# Patient Record
Sex: Male | Born: 1975 | Race: Black or African American | Hispanic: No | Marital: Single | Smoking: Current some day smoker
Health system: Southern US, Community
[De-identification: ages and names within clinical notes are randomized; demographics above are authoritative.]

## PROBLEM LIST (undated history)

## (undated) DIAGNOSIS — I10 Essential (primary) hypertension: Secondary | ICD-10-CM

## (undated) DIAGNOSIS — I499 Cardiac arrhythmia, unspecified: Secondary | ICD-10-CM

## (undated) DIAGNOSIS — N183 Chronic kidney disease, stage 3 unspecified: Secondary | ICD-10-CM

## (undated) DIAGNOSIS — F17200 Nicotine dependence, unspecified, uncomplicated: Secondary | ICD-10-CM

## (undated) DIAGNOSIS — F1410 Cocaine abuse, uncomplicated: Secondary | ICD-10-CM

## (undated) DIAGNOSIS — J45909 Unspecified asthma, uncomplicated: Secondary | ICD-10-CM

## (undated) DIAGNOSIS — I509 Heart failure, unspecified: Secondary | ICD-10-CM

## (undated) DIAGNOSIS — K5792 Diverticulitis of intestine, part unspecified, without perforation or abscess without bleeding: Secondary | ICD-10-CM

## (undated) HISTORY — DX: Cardiac arrhythmia, unspecified: I49.9

## (undated) HISTORY — DX: Heart failure, unspecified: I50.9

## (undated) HISTORY — PX: ANKLE SURGERY: SHX546

---

## 2003-10-22 ENCOUNTER — Emergency Department (HOSPITAL_COMMUNITY): Admission: EM | Admit: 2003-10-22 | Discharge: 2003-10-22 | Payer: Self-pay | Admitting: Emergency Medicine

## 2004-04-21 ENCOUNTER — Emergency Department (HOSPITAL_COMMUNITY): Admission: EM | Admit: 2004-04-21 | Discharge: 2004-04-21 | Payer: Self-pay | Admitting: Emergency Medicine

## 2004-05-25 ENCOUNTER — Other Ambulatory Visit: Payer: Self-pay

## 2004-05-25 ENCOUNTER — Emergency Department: Payer: Self-pay | Admitting: Emergency Medicine

## 2004-06-18 ENCOUNTER — Emergency Department (HOSPITAL_COMMUNITY): Admission: EM | Admit: 2004-06-18 | Discharge: 2004-06-18 | Payer: Self-pay | Admitting: *Deleted

## 2004-06-20 ENCOUNTER — Emergency Department (HOSPITAL_COMMUNITY): Admission: EM | Admit: 2004-06-20 | Discharge: 2004-06-20 | Payer: Self-pay | Admitting: Family Medicine

## 2004-08-22 ENCOUNTER — Emergency Department: Payer: Self-pay | Admitting: Emergency Medicine

## 2005-07-07 ENCOUNTER — Emergency Department: Payer: Self-pay | Admitting: Emergency Medicine

## 2006-01-06 ENCOUNTER — Inpatient Hospital Stay: Payer: Self-pay | Admitting: Internal Medicine

## 2007-12-09 ENCOUNTER — Emergency Department: Payer: Self-pay | Admitting: Emergency Medicine

## 2007-12-15 ENCOUNTER — Emergency Department: Payer: Self-pay | Admitting: Emergency Medicine

## 2007-12-24 ENCOUNTER — Ambulatory Visit: Payer: Self-pay | Admitting: Unknown Physician Specialty

## 2008-02-27 ENCOUNTER — Other Ambulatory Visit: Payer: Self-pay

## 2008-02-27 ENCOUNTER — Emergency Department: Payer: Self-pay | Admitting: Emergency Medicine

## 2008-09-26 ENCOUNTER — Emergency Department: Payer: Self-pay | Admitting: Internal Medicine

## 2009-01-12 ENCOUNTER — Emergency Department: Payer: Self-pay | Admitting: Unknown Physician Specialty

## 2009-06-04 ENCOUNTER — Emergency Department: Payer: Self-pay | Admitting: Emergency Medicine

## 2010-06-06 ENCOUNTER — Emergency Department: Payer: Self-pay | Admitting: Emergency Medicine

## 2010-06-08 ENCOUNTER — Emergency Department: Payer: Self-pay | Admitting: Emergency Medicine

## 2012-03-23 ENCOUNTER — Emergency Department: Payer: Self-pay | Admitting: Emergency Medicine

## 2012-03-23 LAB — URINALYSIS, COMPLETE
Bacteria: NONE SEEN
Bilirubin,UR: NEGATIVE
Blood: NEGATIVE
Glucose,UR: NEGATIVE mg/dL (ref 0–75)
Leukocyte Esterase: NEGATIVE
RBC,UR: 1 /HPF (ref 0–5)
Squamous Epithelial: <1

## 2014-09-02 ENCOUNTER — Emergency Department: Payer: Self-pay | Admitting: Emergency Medicine

## 2015-05-23 ENCOUNTER — Emergency Department
Admission: EM | Admit: 2015-05-23 | Discharge: 2015-05-23 | Disposition: A | Discharge status: Home / Self Care | Payer: Self-pay | Attending: Emergency Medicine | Admitting: Emergency Medicine

## 2015-05-23 ENCOUNTER — Encounter: Payer: Self-pay | Admitting: Emergency Medicine

## 2015-05-23 DIAGNOSIS — K5732 Diverticulitis of large intestine without perforation or abscess without bleeding: Secondary | ICD-10-CM | POA: Insufficient documentation

## 2015-05-23 DIAGNOSIS — Z720 Tobacco use: Secondary | ICD-10-CM | POA: Insufficient documentation

## 2015-05-23 HISTORY — DX: Diverticulitis of intestine, part unspecified, without perforation or abscess without bleeding: K57.92

## 2015-05-23 LAB — COMPREHENSIVE METABOLIC PANEL
ALBUMIN: 4.1 g/dL (ref 3.5–5.0)
ALK PHOS: 67 U/L (ref 38–126)
ALT: 15 U/L — AB (ref 17–63)
AST: 18 U/L (ref 15–41)
Anion gap: 9 (ref 5–15)
BUN: 15 mg/dL (ref 6–20)
CALCIUM: 9.2 mg/dL (ref 8.9–10.3)
CHLORIDE: 106 mmol/L (ref 101–111)
CO2: 24 mmol/L (ref 22–32)
CREATININE: 1.44 mg/dL — AB (ref 0.61–1.24)
GFR calc Af Amer: >60 mL/min (ref >60)
GFR calc non Af Amer: 60 mL/min — ABNORMAL LOW (ref >60)
GLUCOSE: 134 mg/dL — AB (ref 65–99)
Potassium: 4.3 mmol/L (ref 3.5–5.1)
SODIUM: 139 mmol/L (ref 135–145)
Total Bilirubin: 0.7 mg/dL (ref 0.3–1.2)
Total Protein: 7.4 g/dL (ref 6.5–8.1)

## 2015-05-23 LAB — LIPASE, BLOOD: LIPASE: 27 U/L (ref 22–51)

## 2015-05-23 LAB — CBC
HCT: 44.8 % (ref 40.0–52.0)
Hemoglobin: 14.7 g/dL (ref 13.0–18.0)
MCH: 26.9 pg (ref 26.0–34.0)
MCHC: 32.8 g/dL (ref 32.0–36.0)
MCV: 82 fL (ref 80.0–100.0)
PLATELETS: 232 10*3/uL (ref 150–440)
RBC: 5.47 MIL/uL (ref 4.40–5.90)
RDW: 14.7 % — ABNORMAL HIGH (ref 11.5–14.5)
WBC: 10.8 10*3/uL — ABNORMAL HIGH (ref 3.8–10.6)

## 2015-05-23 MED ORDER — CIPROFLOXACIN HCL 500 MG PO TABS
500.0000 mg | ORAL_TABLET | Freq: Two times a day (BID) | ORAL | Status: AC
Start: 2015-05-23 — End: 2015-06-02

## 2015-05-23 MED ORDER — OXYCODONE-ACETAMINOPHEN 5-325 MG PO TABS: 2.0000 | ORAL_TABLET | Freq: Four times a day (QID) | ORAL | Status: DC | PRN

## 2015-05-23 MED ORDER — METRONIDAZOLE 500 MG PO TABS
ORAL_TABLET | ORAL | Status: AC
  Administered 2015-05-23: 500 mg via ORAL
  Filled 2015-05-23: qty 1

## 2015-05-23 MED ORDER — CIPROFLOXACIN HCL 500 MG PO TABS
500.0000 mg | ORAL_TABLET | Freq: Once | ORAL | Status: AC
  Administered 2015-05-23: 500 mg via ORAL
  Filled 2015-05-23: qty 1

## 2015-05-23 MED ORDER — METRONIDAZOLE 500 MG PO TABS
500.0000 mg | ORAL_TABLET | Freq: Once | ORAL | Status: AC
  Administered 2015-05-23: 500 mg via ORAL

## 2015-05-23 MED ORDER — KETOROLAC TROMETHAMINE 60 MG/2ML IM SOLN
INTRAMUSCULAR | Status: AC
  Administered 2015-05-23: 60 mg via INTRAMUSCULAR
  Filled 2015-05-23: qty 2

## 2015-05-23 MED ORDER — KETOROLAC TROMETHAMINE 60 MG/2ML IM SOLN
60.0000 mg | Freq: Once | INTRAMUSCULAR | Status: AC
  Administered 2015-05-23: 60 mg via INTRAMUSCULAR

## 2015-05-23 MED ORDER — METRONIDAZOLE 500 MG PO TABS: 500.0000 mg | ORAL_TABLET | Freq: Two times a day (BID) | ORAL | Status: DC

## 2015-05-23 NOTE — ED Notes (Signed)
Pt c/o LLQ pain for two days now, states it is consistent with his diverticulitis pain. Denies N, V, D.

## 2015-05-23 NOTE — Discharge Instructions (Signed)

## 2015-05-23 NOTE — ED Provider Notes (Signed)
[facility] Emergency Department Provider Note     Time seen: ----------------------------------------- 3:58 PM on 05/23/2015 -----------------------------------------    I have reviewed the triage vital signs and the nursing notes.   HISTORY  Chief Complaint Abdominal Pain    HPI Brad [name] is a 39 y.o. male who presents ER with dull left lower quadrant pain for last 2 days. Nothing makes his symptoms worse. Patient states this is consistent with his previous diverticulitis, denies any fevers, chills, nausea vomiting or diarrhea. Patient is not had blood in his stool.   Past Medical History  Diagnosis Date  . Diverticulitis     There are no active problems to display for this patient.   Past Surgical History  Procedure Laterality Date  . Ankle surgery      Allergies Review of patient's allergies indicates no known allergies.  Social History Social History  Substance Use Topics  . Smoking status: Current Every Day Smoker -- 0.50 packs/day    Types: Cigarettes  . Smokeless tobacco: None  . Alcohol Use: Yes     Comment: occas    Review of Systems Constitutional: Negative for fever. Eyes: Negative for visual changes. ENT: Negative for sore throat. Cardiovascular: Negative for chest pain. Respiratory: Negative for shortness of breath. Gastrointestinal: Positive for abdominal pain Genitourinary: Negative for dysuria. Musculoskeletal: Negative for back pain. Skin: Negative for rash. Neurological: Negative for headaches, focal weakness or numbness.  10-point ROS otherwise negative.  ____________________________________________   PHYSICAL EXAM:  VITAL SIGNS: ED Triage Vitals  Enc Vitals Group     BP 05/23/15 1256 161/91 mmHg     Pulse Rate 05/23/15 1256 89     Resp 05/23/15 1256 17     Temp 05/23/15 1256 98.3 F (36.8 C)     Temp Source 05/23/15 1256 Oral     SpO2 05/23/15 1256 96 %     Weight 05/23/15 1256 260 lb  (117.935 kg)     Height 05/23/15 1256  (1.651 m)     Head Cir --      Peak Flow --      Pain Score 05/23/15 1301 6     Pain Loc --      Pain Edu? --      Excl. in GC? --     Constitutional: Alert and oriented. Well appearing and in no distress. Eyes: Conjunctivae are normal. PERRL. Normal extraocular movements. ENT   Head: Normocephalic and atraumatic.   Nose: No congestion/rhinnorhea.   Mouth/Throat: Mucous membranes are moist.   Neck: No stridor. Cardiovascular: Normal rate, regular rhythm. Normal and symmetric distal pulses are present in all extremities. No murmurs, rubs, or gallops. Respiratory: Normal respiratory effort without tachypnea nor retractions. Breath sounds are clear and equal bilaterally. No wheezes/rales/rhonchi. Gastrointestinal: The moderate left flank and left lower quadrant tenderness, no rebound or guarding. Normal bowel sounds. Musculoskeletal: Nontender with normal range of motion in all extremities. No joint effusions.  No lower extremity tenderness nor edema. Neurologic:  Normal speech and language. No gross focal neurologic deficits are appreciated. Speech is normal. No gait instability. Skin:  Skin is warm, dry and intact. No rash noted. Psychiatric: Mood and affect are normal. Speech and behavior are normal. Patient exhibits appropriate insight and judgment.  ____________________________________________  ED COURSE:  Pertinent labs & imaging results that were available during my care of the patient were reviewed by me and considered in my medical decision making (see chart for details). Patient is in  no acute distress, likely mild diverticulitis. He'll receive Cipro Flagyl and pain medication. Is encouraged to have close follow-up with his doctor for reevaluation ____________________________________________    LABS (pertinent positives/negatives)  Labs Reviewed  COMPREHENSIVE METABOLIC PANEL - Abnormal; Notable for the following:     Glucose, Bld 134 (*)    Creatinine, Ser 1.44 (*)    ALT 15 (*)    GFR calc non Af Amer 60 (*)    All other components within normal limits  CBC - Abnormal; Notable for the following:    WBC 10.8 (*)    RDW 14.7 (*)    All other components within normal limits  LIPASE, BLOOD  URINALYSIS COMPLETEWITH MICROSCOPIC ([facility] ONLY)    ____________________________________________  FINAL ASSESSMENT AND PLAN  Diverticulitis  Plan: Patient with labs and imaging as dictated above. Patient with very mild leukocytosis, has been started on Cipro and Flagyl here. He was given Toradol IM for pain. He is discharged with Cipro Flagyl and Percocet.   Emily Filbert, MD   Emily Filbert, MD 05/23/15 1600

## 2015-05-29 ENCOUNTER — Emergency Department
Admission: EM | Admit: 2015-05-29 | Discharge: 2015-05-29 | Disposition: A | Discharge status: Home / Self Care | Payer: Self-pay | Attending: Emergency Medicine | Admitting: Emergency Medicine

## 2015-05-29 ENCOUNTER — Emergency Department: Payer: Self-pay

## 2015-05-29 DIAGNOSIS — Z720 Tobacco use: Secondary | ICD-10-CM | POA: Insufficient documentation

## 2015-05-29 DIAGNOSIS — K5732 Diverticulitis of large intestine without perforation or abscess without bleeding: Secondary | ICD-10-CM | POA: Insufficient documentation

## 2015-05-29 LAB — URINALYSIS COMPLETE WITH MICROSCOPIC (ARMC ONLY)
BILIRUBIN URINE: NEGATIVE
Bacteria, UA: NONE SEEN
GLUCOSE, UA: NEGATIVE mg/dL
HGB URINE DIPSTICK: NEGATIVE
Leukocytes, UA: NEGATIVE
NITRITE: NEGATIVE
Protein, ur: 100 mg/dL — AB
RBC / HPF: NONE SEEN RBC/hpf (ref 0–5)
Specific Gravity, Urine: 1.021 (ref 1.005–1.030)
Squamous Epithelial / LPF: NONE SEEN
pH: 8 (ref 5.0–8.0)

## 2015-05-29 LAB — COMPREHENSIVE METABOLIC PANEL
ALK PHOS: 61 U/L (ref 38–126)
ALT: 13 U/L — ABNORMAL LOW (ref 17–63)
ANION GAP: 10 (ref 5–15)
AST: 19 U/L (ref 15–41)
Albumin: 4.4 g/dL (ref 3.5–5.0)
BILIRUBIN TOTAL: 0.7 mg/dL (ref 0.3–1.2)
BUN: 16 mg/dL (ref 6–20)
CALCIUM: 9.5 mg/dL (ref 8.9–10.3)
CO2: 25 mmol/L (ref 22–32)
Chloride: 105 mmol/L (ref 101–111)
Creatinine, Ser: 1.51 mg/dL — ABNORMAL HIGH (ref 0.61–1.24)
GFR calc non Af Amer: 57 mL/min — ABNORMAL LOW (ref >60)
Glucose, Bld: 134 mg/dL — ABNORMAL HIGH (ref 65–99)
Potassium: 3.7 mmol/L (ref 3.5–5.1)
Sodium: 140 mmol/L (ref 135–145)
TOTAL PROTEIN: 8.2 g/dL — AB (ref 6.5–8.1)

## 2015-05-29 LAB — CBC
HCT: 47.3 % (ref 40.0–52.0)
HEMOGLOBIN: 15.3 g/dL (ref 13.0–18.0)
MCH: 26.7 pg (ref 26.0–34.0)
MCHC: 32.5 g/dL (ref 32.0–36.0)
MCV: 82.1 fL (ref 80.0–100.0)
Platelets: 288 10*3/uL (ref 150–440)
RBC: 5.76 MIL/uL (ref 4.40–5.90)
RDW: 14.8 % — ABNORMAL HIGH (ref 11.5–14.5)
WBC: 8.2 10*3/uL (ref 3.8–10.6)

## 2015-05-29 LAB — LIPASE, BLOOD: Lipase: 19 U/L — ABNORMAL LOW (ref 22–51)

## 2015-05-29 MED ORDER — SODIUM CHLORIDE 0.9 % IV SOLN
Freq: Once | INTRAVENOUS | Status: AC
  Administered 2015-05-29: 18:00:00 via INTRAVENOUS

## 2015-05-29 MED ORDER — CIPROFLOXACIN HCL 500 MG PO TABS: 500.0000 mg | ORAL_TABLET | Freq: Two times a day (BID) | ORAL | Status: AC

## 2015-05-29 MED ORDER — ONDANSETRON HCL 4 MG/2ML IJ SOLN
4.0000 mg | Freq: Once | INTRAMUSCULAR | Status: AC
  Administered 2015-05-29: 4 mg via INTRAVENOUS

## 2015-05-29 MED ORDER — HYDROMORPHONE HCL 1 MG/ML IJ SOLN: 1.0000 mg | Freq: Once | INTRAMUSCULAR | Status: DC

## 2015-05-29 MED ORDER — HYDROMORPHONE HCL 1 MG/ML IJ SOLN
1.0000 mg | Freq: Once | INTRAMUSCULAR | Status: AC
  Administered 2015-05-29: 1 mg via INTRAVENOUS

## 2015-05-29 MED ORDER — CIPROFLOXACIN HCL 500 MG PO TABS
500.0000 mg | ORAL_TABLET | Freq: Once | ORAL | Status: AC
  Administered 2015-05-29: 500 mg via ORAL
  Filled 2015-05-29: qty 1

## 2015-05-29 MED ORDER — HYDROMORPHONE HCL 1 MG/ML IJ SOLN
INTRAMUSCULAR | Status: AC
  Administered 2015-05-29: 1 mg via INTRAVENOUS
  Filled 2015-05-29: qty 1

## 2015-05-29 MED ORDER — METRONIDAZOLE 500 MG PO TABS: 500.0000 mg | ORAL_TABLET | Freq: Two times a day (BID) | ORAL | Status: DC

## 2015-05-29 MED ORDER — METRONIDAZOLE 500 MG PO TABS
500.0000 mg | ORAL_TABLET | Freq: Once | ORAL | Status: AC
  Administered 2015-05-29: 500 mg via ORAL
  Filled 2015-05-29: qty 1

## 2015-05-29 MED ORDER — OXYCODONE-ACETAMINOPHEN 5-325 MG PO TABS: 2.0000 | ORAL_TABLET | Freq: Four times a day (QID) | ORAL | Status: DC | PRN

## 2015-05-29 MED ORDER — ONDANSETRON HCL 4 MG/2ML IJ SOLN
INTRAMUSCULAR | Status: AC
  Filled 2015-05-29: qty 2

## 2015-05-29 NOTE — Discharge Instructions (Signed)
Diverticulitis °Diverticulitis is inflammation or infection of small pouches in your colon that form when you have a condition called diverticulosis. The pouches in your colon are called diverticula. Your colon, or large intestine, is where water is absorbed and stool is formed. °Complications of diverticulitis can include: °· Bleeding. °· Severe infection. °· Severe pain. °· Perforation of your colon. °· Obstruction of your colon. °CAUSES  °Diverticulitis is caused by bacteria. °Diverticulitis happens when stool becomes trapped in diverticula. This allows bacteria to grow in the diverticula, which can lead to inflammation and infection. °RISK FACTORS °People with diverticulosis are at risk for diverticulitis. Eating a diet that does not include enough fiber from fruits and vegetables may make diverticulitis more likely to develop. °SYMPTOMS  °Symptoms of diverticulitis may include: °· Abdominal pain and tenderness. The pain is normally located on the left side of the abdomen, but may occur in other areas. °· Fever and chills. °· Bloating. °· Cramping. °· Nausea. °· Vomiting. °· Constipation. °· Diarrhea. °· Blood in your stool. °DIAGNOSIS  °Your health care provider will ask you about your medical history and do a physical exam. You may need to have tests done because many medical conditions can cause the same symptoms as diverticulitis. Tests may include: °· Blood tests. °· Urine tests. °· Imaging tests of the abdomen, including X-rays and CT scans. °When your condition is under control, your health care provider may recommend that you have a colonoscopy. A colonoscopy can show how severe your diverticula are and whether something else is causing your symptoms. °TREATMENT  °Most cases of diverticulitis are mild and can be treated at home. Treatment may include: °· Taking over-the-counter pain medicines. °· Following a clear liquid diet. °· Taking antibiotic medicines by mouth for 7-10 days. °More severe cases may  be treated at a hospital. Treatment may include: °· Not eating or drinking. °· Taking prescription pain medicine. °· Receiving antibiotic medicines through an IV tube. °· Receiving fluids and nutrition through an IV tube. °· Surgery. °HOME CARE INSTRUCTIONS  °· Follow your health care provider's instructions carefully. °· Follow a full liquid diet or other diet as directed by your health care provider. After your symptoms improve, your health care provider may tell you to change your diet. He or she may recommend you eat a high-fiber diet. Fruits and vegetables are good sources of fiber. Fiber makes it easier to pass stool. °· Take fiber supplements or probiotics as directed by your health care provider. °· Only take medicines as directed by your health care provider. °· Keep all your follow-up appointments. °SEEK MEDICAL CARE IF:  °· Your pain does not improve. °· You have a hard time eating food. °· Your bowel movements do not return to normal. °SEEK IMMEDIATE MEDICAL CARE IF:  °· Your pain becomes worse. °· Your symptoms do not get better. °· Your symptoms suddenly get worse. °· You have a fever. °· You have repeated vomiting. °· You have bloody or black, tarry stools. °MAKE SURE YOU:  °· Understand these instructions. °· Will watch your condition. °· Will get help right away if you are not doing well or get worse. °  °This information is not intended to replace advice given to you by your health care provider. Make sure you discuss any questions you have with your health care provider. °  °Document Released: 05/21/2005 Document Revised: 08/16/2013 Document Reviewed: 07/06/2013 °Elsevier Interactive Patient Education ©2016 [employer] ° °

## 2015-05-29 NOTE — ED Notes (Signed)
Pt c/o LLQ pain since last week and was dx with diverticulitis and rx abx and pain meds, states he was only able to afford the pain meds and will pick up the abx tomorrow..states the pain has worsened with N/V since last night.

## 2015-05-29 NOTE — ED Provider Notes (Signed)
[facility] Emergency Department Provider Note     Time seen: ----------------------------------------- 5:42 PM on 05/29/2015 -----------------------------------------    I have reviewed the triage vital signs and the nursing notes.   HISTORY  Chief Complaint Abdominal Pain    HPI Brad [name] is a 39 y.o. male who presents to ER with left flank pain. Patient was diagnosed by me about a week ago with diverticulitis. He was prescribed antibiotics and pain medication. Patient states he couldn't afford the antibiotics because there were $35, so he fill the pain medication. Patient states he was given a pickup antibiotics tomorrow, states pain worsened last night with nausea and vomiting.   Past Medical History  Diagnosis Date  . Diverticulitis     There are no active problems to display for this patient.   Past Surgical History  Procedure Laterality Date  . Ankle surgery      Allergies Review of patient's allergies indicates no known allergies.  Social History Social History  Substance Use Topics  . Smoking status: Current Every Day Smoker -- 0.50 packs/day    Types: Cigarettes  . Smokeless tobacco: None  . Alcohol Use: Yes     Comment: occas    Review of Systems Constitutional: Negative for fever. Eyes: Negative for visual changes. ENT: Negative for sore throat. Cardiovascular: Negative for chest pain. Respiratory: Negative for shortness of breath. Gastrointestinal: Positive for abdominal pain and vomiting Genitourinary: Negative for dysuria. Musculoskeletal: Negative for back pain. Skin: Negative for rash. Neurological: Negative for headaches, focal weakness or numbness.  10-point ROS otherwise negative.  ____________________________________________   PHYSICAL EXAM:  VITAL SIGNS: ED Triage Vitals  Enc Vitals Group     BP 05/29/15 1640 204/104 mmHg     Pulse Rate 05/29/15 1640 82     Resp 05/29/15 1640 18     Temp  05/29/15 1640 98.1 F (36.7 C)     Temp Source 05/29/15 1640 Oral     SpO2 05/29/15 1640 97 %     Weight 05/29/15 1640 260 lb (117.935 kg)     Height 05/29/15 1640  (1.753 m)     Head Cir --      Peak Flow --      Pain Score 05/29/15 1641 10     Pain Loc --      Pain Edu? --      Excl. in GC? --     Constitutional: Alert and oriented. Well appearing and in no distress. Eyes: Conjunctivae are normal. PERRL. Normal extraocular movements. ENT   Head: Normocephalic and atraumatic.   Nose: No congestion/rhinnorhea.   Mouth/Throat: Mucous membranes are moist.   Neck: No stridor. Cardiovascular: Normal rate, regular rhythm. Normal and symmetric distal pulses are present in all extremities. No murmurs, rubs, or gallops. Respiratory: Normal respiratory effort without tachypnea nor retractions. Breath sounds are clear and equal bilaterally. No wheezes/rales/rhonchi. Gastrointestinal: Soft and nontender. No distention. No abdominal bruits.  Musculoskeletal: Nontender with normal range of motion in all extremities. No joint effusions.  No lower extremity tenderness nor edema. Neurologic:  Normal speech and language. No gross focal neurologic deficits are appreciated. Speech is normal. No gait instability. Skin:  Skin is warm, dry and intact. No rash noted. Psychiatric: Mood and affect are normal. Speech and behavior are normal. Patient exhibits appropriate insight and judgment.  ____________________________________________  ED COURSE:  Pertinent labs & imaging results that were available during my care of the patient were reviewed by me and  considered in my medical decision making (see chart for details). We'll check basic abdominal labs, consider imaging at this point. He'll receive pain medication as well. ____________________________________________    LABS (pertinent positives/negatives)  Labs Reviewed  LIPASE, BLOOD - Abnormal; Notable for the following:    Lipase  19 (*)    All other components within normal limits  COMPREHENSIVE METABOLIC PANEL - Abnormal; Notable for the following:    Glucose, Bld 134 (*)    Creatinine, Ser 1.51 (*)    Total Protein 8.2 (*)    ALT 13 (*)    GFR calc non Af Amer 57 (*)    All other components within normal limits  CBC - Abnormal; Notable for the following:    RDW 14.8 (*)    All other components within normal limits  URINALYSIS COMPLETEWITH MICROSCOPIC ([facility] ONLY) - Abnormal; Notable for the following:    Color, Urine YELLOW (*)    APPearance CLOUDY (*)    Ketones, ur TRACE (*)    Protein, ur 100 (*)    All other components within normal limits    RADIOLOGY Images were viewed by me  CT renal protocol IMPRESSION: Descending colon diverticulitis. ____________________________________________  FINAL ASSESSMENT AND PLAN  Flank pain, diverticulitis  Plan: Patient with labs and imaging as dictated above. Patient will be represcribed Cipro and Flagyl. He was given his first dose tonight. Be discharged with Cipro Flagyl and Percocet and advised to follow-up with not improving.   Emily Filbert, MD   Emily Filbert, MD 05/29/15 [phone]

## 2015-05-30 ENCOUNTER — Inpatient Hospital Stay
Admission: EM | Admit: 2015-05-30 | Discharge: 2015-05-31 | DRG: 894 | Discharge status: Against Medical Advice | Payer: Self-pay | Attending: Internal Medicine | Admitting: Internal Medicine

## 2015-05-30 ENCOUNTER — Encounter: Payer: Self-pay | Admitting: *Deleted

## 2015-05-30 DIAGNOSIS — Y929 Unspecified place or not applicable: Secondary | ICD-10-CM

## 2015-05-30 DIAGNOSIS — I129 Hypertensive chronic kidney disease with stage 1 through stage 4 chronic kidney disease, or unspecified chronic kidney disease: Secondary | ICD-10-CM | POA: Diagnosis present

## 2015-05-30 DIAGNOSIS — D72829 Elevated white blood cell count, unspecified: Secondary | ICD-10-CM | POA: Diagnosis present

## 2015-05-30 DIAGNOSIS — T40605A Adverse effect of unspecified narcotics, initial encounter: Secondary | ICD-10-CM | POA: Diagnosis present

## 2015-05-30 DIAGNOSIS — F1590 Other stimulant use, unspecified, uncomplicated: Secondary | ICD-10-CM | POA: Diagnosis present

## 2015-05-30 DIAGNOSIS — N19 Unspecified kidney failure: Secondary | ICD-10-CM

## 2015-05-30 DIAGNOSIS — F1410 Cocaine abuse, uncomplicated: Secondary | ICD-10-CM | POA: Diagnosis present

## 2015-05-30 DIAGNOSIS — K5732 Diverticulitis of large intestine without perforation or abscess without bleeding: Secondary | ICD-10-CM | POA: Diagnosis present

## 2015-05-30 DIAGNOSIS — N179 Acute kidney failure, unspecified: Secondary | ICD-10-CM | POA: Diagnosis present

## 2015-05-30 DIAGNOSIS — F1123 Opioid dependence with withdrawal: Principal | ICD-10-CM | POA: Diagnosis present

## 2015-05-30 DIAGNOSIS — I159 Secondary hypertension, unspecified: Secondary | ICD-10-CM

## 2015-05-30 DIAGNOSIS — R112 Nausea with vomiting, unspecified: Secondary | ICD-10-CM | POA: Diagnosis present

## 2015-05-30 DIAGNOSIS — K5903 Drug induced constipation: Secondary | ICD-10-CM | POA: Diagnosis present

## 2015-05-30 DIAGNOSIS — R109 Unspecified abdominal pain: Secondary | ICD-10-CM

## 2015-05-30 DIAGNOSIS — J45909 Unspecified asthma, uncomplicated: Secondary | ICD-10-CM | POA: Diagnosis present

## 2015-05-30 DIAGNOSIS — F17210 Nicotine dependence, cigarettes, uncomplicated: Secondary | ICD-10-CM | POA: Diagnosis present

## 2015-05-30 DIAGNOSIS — G253 Myoclonus: Secondary | ICD-10-CM | POA: Diagnosis present

## 2015-05-30 LAB — CBC WITH DIFFERENTIAL/PLATELET
BASOS ABS: 0.1 10*3/uL (ref 0–0.1)
BASOS PCT: 1 %
Eosinophils Absolute: 0 10*3/uL (ref 0–0.7)
Eosinophils Relative: 0 %
HEMATOCRIT: 48.9 % (ref 40.0–52.0)
HEMOGLOBIN: 15.6 g/dL (ref 13.0–18.0)
Lymphocytes Relative: 13 %
Lymphs Abs: 1.7 10*3/uL (ref 1.0–3.6)
MCH: 26.3 pg (ref 26.0–34.0)
MCHC: 31.9 g/dL — ABNORMAL LOW (ref 32.0–36.0)
MCV: 82.5 fL (ref 80.0–100.0)
Monocytes Absolute: 0.7 10*3/uL (ref 0.2–1.0)
Monocytes Relative: 6 %
NEUTROS ABS: 10 10*3/uL — AB (ref 1.4–6.5)
NEUTROS PCT: 80 %
Platelets: 285 10*3/uL (ref 150–440)
RBC: 5.94 MIL/uL — ABNORMAL HIGH (ref 4.40–5.90)
RDW: 14.7 % — ABNORMAL HIGH (ref 11.5–14.5)
WBC: 12.5 10*3/uL — ABNORMAL HIGH (ref 3.8–10.6)

## 2015-05-30 LAB — COMPREHENSIVE METABOLIC PANEL
ALK PHOS: 66 U/L (ref 38–126)
ALT: 12 U/L — ABNORMAL LOW (ref 17–63)
ANION GAP: 8 (ref 5–15)
AST: 16 U/L (ref 15–41)
Albumin: 4.1 g/dL (ref 3.5–5.0)
BILIRUBIN TOTAL: 1 mg/dL (ref 0.3–1.2)
BUN: 12 mg/dL (ref 6–20)
CALCIUM: 8.8 mg/dL — AB (ref 8.9–10.3)
CO2: 25 mmol/L (ref 22–32)
Chloride: 103 mmol/L (ref 101–111)
Creatinine, Ser: 1.42 mg/dL — ABNORMAL HIGH (ref 0.61–1.24)
GFR calc non Af Amer: >60 mL/min (ref >60)
Glucose, Bld: 117 mg/dL — ABNORMAL HIGH (ref 65–99)
Potassium: 3.9 mmol/L (ref 3.5–5.1)
SODIUM: 136 mmol/L (ref 135–145)
TOTAL PROTEIN: 8 g/dL (ref 6.5–8.1)

## 2015-05-30 LAB — TROPONIN I: Troponin I: <0.03 ng/mL (ref <0.031)

## 2015-05-30 MED ORDER — PROMETHAZINE HCL 25 MG PO TABS: 25.0000 mg | ORAL_TABLET | Freq: Four times a day (QID) | ORAL | Status: DC | PRN

## 2015-05-30 MED ORDER — CLONIDINE HCL 0.1 MG PO TABS
ORAL_TABLET | ORAL | Status: AC
  Administered 2015-05-30: 0.1 mg via ORAL
  Filled 2015-05-30: qty 1

## 2015-05-30 MED ORDER — CLONIDINE HCL 0.1 MG PO TABS
0.1000 mg | ORAL_TABLET | Freq: Once | ORAL | Status: AC
  Administered 2015-05-30: 0.1 mg via ORAL

## 2015-05-30 MED ORDER — ONDANSETRON HCL 4 MG/2ML IJ SOLN
4.0000 mg | Freq: Once | INTRAMUSCULAR | Status: AC
  Administered 2015-05-30: 4 mg via INTRAVENOUS
  Filled 2015-05-30: qty 2

## 2015-05-30 MED ORDER — ONDANSETRON 8 MG PO TBDP
8.0000 mg | ORAL_TABLET | Freq: Once | ORAL | Status: AC
  Administered 2015-05-30: 8 mg via ORAL

## 2015-05-30 MED ORDER — PROMETHAZINE HCL 25 MG/ML IJ SOLN
INTRAMUSCULAR | Status: AC
  Administered 2015-05-30: 12.5 mg via INTRAVENOUS
  Filled 2015-05-30: qty 1

## 2015-05-30 MED ORDER — CLONIDINE HCL 0.1 MG PO TABS
ORAL_TABLET | ORAL | Status: AC
Start: 2015-05-30 — End: 2015-05-30
  Filled 2015-05-30: qty 1

## 2015-05-30 MED ORDER — ACETAMINOPHEN 500 MG PO TABS
ORAL_TABLET | ORAL | Status: AC
  Filled 2015-05-30: qty 2

## 2015-05-30 MED ORDER — ONDANSETRON HCL 4 MG PO TABS: 4.0000 mg | ORAL_TABLET | Freq: Every day | ORAL | Status: DC | PRN

## 2015-05-30 MED ORDER — PROMETHAZINE HCL 25 MG/ML IJ SOLN
12.5000 mg | Freq: Once | INTRAMUSCULAR | Status: AC
  Administered 2015-05-30: 12.5 mg via INTRAVENOUS

## 2015-05-30 MED ORDER — ONDANSETRON 8 MG PO TBDP
ORAL_TABLET | ORAL | Status: AC
  Administered 2015-05-30: 8 mg via ORAL
  Filled 2015-05-30: qty 1

## 2015-05-30 MED ORDER — SODIUM CHLORIDE 0.9 % IV BOLUS (SEPSIS)
1000.0000 mL | Freq: Once | INTRAVENOUS | Status: AC
Start: 2015-05-30 — End: 2015-05-30
  Administered 2015-05-30: 1000 mL via INTRAVENOUS

## 2015-05-30 MED ORDER — ACETAMINOPHEN 500 MG PO TABS
1000.0000 mg | ORAL_TABLET | Freq: Once | ORAL | Status: AC
  Administered 2015-05-30: 1000 mg via ORAL
  Filled 2015-05-30: qty 2

## 2015-05-30 NOTE — ED Notes (Signed)
Pt was seen here yesterday and dx with diverticulitis.  Pt worse today.  Pt has vomiting and increased abd pain.  Pt diaphoretic.

## 2015-05-30 NOTE — ED Notes (Signed)
Pt states he still feels nauseated. Pt took a few sips of water. Pt states he doesn't feel like he is going to vomit at this time.

## 2015-05-30 NOTE — ED Provider Notes (Signed)
[facility] Emergency Department Provider Note  ____________________________________________  Time seen: 1915  I have reviewed the triage vital signs and the nursing notes.   HISTORY  Chief Complaint Abdominal Pain and Emesis     HPI Brad [name] is a 39 y.o. male who is been seen in the emergency department twice over the past week. He was initially diagnosedclinically with diverticulitis. He was discharged with prescription for antibiotics but he did not fill it. He returned yesterday with ongoing symptoms and he underwent a CT scan which confirmed the diagnosis of diverticulitis in the descending colon. He was again written prescriptions for antibiotics and he reports he fill them and is taking those medications - Cipro and Flagyl. He took some this morning. He reports that since taking the medications he has felt nauseous. He has had a few episodes of emesis. He denies pain in his abdomen. He does report that he feels hot at times. He has no noted fever on arrival.   Past Medical History  Diagnosis Date  . Diverticulitis     There are no active problems to display for this patient.   Past Surgical History  Procedure Laterality Date  . Ankle surgery      Current Outpatient Rx  Name  Route  Sig  Dispense  Refill  . ciprofloxacin (CIPRO) 500 MG tablet   Oral   Take 1 tablet (500 mg total) by mouth 2 (two) times daily.   20 tablet   0   . ciprofloxacin (CIPRO) 500 MG tablet   Oral   Take 1 tablet (500 mg total) by mouth 2 (two) times daily.   20 tablet   0   . metroNIDAZOLE (FLAGYL) 500 MG tablet   Oral   Take 1 tablet (500 mg total) by mouth 2 (two) times daily.   20 tablet   0   . metroNIDAZOLE (FLAGYL) 500 MG tablet   Oral   Take 1 tablet (500 mg total) by mouth 2 (two) times daily.   20 tablet   0   . ondansetron (ZOFRAN) 4 MG tablet   Oral   Take 1 tablet (4 mg total) by mouth daily as needed for nausea or vomiting.   10  tablet   0   . oxyCODONE-acetaminophen (PERCOCET) 5-325 MG tablet   Oral   Take 2 tablets by mouth every 6 (six) hours as needed for moderate pain or severe pain.   20 tablet   0   . oxyCODONE-acetaminophen (PERCOCET) 5-325 MG tablet   Oral   Take 2 tablets by mouth every 6 (six) hours as needed for moderate pain or severe pain.   20 tablet   0   . promethazine (PHENERGAN) 25 MG tablet   Oral   Take 1 tablet (25 mg total) by mouth every 6 (six) hours as needed for nausea or vomiting.   8 tablet   0     Allergies Review of patient's allergies indicates no known allergies.  No family history on file.  Social History Social History  Substance Use Topics  . Smoking status: Current Every Day Smoker -- 0.50 packs/day    Types: Cigarettes  . Smokeless tobacco: None  . Alcohol Use: Yes     Comment: occas    Review of Systems  Constitutional: Negative for fever. ENT: Negative for sore throat. Cardiovascular: Negative for chest pain. Respiratory: Negative for cough. Gastrointestinal: Noted current diverticulitis. Current nausea with emesis today. Genitourinary: Negative for dysuria.  Musculoskeletal: No myalgias or injuries. Skin: Negative for rash. Neurological: Negative for paresthesia or weakness   10-point ROS otherwise negative.  ____________________________________________   PHYSICAL EXAM:  VITAL SIGNS: ED Triage Vitals  Enc Vitals Group     BP 05/30/15 1904 190/121 mmHg     Pulse Rate 05/30/15 1904 81     Resp 05/30/15 1904 30     Temp 05/30/15 1904 98.3 F (36.8 C)     Temp Source 05/30/15 1904 Oral     SpO2 05/30/15 1904 99 %     Weight 05/30/15 1904 260 lb (117.935 kg)     Height 05/30/15 1904 5\' 5"  (1.651 m)     Head Cir --      Peak Flow --      Pain Score 05/30/15 1905 10     Pain Loc --      Pain Edu? --      Excl. in GC? --     Constitutional:  Alert and oriented. Appears fatigued, low energy, but is communicative and in no acute  distress. ENT   Head: Normocephalic and atraumatic.   Nose: No congestion/rhinnorhea.    Cardiovascular: Normal rate, regular rhythm, no murmur noted Respiratory:  Normal respiratory effort, no tachypnea.    Breath sounds are clear and equal bilaterally.  Gastrointestinal: Soft and nontender on exam. With further discussion and further exam, we find T is tender in the most lateral portion of his left abdomen. No distention.  Musculoskeletal: No deformity noted. Nontender with normal range of motion in all extremities.  No noted edema. Neurologic:  Normal speech and language. No gross focal neurologic deficits are appreciated.  Skin:  Skin is warm, dry. No rash noted. Psychiatric: Mood and affect are normal. Speech and behavior are normal.  ____________________________________________    LABS (pertinent positives/negatives)   recent lab work reviewed  ____________________________________________    RADIOLOGY  CT of abdomen and pelvis from yesterday reviewed  ____________________________________________   INITIAL IMPRESSION / ASSESSMENT AND PLAN / ED COURSE  Pertinent labs & imaging results that were available during my care of the patient were reviewed by me and considered in my medical decision making (see chart for details).  The patient is not complaining of abdominal pain, but rather nausea with emesis. We will treat him with 8 mg of Zofran ODT and 1000 g of Tylenol due to the feverish symptoms he has. If he rapidly improves with this treatment in the emergency department we will discharge him home with a prescription for additional Zofran tablets.   ----------------------------------------- 8:12 PM on 05/30/2015 -----------------------------------------  Patient's blood pressure is noted to be elevated. We will treat him with clonidine 0.1 mg. The patient declined the Tylenol that I was prescribing for his fever symptoms. On reexamination, the patient reports his  nausea has improved some. He still looks fatigued and he is now diaphoretic. I have encouraged him to take the Tylenol. At this time he agrees.  Discomfort is understandably concerned about him. She is eager for him to be admitted to the hospital. At this particular time, I do not see an indication for admission. His CT scan yesterday did not show perforation, his white blood cell count was not significantly elevated, and he just began his treatment for his diverticulitis. We will continue to attempt to control his symptoms, and if we are able to, we will discharge him home.  ----------------------------------------- 9:47 PM on 05/30/2015 -----------------------------------------  The patient did have an additional episode of  emesis in the emergency department. He has since been able to drink water. The diaphoresis mentioned earlier has resolved. His blood pressure has eased down significantly to 140/75. This change in blood pressure may have induced the nausea and vomiting. We will give him IV fluids to help him feel better.  I've had a pleasant and lengthy conversation with his girlfriend regarding his medications. His mother is concerned that the bite her milligrams of Cipro and 500 mg of metronidazole, adding up to 1000 mg of medication, is too much for the patient at one time. I tried to explain that these are the appropriate doses for these medications, but I also suggested that if they would like to stagger the dose of Cipro and metronidazole they may do so.  ----------------------------------------- 10:49 PM on 05/30/2015 -----------------------------------------  On reexamination at this time, the patient reports he continues to be nauseous. He is received 1 L of normal saline.  I will treat him with Phenergan, 12.5 mg IV.  If we are not able to reverse his refractory nausea, we will consider admitting him to the hospital.   I had asked for an EKG approximate hour ago, but it has not been  done yet. It is being expedited now. Because of his refractory nausea, we will add blood tests on at this time. Earlier, given his recent lab work, I did not feel any was indicated.  ----------------------------------------- 11:04 PM on 05/30/2015 -----------------------------------------  EKG shows flipped T waves in the lateral leads. I have ordered a troponin along with basic blood work. This is pending at this time. I will last my colleague Dr. Dolores Frame to follow-up the results and further evaluate the patient for his refractory nausea.  ____________________________________________   FINAL CLINICAL IMPRESSION(S) / ED DIAGNOSES  Final diagnoses:  Non-intractable vomiting with nausea, vomiting of unspecified type  Diverticulitis of large intestine without perforation or abscess without bleeding      Darien Ramus, MD 05/30/15 2305

## 2015-05-30 NOTE — Discharge Instructions (Signed)
Take Zofran tablets as needed for nausea or vomiting.  Continue your current antibiotics for your diverticulitis. Return to the emergency department if you are unable to tolerate liquids or light foods by mouth or if you have other urgent concerns.   Diverticulitis Diverticulitis is when small pockets that have formed in your colon (large intestine) become infected or swollen. HOME CARE  Follow your doctor's instructions.  Follow a special diet if told by your doctor.  When you feel better, your doctor may tell you to change your diet. You may be told to eat a lot of fiber. Fruits and vegetables are good sources of fiber. Fiber makes it easier to poop (have bowel movements).  Take supplements or probiotics as told by your doctor.  Only take medicines as told by your doctor.  Keep all follow-up visits with your doctor. GET HELP IF:  Your pain does not get better.  You have a hard time eating food.  You are not pooping like normal. GET HELP RIGHT AWAY IF:  Your pain gets worse.  Your problems do not get better.  Your problems suddenly get worse.  You have a fever.  You keep throwing up (vomiting).  You have bloody or black, tarry poop (stool). MAKE SURE YOU:   Understand these instructions.  Will watch your condition.  Will get help right away if you are not doing well or get worse.   This information is not intended to replace advice given to you by your health care provider. Make sure you discuss any questions you have with your health care provider.   Document Released: 01/28/2008 Document Revised: 08/16/2013 Document Reviewed: 07/06/2013 Elsevier Interactive Patient Education [employer]

## 2015-05-31 ENCOUNTER — Inpatient Hospital Stay: Payer: Self-pay

## 2015-05-31 DIAGNOSIS — R112 Nausea with vomiting, unspecified: Secondary | ICD-10-CM | POA: Diagnosis present

## 2015-05-31 LAB — URINE DRUG SCREEN, QUALITATIVE (ARMC ONLY)
Amphetamines, Ur Screen: NOT DETECTED
Barbiturates, Ur Screen: NOT DETECTED
Benzodiazepine, Ur Scrn: NOT DETECTED
Cannabinoid 50 Ng, Ur ~~LOC~~: POSITIVE — AB
Cocaine Metabolite,Ur ~~LOC~~: POSITIVE — AB
MDMA (Ecstasy)Ur Screen: NOT DETECTED
Methadone Scn, Ur: NOT DETECTED
Opiate, Ur Screen: NOT DETECTED
Phencyclidine (PCP) Ur S: NOT DETECTED
Tricyclic, Ur Screen: NOT DETECTED

## 2015-05-31 LAB — HEMOGLOBIN A1C: Hgb A1c MFr Bld: 6.2 % — ABNORMAL HIGH (ref 4.0–6.0)

## 2015-05-31 MED ORDER — SENNA 8.6 MG PO TABS
1.0000 | ORAL_TABLET | Freq: Every day | ORAL | Status: DC
  Administered 2015-05-31: 8.6 mg via ORAL
  Filled 2015-05-31: qty 1

## 2015-05-31 MED ORDER — CLONIDINE HCL 0.3 MG/24HR TD PTWK
0.3000 mg | MEDICATED_PATCH | TRANSDERMAL | Status: DC
Start: 2015-05-31 — End: 2015-06-01
  Administered 2015-05-31: 0.3 mg via TRANSDERMAL
  Filled 2015-05-31: qty 1

## 2015-05-31 MED ORDER — AMLODIPINE BESYLATE 5 MG PO TABS
5.0000 mg | ORAL_TABLET | Freq: Every day | ORAL | Status: DC
  Administered 2015-05-31: 5 mg via ORAL
  Filled 2015-05-31: qty 1

## 2015-05-31 MED ORDER — LORAZEPAM 2 MG/ML IJ SOLN
1.0000 mg | Freq: Once | INTRAMUSCULAR | Status: AC
  Administered 2015-05-31: 1 mg via INTRAVENOUS

## 2015-05-31 MED ORDER — HYDRALAZINE HCL 20 MG/ML IJ SOLN
INTRAMUSCULAR | Status: AC
  Filled 2015-05-31: qty 1

## 2015-05-31 MED ORDER — DIPHENHYDRAMINE HCL 50 MG/ML IJ SOLN
12.5000 mg | Freq: Once | INTRAMUSCULAR | Status: AC
  Administered 2015-05-31: 12.5 mg via INTRAVENOUS

## 2015-05-31 MED ORDER — HEPARIN SODIUM (PORCINE) 5000 UNIT/ML IJ SOLN
5000.0000 [IU] | Freq: Three times a day (TID) | INTRAMUSCULAR | Status: DC
  Administered 2015-05-31 (×2): 5000 [IU] via SUBCUTANEOUS
  Filled 2015-05-31 (×2): qty 1

## 2015-05-31 MED ORDER — LABETALOL HCL 5 MG/ML IV SOLN: 20.0000 mg | INTRAVENOUS | Status: DC | PRN

## 2015-05-31 MED ORDER — METRONIDAZOLE 500 MG PO TABS
500.0000 mg | ORAL_TABLET | Freq: Two times a day (BID) | ORAL | Status: DC
  Administered 2015-05-31: 500 mg via ORAL
  Filled 2015-05-31: qty 1

## 2015-05-31 MED ORDER — HYDRALAZINE HCL 20 MG/ML IJ SOLN
10.0000 mg | Freq: Once | INTRAMUSCULAR | Status: AC
  Administered 2015-05-31: 10 mg via INTRAVENOUS

## 2015-05-31 MED ORDER — CIPROFLOXACIN HCL 500 MG PO TABS
500.0000 mg | ORAL_TABLET | Freq: Two times a day (BID) | ORAL | Status: DC
  Administered 2015-05-31: 500 mg via ORAL
  Filled 2015-05-31: qty 1

## 2015-05-31 MED ORDER — DOCUSATE SODIUM 100 MG PO CAPS
100.0000 mg | ORAL_CAPSULE | Freq: Two times a day (BID) | ORAL | Status: DC
  Administered 2015-05-31: 100 mg via ORAL
  Filled 2015-05-31: qty 1

## 2015-05-31 MED ORDER — SODIUM CHLORIDE 0.9 % IJ SOLN: 3.0000 mL | Freq: Two times a day (BID) | INTRAMUSCULAR | Status: DC

## 2015-05-31 MED ORDER — HYDRALAZINE HCL 20 MG/ML IJ SOLN: 10.0000 mg | Freq: Four times a day (QID) | INTRAMUSCULAR | Status: DC | PRN

## 2015-05-31 MED ORDER — LORAZEPAM 2 MG/ML IJ SOLN
INTRAMUSCULAR | Status: AC
  Filled 2015-05-31: qty 1

## 2015-05-31 MED ORDER — DIPHENHYDRAMINE HCL 50 MG/ML IJ SOLN
INTRAMUSCULAR | Status: AC
  Filled 2015-05-31: qty 1

## 2015-05-31 MED ORDER — SODIUM CHLORIDE 0.9 % IV SOLN
INTRAVENOUS | Status: DC
  Administered 2015-05-31: 05:00:00 via INTRAVENOUS

## 2015-05-31 MED ORDER — MORPHINE SULFATE (PF) 2 MG/ML IV SOLN: 2.0000 mg | INTRAVENOUS | Status: DC | PRN

## 2015-05-31 MED ORDER — PROMETHAZINE HCL 25 MG/ML IJ SOLN
INTRAMUSCULAR | Status: AC
  Filled 2015-05-31: qty 1

## 2015-05-31 MED ORDER — DOCUSATE SODIUM 100 MG PO CAPS
200.0000 mg | ORAL_CAPSULE | Freq: Two times a day (BID) | ORAL | Status: DC
  Filled 2015-05-31: qty 2

## 2015-05-31 MED ORDER — LORAZEPAM 2 MG/ML IJ SOLN: 2.0000 mg | INTRAMUSCULAR | Status: DC | PRN

## 2015-05-31 MED ORDER — PROMETHAZINE HCL 25 MG/ML IJ SOLN
25.0000 mg | Freq: Once | INTRAMUSCULAR | Status: AC
  Administered 2015-05-31: 25 mg via INTRAVENOUS

## 2015-05-31 MED ORDER — PROMETHAZINE HCL 25 MG PO TABS: 12.5000 mg | ORAL_TABLET | Freq: Four times a day (QID) | ORAL | Status: DC | PRN

## 2015-05-31 NOTE — Care Management Note (Signed)
Case Management Note  Patient Details  Name: Brad [name] MRN: [id number] Date of Birth: 17-Jul-1976  Subjective/Objective:                  Patient is uninsured. I met with his girlfriend Tammy while she had patient's mother on the phone listening and helping answer CM assessment questions. Patient slept through entire conversation and did not contribute to assessment. Tammy said that patient lives with his mom- mom states household income is less than $1300/month. He is independent with mobility/drives. He has been trying to get a job per [employer]. He uses [facility] on [address] for Rx. He has no PCP.  Action/Plan: [facility]. [facility] shared with Tammy. Rx needed today or early tomorrow because [facility] closes at noon on Fridays in order to assist with Rx.RNCM will continue to follow.   Expected Discharge Date:  06/02/15               Expected Discharge Plan:     In-House Referral:     Discharge planning Services  CM Consult, [facility], Medication Assistance  Post Acute Care Choice:    Choice offered to:  Parent (Patient sleeping; Girlfriend at bedside.)  DME Arranged:    DME Agency:     HH Arranged:    [place] Agency:     Status of Service:  In process, will continue to follow  Medicare Important Message Given:    Date Medicare IM Given:    Medicare IM give by:    Date Additional Medicare IM Given:    Additional Medicare Important Message give by:     If discussed at [place] of Stay Meetings, dates discussed:    Additional Comments:  Marshell Garfinkel, RN 05/31/2015, 10:32 AM

## 2015-05-31 NOTE — ED Notes (Signed)
Pt pulled off monitoring equipments, refused monitoring.

## 2015-05-31 NOTE — ED Provider Notes (Signed)
-----------------------------------------   12:05 AM on 05/31/2015 -----------------------------------------  Blood pressure improved. Patient vomited on the floor. Stated patient of laboratory results. Will administer additional dose of IV antiemetic. Discussed with hospitalist for evaluation in the ED for admission for intractable nausea/vomiting.  Irean Hong, MD 05/31/15 0005

## 2015-05-31 NOTE — H&P (Signed)
Brad [name] is an 39 y.o. male.     Chief Complaint: Nausea  HPI: The patient presents the emergency department for the second day in a row complaining of abdominal pain nausea and vomiting. The patient denies seeing any blood or bile in his emesis. He cannot recall how many times he has vomited today. He was diagnosed in the emergency department yesterday with diverticulitis. The patient picked up his pain medication but had not started his antibiotics. Laboratory evaluation in the emergency department today reveals some acute kidney injury likely secondary to dehydration. However, more concerning is the patient's blood pressure which is greater than 200 and his EKG which shows some septal strain. The patient denies chest pain. The aforementioned findings prompted the emergency department staff to call for admission.  Past Medical History  Diagnosis Date  . Diverticulitis     Past Surgical History  Procedure Laterality Date  . Ankle surgery      No family history on file. None Social History:  reports that he has been smoking Cigarettes.  He has been smoking about 0.50 packs per day. He does not have any smokeless tobacco history on file. He reports that he drinks alcohol. His drug history is not on file.  Allergies: No Known Allergies  Prior to Admission medications   Medication Sig Start Date End Date Taking? Authorizing Provider  ciprofloxacin (CIPRO) 500 MG tablet Take 1 tablet (500 mg total) by mouth 2 (two) times daily. 05/29/15 06/08/15 Yes Earleen Newport, MD  metroNIDAZOLE (FLAGYL) 500 MG tablet Take 1 tablet (500 mg total) by mouth 2 (two) times daily. 05/29/15  Yes Earleen Newport, MD  oxyCODONE-acetaminophen (PERCOCET) 5-325 MG tablet Take 2 tablets by mouth every 6 (six) hours as needed for moderate pain or severe pain. 05/29/15  Yes Earleen Newport, MD  ciprofloxacin (CIPRO) 500 MG tablet Take 1 tablet (500 mg total) by mouth 2 (two) times daily. Patient not  taking: Reported on 05/30/2015 05/23/15 06/02/15  Earleen Newport, MD  metroNIDAZOLE (FLAGYL) 500 MG tablet Take 1 tablet (500 mg total) by mouth 2 (two) times daily. Patient not taking: Reported on 05/30/2015 05/23/15   Earleen Newport, MD  ondansetron (ZOFRAN) 4 MG tablet Take 1 tablet (4 mg total) by mouth daily as needed for nausea or vomiting. 05/30/15   Ahmed Prima, MD  oxyCODONE-acetaminophen (PERCOCET) 5-325 MG tablet Take 2 tablets by mouth every 6 (six) hours as needed for moderate pain or severe pain. Patient not taking: Reported on 05/30/2015 05/23/15   Earleen Newport, MD  promethazine (PHENERGAN) 25 MG tablet Take 1 tablet (25 mg total) by mouth every 6 (six) hours as needed for nausea or vomiting. 05/30/15   Ahmed Prima, MD     Results for orders placed or performed during the hospital encounter of 05/30/15 (from the past 48 hour(s))  Comprehensive metabolic panel     Status: Abnormal   Collection Time: 05/30/15 11:09 PM  Result Value Ref Range   Sodium 136 135 - 145 mmol/L   Potassium 3.9 3.5 - 5.1 mmol/L   Chloride 103 101 - 111 mmol/L   CO2 25 22 - 32 mmol/L   Glucose, Bld 117 (H) 65 - 99 mg/dL   BUN 12 6 - 20 mg/dL   Creatinine, Ser 1.42 (H) 0.61 - 1.24 mg/dL   Calcium 8.8 (L) 8.9 - 10.3 mg/dL   Total Protein 8.0 6.5 - 8.1 g/dL   Albumin 4.1 3.5 -  5.0 g/dL   AST 16 15 - 41 U/L   ALT 12 (L) 17 - 63 U/L   Alkaline Phosphatase 66 38 - 126 U/L   Total Bilirubin 1.0 0.3 - 1.2 mg/dL   GFR calc non Af Amer >60 >60 mL/min   GFR calc Af Amer >60 >60 mL/min    Comment: (NOTE) The eGFR has been calculated using the CKD EPI equation. This calculation has not been validated in all clinical situations. eGFR's persistently <60 mL/min signify possible Chronic Kidney Disease.    Anion gap 8 5 - 15  CBC with Differential     Status: Abnormal   Collection Time: 05/30/15 11:09 PM  Result Value Ref Range   WBC 12.5 (H) 3.8 - 10.6 K/uL   RBC 5.94 (H) 4.40 - 5.90  MIL/uL   Hemoglobin 15.6 13.0 - 18.0 g/dL   HCT 48.9 40.0 - 52.0 %   MCV 82.5 80.0 - 100.0 fL   MCH 26.3 26.0 - 34.0 pg   MCHC 31.9 (L) 32.0 - 36.0 g/dL   RDW 14.7 (H) 11.5 - 14.5 %   Platelets 285 150 - 440 K/uL   Neutrophils Relative % 80 %   Neutro Abs 10.0 (H) 1.4 - 6.5 K/uL   Lymphocytes Relative 13 %   Lymphs Abs 1.7 1.0 - 3.6 K/uL   Monocytes Relative 6 %   Monocytes Absolute 0.7 0.2 - 1.0 K/uL   Eosinophils Relative 0 %   Eosinophils Absolute 0.0 0 - 0.7 K/uL   Basophils Relative 1 %   Basophils Absolute 0.1 0 - 0.1 K/uL  Troponin I     Status: None   Collection Time: 05/30/15 11:09 PM  Result Value Ref Range   Troponin I <0.03 <0.031 ng/mL    Comment:        NO INDICATION OF MYOCARDIAL INJURY.    Ct Renal Stone Study  05/29/2015   CLINICAL DATA:  Left lower quadrant abdomen pain for 2 days  EXAM: CT ABDOMEN AND PELVIS WITHOUT CONTRAST  TECHNIQUE: Multidetector CT imaging of the abdomen and pelvis was performed following the standard protocol without IV contrast.  COMPARISON:  Patient's prior CTs are not available for comparison.  FINDINGS: The liver, spleen, pancreas, gallbladder, adrenal glands and kidneys are normal. Minimal atherosclerosis of the aorta is identified without aneurysmal dilatation. There is no abdominal lymphadenopathy.  There is no small bowel obstruction. There is stranding inflammation surrounding the distal descending colon. The appendix is normal.  Fluid-filled bladder is normal. There is no pelvic lymphadenopathy. The lung bases demonstrate atelectasis of right lung base. No acute abnormalities identified in the visualized bones.  IMPRESSION: Descending colon diverticulitis.   Electronically Signed   By: Abelardo Diesel M.D.   On: 05/29/2015 18:31    Review of Systems  Constitutional: Negative for fever and chills.  HENT: Negative for sore throat and tinnitus.   Eyes: Negative for blurred vision and redness.  Respiratory: Negative for cough and  shortness of breath.   Cardiovascular: Negative for chest pain, palpitations, orthopnea and PND.  Gastrointestinal: Positive for nausea and abdominal pain. Negative for vomiting and diarrhea.  Genitourinary: Negative for dysuria, urgency and frequency.  Musculoskeletal: Negative for myalgias and joint pain.  Skin: Negative for rash.       No lesions  Neurological: Negative for speech change, focal weakness and weakness.  Endo/Heme/Allergies: Does not bruise/bleed easily.       No temperature intolerance  Psychiatric/Behavioral: Negative for depression and suicidal  ideas.    Blood pressure 175/92, pulse 99, temperature 98.3 F (36.8 C), temperature source Oral, resp. rate 18, height 5' 5"  (1.651 m), weight 117.935 kg (260 lb), SpO2 97 %. Physical Exam  Constitutional: He appears well-developed and well-nourished. He appears lethargic. No distress.  HENT:  Head: Normocephalic and atraumatic.  Mouth/Throat: Oropharynx is clear and moist.  Eyes: Conjunctivae and EOM are normal. Pupils are equal, round, and reactive to light. No scleral icterus.  Neck: Normal range of motion. Neck supple. No JVD present. No tracheal deviation present. No thyromegaly present.  Cardiovascular: Regular rhythm and normal heart sounds.  Tachycardia present.  Exam reveals no gallop and no friction rub.   No murmur heard. Respiratory: Effort normal and breath sounds normal. No respiratory distress.  GI: Soft. Bowel sounds are normal. He exhibits no distension and no mass. There is tenderness. There is no rebound and no guarding.  Genitourinary:  Deferred  Musculoskeletal: Normal range of motion. He exhibits no edema.  Lymphadenopathy:    He has no cervical adenopathy.  Neurological: He appears lethargic. No cranial nerve deficit.  Patient was oriented to self and place; does not appear to be oriented to time  Skin: Skin is warm and dry. No rash noted. No erythema.  Psychiatric:  Difficult to assess  psychological state as the patient is rather somnolent from anti-emetic medication     Assessment/Plan This is a 39 year old African American male admitted for intractable nausea and vomiting, acute on chronic kidney disease and hypertensive urgency. 1. Intractable nausea and vomiting: Likely secondary to diverticulitis and/or an empty stomach when attempting to take biotics. We will do our best seems symptomatically manage the patient's nausea. I will limit his diet to liquids at this time and advance as tolerated. 2. Acute on chronic kidney disease: Likely secondary to dehydration from the above. Manage blood pressure, avoid nephrotoxic drugs and hydrate with intravenous fluid.  3. Hypertensive urgency: Review the patient's records shows urine drug screen positive for cocaine in the recent past. Upon suggesting this at bedside patient's fianc she admitted that he did use cocaine yesterday following discharge from the emergency department. Reported drug use no doubt contributes to his abdominal pain as well. By the time of our conversation, the patient appeared to be agitated at times with tremors as well as some delirium. I have ordered hydralazine to manage blood pressure is well as Ativan. The patient will remain on telemetry. 4. Sepsis: The patient criteria be a heart rate as well as leukocytosis. He is afebrile and hemodynamically stable albeit with uncontrolled hypertension. These findings are likely secondary to amphetamine use. 5. Diverticulitis: No signs of perforation. Manage pain 6. DVT prophylaxis: Heparin 7. GI prophylaxis: None The patient is full code. Time spent on admission was inpatient care approximately 35 minutes  Harrie Foreman 05/31/2015, 2:45 AM

## 2015-05-31 NOTE — Outcomes Assessment (Signed)
Patient left AMA, MD aware. IV d/c'd

## 2015-05-31 NOTE — Progress Notes (Addendum)
[facility] [place] [facility]   PATIENT NAME: Brad [name]    MR#:  [id number]  DATE OF BIRTH:  Mar 07, 1976  SUBJECTIVE:  CHIEF COMPLAINT:   Chief Complaint  Patient presents with  . Abdominal Pain  . Emesis   the patient is a 39 year old African-American male with asthma, history significant for history of cocaine abuse who presents to the hospital with nausea and vomiting. On arrival to emergency room, he was noted to have markedly elevated blood pressure as high as 212 systolic, he was treated with IV medications and admitted for the for observation and therapy. Since he was having myoclonic jerks, he was initiated on benzodiazepines and now is asleep, not able to review systems. Patient's wife is present during my interview and reports patient having nausea and vomiting as well as abdominal pains over the past few days, also using cocaine. Apparently was recently diagnosed with colitis and was given antibiotic therapy but not able to tolerate because of nausea and vomiting.   Review of Systems  Unable to perform ROS: mental acuity   patient is asleep  VITAL SIGNS: Blood pressure 160/94, pulse 106, temperature 98.4 F (36.9 C), temperature source Oral, resp. rate 19, height  (1.651 m), weight 117.572 kg (259 lb 3.2 oz), SpO2 96 %.  PHYSICAL EXAMINATION:   GENERAL:  39 y.o.-year-old patient lying in the bed with no acute distress. Sleeping and snoring. Obese . Briefly opens his eyes, mumbles by himself and then drifts down to sleep EYES: Pupils equal, round, reactive to light and accommodation. No scleral icterus. Extraocular muscles intact.  HEENT: Head atraumatic, normocephalic. Oropharynx and nasopharynx clear.  NECK:  Supple, no jugular venous distention. No thyroid enlargement, no tenderness.  LUNGS: Normal breath sounds bilaterally, no wheezing, rales,rhonchi or crepitation. No use of accessory muscles of respiration.  CARDIOVASCULAR: S1, S2  normal. No murmurs, rubs, or gallops.  ABDOMEN: Soft, nontender, nondistended. Bowel sounds present. No organomegaly or mass.  EXTREMITIES: No pedal edema, cyanosis, or clubbing.  NEUROLOGIC: Cranial nerves II through XII are intact. Muscle strength 5/5 in all extremities. Sensation intact. Gait not checked.  PSYCHIATRIC: The patient is alert and oriented x 3.  SKIN: No obvious rash, lesion, or ulcer.   ORDERS/RESULTS REVIEWED:   CBC  Recent Labs Lab 05/29/15 1646 05/30/15 2309  WBC 8.2 12.5*  HGB 15.3 15.6  HCT 47.3 48.9  PLT 288 285  MCV 82.1 82.5  MCH 26.7 26.3  MCHC 32.5 31.9*  RDW 14.8* 14.7*  LYMPHSABS  --  1.7  MONOABS  --  0.7  EOSABS  --  0.0  BASOSABS  --  0.1   ------------------------------------------------------------------------------------------------------------------  Chemistries   Recent Labs Lab 05/29/15 1646 05/30/15 2309  NA 140 136  K 3.7 3.9  CL 105 103  CO2 25 25  GLUCOSE 134* 117*  BUN 16 12  CREATININE 1.51* 1.42*  CALCIUM 9.5 8.8*  AST 19 16  ALT 13* 12*  ALKPHOS 61 66  BILITOT 0.7 1.0   ------------------------------------------------------------------------------------------------------------------ estimated creatinine clearance is 82.9 mL/min (by C-G formula based on Cr of 1.42). ------------------------------------------------------------------------------------------------------------------ No results for input(s): TSH, T4TOTAL, T3FREE, THYROIDAB in the last 72 hours.  Invalid input(s): FREET3  Cardiac Enzymes  Recent Labs Lab 05/30/15 2309  TROPONINI <0.03   ------------------------------------------------------------------------------------------------------------------ Invalid input(s): POCBNP ---------------------------------------------------------------------------------------------------------------  RADIOLOGY: Ct Renal Stone Study  05/29/2015   CLINICAL DATA:  Left lower quadrant abdomen pain for 2 days   EXAM: CT ABDOMEN AND  PELVIS WITHOUT CONTRAST  TECHNIQUE: Multidetector CT imaging of the abdomen and pelvis was performed following the standard protocol without IV contrast.  COMPARISON:  Patient's prior CTs are not available for comparison.  FINDINGS: The liver, spleen, pancreas, gallbladder, adrenal glands and kidneys are normal. Minimal atherosclerosis of the aorta is identified without aneurysmal dilatation. There is no abdominal lymphadenopathy.  There is no small bowel obstruction. There is stranding inflammation surrounding the distal descending colon. The appendix is normal.  Fluid-filled bladder is normal. There is no pelvic lymphadenopathy. The lung bases demonstrate atelectasis of right lung base. No acute abnormalities identified in the visualized bones.  IMPRESSION: Descending colon diverticulitis.   Electronically Signed   By: Sherian Rein M.D.   On: 05/29/2015 18:31    EKG:  Orders placed or performed during the hospital encounter of 05/30/15  . EKG 12-Lead  . EKG 12-Lead  . ED EKG  . ED EKG    ASSESSMENT AND PLAN:  Active Problems:   Intractable nausea and vomiting 1. Intractable nausea and vomiting as well as abdominal pain, questionable opiate withdrawal, no CT scan of abdomen and pelvis was done recently, physical exam. The patient is asleep, does not elicit any pain on palpation, discontinue antibiotics, continue supportive therapy only, discontinue morphine IV. Get CT scan of abdomen and pelvis without contrast or three-way abdominal x-ray if abdominal pain recurs 2. Diffuse abdominal pain. Per report, none during my palpation, get CT scan of abdomen and pelvis without contrast. If pain recurs, discontinue pain medications 3. Malignant essential hypertension. Continue current medications. Add Norvasc, advance Norvasc as needed 4. Renal insufficiency, likely due to long-standing hypertension, follow with therapy, get renal ultrasound 5. Leukocytosis of unclear etiology,  follow tomorrow morning. Discontinue antibiotic therapy for now 6. Constipation. Per report, initiate patient on Colace and senna. Could be the real reason of abdominal pain 7. Suspected polysubstance abuse, cocaine as well as questionable opiates causing constipation, patient may benefit from substance abuse program, if interested   Management plans discussed with the patient, family and they are in agreement.   DRUG ALLERGIES: No Known Allergies  CODE STATUS:     Code Status Orders        Start     Ordered   05/31/15 0401  Full code   Continuous     05/31/15 0400      TOTAL TIME TAKING CARE OF THIS PATIENT: 45 minutes.  Prolonged discussion this patient's family about his condition evaluation and treatment plan, discharge planning was also discussed and spent approximately 10-15 minutes on coordination of care  Aliyanah Rozas M.D on 05/31/2015 at 3:36 PM  Between 7am to 6pm - Pager - [phone]  After 6pm go to [email or web address] - password [email or web address]  [facility]  Office  [phone]  CC: Primary care physician; No primary care provider on file.

## 2015-05-31 NOTE — Progress Notes (Signed)
Pt. Admitted to unit with dx of vomiting. VSS. No c/o pain. IVF infusing in #20 in L AC at 150cc/hr. Running SR per telemetry monitor. Admission completed and pt. Resting quietly at this time. Will continue to monitor.

## 2015-06-01 NOTE — Discharge Summary (Signed)
[facility] - [place] at [facility]   PATIENT NAME: Brad [name]    MR#:  [id number]  DATE OF BIRTH:  08/09/1976  DATE OF ADMISSION:  05/30/2015 ADMITTING PHYSICIAN: Arnaldo Natal, MD  DATE OF DISCHARGE: 05/31/2015  9:33 PM  PRIMARY CARE PHYSICIAN: No primary care provider on file.     ADMISSION DIAGNOSIS:  Diverticulitis of large intestine without perforation or abscess without bleeding [K57.32] Secondary hypertension, unspecified [I15.9] Non-intractable vomiting with nausea, vomiting of unspecified type [R11.2]  DISCHARGE DIAGNOSIS:  Active Problems:   Intractable nausea and vomiting   SECONDARY DIAGNOSIS:   Past Medical History  Diagnosis Date  . Diverticulitis     .pro HOSPITAL COURSE:   The patient is a 39 year old African-American male with asthma, history significant for history of cocaine abuse who presented to the hospital with nausea and vomiting. On arrival to emergency room, he was noted to have markedly elevated blood pressure as high as 212 systolic, he was treated with IV medications and admitted for the for observation and therapy. Since he was having myoclonic jerks, he was initiated on benzodiazepines as needed. Patient's wife was present during my interview and reported patient having nausea and vomiting as well as abdominal pains , diffusely for a few days, also using cocaine. Apparently was recently diagnosed with colitis and was given antibiotic therapy but was not able to tolerate because of nausea and vomiting.The patient is not found to have significant abnormalities on abdominal exam, so his pain medications were stopped. Upon learning that he decided to leave AGAINST MEDICAL ADVICE Discussion by problem 1. Intractable nausea and vomiting as well as abdominal pain, per history, questionable opiate withdrawal, no CT scan of abdomen and pelvis was done recently, however, on physical exam there was no pain on palpation, patient  decided to leave against medical advise, he should resume therapy as previously ordered. No changes were otherwise made 2. Diffuse abdominal pain. Per report, none during my evaluation, abnormal x-ray did not show constipation but some swelling in the descending colon , possible  Diverticulitis, no changes in medical therapy 3. Malignant essential hypertension. Patient had Norvasc added to however did not wait for prescription to be given to him and left against medical advise. It is recommended to follow patient's blood pressure readings closely and initiate additional medications if needed 4. Renal insufficiency, likely due to long-standing hypertension, follow with therapy, renal ultrasound showed no hydronephrosis 5. Leukocytosis of unclear etiology, recommend to follow as outpatient 6. Constipation. Per report, patient may benefit from medications to help with constipation , although abdominal x-rays did not show significant fecal burden  7. Suspected polysubstance abuse, cocaine as well as questionable opiates causing constipation, patient may benefit from substance abuse program, if interested as outpatient DISCHARGE CONDITIONS:   Stable  CONSULTS OBTAINED:     DRUG ALLERGIES:  No Known Allergies  DISCHARGE MEDICATIONS:   Discharge Medication List as of 05/30/2015  7:29 PM    CONTINUE these medications which have NOT CHANGED   Details  !! ciprofloxacin (CIPRO) 500 MG tablet Take 1 tablet (500 mg total) by mouth 2 (two) times daily., Starting 05/29/2015, Until Fri 06/08/15, Print    !! metroNIDAZOLE (FLAGYL) 500 MG tablet Take 1 tablet (500 mg total) by mouth 2 (two) times daily., Starting 05/29/2015, Until Discontinued, Print    !! oxyCODONE-acetaminophen (PERCOCET) 5-325 MG tablet Take 2 tablets by mouth every 6 (six) hours as needed for moderate pain or severe pain., Starting 05/29/2015,  Until Discontinued, Print    !! ciprofloxacin (CIPRO) 500 MG tablet Take 1 tablet (500 mg total)  by mouth 2 (two) times daily., Starting 05/23/2015, Until Sat 06/02/15, Print    !! metroNIDAZOLE (FLAGYL) 500 MG tablet Take 1 tablet (500 mg total) by mouth 2 (two) times daily., Starting 05/23/2015, Until Discontinued, Print    !! oxyCODONE-acetaminophen (PERCOCET) 5-325 MG tablet Take 2 tablets by mouth every 6 (six) hours as needed for moderate pain or severe pain., Starting 05/23/2015, Until Discontinued, Print     !! - Potential duplicate medications found. Please discuss with provider.       DISCHARGE INSTRUCTIONS:    Patient is to follow-up with primary care physician  If you experience worsening of your admission symptoms, develop shortness of breath, life threatening emergency, suicidal or homicidal thoughts you must seek medical attention immediately by calling 911 or calling your MD immediately  if symptoms less severe.  You Must read complete instructions/literature along with all the possible adverse reactions/side effects for all the Medicines you take and that have been prescribed to you. Take any new Medicines after you have completely understood and accept all the possible adverse reactions/side effects.   Please note  You were cared for by a hospitalist during your hospital stay. If you have any questions about your discharge medications or the care you received while you were in the hospital after you are discharged, you can call the unit and asked to speak with the hospitalist on call if the hospitalist that took care of you is not available. Once you are discharged, your primary care physician will handle any further medical issues. Please note that NO REFILLS for any discharge medications will be authorized once you are discharged, as it is imperative that you return to your primary care physician (or establish a relationship with a primary care physician if you do not have one) for your aftercare needs so that they can reassess your need for medications and monitor your lab  values.    Today   CHIEF COMPLAINT:   Chief Complaint  Patient presents with  . Abdominal Pain  . Emesis    HISTORY OF PRESENT ILLNESS:  Brad [name]  is a 39 y.o. male with a known history of asthma, history significant for history of cocaine abuse who presented to the hospital with nausea and vomiting. On arrival to emergency room, he was noted to have markedly elevated blood pressure as high as 212 systolic, he was treated with IV medications and admitted for the for observation and therapy. Since he was having myoclonic jerks, he was initiated on benzodiazepines as needed. Patient's wife was present during my interview and reported patient having nausea and vomiting as well as abdominal pains , diffusely for a few days, also using cocaine. Apparently was recently diagnosed with colitis and was given antibiotic therapy but was not able to tolerate because of nausea and vomiting.The patient is not found to have significant abnormalities on abdominal exam, so his pain medications were stopped. Upon learning that he decided to leave AGAINST MEDICAL ADVICE Discussion by problem 1. Intractable nausea and vomiting as well as abdominal pain, per history, questionable opiate withdrawal, no CT scan of abdomen and pelvis was done recently, however, on physical exam there was no pain on palpation, patient decided to leave against medical advise, he should resume therapy as previously ordered. No changes were otherwise made 2. Diffuse abdominal pain. Per report, none during my evaluation, abnormal x-ray did not  show constipation but some swelling in the descending colon , possible  Diverticulitis, no changes in medical therapy 3. Malignant essential hypertension. Patient had Norvasc added to however did not wait for prescription to be given to him and left against medical advise. It is recommended to follow patient's blood pressure readings closely and initiate additional medications if needed 4. Renal  insufficiency, likely due to long-standing hypertension, follow with therapy, renal ultrasound showed no hydronephrosis 5. Leukocytosis of unclear etiology, recommend to follow as outpatient 6. Constipation. Per report, patient may benefit from medications to help with constipation , although abdominal x-rays did not show significant fecal burden  7. Suspected polysubstance abuse, cocaine as well as questionable opiates causing constipation, patient may benefit from substance abuse program, if interested as outpatient    VITAL SIGNS:  Blood pressure 149/75, pulse 90, temperature 98.5 F (36.9 C), temperature source Oral, resp. rate 18, height 5\' 5"  (1.651 m), weight 117.572 kg (259 lb 3.2 oz), SpO2 98 %.  I/O:  No intake or output data in the 24 hours ending 06/01/15 1741  PHYSICAL EXAMINATION:  GENERAL:  39 y.o.-year-old patient lying in the bed with no acute distress.  EYES: Pupils equal, round, reactive to light and accommodation. No scleral icterus. Extraocular muscles intact.  HEENT: Head atraumatic, normocephalic. Oropharynx and nasopharynx clear.  NECK:  Supple, no jugular venous distention. No thyroid enlargement, no tenderness.  LUNGS: Normal breath sounds bilaterally, no wheezing, rales,rhonchi or crepitation. No use of accessory muscles of respiration.  CARDIOVASCULAR: S1, S2 normal. No murmurs, rubs, or gallops.  ABDOMEN: Soft, non-tender, non-distended. Bowel sounds present. No organomegaly or mass.  EXTREMITIES: No pedal edema, cyanosis, or clubbing.  NEUROLOGIC: Cranial nerves II through XII are intact. Muscle strength 5/5 in all extremities. Sensation intact. Gait not checked.  PSYCHIATRIC: The patient is alert and oriented x 3.  SKIN: No obvious rash, lesion, or ulcer.   DATA REVIEW:   CBC  Recent Labs Lab 05/30/15 2309  WBC 12.5*  HGB 15.6  HCT 48.9  PLT 285    Chemistries   Recent Labs Lab 05/30/15 2309  NA 136  K 3.9  CL 103  CO2 25  GLUCOSE 117*   BUN 12  CREATININE 1.42*  CALCIUM 8.8*  AST 16  ALT 12*  ALKPHOS 66  BILITOT 1.0    Cardiac Enzymes  Recent Labs Lab 05/30/15 2309  TROPONINI <0.03    Microbiology Results  No results found for this or any previous visit.  RADIOLOGY:  US Renal  05/31/2015   CLINICAL DATA:  Patient with acute renal failure.  EXAM: RENAL / URINARY TRACT ULTRASOUND COMPLETE  COMPARISON:  CT abdomen pelvis 05/29/2015  FINDINGS: Right Kidney:  Length: 10.2 cm. Echogenicity within normal limits. No mass or hydronephrosis visualized.  Left Kidney:  Length: 12.0 cm. Echogenicity within normal limits. No mass or hydronephrosis visualized.  Bladder:  Appears normal for degree of bladder distention.  IMPRESSION: No hydronephrosis.   Electronically Signed   By: Annia Belt M.D.   On: 05/31/2015 17:31   Dg Abd 2 Views  05/31/2015   CLINICAL DATA:  Abdominal pain with nausea and vomiting  EXAM: ABDOMEN - 2 VIEW  COMPARISON:  05/29/2015  FINDINGS: Scattered large and small bowel gas is noted. Fecal material is noted within the colon. No free air is seen. No obstructive changes are noted. Some edema is noted within the descending colon consistent with the patient's given clinical history of diverticulitis. No bony abnormality is  noted.  IMPRESSION: Changes consistent with the known history of diverticulitis. No other focal abnormality is seen.   Electronically Signed   By: Alcide Clever M.D.   On: 05/31/2015 16:51    EKG:   Orders placed or performed during the hospital encounter of 05/30/15  . EKG 12-Lead  . EKG 12-Lead  . ED EKG  . ED EKG  . EKG      Management plans discussed with the patient, family and they are in agreement.  CODE STATUS:   TOTAL TIME TAKING CARE OF THIS PATIENT: 40 minutes.    Katharina Caper M.D on 06/01/2015 at 5:41 PM  Between 7am to 6pm - Pager - [phone]  After 6pm go to [email or web address] - password [email or web address] [facility]  [place] [facility]  Office   [phone]  CC: Primary care physician; No primary care provider on file.

## 2015-08-11 ENCOUNTER — Emergency Department: Payer: Self-pay

## 2015-08-11 ENCOUNTER — Encounter: Payer: Self-pay | Admitting: Emergency Medicine

## 2015-08-11 ENCOUNTER — Emergency Department
Admission: EM | Admit: 2015-08-11 | Discharge: 2015-08-12 | Disposition: A | Discharge status: Home / Self Care | Payer: Self-pay | Attending: Emergency Medicine | Admitting: Emergency Medicine

## 2015-08-11 DIAGNOSIS — F17210 Nicotine dependence, cigarettes, uncomplicated: Secondary | ICD-10-CM | POA: Insufficient documentation

## 2015-08-11 DIAGNOSIS — R109 Unspecified abdominal pain: Secondary | ICD-10-CM | POA: Insufficient documentation

## 2015-08-11 DIAGNOSIS — I10 Essential (primary) hypertension: Secondary | ICD-10-CM | POA: Insufficient documentation

## 2015-08-11 DIAGNOSIS — Z792 Long term (current) use of antibiotics: Secondary | ICD-10-CM | POA: Insufficient documentation

## 2015-08-11 DIAGNOSIS — R0682 Tachypnea, not elsewhere classified: Secondary | ICD-10-CM | POA: Insufficient documentation

## 2015-08-11 HISTORY — DX: Unspecified asthma, uncomplicated: J45.909

## 2015-08-11 HISTORY — DX: Essential (primary) hypertension: I10

## 2015-08-11 LAB — CBC
HEMATOCRIT: 47.1 % (ref 40.0–52.0)
HEMOGLOBIN: 15.3 g/dL (ref 13.0–18.0)
MCH: 26.4 pg (ref 26.0–34.0)
MCHC: 32.5 g/dL (ref 32.0–36.0)
MCV: 81.2 fL (ref 80.0–100.0)
Platelets: 288 10*3/uL (ref 150–440)
RBC: 5.8 MIL/uL (ref 4.40–5.90)
RDW: 15.3 % — AB (ref 11.5–14.5)
WBC: 10.5 10*3/uL (ref 3.8–10.6)

## 2015-08-11 LAB — COMPREHENSIVE METABOLIC PANEL
ALK PHOS: 62 U/L (ref 38–126)
ALT: 17 U/L (ref 17–63)
AST: 16 U/L (ref 15–41)
Albumin: 4.5 g/dL (ref 3.5–5.0)
Anion gap: 11 (ref 5–15)
BILIRUBIN TOTAL: 0.8 mg/dL (ref 0.3–1.2)
BUN: 11 mg/dL (ref 6–20)
CHLORIDE: 102 mmol/L (ref 101–111)
CO2: 27 mmol/L (ref 22–32)
Calcium: 9.7 mg/dL (ref 8.9–10.3)
Creatinine, Ser: 1.46 mg/dL — ABNORMAL HIGH (ref 0.61–1.24)
GFR calc Af Amer: >60 mL/min (ref >60)
GFR, EST NON AFRICAN AMERICAN: 59 mL/min — AB (ref >60)
GLUCOSE: 124 mg/dL — AB (ref 65–99)
POTASSIUM: 3.9 mmol/L (ref 3.5–5.1)
Sodium: 140 mmol/L (ref 135–145)
TOTAL PROTEIN: 8.2 g/dL — AB (ref 6.5–8.1)

## 2015-08-11 LAB — TROPONIN I: Troponin I: <0.03 ng/mL (ref <0.031)

## 2015-08-11 LAB — URINALYSIS COMPLETE WITH MICROSCOPIC (ARMC ONLY)
BACTERIA UA: NONE SEEN
Bilirubin Urine: NEGATIVE
Glucose, UA: NEGATIVE mg/dL
Hgb urine dipstick: NEGATIVE
KETONES UR: NEGATIVE mg/dL
LEUKOCYTES UA: NEGATIVE
NITRITE: NEGATIVE
PH: 6 (ref 5.0–8.0)
PROTEIN: 100 mg/dL — AB
RBC / HPF: NONE SEEN RBC/hpf (ref 0–5)
SPECIFIC GRAVITY, URINE: 1.025 (ref 1.005–1.030)

## 2015-08-11 LAB — LIPASE, BLOOD: Lipase: 20 U/L (ref 11–51)

## 2015-08-11 MED ORDER — IOHEXOL 300 MG/ML  SOLN
125.0000 mL | Freq: Once | INTRAMUSCULAR | Status: AC | PRN
Start: 2015-08-11 — End: 2015-08-11
  Administered 2015-08-11: 125 mL via INTRAVENOUS

## 2015-08-11 MED ORDER — ONDANSETRON HCL 4 MG/2ML IJ SOLN
4.0000 mg | Freq: Once | INTRAMUSCULAR | Status: AC
  Administered 2015-08-11: 4 mg via INTRAVENOUS

## 2015-08-11 MED ORDER — ONDANSETRON HCL 4 MG/2ML IJ SOLN
INTRAMUSCULAR | Status: AC
  Administered 2015-08-11: 4 mg via INTRAVENOUS
  Filled 2015-08-11: qty 2

## 2015-08-11 MED ORDER — IOHEXOL 240 MG/ML SOLN
25.0000 mL | Freq: Once | INTRAMUSCULAR | Status: AC | PRN
  Administered 2015-08-11: 25 mL via ORAL

## 2015-08-11 MED ORDER — ENALAPRILAT 1.25 MG/ML IV SOLN
1.2500 mg | Freq: Once | INTRAVENOUS | Status: AC
  Administered 2015-08-11: 1.25 mg via INTRAVENOUS
  Filled 2015-08-11: qty 2

## 2015-08-11 MED ORDER — MORPHINE SULFATE (PF) 4 MG/ML IV SOLN
4.0000 mg | Freq: Once | INTRAVENOUS | Status: AC
  Administered 2015-08-11: 4 mg via INTRAVENOUS

## 2015-08-11 MED ORDER — MORPHINE SULFATE (PF) 4 MG/ML IV SOLN
4.0000 mg | Freq: Once | INTRAVENOUS | Status: AC
  Administered 2015-08-11: 4 mg via INTRAVENOUS
  Filled 2015-08-11: qty 1

## 2015-08-11 MED ORDER — ONDANSETRON HCL 4 MG/2ML IJ SOLN
4.0000 mg | Freq: Once | INTRAMUSCULAR | Status: AC
  Administered 2015-08-11: 4 mg via INTRAVENOUS
  Filled 2015-08-11: qty 2

## 2015-08-11 MED ORDER — SODIUM CHLORIDE 0.9 % IV BOLUS (SEPSIS)
1000.0000 mL | Freq: Once | INTRAVENOUS | Status: AC
  Administered 2015-08-11: 1000 mL via INTRAVENOUS

## 2015-08-11 MED ORDER — MORPHINE SULFATE (PF) 4 MG/ML IV SOLN
INTRAVENOUS | Status: AC
  Administered 2015-08-11: 4 mg via INTRAVENOUS
  Filled 2015-08-11: qty 1

## 2015-08-11 NOTE — Discharge Instructions (Signed)
Abdominal Pain, Adult Many things can cause abdominal pain. Usually, abdominal pain is not caused by a disease and will improve without treatment. It can often be observed and treated at home. Your health care provider will do a physical exam and possibly order blood tests and X-rays to help determine the seriousness of your pain. However, in many cases, more time must pass before a clear cause of the pain can be found. Before that point, your health care provider may not know if you need more testing or further treatment. HOME CARE INSTRUCTIONS Monitor your abdominal pain for any changes. The following actions may help to alleviate any discomfort you are experiencing:  Only take over-the-counter or prescription medicines as directed by your health care provider.  Do not take laxatives unless directed to do so by your health care provider.  Try a clear liquid diet (broth, tea, or water) as directed by your health care provider. Slowly move to a bland diet as tolerated. SEEK MEDICAL CARE IF:  You have unexplained abdominal pain.  You have abdominal pain associated with nausea or diarrhea.  You have pain when you urinate or have a bowel movement.  You experience abdominal pain that wakes you in the night.  You have abdominal pain that is worsened or improved by eating food.  You have abdominal pain that is worsened with eating fatty foods.  You have a fever. SEEK IMMEDIATE MEDICAL CARE IF:  Your pain does not go away within 2 hours.  You keep throwing up (vomiting).  Your pain is felt only in portions of the abdomen, such as the right side or the left lower portion of the abdomen.  You pass bloody or black tarry stools. MAKE SURE YOU:  Understand these instructions.  Will watch your condition.  Will get help right away if you are not doing well or get worse.   This information is not intended to replace advice given to you by your health care provider. Make sure you discuss  any questions you have with your health care provider.   Document Released: 05/21/2005 Document Revised: 05/02/2015 Document Reviewed: 04/20/2013 Elsevier Interactive Patient Education 2016 [employer]  Please use the Percocet as needed for the pain. Please take the Cipro twice a day and Flagyl 3 times a day to treat the diverticulitis. Please follow-up with Dr. Mechele CollinElliott gastroenterology and your doctor. Call Dr. Mechele CollinElliott on Monday morning. Tone urine emergency room with diverticulitis in the same place again and that endoscopy was recommended.. Please return for any worsening of the pain for fever or if you begin to vomit and can't keep anything down.

## 2015-08-11 NOTE — ED Provider Notes (Addendum)
[facility] Emergency Department Provider Note  ____________________________________________  Time seen: Approximately 5:23 PM  I have reviewed the triage vital signs and the nursing notes.   HISTORY  Chief Complaint Abdominal Pain   HPI Brad [name] is a 39 y.o. male patient reports left lower quadrant pain just like his prior episodes of diverticulitis coming and going for the last couple days. He's also had 2 days of nausea and vomiting. He is not running a fever is not having any shortness of breath or chest discomfort. He is however breathing fast when he is laying there in the bed. His blood pressure is extremely high to 19 systolic patient reports she was on a pressure medicines years ago but stopped taking them when he ran out he has never gotten them refilled his blood pressures been this high in the past.  Past Medical History  Diagnosis Date  . Diverticulitis   . Hypertension   . Asthma     Patient Active Problem List   Diagnosis Date Noted  . Intractable nausea and vomiting 05/31/2015    Past Surgical History  Procedure Laterality Date  . Ankle surgery      Current Outpatient Rx  Name  Route  Sig  Dispense  Refill  . metroNIDAZOLE (FLAGYL) 500 MG tablet   Oral   Take 1 tablet (500 mg total) by mouth 2 (two) times daily. Patient not taking: Reported on 05/30/2015   20 tablet   0   . metroNIDAZOLE (FLAGYL) 500 MG tablet   Oral   Take 1 tablet (500 mg total) by mouth 2 (two) times daily.   20 tablet   0   . ondansetron (ZOFRAN) 4 MG tablet   Oral   Take 1 tablet (4 mg total) by mouth daily as needed for nausea or vomiting.   10 tablet   0   . oxyCODONE-acetaminophen (PERCOCET) 5-325 MG tablet   Oral   Take 2 tablets by mouth every 6 (six) hours as needed for moderate pain or severe pain. Patient not taking: Reported on 05/30/2015   20 tablet   0   . oxyCODONE-acetaminophen (PERCOCET) 5-325 MG tablet   Oral   Take 2  tablets by mouth every 6 (six) hours as needed for moderate pain or severe pain.   20 tablet   0   . promethazine (PHENERGAN) 25 MG tablet   Oral   Take 1 tablet (25 mg total) by mouth every 6 (six) hours as needed for nausea or vomiting.   8 tablet   0     Allergies Review of patient's allergies indicates no known allergies.  History reviewed. No pertinent family history.  Social History Social History  Substance Use Topics  . Smoking status: Current Every Day Smoker -- 0.50 packs/day    Types: Cigarettes  . Smokeless tobacco: None  . Alcohol Use: Yes     Comment: occas    Review of Systems Constitutional: No fever/chills Eyes: No visual changes. ENT: No sore throat. Cardiovascular: Denies chest pain. Respiratory: Denies shortness of breath. Gastrointestinal see history of present illness Genitourinary: Negative for dysuria. Musculoskeletal: Negative for back pain. Skin: Negative for rash. Neurological: Negative for headaches, focal weakness or numbness.  10-point ROS otherwise negative.  ____________________________________________   PHYSICAL EXAM:  VITAL SIGNS: ED Triage Vitals  Enc Vitals Group     BP 08/11/15 1415 225/113 mmHg     Pulse Rate 08/11/15 1415 79     Resp 08/11/15  1415 20     Temp 08/11/15 1415 98.2 F (36.8 C)     Temp Source 08/11/15 1415 Oral     SpO2 08/11/15 1415 100 %     Weight 08/11/15 1415 260 lb (117.935 kg)     Height 08/11/15 1415  (1.676 m)     Head Cir --      Peak Flow --      Pain Score 08/11/15 1416 10     Pain Loc --      Pain Edu? --      Excl. in GC? --     Constitutional: Alert and oriented. Well appearing and in no acute distress. But breathing fast Eyes: Conjunctivae are normal. PERRL. EOMI. Head: Atraumatic. Nose: No congestion/rhinnorhea. Mouth/Throat: Mucous membranes are moist.  Oropharynx non-erythematous. Neck: No stridor.  {* Cardiovascular: Normal rate, regular rhythm. Grossly normal heart  sounds.  Good peripheral circulation. Respiratory: Normal respiratory effort.  No retractions. Lungs CTAB. Gastrointestinal: Soft and nontender at the present time. Patient reports he had left lower quadrant pain intermittently for the last few days.. No distention. No abdominal bruits. No CVA tenderness. Musculoskeletal: No lower extremity tenderness nor edema.  No joint effusions. Neurologic:  Normal speech and language. No gross focal neurologic deficits are appreciated. No gait instability. Skin:  Skin is warm, dry and intact. No rash noted. Psychiatric: Mood and affect are normal. Speech and behavior are normal.  ____________________________________________   LABS (all labs ordered are listed, but only abnormal results are displayed)  Labs Reviewed  COMPREHENSIVE METABOLIC PANEL - Abnormal; Notable for the following:    Glucose, Bld 124 (*)    Creatinine, Ser 1.46 (*)    Total Protein 8.2 (*)    GFR calc non Af Amer 59 (*)    All other components within normal limits  CBC - Abnormal; Notable for the following:    RDW 15.3 (*)    All other components within normal limits  URINALYSIS COMPLETEWITH MICROSCOPIC ([facility] ONLY) - Abnormal; Notable for the following:    Color, Urine YELLOW (*)    APPearance HAZY (*)    Protein, ur 100 (*)    Squamous Epithelial / LPF 0-5 (*)    All other components within normal limits  LIPASE, BLOOD  TROPONIN I   ____________________________________________  EKG  EKG normal sinus rhythm at 63 right axis he has flipped T's with some ST segment depression inferiorly and in the lateral chest leads. Old EKG in October of this year showed flipped T's in the lateral limb leads and the lateral chest leads. Both EKGs looks similar. ____________________________________  RADIOLOGY  Chest x-ray is no acute disease CT of the abdomen shows diverticulitis at the site of previous diverticulitis left  colon. ____________________________________________   PROCEDURES  Patient's blood pressure goes down when his pain is under control. I will have him follow-up with his regular doctor which I told him twice to check on the blood pressure.  ____________________________________________   INITIAL IMPRESSION / ASSESSMENT AND PLAN / ED COURSE  Pertinent labs & imaging results that were available during my care of the patient were reviewed by me and considered in my medical decision making (see chart for details).   ____________________________________________   FINAL CLINICAL IMPRESSION(S) / ED DIAGNOSES  Final diagnoses:  Abdominal pain, unspecified abdominal location      Arnaldo Natal, MD 08/11/15 235  This chart is complete  Arnaldo Natal, MD 08/28/15 1518  The EKG and the  left that was read and interpreted by me  Arnaldo Natal, MD 08/28/15 1520

## 2015-08-11 NOTE — ED Notes (Signed)
Patient transported to CT 

## 2015-08-11 NOTE — ED Notes (Signed)
Pt to ed with c/o abd pain on left side.  Pt states hx of diverticulitis and this feels the same.  Pt reports normal bm this am.  Pt also reports nausea and vomiting x 2 days.

## 2015-08-12 NOTE — ED Notes (Signed)
Pt discharged home with family driving.  Discharge instructions given to pt.  Voiced understanding.  Teach back verified.  No questions or concerns at this time.  Pt in NAD.  No items left in ED.

## 2016-01-29 ENCOUNTER — Emergency Department: Payer: Self-pay

## 2016-01-29 ENCOUNTER — Encounter: Payer: Self-pay | Admitting: Emergency Medicine

## 2016-01-29 ENCOUNTER — Emergency Department
Admission: EM | Admit: 2016-01-29 | Discharge: 2016-01-29 | Disposition: A | Discharge status: Home / Self Care | Payer: Self-pay | Attending: Emergency Medicine | Admitting: Emergency Medicine

## 2016-01-29 DIAGNOSIS — J45909 Unspecified asthma, uncomplicated: Secondary | ICD-10-CM | POA: Insufficient documentation

## 2016-01-29 DIAGNOSIS — F17210 Nicotine dependence, cigarettes, uncomplicated: Secondary | ICD-10-CM | POA: Insufficient documentation

## 2016-01-29 DIAGNOSIS — I10 Essential (primary) hypertension: Secondary | ICD-10-CM | POA: Insufficient documentation

## 2016-01-29 DIAGNOSIS — R1011 Right upper quadrant pain: Secondary | ICD-10-CM | POA: Insufficient documentation

## 2016-01-29 DIAGNOSIS — R809 Proteinuria, unspecified: Secondary | ICD-10-CM | POA: Insufficient documentation

## 2016-01-29 LAB — COMPREHENSIVE METABOLIC PANEL
ALK PHOS: 65 U/L (ref 38–126)
ALT: 18 U/L (ref 17–63)
ANION GAP: 10 (ref 5–15)
AST: 24 U/L (ref 15–41)
Albumin: 4.1 g/dL (ref 3.5–5.0)
BILIRUBIN TOTAL: 0.5 mg/dL (ref 0.3–1.2)
BUN: 17 mg/dL (ref 6–20)
CALCIUM: 9.2 mg/dL (ref 8.9–10.3)
CO2: 23 mmol/L (ref 22–32)
Chloride: 107 mmol/L (ref 101–111)
Creatinine, Ser: 1.47 mg/dL — ABNORMAL HIGH (ref 0.61–1.24)
GFR calc Af Amer: >60 mL/min (ref >60)
GFR, EST NON AFRICAN AMERICAN: 58 mL/min — AB (ref >60)
GLUCOSE: 165 mg/dL — AB (ref 65–99)
POTASSIUM: 3.5 mmol/L (ref 3.5–5.1)
Sodium: 140 mmol/L (ref 135–145)
TOTAL PROTEIN: 7.5 g/dL (ref 6.5–8.1)

## 2016-01-29 LAB — CBC
HEMATOCRIT: 44.4 % (ref 40.0–52.0)
HEMOGLOBIN: 14.6 g/dL (ref 13.0–18.0)
MCH: 26.7 pg (ref 26.0–34.0)
MCHC: 32.9 g/dL (ref 32.0–36.0)
MCV: 81 fL (ref 80.0–100.0)
Platelets: 216 10*3/uL (ref 150–440)
RBC: 5.48 MIL/uL (ref 4.40–5.90)
RDW: 15.4 % — AB (ref 11.5–14.5)
WBC: 9.9 10*3/uL (ref 3.8–10.6)

## 2016-01-29 LAB — URINALYSIS COMPLETE WITH MICROSCOPIC (ARMC ONLY)
BACTERIA UA: NONE SEEN
Bilirubin Urine: NEGATIVE
GLUCOSE, UA: 50 mg/dL — AB
HGB URINE DIPSTICK: NEGATIVE
LEUKOCYTES UA: NEGATIVE
NITRITE: NEGATIVE
Protein, ur: 100 mg/dL — AB
RBC / HPF: NONE SEEN RBC/hpf (ref 0–5)
SPECIFIC GRAVITY, URINE: 1.03 (ref 1.005–1.030)
pH: 5 (ref 5.0–8.0)

## 2016-01-29 LAB — LIPASE, BLOOD: Lipase: 25 U/L (ref 11–51)

## 2016-01-29 LAB — TROPONIN I: Troponin I: <0.03 ng/mL (ref <0.031)

## 2016-01-29 MED ORDER — METOPROLOL TARTRATE 25 MG PO TABS
ORAL_TABLET | ORAL | Status: AC
  Administered 2016-01-29: 25 mg via ORAL
  Filled 2016-01-29: qty 1

## 2016-01-29 MED ORDER — METOPROLOL TARTRATE 25 MG PO TABS: 25.0000 mg | ORAL_TABLET | Freq: Two times a day (BID) | ORAL | Status: DC

## 2016-01-29 MED ORDER — ACETAMINOPHEN 325 MG PO TABS
ORAL_TABLET | ORAL | Status: AC
  Administered 2016-01-29: 650 mg via ORAL
  Filled 2016-01-29: qty 2

## 2016-01-29 MED ORDER — METOPROLOL TARTRATE 25 MG PO TABS
25.0000 mg | ORAL_TABLET | Freq: Once | ORAL | Status: AC
  Administered 2016-01-29: 25 mg via ORAL
  Filled 2016-01-29: qty 1

## 2016-01-29 MED ORDER — ACETAMINOPHEN 325 MG PO TABS
650.0000 mg | ORAL_TABLET | Freq: Once | ORAL | Status: AC
  Administered 2016-01-29: 650 mg via ORAL

## 2016-01-29 NOTE — ED Notes (Signed)
Pt denies having fevers or changes in bowel movements or urination. Pt denies trauma to right side. Pt able to ambulate independently but reports pain with movement.

## 2016-01-29 NOTE — Discharge Instructions (Signed)
Today have an elevated blood pressure and protein in your urine and an abnormal kidney function test, which shows an early sign of kidney dysfunction due to your blood pressure being elevated. It is VERY important for you to make an appointment with a primary care physician to follow-up your high blood pressure.  For your abdominal pain, you may take Tylenol. Do not take NSAID such as Aleve, ibuprofen, Motrin or Advil, as these can worsen your kidney dysfunction.  Return to the emergency department if you develop severe pain, inability to keep down fluids, fever, or any other symptoms concerning to you.  Abdominal Pain, Adult Many things can cause abdominal pain. Usually, abdominal pain is not caused by a disease and will improve without treatment. It can often be observed and treated at home. Your health care provider will do a physical exam and possibly order blood tests and X-rays to help determine the seriousness of your pain. However, in many cases, more time must pass before a clear cause of the pain can be found. Before that point, your health care provider may not know if you need more testing or further treatment. HOME CARE INSTRUCTIONS Monitor your abdominal pain for any changes. The following actions may help to alleviate any discomfort you are experiencing:  Only take over-the-counter or prescription medicines as directed by your health care provider.  Do not take laxatives unless directed to do so by your health care provider.  Try a clear liquid diet (broth, tea, or water) as directed by your health care provider. Slowly move to a bland diet as tolerated. SEEK MEDICAL CARE IF:  You have unexplained abdominal pain.  You have abdominal pain associated with nausea or diarrhea.  You have pain when you urinate or have a bowel movement.  You experience abdominal pain that wakes you in the night.  You have abdominal pain that is worsened or improved by eating food.  You have  abdominal pain that is worsened with eating fatty foods.  You have a fever. SEEK IMMEDIATE MEDICAL CARE IF:  Your pain does not go away within 2 hours.  You keep throwing up (vomiting).  Your pain is felt only in portions of the abdomen, such as the right side or the left lower portion of the abdomen.  You pass bloody or black tarry stools. MAKE SURE YOU:  Understand these instructions.  Will watch your condition.  Will get help right away if you are not doing well or get worse.   This information is not intended to replace advice given to you by your health care provider. Make sure you discuss any questions you have with your health care provider.   Document Released: 05/21/2005 Document Revised: 05/02/2015 Document Reviewed: 04/20/2013 Elsevier Interactive Patient Education [employer]2016 Elsevier Inc.

## 2016-01-29 NOTE — ED Notes (Addendum)
Patient presents to the ED with right upper quadrant pain for 2-3 days.  Patient reports a history of blood pressure and states, "I'm supposed to take medicine for it but I don't."  Patient reports increased pain with coughing, laughing, and moving.  Patient denies increased pain after eating.  Patient reports history of diverticulitis.  Patient denies nausea, vomiting, and diarrhea.  Patient states he moved furniture on Sunday and pain began after that occurred.

## 2016-01-29 NOTE — ED Notes (Signed)
MD at bedside. 

## 2016-01-29 NOTE — ED Provider Notes (Signed)
[facility] Emergency Department Provider Note  ____________________________________________  Time seen: Approximately 6:05 PM  I have reviewed the triage vital signs and the nursing notes.   HISTORY  Chief Complaint Abdominal Pain    HPI JAMARIOUS FEBO is a 40 y.o. male with no history of surgery, hypertension with medication noncompliance, presenting with right upper quadrant pain. The patient reports that for the past 2-3 days, he hasn't had an intermittent "dull" right upper quadrant pain without any associated nausea or vomiting, diarrhea, fever or chills. He has not had any chest pain, shortness of breath or palpitations. The pain is not worse with food, but he does notice that is worse if he lays down, if he walks around, and when he coughs. He has not tried any medication for his pain. Last bowel movement was today and it was normal.   Past Medical History  Diagnosis Date  . Diverticulitis   . Hypertension   . Asthma     Patient Active Problem List   Diagnosis Date Noted  . Intractable nausea and vomiting 05/31/2015    Past Surgical History  Procedure Laterality Date  . Ankle surgery      Current Outpatient Rx  Name  Route  Sig  Dispense  Refill  . metoprolol tartrate (LOPRESSOR) 25 MG tablet   Oral   Take 1 tablet (25 mg total) by mouth 2 (two) times daily.   20 tablet   0   . metroNIDAZOLE (FLAGYL) 500 MG tablet   Oral   Take 1 tablet (500 mg total) by mouth 2 (two) times daily. Patient not taking: Reported on 05/30/2015   20 tablet   0   . metroNIDAZOLE (FLAGYL) 500 MG tablet   Oral   Take 1 tablet (500 mg total) by mouth 2 (two) times daily.   20 tablet   0   . ondansetron (ZOFRAN) 4 MG tablet   Oral   Take 1 tablet (4 mg total) by mouth daily as needed for nausea or vomiting.   10 tablet   0   . oxyCODONE-acetaminophen (PERCOCET) 5-325 MG tablet   Oral   Take 2 tablets by mouth every 6 (six) hours as needed for  moderate pain or severe pain. Patient not taking: Reported on 05/30/2015   20 tablet   0   . oxyCODONE-acetaminophen (PERCOCET) 5-325 MG tablet   Oral   Take 2 tablets by mouth every 6 (six) hours as needed for moderate pain or severe pain.   20 tablet   0   . promethazine (PHENERGAN) 25 MG tablet   Oral   Take 1 tablet (25 mg total) by mouth every 6 (six) hours as needed for nausea or vomiting.   8 tablet   0     Allergies Review of patient's allergies indicates no known allergies.  No family history on file.  Social History Social History  Substance Use Topics  . Smoking status: Current Every Day Smoker -- 0.50 packs/day    Types: Cigarettes  . Smokeless tobacco: None  . Alcohol Use: Yes     Comment: occas    Review of Systems Constitutional: No fever/chills. Eyes: No visual changes. ENT: No sore throat. No congestion or rhinorrhea. Cardiovascular: Denies chest pain. Denies palpitations. Respiratory: Denies shortness of breath.  No cough. Gastrointestinal: Positive abdominal pain.  No nausea, no vomiting.  No diarrhea.  No constipation. Genitourinary: Negative for dysuria. Musculoskeletal: Negative for back pain. Skin: Negative for rash. Neurological: Negative  for headaches. No focal numbness, tingling or weakness.   10-point ROS otherwise negative.  ____________________________________________   PHYSICAL EXAM:  VITAL SIGNS: ED Triage Vitals  Enc Vitals Group     BP 01/29/16 1602 187/98 mmHg     Pulse Rate 01/29/16 1602 87     Resp 01/29/16 1602 18     Temp 01/29/16 1602 98.2 F (36.8 C)     Temp Source 01/29/16 1602 Oral     SpO2 01/29/16 1602 97 %     Weight 01/29/16 1602 250 lb (113.399 kg)     Height 01/29/16 1602 5\' 6"  (1.676 m)     Head Cir --      Peak Flow --      Pain Score 01/29/16 1602 10     Pain Loc --      Pain Edu? --      Excl. in GC? --     Constitutional: Alert and oriented. Well appearing and in no acute distress. Answers  questions appropriately. Eyes: Conjunctivae are normal.  EOMI. No scleral icterus. Head: Atraumatic. Nose: No congestion/rhinnorhea. Mouth/Throat: Mucous membranes are moist.  Neck: No stridor.  Supple.  No meningismus. Cardiovascular: Normal rate, regular rhythm. No murmurs, rubs or gallops.  Respiratory: Normal respiratory effort.  No accessory muscle use or retractions. Lungs CTAB.  No wheezes, rales or ronchi. Gastrointestinal: Soft and nondistended.  Tenderness to palpation in the right upper quadrant with a positive Murphy sign. No guarding or rebound.  No peritoneal signs. Musculoskeletal: No LE edema. No ttp in the calves or palpable cords.  Negative Homan's sign. Neurologic:  A&Ox3.  Speech is clear.  Face and smile are symmetric.  EOMI.  Moves all extremities well. Skin:  Skin is warm, dry and intact. No rash noted. Psychiatric: Mood and affect are normal. Speech and behavior are normal.  Normal judgement.  ____________________________________________   LABS (all labs ordered are listed, but only abnormal results are displayed)  Labs Reviewed  COMPREHENSIVE METABOLIC PANEL - Abnormal; Notable for the following:    Glucose, Bld 165 (*)    Creatinine, Ser 1.47 (*)    GFR calc non Af Amer 58 (*)    All other components within normal limits  CBC - Abnormal; Notable for the following:    RDW 15.4 (*)    All other components within normal limits  URINALYSIS COMPLETEWITH MICROSCOPIC ([facility] ONLY) - Abnormal; Notable for the following:    Color, Urine YELLOW (*)    APPearance CLEAR (*)    Glucose, UA 50 (*)    Ketones, ur TRACE (*)    Protein, ur 100 (*)    Squamous Epithelial / LPF 0-5 (*)    All other components within normal limits  LIPASE, BLOOD  TROPONIN I   ____________________________________________  EKG  ED ECG REPORT I, Rockne MenghiniNorman, Anne-Caroline, the attending physician, personally viewed and interpreted this ECG.   Date: 01/29/2016  EKG Time: 1608  Rate: 89   Rhythm: normal sinus rhythm  Axis: normal  Intervals:none  ST&T Change: 1mm ST elevation V1, V2, V3 with twave inversions V5 and V6.  No STEMI.  This EKG is compared to 12/16 and is grossly unchanged in morphology.  ____________________________________________  RADIOLOGY  Dg Chest 2 View  01/29/2016  CLINICAL DATA:  Shortness of breath and right-sided chest and abdominal pain for 2 days. Asthma. Hypertension. EXAM: CHEST  2 VIEW COMPARISON:  08/11/2015 FINDINGS: The heart size and mediastinal contours are within normal limits. Both lungs are  clear. The visualized skeletal structures are unremarkable. IMPRESSION: No active cardiopulmonary disease. Electronically Signed   By: Myles Rosenthal M.D.   On: 01/29/2016 16:30   US Abdomen Limited Ruq  01/29/2016  CLINICAL DATA:  Right upper quadrant pain for 2 days, initial encounter EXAM: US ABDOMEN LIMITED - RIGHT UPPER QUADRANT COMPARISON:  08/11/2015 CT of the abdomen and pelvis FINDINGS: Gallbladder: The gallbladder is contracted. The wall is thickened although related to the contracted state. No cholelithiasis is seen. Common bile duct: Diameter: 3.3 mm. Liver: Tiny hyperechoic lesion is noted within the right lobe measuring 1.1 cm. This likely represents a hemangioma. No other focal lesion is noted. IMPRESSION: Tiny hepatic hemangioma. Contracted gallbladder without evidence of gallstones. Electronically Signed   By: Alcide Clever M.D.   On: 01/29/2016 18:58    ____________________________________________   PROCEDURES  Procedure(s) performed: None  Critical Care performed: No ____________________________________________   INITIAL IMPRESSION / ASSESSMENT AND PLAN / ED COURSE  Pertinent labs & imaging results that were available during my care of the patient were reviewed by me and considered in my medical decision making (see chart for details).  40 y.o. male with obesity and untreated hypertension presenting with right upper quadrant pain  with no other associated symptoms. Overall, the patient is well-appearing but he does have significant tenderness in the right upper quadrant. He does not have a fever or white blood cell count that is elevated, his LFTs are normal, we'll get an ultrasound to evaluate for gallbladder disease. The patient does have an abnormal EKG today, but is not having any chest pain, and his EKG is similar to previous. I do not think the etiology of his pain is ACS or MI.  ----------------------------------------- 7:08 PM on 01/29/2016 -----------------------------------------  The patient's ultrasound does not show any gallbladder disease. He has no elevation in his white blood cell count, nor any abnormal LFTs. His chest x-ray is normal, his EKG is stable from previous, and his troponin is negative today. I will plan to discharge the patient home. He understands that he has chronic renal insufficiency as well as proteinuria, and is imperative to start treating his high blood pressure and to follow-up with her primary care physician for further evaluation. He understands return precautions as well as follow-up instructions. ____________________________________________  FINAL CLINICAL IMPRESSION(S) / ED DIAGNOSES  Final diagnoses:  Right upper quadrant pain  Essential hypertension  Proteinuria      NEW MEDICATIONS STARTED DURING THIS VISIT:  New Prescriptions   METOPROLOL TARTRATE (LOPRESSOR) 25 MG TABLET    Take 1 tablet (25 mg total) by mouth 2 (two) times daily.     Rockne Menghini, MD 01/29/16 1909

## 2016-04-18 ENCOUNTER — Emergency Department: Payer: Self-pay

## 2016-04-18 ENCOUNTER — Encounter: Payer: Self-pay | Admitting: Emergency Medicine

## 2016-04-18 ENCOUNTER — Inpatient Hospital Stay
Admission: EM | Admit: 2016-04-18 | Discharge: 2016-04-19 | DRG: 305 | Disposition: A | Discharge status: Home / Self Care | Payer: Self-pay | Attending: Internal Medicine | Admitting: Internal Medicine

## 2016-04-18 DIAGNOSIS — F17200 Nicotine dependence, unspecified, uncomplicated: Secondary | ICD-10-CM | POA: Diagnosis present

## 2016-04-18 DIAGNOSIS — Z79899 Other long term (current) drug therapy: Secondary | ICD-10-CM

## 2016-04-18 DIAGNOSIS — J45909 Unspecified asthma, uncomplicated: Secondary | ICD-10-CM | POA: Diagnosis present

## 2016-04-18 DIAGNOSIS — Z8249 Family history of ischemic heart disease and other diseases of the circulatory system: Secondary | ICD-10-CM

## 2016-04-18 DIAGNOSIS — K5792 Diverticulitis of intestine, part unspecified, without perforation or abscess without bleeding: Secondary | ICD-10-CM

## 2016-04-18 DIAGNOSIS — F17210 Nicotine dependence, cigarettes, uncomplicated: Secondary | ICD-10-CM | POA: Diagnosis present

## 2016-04-18 DIAGNOSIS — I160 Hypertensive urgency: Principal | ICD-10-CM | POA: Diagnosis present

## 2016-04-18 DIAGNOSIS — I161 Hypertensive emergency: Secondary | ICD-10-CM

## 2016-04-18 DIAGNOSIS — R1032 Left lower quadrant pain: Secondary | ICD-10-CM

## 2016-04-18 DIAGNOSIS — R112 Nausea with vomiting, unspecified: Secondary | ICD-10-CM

## 2016-04-18 DIAGNOSIS — F1410 Cocaine abuse, uncomplicated: Secondary | ICD-10-CM | POA: Diagnosis present

## 2016-04-18 DIAGNOSIS — Z9114 Patient's other noncompliance with medication regimen: Secondary | ICD-10-CM

## 2016-04-18 DIAGNOSIS — I10 Essential (primary) hypertension: Secondary | ICD-10-CM | POA: Diagnosis present

## 2016-04-18 DIAGNOSIS — F1210 Cannabis abuse, uncomplicated: Secondary | ICD-10-CM | POA: Diagnosis present

## 2016-04-18 DIAGNOSIS — Z9889 Other specified postprocedural states: Secondary | ICD-10-CM

## 2016-04-18 LAB — COMPREHENSIVE METABOLIC PANEL
ALBUMIN: 4.3 g/dL (ref 3.5–5.0)
ALK PHOS: 69 U/L (ref 38–126)
ALT: 17 U/L (ref 17–63)
ANION GAP: 8 (ref 5–15)
AST: 21 U/L (ref 15–41)
BUN: 14 mg/dL (ref 6–20)
CALCIUM: 9.2 mg/dL (ref 8.9–10.3)
CHLORIDE: 105 mmol/L (ref 101–111)
CO2: 23 mmol/L (ref 22–32)
Creatinine, Ser: 1.02 mg/dL (ref 0.61–1.24)
GFR calc Af Amer: >60 mL/min (ref >60)
GFR calc non Af Amer: >60 mL/min (ref >60)
GLUCOSE: 132 mg/dL — AB (ref 65–99)
POTASSIUM: 3.7 mmol/L (ref 3.5–5.1)
SODIUM: 136 mmol/L (ref 135–145)
Total Bilirubin: 0.4 mg/dL (ref 0.3–1.2)
Total Protein: 7.9 g/dL (ref 6.5–8.1)

## 2016-04-18 LAB — TROPONIN I: Troponin I: <0.03 ng/mL (ref <0.03)

## 2016-04-18 LAB — URINE DRUG SCREEN, QUALITATIVE (ARMC ONLY)
AMPHETAMINES, UR SCREEN: NOT DETECTED
BENZODIAZEPINE, UR SCRN: NOT DETECTED
Barbiturates, Ur Screen: NOT DETECTED
CANNABINOID 50 NG, UR ~~LOC~~: POSITIVE — AB
Cocaine Metabolite,Ur ~~LOC~~: POSITIVE — AB
MDMA (ECSTASY) UR SCREEN: NOT DETECTED
Methadone Scn, Ur: NOT DETECTED
Opiate, Ur Screen: NOT DETECTED
PHENCYCLIDINE (PCP) UR S: NOT DETECTED
TRICYCLIC, UR SCREEN: NOT DETECTED

## 2016-04-18 LAB — URINALYSIS COMPLETE WITH MICROSCOPIC (ARMC ONLY)
BACTERIA UA: NONE SEEN
Bilirubin Urine: NEGATIVE
GLUCOSE, UA: 50 mg/dL — AB
Hgb urine dipstick: NEGATIVE
Ketones, ur: NEGATIVE mg/dL
Leukocytes, UA: NEGATIVE
Nitrite: NEGATIVE
PROTEIN: 30 mg/dL — AB
SQUAMOUS EPITHELIAL / LPF: NONE SEEN
Specific Gravity, Urine: 1.013 (ref 1.005–1.030)
pH: 7 (ref 5.0–8.0)

## 2016-04-18 LAB — CBC
HEMATOCRIT: 47.8 % (ref 40.0–52.0)
HEMOGLOBIN: 16.2 g/dL (ref 13.0–18.0)
MCH: 27.3 pg (ref 26.0–34.0)
MCHC: 33.9 g/dL (ref 32.0–36.0)
MCV: 80.7 fL (ref 80.0–100.0)
Platelets: 239 10*3/uL (ref 150–440)
RBC: 5.92 MIL/uL — AB (ref 4.40–5.90)
RDW: 15.3 % — ABNORMAL HIGH (ref 11.5–14.5)
WBC: 8.9 10*3/uL (ref 3.8–10.6)

## 2016-04-18 LAB — LIPASE, BLOOD: LIPASE: 21 U/L (ref 11–51)

## 2016-04-18 MED ORDER — LISINOPRIL 10 MG PO TABS
10.0000 mg | ORAL_TABLET | Freq: Two times a day (BID) | ORAL | Status: DC
  Administered 2016-04-18 – 2016-04-19 (×2): 10 mg via ORAL
  Filled 2016-04-18 (×2): qty 1

## 2016-04-18 MED ORDER — HYDRALAZINE HCL 20 MG/ML IJ SOLN: 10.0000 mg | Freq: Four times a day (QID) | INTRAMUSCULAR | Status: DC | PRN

## 2016-04-18 MED ORDER — ONDANSETRON HCL 4 MG/2ML IJ SOLN
4.0000 mg | Freq: Once | INTRAMUSCULAR | Status: AC | PRN
  Administered 2016-04-18: 4 mg via INTRAVENOUS
  Filled 2016-04-18: qty 2

## 2016-04-18 MED ORDER — ACETAMINOPHEN 650 MG RE SUPP: 650.0000 mg | Freq: Four times a day (QID) | RECTAL | Status: DC | PRN

## 2016-04-18 MED ORDER — SODIUM CHLORIDE 0.9 % IV SOLN
Freq: Once | INTRAVENOUS | Status: AC
  Administered 2016-04-18: 21:00:00 via INTRAVENOUS

## 2016-04-18 MED ORDER — AMOXICILLIN-POT CLAVULANATE 875-125 MG PO TABS
1.0000 | ORAL_TABLET | Freq: Once | ORAL | Status: AC
  Administered 2016-04-18: 1 via ORAL
  Filled 2016-04-18: qty 1

## 2016-04-18 MED ORDER — HYDRALAZINE HCL 20 MG/ML IJ SOLN
10.0000 mg | Freq: Once | INTRAMUSCULAR | Status: AC
  Administered 2016-04-18: 10 mg via INTRAVENOUS
  Filled 2016-04-18: qty 1

## 2016-04-18 MED ORDER — MORPHINE SULFATE (PF) 4 MG/ML IV SOLN
4.0000 mg | Freq: Once | INTRAVENOUS | Status: AC
  Administered 2016-04-18: 4 mg via INTRAVENOUS

## 2016-04-18 MED ORDER — MORPHINE SULFATE (PF) 4 MG/ML IV SOLN
INTRAVENOUS | Status: AC
  Filled 2016-04-18: qty 1

## 2016-04-18 MED ORDER — ONDANSETRON HCL 4 MG PO TABS: 4.0000 mg | ORAL_TABLET | Freq: Four times a day (QID) | ORAL | Status: DC | PRN

## 2016-04-18 MED ORDER — FENTANYL CITRATE (PF) 100 MCG/2ML IJ SOLN
50.0000 ug | INTRAMUSCULAR | Status: DC | PRN
  Administered 2016-04-18: 50 ug via INTRAVENOUS

## 2016-04-18 MED ORDER — ENOXAPARIN SODIUM 40 MG/0.4ML ~~LOC~~ SOLN
40.0000 mg | Freq: Two times a day (BID) | SUBCUTANEOUS | Status: DC
  Administered 2016-04-18: 40 mg via SUBCUTANEOUS
  Filled 2016-04-18: qty 0.4

## 2016-04-18 MED ORDER — NITROGLYCERIN 2 % TD OINT
0.5000 [in_us] | TOPICAL_OINTMENT | Freq: Three times a day (TID) | TRANSDERMAL | Status: DC
  Administered 2016-04-18 – 2016-04-19 (×2): 0.5 [in_us] via TOPICAL
  Filled 2016-04-18 (×2): qty 1

## 2016-04-18 MED ORDER — OXYCODONE HCL 5 MG PO TABS
10.0000 mg | ORAL_TABLET | Freq: Once | ORAL | Status: DC
  Filled 2016-04-18: qty 2

## 2016-04-18 MED ORDER — ACETAMINOPHEN 325 MG PO TABS: 650.0000 mg | ORAL_TABLET | Freq: Four times a day (QID) | ORAL | Status: DC | PRN

## 2016-04-18 MED ORDER — METRONIDAZOLE 500 MG PO TABS
500.0000 mg | ORAL_TABLET | Freq: Once | ORAL | Status: AC
  Administered 2016-04-18: 500 mg via ORAL
  Filled 2016-04-18: qty 1

## 2016-04-18 MED ORDER — ONDANSETRON HCL 4 MG/2ML IJ SOLN
4.0000 mg | Freq: Four times a day (QID) | INTRAMUSCULAR | Status: DC | PRN
  Administered 2016-04-18 – 2016-04-19 (×3): 4 mg via INTRAVENOUS
  Filled 2016-04-18 (×3): qty 2

## 2016-04-18 MED ORDER — FENTANYL CITRATE (PF) 100 MCG/2ML IJ SOLN
INTRAMUSCULAR | Status: AC
  Filled 2016-04-18: qty 2

## 2016-04-18 MED ORDER — ONDANSETRON HCL 4 MG/2ML IJ SOLN
4.0000 mg | Freq: Once | INTRAMUSCULAR | Status: AC
  Administered 2016-04-18: 4 mg via INTRAVENOUS
  Filled 2016-04-18: qty 2

## 2016-04-18 NOTE — Progress Notes (Signed)
PHARMACIST - PHYSICIAN COMMUNICATION  CONCERNING:  Enoxaparin (Lovenox) for DVT Prophylaxis    RECOMMENDATION: Patient was prescribed enoxaprin 40mg  q24 hours for VTE prophylaxis.   Filed Weights   04/18/16 1416 04/18/16 1422  Weight: 250 lb (113.4 kg) 250 lb (113.4 kg)    Body mass index is 40.35 kg/m.  Estimated Creatinine Clearance: 113.8 mL/min (by C-G formula based on SCr of 1.02 mg/dL).   [facility] patient is candidate for enoxaparin 40mg  every 12 hour dosing due to BMI being >40.   DESCRIPTION: Pharmacy has adjusted enoxaparin dose per [facility] policy.  Patient is now receiving enoxaparin 40mg  every 12 hours.    Cher NakaiSheema Chasmine Lender, PharmD Clinical Pharmacist  04/18/2016 7:47 PM

## 2016-04-18 NOTE — H&P (Signed)
[facility] [place] [facility]   PATIENT NAME: Brad PolkaFrank Mezquita    MR#:  [id number]  DATE OF BIRTH:  1976/07/23  DATE OF ADMISSION:  04/18/2016  PRIMARY CARE PHYSICIAN: No primary care provider on file.   REQUESTING/REFERRING PHYSICIAN: Dr Inocencio Homesgayle  CHIEF COMPLAINT:  Intractable vomiting  HISTORY OF PRESENT ILLNESS:  Brad [name]  is a 40 y.o. male with a known history of Malignant hypertension, polysubstance abuse with cocaine and marijuana and tobacco abuse comes to the emergency room after intractable episodes of vomiting throughout today. His overall not feeling well. He has not been taking his blood pressure medicine for last 3-4 days. Patient's blood pressure and her arrival was 195 to 1:30 when opted to the 200 systolic and diastolic remained in the 130s. He received 2 doses of IV hydralazine and blood pressure during my evaluation was 167/106 EKG showing some ST depression in lead 123 aVL and aVF V4 V5 V6. Patient is denying any chest pain. He is being admitted for hypertensive urgency. Patient's urine drug screen is positive for cocaine and marijuana  PAST MEDICAL HISTORY:   Past Medical History:  Diagnosis Date  . Asthma   . Diverticulitis   . Hypertension     PAST SURGICAL HISTOIRY:   Past Surgical History:  Procedure Laterality Date  . ANKLE SURGERY      SOCIAL HISTORY:   Social History  Substance Use Topics  . Smoking status: Current Every Day Smoker    Packs/day: 0.50    Types: Cigarettes  . Smokeless tobacco: Never Used  . Alcohol use Yes     Comment: occas    FAMILY HISTORY:  Hypertension in mother  DRUG ALLERGIES:  No Known Allergies  REVIEW OF SYSTEMS:  Review of Systems  Constitutional: Negative for chills, fever and weight loss.  HENT: Negative for ear discharge, ear pain and nosebleeds.   Eyes: Negative for blurred vision, pain and discharge.  Respiratory: Negative for sputum production, shortness of breath,  wheezing and stridor.   Cardiovascular: Negative for chest pain, palpitations, orthopnea and PND.  Gastrointestinal: Positive for nausea and vomiting. Negative for abdominal pain and diarrhea.  Genitourinary: Negative for frequency and urgency.  Musculoskeletal: Negative for back pain and joint pain.  Neurological: Positive for weakness. Negative for sensory change, speech change and focal weakness.  Psychiatric/Behavioral: Negative for depression and hallucinations. The patient is not nervous/anxious.      MEDICATIONS AT HOME:   Prior to Admission medications   Medication Sig Start Date End Date Taking? Authorizing Provider  metoprolol tartrate (LOPRESSOR) 25 MG tablet Take 1 tablet (25 mg total) by mouth 2 (two) times daily. 01/29/16   Anne-Caroline Sharma CovertNorman, MD  metroNIDAZOLE (FLAGYL) 500 MG tablet Take 1 tablet (500 mg total) by mouth 2 (two) times daily. Patient not taking: Reported on 05/30/2015 05/23/15   Emily FilbertJonathan E Williams, MD  metroNIDAZOLE (FLAGYL) 500 MG tablet Take 1 tablet (500 mg total) by mouth 2 (two) times daily. 05/29/15   Emily FilbertJonathan E Williams, MD  ondansetron (ZOFRAN) 4 MG tablet Take 1 tablet (4 mg total) by mouth daily as needed for nausea or vomiting. 05/30/15   Darien Ramusavid W Kaminski, MD  oxyCODONE-acetaminophen (PERCOCET) 5-325 MG tablet Take 2 tablets by mouth every 6 (six) hours as needed for moderate pain or severe pain. Patient not taking: Reported on 05/30/2015 05/23/15   Emily FilbertJonathan E Williams, MD  oxyCODONE-acetaminophen (PERCOCET) 5-325 MG tablet Take 2 tablets by mouth every 6 (six) hours as  needed for moderate pain or severe pain. 05/29/15   Emily Filbert, MD  promethazine (PHENERGAN) 25 MG tablet Take 1 tablet (25 mg total) by mouth every 6 (six) hours as needed for nausea or vomiting. 05/30/15   Darien Ramus, MD      VITAL SIGNS:  Blood pressure (!) 186/106, pulse 63, temperature 98.1 F (36.7 C), temperature source Oral, resp. rate (!) 29, height 5\' 6"  (1.676 m),  weight 250 lb (113.4 kg), SpO2 93 %.  PHYSICAL EXAMINATION:  GENERAL:  40 y.o.-year-old patient lying in the bed withMild   acute distress.  EYES: Pupils equal, round, reactive to light and accommodation. No scleral icterus. Extraocular muscles intact.  HEENT: Head atraumatic, normocephalic. Oropharynx and nasopharynx clear.  NECK:  Supple, no jugular venous distention. No thyroid enlargement, no tenderness.  LUNGS: Normal breath sounds bilaterally, no wheezing, rales,rhonchi or crepitation. No use of accessory muscles of respiration.  CARDIOVASCULAR: S1, S2 normal. No murmurs, rubs, or gallops.  ABDOMEN: Soft, nontender, nondistended. Bowel sounds present. No organomegaly or mass.  EXTREMITIES: No pedal edema, cyanosis, or clubbing.  NEUROLOGIC: Cranial nerves II through XII are intact. Muscle strength 5/5 in all extremities. Sensation intact. Gait not checked.  PSYCHIATRIC: The patient is alert and oriented x 3.  SKIN: No obvious rash, lesion, or ulcer.   LABORATORY PANEL:   CBC  Recent Labs Lab 04/18/16 1423  WBC 8.9  HGB 16.2  HCT 47.8  PLT 239   ------------------------------------------------------------------------------------------------------------------  Chemistries   Recent Labs Lab 04/18/16 1423  NA 136  K 3.7  CL 105  CO2 23  GLUCOSE 132*  BUN 14  CREATININE 1.02  CALCIUM 9.2  AST 21  ALT 17  ALKPHOS 69  BILITOT 0.4   ------------------------------------------------------------------------------------------------------------------  Cardiac Enzymes  Recent Labs Lab 04/18/16 1423  TROPONINI <0.03   ------------------------------------------------------------------------------------------------------------------  RADIOLOGY:  Dg Abdomen Acute W/chest  Result Date: 04/18/2016 CLINICAL DATA:  Shortness of breath, lightheadedness, vomiting, abdominal pain EXAM: DG ABDOMEN ACUTE W/ 1V CHEST COMPARISON:  Chest radiograph 01/29/2016, CT abdomen/ pelvis  08/11/2015 FINDINGS: Cardiomediastinal silhouette is mildly enlarged. No focal airspace disease or pulmonary edema. No pneumothorax or sizable pleural effusion. There is no free intraperitoneal air. No dilated loops of small bowel are identified. The bowel gas pattern is normal. No abdominal mass or radiopaque calcification is identified. IMPRESSION: Negative abdominal radiographs.  No acute cardiopulmonary disease. Electronically Signed   By: Deatra Robinson M.D.   On: 04/18/2016 18:07    EKG:  Normal sinus rhythm, ST depression in 1-3 aVL aVF V4 V5 V6. LVH changes in lateral leads   IMPRESSION AND PLAN:   Iam Lipson  is a 40 y.o. male with a known history of Malignant hypertension, polysubstance abuse with cocaine and marijuana and tobacco abuse comes to the emergency room after intractable episodes of vomiting throughout today. His overall not feeling well. He has not been taking his blood pressure medicine for last 3-4 days.  1. Hypertensive urgency patient's comes in with systolic blood pressure anywhere to 200s to 180s diastolic 106 to 130s  -Admit to telemetry  -IV when necessary hydralazine  -Start on by mouth lisinopril and Nitropaste half an inch every 8 hourly  -Avoid beat blockers in the setting of using cocaine  -Echo of the heart 0 cycle cardiac enzymes 3  2. Polysubstance abuse with cocaine and marijuana -Patient advised abstinence  3. Tobacco abuse Smoking cessation advised 3 minutes spent   All the records are reviewed  and case discussed with ED provider. Management plans discussed with the patient, family and they are in agreement.  CODE STATUS: FULL  TOTAL CRITICALTIME TAKING CARE OF THIS PATIENT: 50 minutes.    Bairon Klemann M.D on 04/18/2016 at 6:35 PM  Between 7am to 6pm - Pager - [phone]  After 6pm go to [email or web address] - password [email or web address] [facility]  [facility]  Office  [phone]  CC: Primary care physician; No primary care provider on  file.

## 2016-04-18 NOTE — ED Provider Notes (Signed)
[facility] Provider Note   ____________________________________________   First MD Initiated Contact with Patient 04/18/16 1636     (approximate)  I have reviewed the triage vital signs and the nursing notes.   HISTORY  Chief Complaint Abdominal Pain and Emesis    HPI Brad [name] is a 40 y.o. male with history of asthma, hypertension, previous admissions for intractable nausea and vomiting, history of cocaine abuse, history of diverticulitis who presents for evaluation of multiple episodes of nonbloody nonbilious emesis today, gradual onset, constant, severe, no modifying factors. He is also had left lower quadrant pain which he reports is severe and similar to his prior episodes of diverticulitis. He denies any chest pain or difficulty breathing. No diarrhea, he had a normal bowel movement yesterday. No pain or burning with urination.   Past Medical History:  Diagnosis Date  . Asthma   . Diverticulitis   . Hypertension     Patient Active Problem List   Diagnosis Date Noted  . 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 Intractable nausea and vomiting 05/31/2015    Past Surgical History:  Procedure Laterality Date  . ANKLE SURGERY      Prior to Admission medications   Medication Sig Start Date End Date Taking? Authorizing Provider  metoprolol tartrate (LOPRESSOR) 25 MG tablet Take 1 tablet (25 mg total) by mouth 2 (two) times daily. 01/29/16   Anne-Caroline Sharma Covert, MD  metroNIDAZOLE (FLAGYL) 500 MG tablet Take 1 tablet (500 mg total) by mouth 2 (two) times daily. Patient not taking: Reported on 05/30/2015 05/23/15   Emily Filbert, MD  metroNIDAZOLE (FLAGYL) 500 MG tablet Take 1 tablet (500 mg total) by mouth 2 (two) times daily. 05/29/15   Emily Filbert, MD  ondansetron (ZOFRAN) 4 MG tablet Take 1 tablet (4 mg total) by mouth daily as needed for nausea or vomiting. 05/30/15   Darien Ramus, MD    oxyCODONE-acetaminophen (PERCOCET) 5-325 MG tablet Take 2 tablets by mouth every 6 (six) hours as needed for moderate pain or severe pain. Patient not taking: Reported on 05/30/2015 05/23/15   Emily Filbert, MD  oxyCODONE-acetaminophen (PERCOCET) 5-325 MG tablet Take 2 tablets by mouth every 6 (six) hours as needed for moderate pain or severe pain. 05/29/15   Emily Filbert, MD  promethazine (PHENERGAN) 25 MG tablet Take 1 tablet (25 mg total) by mouth every 6 (six) hours as needed for nausea or vomiting. 05/30/15   Darien Ramus, MD    Allergies Review of patient's allergies indicates no known allergies.  No family history on file.  Social History Social History  Substance Use Topics  . Smoking status: Current Every Day Smoker    Packs/day: 0.50    Types: Cigarettes  . Smokeless tobacco: Never Used  . Alcohol use Yes     Comment: occas    Review of Systems Constitutional: No fever/chills Eyes: No visual changes. ENT: No sore throat. Cardiovascular: Denies chest pain. Respiratory: Denies shortness of breath. Gastrointestinal: + abdominal pain.  + nausea, + vomiting.  No diarrhea.  No constipation. Genitourinary: Negative for dysuria. Musculoskeletal: Negative for back pain. Skin: Negative for rash. Neurological: Negative for headaches, focal weakness or numbness.  10-point ROS otherwise negative.  ____________________________________________   PHYSICAL EXAM:  Vitals:   04/18/16 1730 04/18/16 1800 04/18/16 1821 04/18/16 1830  BP: (!) 166/108 (!) 180/109 (!) 186/106 (!) 163/99  Pulse: 77 63  81  Resp:    17  Temp:      TempSrc:      SpO2: 97% 93%  95%  Weight:      Height:        VITAL SIGNS: ED Triage Vitals  Enc Vitals Group     BP 04/18/16 1417 (!) 195/130     Pulse Rate 04/18/16 1417 64     Resp 04/18/16 1417 (!) 22     Temp 04/18/16 1417 98.1 F (36.7 C)     Temp Source 04/18/16 1417 Oral     SpO2 04/18/16 1417 97 %     Weight 04/18/16  1416 250 lb (113.4 kg)     Height 04/18/16 1416 5\' 6"  (1.676 m)     Head Circumference --      Peak Flow --      Pain Score 04/18/16 1422 10     Pain Loc --      Pain Edu? --      Excl. in GC? --     Constitutional: Alert and oriented. Nontoxic- appearing and in no acute distress. Eyes: Conjunctivae are normal. PERRL. EOMI. Head: Atraumatic. Nose: No congestion/rhinnorhea. Mouth/Throat: Mucous membranes are moist.  Oropharynx non-erythematous. Neck: No stridor.  Supple without meningismus. Cardiovascular: Normal rate, regular rhythm. Grossly normal heart sounds.  Good peripheral circulation. Respiratory: Normal respiratory effort.  No retractions. Lungs CTAB. Gastrointestinal: Soft with tenderness to palpation in the left lower quadrant, no rebound or guarding. No CVA tenderness. Genitourinary: deferred Musculoskeletal: No lower extremity tenderness nor edema.  No joint effusions. Neurologic:  Normal speech and language. No gross focal neurologic deficits are appreciated. No gait instability. Skin:  Skin is warm, dry and intact. No rash noted. Psychiatric: Mood and affect are normal. Speech and behavior are normal.  ____________________________________________   LABS (all labs ordered are listed, but only abnormal results are displayed)  Labs Reviewed  COMPREHENSIVE METABOLIC PANEL - Abnormal; Notable for the following:       Result Value   Glucose, Bld 132 (*)    All other components within normal limits  CBC - Abnormal; Notable for the following:    RBC 5.92 (*)    RDW 15.3 (*)    All other components within normal limits  URINALYSIS COMPLETEWITH MICROSCOPIC ([facility] ONLY) - Abnormal; Notable for the following:    Color, Urine STRAW (*)    APPearance CLEAR (*)    Glucose, UA 50 (*)    Protein, ur 30 (*)    All other components within normal limits  URINE DRUG SCREEN, QUALITATIVE ([facility] ONLY) - Abnormal; Notable for the following:    Cocaine Metabolite,Ur [place] POSITIVE (*)     Cannabinoid 50 Ng, Ur [place] POSITIVE (*)    All other components within normal limits  LIPASE, BLOOD  TROPONIN I   ____________________________________________  EKG  ED ECG REPORT I, Gayla DossGayle, Nitesh Pitstick A, the attending physician, personally viewed and interpreted this ECG.   Date: 04/18/2016  EKG Time: 16:57  Rate: 63  Rhythm: normal sinus rhythm  Axis: normal  Intervals:none  ST&T Change: No acute ST elevation. ST depression in the inferior and lateral leads with deep T-wave inversions.  ____________________________________________  RADIOLOGY  3 view abdominal plain films IMPRESSION:  Negative abdominal radiographs. No acute cardiopulmonary disease.      ____________________________________________   PROCEDURES  Procedure(s) performed: None  Procedures  Critical Care performed: none  ____________________________________________   INITIAL IMPRESSION / ASSESSMENT AND PLAN / ED COURSE  Pertinent labs & imaging results that were available  during my care of the patient were reviewed by me and considered in my medical decision making (see chart for details).  Brad [name] is a 39 y.o. male with history of asthma, hypertension, previous admissions for intractable nausea and vomiting, history of cocaine use, history of diverticulitis who presents for evaluation of multiple episodes of nonbloody nonbilious emesis today. On exam, he is nontoxic appearing and in no acute distress. He is severely hypertensive with BP as high as 208/120 and EKG is concerning for diffuse ST depression with T-wave inversions concerning for ischemia, concern is for hypertensive emergency, we will give medicines for blood pressure control.  Discussed the case with Dr. Lady Gary of Cardiology who has reviewed the EKG at 5:20 PM and agrees no STEMI. His abdomen is soft but there is tenderness in the left lower quadrant. I suspect this could be recurrent unncomplicated diverticulitis given history of same,  will treat with antibiotics. CBC and CMP are generally unremarkable. Urinalysis is not consistent with infection. Troponin is pending as are 3 view abdominal plain films, anticipate admission given concern for hypertensive emergency.  ----------------------------------------- 6:36 PM on 04/18/2016 ----------------------------------------- Patient is resting but continues to feel poorly. Plain films negative. Blood pressure improving to 180/109, will given an additional dose of hydralazine. I reviewed his labs. CBC, CMP generally unremarkable, negative troponin. Urinalysis is positive for cannabis and cannabinoids, avoid beta blockers. Urinalysis with protein consistent with his kidney disease. Case discussed with the hospitalist for admission at this time.   Clinical Course     ____________________________________________   FINAL CLINICAL IMPRESSION(S) / ED DIAGNOSES  Final diagnoses:  Hypertensive emergency  Non-intractable vomiting with nausea, vomiting of unspecified type  LLQ pain  Diverticulitis of intestine without perforation or abscess without bleeding      NEW MEDICATIONS STARTED DURING THIS VISIT:  New Prescriptions   No medications on file     Note:  This document was prepared using Dragon voice recognition software and may include unintentional dictation errors.    Gayla Doss, MD 04/18/16 [phone]

## 2016-04-18 NOTE — ED Triage Notes (Signed)
C/O abdominal pain lower abdominal pain and vomiting.  Onset of symptoms this morning.  Patient has history of diverticulitis and states pain is similar.

## 2016-04-18 NOTE — ED Notes (Signed)
MD at bedside. 

## 2016-04-19 ENCOUNTER — Inpatient Hospital Stay
Admit: 2016-04-19 | Discharge: 2016-04-19 | Disposition: A | Discharge status: Home / Self Care | Payer: Self-pay | Attending: Internal Medicine | Admitting: Internal Medicine

## 2016-04-19 LAB — ECHOCARDIOGRAM COMPLETE
HEIGHTINCHES: 66 in
Weight: 3880 oz

## 2016-04-19 LAB — TROPONIN I

## 2016-04-19 MED ORDER — LISINOPRIL 10 MG PO TABS: 10.0000 mg | ORAL_TABLET | Freq: Every day | ORAL | 0 refills | Status: DC

## 2016-04-19 MED ORDER — ENOXAPARIN SODIUM 40 MG/0.4ML ~~LOC~~ SOLN: 40.0000 mg | SUBCUTANEOUS | Status: DC

## 2016-04-19 NOTE — Discharge Summary (Signed)
[employer] [facility] [place] at [facility]   PATIENT NAME: Brad [name]    MR#:  [id number]  DATE OF BIRTH:  Jan 10, 1976  DATE OF ADMISSION:  04/18/2016 ADMITTING PHYSICIAN: Enedina Finner, MD  DATE OF DISCHARGE: 04/19/16  PRIMARY CARE PHYSICIAN: No primary care provider on file.    ADMISSION DIAGNOSIS:   Hypertensive urgency Non-intractable vomiting with nausea, vomiting of unspecified type [R11.2]  DISCHARGE DIAGNOSIS:  Active Problems:   Malignant hypertensive urgency nausea/vomiting resolved  SECONDARY DIAGNOSIS:   Past Medical History:  Diagnosis Date  . Asthma   . Diverticulitis   . Hypertension     HOSPITAL COURSE:  Brad [name]  is a 40 y.o. male admitted 04/18/2016 with chief complaint Abdominal Pain and Emesis . Please see H&P performed by Enedina Finner, MD for further information. Patient presented with the above symptoms found to have elevated BP. SBP 200s Patient states non compliance with meds as well as cocaine usage  DISCHARGE CONDITIONS:   stable  CONSULTS OBTAINED:    DRUG ALLERGIES:  No Known Allergies  DISCHARGE MEDICATIONS:   Current Discharge Medication List    START taking these medications   Details  lisinopril (PRINIVIL,ZESTRIL) 10 MG tablet Take 1 tablet (10 mg total) by mouth daily. Qty: 30 tablet, Refills: 0      CONTINUE these medications which have NOT CHANGED   Details  metoprolol tartrate (LOPRESSOR) 25 MG tablet Take 1 tablet (25 mg total) by mouth 2 (two) times daily. Qty: 20 tablet, Refills: 0    ondansetron (ZOFRAN) 4 MG tablet Take 1 tablet (4 mg total) by mouth daily as needed for nausea or vomiting. Qty: 10 tablet, Refills: 0    oxyCODONE-acetaminophen (PERCOCET) 5-325 MG tablet Take 2 tablets by mouth every 6 (six) hours as needed for moderate pain or severe pain. Qty: 20 tablet, Refills: 0    promethazine (PHENERGAN) 25 MG tablet Take 1 tablet (25 mg total) by mouth every 6 (six) hours as needed for  nausea or vomiting. Qty: 8 tablet, Refills: 0      STOP taking these medications     metroNIDAZOLE (FLAGYL) 500 MG tablet      metroNIDAZOLE (FLAGYL) 500 MG tablet          DISCHARGE INSTRUCTIONS:    DIET:  Cardiac diet  DISCHARGE CONDITION:  Stable  ACTIVITY:  Activity as tolerated  OXYGEN:  Home Oxygen: No.   Oxygen Delivery: room air  DISCHARGE LOCATION:  home   If you experience worsening of your admission symptoms, develop shortness of breath, life threatening emergency, suicidal or homicidal thoughts you must seek medical attention immediately by calling 911 or calling your MD immediately  if symptoms less severe.  You Must read complete instructions/literature along with all the possible adverse reactions/side effects for all the Medicines you take and that have been prescribed to you. Take any new Medicines after you have completely understood and accpet all the possible adverse reactions/side effects.   Please note  You were cared for by a hospitalist during your hospital stay. If you have any questions about your discharge medications or the care you received while you were in the hospital after you are discharged, you can call the unit and asked to speak with the hospitalist on call if the hospitalist that took care of you is not available. Once you are discharged, your primary care physician will handle any further medical issues. Please note that NO REFILLS for any discharge medications will be  authorized once you are discharged, as it is imperative that you return to your primary care physician (or establish a relationship with a primary care physician if you do not have one) for your aftercare needs so that they can reassess your need for medications and monitor your lab values.    On the day of Discharge:   VITAL SIGNS:  Blood pressure (!) 153/71, pulse 68, temperature 98.2 F (36.8 C), temperature source Oral, resp. rate 18, height 5\' 6"  (1.676 m),  weight 110 kg (242 lb 8 oz), SpO2 95 %.  I/O:   Intake/Output Summary (Last 24 hours) at 04/19/16 1029 Last data filed at 04/19/16 0808  Gross per 24 hour  Intake                0 ml  Output              250 ml  Net             -250 ml    PHYSICAL EXAMINATION:  GENERAL:  40 y.o.-year-old patient lying in the bed with no acute distress.  EYES: Pupils equal, round, reactive to light and accommodation. No scleral icterus. Extraocular muscles intact.  HEENT: Head atraumatic, normocephalic. Oropharynx and nasopharynx clear.  NECK:  Supple, no jugular venous distention. No thyroid enlargement, no tenderness.  LUNGS: Normal breath sounds bilaterally, no wheezing, rales,rhonchi or crepitation. No use of accessory muscles of respiration.  CARDIOVASCULAR: S1, S2 normal. No murmurs, rubs, or gallops.  ABDOMEN: Soft, non-tender, non-distended. Bowel sounds present. No organomegaly or mass.  EXTREMITIES: No pedal edema, cyanosis, or clubbing.  NEUROLOGIC: Cranial nerves II through XII are intact. Muscle strength 5/5 in all extremities. Sensation intact. Gait not checked.  PSYCHIATRIC: The patient is alert and oriented x 3.  SKIN: No obvious rash, lesion, or ulcer.   DATA REVIEW:   CBC  Recent Labs Lab 04/18/16 1423  WBC 8.9  HGB 16.2  HCT 47.8  PLT 239    Chemistries   Recent Labs Lab 04/18/16 1423  NA 136  K 3.7  CL 105  CO2 23  GLUCOSE 132*  BUN 14  CREATININE 1.02  CALCIUM 9.2  AST 21  ALT 17  ALKPHOS 69  BILITOT 0.4    Cardiac Enzymes  Recent Labs Lab 04/19/16 0733  TROPONINI <0.03    Microbiology Results  No results found for this or any previous visit.  RADIOLOGY:  Dg Abdomen Acute W/chest  Result Date: 04/18/2016 CLINICAL DATA:  Shortness of breath, lightheadedness, vomiting, abdominal pain EXAM: DG ABDOMEN ACUTE W/ 1V CHEST COMPARISON:  Chest radiograph 01/29/2016, CT abdomen/ pelvis 08/11/2015 FINDINGS: Cardiomediastinal silhouette is mildly  enlarged. No focal airspace disease or pulmonary edema. No pneumothorax or sizable pleural effusion. There is no free intraperitoneal air. No dilated loops of small bowel are identified. The bowel gas pattern is normal. No abdominal mass or radiopaque calcification is identified. IMPRESSION: Negative abdominal radiographs.  No acute cardiopulmonary disease. Electronically Signed   By: Deatra RobinsonKevin  Herman M.D.   On: 04/18/2016 18:07     Management plans discussed with the patient, family and they are in agreement.  CODE STATUS:     Code Status Orders        Start     Ordered   04/18/16 1944  Full code  Continuous     04/18/16 1943    Code Status History    Date Active Date Inactive Code Status Order ID Comments User Context   04/18/2016  7:43 PM 04/19/2016  8:09 AM Full Code [id number]  Enedina Finner, MD Inpatient   05/31/2015  4:00 AM 06/01/2015 12:34 AM Full Code [id number]  Arnaldo Natal, MD Inpatient      TOTAL TIME TAKING CARE OF THIS PATIENT: 33 minutes.    Hower,  Mardi Mainland.D on 04/19/2016 at 10:29 AM  Between 7am to 6pm - Pager - [phone]  After 6pm go to [email or web address] - Scientist, research (life sciences) [facility]  Office  [phone]  CC: Primary care physician; No primary care provider on file.

## 2016-04-19 NOTE — Progress Notes (Addendum)
Pt. Discharged to home via wc. Discharge instructions and medication regimen reviewed at bedside with patient and sister. Both verbalize understanding of instructions and medication regimen. Prescriptions included with d/c papers. Patient assessment unchanged from this morning. TELE and IV discontinued per policy.

## 2016-04-19 NOTE — Progress Notes (Signed)
*  PRELIMINARY RESULTS* Echocardiogram 2D Echocardiogram has been performed.  Brad [name] 04/19/2016, 8:45 AM

## 2016-10-17 ENCOUNTER — Emergency Department
Admission: EM | Admit: 2016-10-17 | Discharge: 2016-10-17 | Disposition: A | Discharge status: Home / Self Care | Payer: Self-pay | Attending: Emergency Medicine | Admitting: Emergency Medicine

## 2016-10-17 ENCOUNTER — Encounter: Payer: Self-pay | Admitting: Emergency Medicine

## 2016-10-17 ENCOUNTER — Emergency Department: Payer: Self-pay

## 2016-10-17 DIAGNOSIS — K5732 Diverticulitis of large intestine without perforation or abscess without bleeding: Secondary | ICD-10-CM | POA: Insufficient documentation

## 2016-10-17 DIAGNOSIS — R1032 Left lower quadrant pain: Secondary | ICD-10-CM

## 2016-10-17 DIAGNOSIS — Z79899 Other long term (current) drug therapy: Secondary | ICD-10-CM | POA: Insufficient documentation

## 2016-10-17 DIAGNOSIS — I10 Essential (primary) hypertension: Secondary | ICD-10-CM | POA: Insufficient documentation

## 2016-10-17 DIAGNOSIS — J45909 Unspecified asthma, uncomplicated: Secondary | ICD-10-CM | POA: Insufficient documentation

## 2016-10-17 DIAGNOSIS — F17210 Nicotine dependence, cigarettes, uncomplicated: Secondary | ICD-10-CM | POA: Insufficient documentation

## 2016-10-17 LAB — COMPREHENSIVE METABOLIC PANEL
ALK PHOS: 63 U/L (ref 38–126)
ALT: 14 U/L — AB (ref 17–63)
AST: 23 U/L (ref 15–41)
Albumin: 4.7 g/dL (ref 3.5–5.0)
Anion gap: 10 (ref 5–15)
BUN: 21 mg/dL — ABNORMAL HIGH (ref 6–20)
CALCIUM: 9.7 mg/dL (ref 8.9–10.3)
CO2: 28 mmol/L (ref 22–32)
CREATININE: 1.6 mg/dL — AB (ref 0.61–1.24)
Chloride: 100 mmol/L — ABNORMAL LOW (ref 101–111)
GFR calc non Af Amer: 52 mL/min — ABNORMAL LOW (ref >60)
Glucose, Bld: 138 mg/dL — ABNORMAL HIGH (ref 65–99)
Potassium: 3.9 mmol/L (ref 3.5–5.1)
SODIUM: 138 mmol/L (ref 135–145)
Total Bilirubin: 0.6 mg/dL (ref 0.3–1.2)
Total Protein: 8.7 g/dL — ABNORMAL HIGH (ref 6.5–8.1)

## 2016-10-17 LAB — CBC
HCT: 46.5 % (ref 40.0–52.0)
Hemoglobin: 16.1 g/dL (ref 13.0–18.0)
MCH: 27.6 pg (ref 26.0–34.0)
MCHC: 34.7 g/dL (ref 32.0–36.0)
MCV: 79.4 fL — ABNORMAL LOW (ref 80.0–100.0)
PLATELETS: 263 10*3/uL (ref 150–440)
RBC: 5.85 MIL/uL (ref 4.40–5.90)
RDW: 14.7 % — AB (ref 11.5–14.5)
WBC: 12.9 10*3/uL — ABNORMAL HIGH (ref 3.8–10.6)

## 2016-10-17 LAB — LIPASE, BLOOD: Lipase: 20 U/L (ref 11–51)

## 2016-10-17 MED ORDER — CIPROFLOXACIN HCL 500 MG PO TABS
500.0000 mg | ORAL_TABLET | Freq: Two times a day (BID) | ORAL | 0 refills | Status: AC
Start: 2016-10-17 — End: 2016-10-24

## 2016-10-17 MED ORDER — POLYETHYLENE GLYCOL 3350 17 G PO PACK: 17.0000 g | PACK | Freq: Every day | ORAL | 0 refills | Status: DC

## 2016-10-17 MED ORDER — ONDANSETRON HCL 4 MG/2ML IJ SOLN
4.0000 mg | Freq: Once | INTRAMUSCULAR | Status: AC | PRN
  Administered 2016-10-17: 4 mg via INTRAVENOUS

## 2016-10-17 MED ORDER — IOPAMIDOL (ISOVUE-300) INJECTION 61%
30.0000 mL | Freq: Once | INTRAVENOUS | Status: AC | PRN
  Administered 2016-10-17: 30 mL via ORAL

## 2016-10-17 MED ORDER — CIPROFLOXACIN HCL 500 MG PO TABS
250.0000 mg | ORAL_TABLET | Freq: Once | ORAL | Status: AC
  Administered 2016-10-17: 250 mg via ORAL
  Filled 2016-10-17: qty 2

## 2016-10-17 MED ORDER — HYDROCODONE-ACETAMINOPHEN 5-325 MG PO TABS: 1.0000 | ORAL_TABLET | ORAL | 0 refills | Status: DC | PRN

## 2016-10-17 MED ORDER — IOPAMIDOL (ISOVUE-300) INJECTION 61%
100.0000 mL | Freq: Once | INTRAVENOUS | Status: AC | PRN
  Administered 2016-10-17: 100 mL via INTRAVENOUS

## 2016-10-17 MED ORDER — PROMETHAZINE HCL 12.5 MG PO TABS: 12.5000 mg | ORAL_TABLET | Freq: Four times a day (QID) | ORAL | 0 refills | Status: DC | PRN

## 2016-10-17 MED ORDER — ONDANSETRON HCL 4 MG/2ML IJ SOLN
INTRAMUSCULAR | Status: AC
  Administered 2016-10-17: 4 mg via INTRAVENOUS
  Filled 2016-10-17: qty 2

## 2016-10-17 MED ORDER — FENTANYL CITRATE (PF) 100 MCG/2ML IJ SOLN
50.0000 ug | INTRAMUSCULAR | Status: DC | PRN
  Administered 2016-10-17: 50 ug via INTRAVENOUS
  Filled 2016-10-17: qty 2

## 2016-10-17 MED ORDER — METRONIDAZOLE 500 MG PO TABS
500.0000 mg | ORAL_TABLET | Freq: Three times a day (TID) | ORAL | Status: DC
  Administered 2016-10-17: 500 mg via ORAL
  Filled 2016-10-17: qty 1

## 2016-10-17 MED ORDER — METRONIDAZOLE 500 MG PO TABS: 500.0000 mg | ORAL_TABLET | Freq: Three times a day (TID) | ORAL | 0 refills | Status: AC

## 2016-10-17 MED ORDER — SODIUM CHLORIDE 0.9 % IV BOLUS (SEPSIS)
1000.0000 mL | Freq: Once | INTRAVENOUS | Status: AC
  Administered 2016-10-17: 1000 mL via INTRAVENOUS

## 2016-10-17 NOTE — ED Notes (Signed)
Patient transported to CT 

## 2016-10-17 NOTE — ED Triage Notes (Addendum)
Pt presents to ED with nephew with reports of abdominal pain that patient relates to diverticulitis. Pt unable to sit still in triage, is tachypneic as well. Pt states he has a hx of diverticulitis and pain is the same. Pt writhing around in pain in triage  Grunting and moaning in pain. States he has been vomiting since 2am and is not having any diarrhea. Abdominal pain radiate to lower back.

## 2016-10-17 NOTE — ED Provider Notes (Signed)
Patient received in sign-out from Dr. Scotty CourtStafford.  Workup and evaluation pending CT imaging for acute abdominal pain.  CT imaging with evidence of colonic wall thickening concerning for diverticulitis. Discussed concern for possible malignancy and need for colonoscopy with patient. Patient tolerating oral hydration and was able to tolerate his oral antibiotics. States his pain is resolved. Otherwise hemodynamically stable and in no acute distress. Do feel patient is appropriate for further workup as an outpatient. Patient was able to tolerate PO and was able to ambulate with a steady gait.  Have discussed with the patient and available family all diagnostics and treatments performed thus far and all questions were answered to the best of my ability. The patient demonstrates understanding and agreement with plan.       Willy EddyPatrick Guinn Delarosa, MD 10/17/16 [phone]

## 2016-10-17 NOTE — ED Provider Notes (Signed)
[facility] Emergency Department Provider Note  ____________________________________________  Time seen: Approximately 2:17 PM  I have reviewed the triage vital signs and the nursing notes.   HISTORY  Chief Complaint Abdominal Pain and Diverticulitis    HPI Brad [name] is a 41 y.o. male who complains of left lower quadrant abdominal pain for the past 2 days. No diarrhea or bloody stool. No fever but he does have chills. The pain radiates to the back. He has nausea but no vomiting. He is eating chips in the treatment room right now. Pain is constant waxing and waning moderate intensity. Feels like prior episodes of diverticulitis.     Past Medical History:  Diagnosis Date  . Asthma   . Diverticulitis   . Hypertension      Patient Active Problem List   Diagnosis Date Noted  . 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 Intractable nausea and vomiting 05/31/2015     Past Surgical History:  Procedure Laterality Date  . ANKLE SURGERY       Prior to Admission medications   Medication Sig Start Date End Date Taking? Authorizing Provider  lisinopril (PRINIVIL,ZESTRIL) 10 MG tablet Take 1 tablet (10 mg total) by mouth daily. Patient not taking: Reported on 10/17/2016 04/19/16   Wyatt Haste, MD  metoprolol tartrate (LOPRESSOR) 25 MG tablet Take 1 tablet (25 mg total) by mouth 2 (two) times daily. Patient not taking: Reported on 10/17/2016 01/29/16   Rockne Menghini, MD  ondansetron (ZOFRAN) 4 MG tablet Take 1 tablet (4 mg total) by mouth daily as needed for nausea or vomiting. 05/30/15   Darien Ramus, MD  oxyCODONE-acetaminophen (PERCOCET) 5-325 MG tablet Take 2 tablets by mouth every 6 (six) hours as needed for moderate pain or severe pain. Patient not taking: Reported on 05/30/2015 05/23/15   Emily Filbert, MD  promethazine (PHENERGAN) 25 MG tablet Take 1 tablet (25 mg total) by mouth every 6 (six) hours as needed for nausea or  vomiting. Patient not taking: Reported on 10/17/2016 05/30/15   Darien Ramus, MD     Allergies Patient has no known allergies.   History reviewed. No pertinent family history.  Social History Social History  Substance Use Topics  . Smoking status: Current Every Day Smoker    Packs/day: 0.50    Types: Cigarettes  . Smokeless tobacco: Never Used  . Alcohol use Yes     Comment: occas    Review of Systems  Constitutional:   No fever positive chills.  ENT:   No sore throat. No rhinorrhea. Cardiovascular:   No chest pain. Respiratory:   No dyspnea or cough. Gastrointestinal:  Positive left lower quadrant abdominal pain without, vomiting and diarrhea.  Genitourinary:   Negative for dysuria or difficulty urinating. Musculoskeletal:   Negative for focal pain or swelling Neurological:   Negative for headaches 10-point ROS otherwise negative.  ____________________________________________   PHYSICAL EXAM:  VITAL SIGNS: ED Triage Vitals  Enc Vitals Group     BP 10/17/16 1151 (!) 119/118     Pulse Rate 10/17/16 1151 72     Resp 10/17/16 1151 (!) 32     Temp 10/17/16 1151 98.3 F (36.8 C)     Temp Source 10/17/16 1151 Oral     SpO2 10/17/16 1151 100 %     Weight 10/17/16 1152 245 lb (111.1 kg)     Height 10/17/16 1152 5\' 6"  (1.676 m)     Head Circumference --  Peak Flow --      Pain Score 10/17/16 1152 10     Pain Loc --      Pain Edu? --      Excl. in GC? --     Vital signs reviewed, nursing assessments reviewed.   Constitutional:   Alert and oriented. Well appearing and in no distress. Eyes:   No scleral icterus. No conjunctival pallor. PERRL. EOMI.  No nystagmus. ENT   Head:   Normocephalic and atraumatic.   Nose:   No congestion/rhinnorhea. No septal hematoma   Mouth/Throat:   MMM, no pharyngeal erythema. No peritonsillar mass.    Neck:   No stridor. No SubQ emphysema. No meningismus. Hematological/Lymphatic/Immunilogical:   No cervical  lymphadenopathy. Cardiovascular:   RRR. Symmetric bilateral radial and DP pulses.  No murmurs.  Respiratory:   Normal respiratory effort without tachypnea nor retractions. Breath sounds are clear and equal bilaterally. No wheezes/rales/rhonchi. Gastrointestinal:   Soft With left lower quadrant abdominal tenderness. Non distended. There is no CVA tenderness.  No rebound, rigidity, or guarding. Genitourinary:   deferred Musculoskeletal:   Normal range of motion in all extremities. No joint effusions.  No lower extremity tenderness.  No edema. Neurologic:   Normal speech and language.  CN 2-10 normal. Motor grossly intact. No gross focal neurologic deficits are appreciated.  Skin:    Skin is warm, dry and intact. No rash noted.  No petechiae, purpura, or bullae.  ____________________________________________    LABS (pertinent positives/negatives) (all labs ordered are listed, but only abnormal results are displayed) Labs Reviewed  COMPREHENSIVE METABOLIC PANEL - Abnormal; Notable for the following:       Result Value   Chloride 100 (*)    Glucose, Bld 138 (*)    BUN 21 (*)    Creatinine, Ser 1.60 (*)    Total Protein 8.7 (*)    ALT 14 (*)    GFR calc non Af Amer 52 (*)    All other components within normal limits  CBC - Abnormal; Notable for the following:    WBC 12.9 (*)    MCV 79.4 (*)    RDW 14.7 (*)    All other components within normal limits  LIPASE, BLOOD  URINALYSIS, COMPLETE (UACMP) WITH MICROSCOPIC   ____________________________________________   EKG    ____________________________________________    RADIOLOGY CT abdomen and pelvis pending No results found.  ____________________________________________   PROCEDURES Procedures  ____________________________________________   INITIAL IMPRESSION / ASSESSMENT AND PLAN / ED COURSE  Pertinent labs & imaging results that were available during my care of the patient were reviewed by me and considered in my  medical decision making (see chart for details).  Patient well appearing no acute distress. Apparently tolerating oral intake. Due to symptoms and exam, we'll get a CT scan to evaluate for complication of diverticulitis such as perforation or abscess formation. If negative patient should be suitable for outpatient follow-up with symptomatic control, plus or minus antibiotics depending on appearance on CT. Case will be signed out to oncoming physician at 3 PM to follow up on CT for disposition. Low suspicion of AAA dissection perforation obstruction, no evidence of sepsis.         ____________________________________________   FINAL CLINICAL IMPRESSION(S) / ED DIAGNOSES  Final diagnoses:  Left lower quadrant pain  Diverticulitis of large intestine without bleeding, unspecified complication status      New Prescriptions   No medications on file     Portions of this note were  generated with Scientist, clinical (histocompatibility and immunogenetics). Dictation errors may occur despite best attempts at proofreading.    Sharman Cheek, MD 10/17/16 [id number]

## 2016-12-05 ENCOUNTER — Encounter: Payer: Self-pay | Admitting: Emergency Medicine

## 2016-12-05 ENCOUNTER — Emergency Department
Admission: EM | Admit: 2016-12-05 | Discharge: 2016-12-05 | Disposition: A | Discharge status: Home / Self Care | Payer: Self-pay | Attending: Emergency Medicine | Admitting: Emergency Medicine

## 2016-12-05 ENCOUNTER — Emergency Department: Payer: Self-pay

## 2016-12-05 DIAGNOSIS — K5732 Diverticulitis of large intestine without perforation or abscess without bleeding: Secondary | ICD-10-CM | POA: Insufficient documentation

## 2016-12-05 DIAGNOSIS — J45909 Unspecified asthma, uncomplicated: Secondary | ICD-10-CM | POA: Insufficient documentation

## 2016-12-05 DIAGNOSIS — Z79899 Other long term (current) drug therapy: Secondary | ICD-10-CM | POA: Insufficient documentation

## 2016-12-05 DIAGNOSIS — F17210 Nicotine dependence, cigarettes, uncomplicated: Secondary | ICD-10-CM | POA: Insufficient documentation

## 2016-12-05 DIAGNOSIS — I10 Essential (primary) hypertension: Secondary | ICD-10-CM | POA: Insufficient documentation

## 2016-12-05 LAB — CBC
HEMATOCRIT: 47.3 % (ref 40.0–52.0)
HEMOGLOBIN: 15.5 g/dL (ref 13.0–18.0)
MCH: 26.5 pg (ref 26.0–34.0)
MCHC: 32.8 g/dL (ref 32.0–36.0)
MCV: 80.7 fL (ref 80.0–100.0)
Platelets: 247 10*3/uL (ref 150–440)
RBC: 5.86 MIL/uL (ref 4.40–5.90)
RDW: 15.7 % — ABNORMAL HIGH (ref 11.5–14.5)
WBC: 14.2 10*3/uL — AB (ref 3.8–10.6)

## 2016-12-05 LAB — COMPREHENSIVE METABOLIC PANEL
ALBUMIN: 4.4 g/dL (ref 3.5–5.0)
ALT: 18 U/L (ref 17–63)
ANION GAP: 8 (ref 5–15)
AST: 23 U/L (ref 15–41)
Alkaline Phosphatase: 54 U/L (ref 38–126)
BUN: 18 mg/dL (ref 6–20)
CO2: 27 mmol/L (ref 22–32)
Calcium: 9.5 mg/dL (ref 8.9–10.3)
Chloride: 104 mmol/L (ref 101–111)
Creatinine, Ser: 1.47 mg/dL — ABNORMAL HIGH (ref 0.61–1.24)
GFR calc non Af Amer: 58 mL/min — ABNORMAL LOW (ref >60)
GLUCOSE: 117 mg/dL — AB (ref 65–99)
POTASSIUM: 4.1 mmol/L (ref 3.5–5.1)
Sodium: 139 mmol/L (ref 135–145)
TOTAL PROTEIN: 8 g/dL (ref 6.5–8.1)
Total Bilirubin: 1.1 mg/dL (ref 0.3–1.2)

## 2016-12-05 LAB — URINALYSIS, COMPLETE (UACMP) WITH MICROSCOPIC
BACTERIA UA: NONE SEEN
BILIRUBIN URINE: NEGATIVE
GLUCOSE, UA: 50 mg/dL — AB
HGB URINE DIPSTICK: NEGATIVE
Ketones, ur: NEGATIVE mg/dL
NITRITE: NEGATIVE
PROTEIN: 100 mg/dL — AB
Specific Gravity, Urine: 1.025 (ref 1.005–1.030)
Squamous Epithelial / LPF: NONE SEEN
pH: 6 (ref 5.0–8.0)

## 2016-12-05 LAB — LIPASE, BLOOD: Lipase: 16 U/L (ref 11–51)

## 2016-12-05 MED ORDER — OXYCODONE-ACETAMINOPHEN 5-325 MG PO TABS
1.0000 | ORAL_TABLET | ORAL | Status: DC | PRN
Start: 2016-12-05 — End: 2016-12-06
  Administered 2016-12-05: 1 via ORAL

## 2016-12-05 MED ORDER — CIPROFLOXACIN HCL 500 MG PO TABS
500.0000 mg | ORAL_TABLET | Freq: Once | ORAL | Status: AC
  Administered 2016-12-05: 500 mg via ORAL
  Filled 2016-12-05: qty 1

## 2016-12-05 MED ORDER — CIPROFLOXACIN HCL 500 MG PO TABS: 500.0000 mg | ORAL_TABLET | Freq: Two times a day (BID) | ORAL | 0 refills | Status: AC

## 2016-12-05 MED ORDER — OXYCODONE-ACETAMINOPHEN 5-325 MG PO TABS
ORAL_TABLET | ORAL | Status: AC
  Filled 2016-12-05: qty 1

## 2016-12-05 MED ORDER — SODIUM CHLORIDE 0.9 % IV BOLUS (SEPSIS)
1000.0000 mL | Freq: Once | INTRAVENOUS | Status: AC
  Administered 2016-12-05: 1000 mL via INTRAVENOUS

## 2016-12-05 MED ORDER — ONDANSETRON HCL 4 MG/2ML IJ SOLN
4.0000 mg | Freq: Once | INTRAMUSCULAR | Status: AC
  Administered 2016-12-05: 4 mg via INTRAVENOUS
  Filled 2016-12-05: qty 2

## 2016-12-05 MED ORDER — IOPAMIDOL (ISOVUE-300) INJECTION 61%
100.0000 mL | Freq: Once | INTRAVENOUS | Status: AC | PRN
Start: 2016-12-05 — End: 2016-12-05
  Administered 2016-12-05: 100 mL via INTRAVENOUS
  Filled 2016-12-05: qty 100

## 2016-12-05 MED ORDER — ONDANSETRON 4 MG PO TBDP: 4.0000 mg | ORAL_TABLET | Freq: Three times a day (TID) | ORAL | 0 refills | Status: DC | PRN

## 2016-12-05 MED ORDER — METRONIDAZOLE 500 MG PO TABS: 500.0000 mg | ORAL_TABLET | Freq: Two times a day (BID) | ORAL | 0 refills | Status: DC

## 2016-12-05 MED ORDER — IOPAMIDOL (ISOVUE-300) INJECTION 61%
30.0000 mL | Freq: Once | INTRAVENOUS | Status: AC | PRN
  Administered 2016-12-05: 30 mL via ORAL
  Filled 2016-12-05: qty 30

## 2016-12-05 MED ORDER — OXYCODONE-ACETAMINOPHEN 5-325 MG PO TABS: 1.0000 | ORAL_TABLET | ORAL | 0 refills | Status: AC | PRN

## 2016-12-05 MED ORDER — METRONIDAZOLE 500 MG PO TABS
500.0000 mg | ORAL_TABLET | Freq: Once | ORAL | Status: AC
  Administered 2016-12-05: 500 mg via ORAL
  Filled 2016-12-05: qty 1

## 2016-12-05 MED ORDER — HYDROMORPHONE HCL 1 MG/ML IJ SOLN
1.0000 mg | Freq: Once | INTRAMUSCULAR | Status: AC
  Administered 2016-12-05: 1 mg via INTRAVENOUS
  Filled 2016-12-05: qty 1

## 2016-12-05 NOTE — ED Provider Notes (Signed)
[facility] Emergency Department Provider Note  ____________________________________________  Time seen: Approximately 7:53 PM  I have reviewed the triage vital signs and the nursing notes.   HISTORY  Chief Complaint Abdominal Pain    HPI JARRY MANON is a 41 y.o. male with a history of diverticulitis, HTN, presenting with left lower quadrant abdominal pain and nausea and vomiting. The patient reports that over the past day, he has had a progressively worsening left lower quadrant pain and then today began to have multiple episodes of nausea and vomiting. His last bowel movement was this morning and it was normal, no constipation or diarrhea. The patient has not had any fever, chills, or urinary symptoms.   Past Medical History:  Diagnosis Date  . Asthma   . Diverticulitis   . Hypertension     Patient Active Problem List   Diagnosis Date Noted  . 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 Intractable nausea and vomiting 05/31/2015    Past Surgical History:  Procedure Laterality Date  . ANKLE SURGERY      Current Outpatient Rx  . Order #: [id number] Class: Print  . Order #: [id number] Class: Print  . Order #: [id number] Class: Print  . Order #: [id number] Class: Print  . Order #: [id number] Class: Print  . Order #: [id number] Class: Print  . Order #: [id number] Class: Print  . Order #: [id number] Class: Print  . Order #: [id number] Class: Print  . Order #: [id number] Class: Print  . Order #: [id number] Class: Print    Allergies Patient has no known allergies.  No family history on file.  Social History Social History  Substance Use Topics  . Smoking status: Current Every Day Smoker    Packs/day: 0.50    Types: Cigarettes  . Smokeless tobacco: Never Used  . Alcohol use Yes     Comment: occas    Review of Systems Constitutional: No fever/chills. Eyes: No visual changes. ENT: No sore throat. No congestion or rhinorrhea. Cardiovascular: Denies  chest pain. Denies palpitations. Respiratory: Denies shortness of breath.  No cough. Gastrointestinal: Positive left lower quadrant abdominal pain.  Positive nausea, positive vomiting.  No diarrhea.  No constipation. Genitourinary: Negative for dysuria. Musculoskeletal: Negative for back pain. Denies flank pain. Skin: Negative for rash. Neurological: Negative for headaches. No focal numbness, tingling or weakness.   10-point ROS otherwise negative.  ____________________________________________   PHYSICAL EXAM:  VITAL SIGNS: ED Triage Vitals  Enc Vitals Group     BP 12/05/16 1649 (!) 185/105     Pulse Rate 12/05/16 1649 82     Resp 12/05/16 1649 16     Temp 12/05/16 1649 98.7 F (37.1 C)     Temp Source 12/05/16 1649 Oral     SpO2 12/05/16 1649 96 %     Weight 12/05/16 1650 256 lb (116.1 kg)     Height 12/05/16 1650  (1.676 m)     Head Circumference --      Peak Flow --      Pain Score 12/05/16 1651 10     Pain Loc --      Pain Edu? --      Excl. in GC? --     Constitutional: Alert and oriented. Well appearing and in no acute distress. Answers questions appropriately. Eyes: Conjunctivae are normal.  EOMI. No scleral icterus. Head: Atraumatic. Nose: No congestion/rhinnorhea. Mouth/Throat: Mucous membranes are moist.  Neck: No stridor.  Supple.   Cardiovascular: Normal rate, regular rhythm. No murmurs, rubs or gallops.  Respiratory: Normal respiratory  effort.  No accessory muscle use or retractions. Lungs CTAB.  No wheezes, rales or ronchi. Gastrointestinal: Soft, Obese, and nondistended.  Positive tenderness to palpation in the left lower quadrant. No CVA tenderness on the left. No guarding or rebound.  No peritoneal signs. Musculoskeletal: No LE edema. No ttp in the calves or palpable cords.  Negative Homan's sign. Neurologic:  A&Ox3.  Speech is clear.  Face and smile are symmetric.  EOMI.  Moves all extremities well. Skin:  Skin is warm, dry and intact. No rash  noted. Psychiatric: Mood and affect are normal. Speech and behavior are normal.  Normal judgement  ____________________________________________   LABS (all labs ordered are listed, but only abnormal results are displayed)  Labs Reviewed  COMPREHENSIVE METABOLIC PANEL - Abnormal; Notable for the following:       Result Value   Glucose, Bld 117 (*)    Creatinine, Ser 1.47 (*)    GFR calc non Af Amer 58 (*)    All other components within normal limits  CBC - Abnormal; Notable for the following:    WBC 14.2 (*)    RDW 15.7 (*)    All other components within normal limits  URINALYSIS, COMPLETE (UACMP) WITH MICROSCOPIC - Abnormal; Notable for the following:    Color, Urine YELLOW (*)    APPearance HAZY (*)    Glucose, UA 50 (*)    Protein, ur 100 (*)    Leukocytes, UA TRACE (*)    All other components within normal limits  LIPASE, BLOOD   ____________________________________________  EKG  Not indicated ____________________________________________  RADIOLOGY  Ct Abdomen Pelvis W Contrast  Result Date: 12/05/2016 CLINICAL DATA:  LEFT flank and abdominal pain beginning tonight with multiple episodes of vomiting. History of diverticulitis and hypertension. EXAM: CT ABDOMEN AND PELVIS WITH CONTRAST TECHNIQUE: Multidetector CT imaging of the abdomen and pelvis was performed using the standard protocol following bolus administration of intravenous contrast. CONTRAST:  100 cc Isovue-300 COMPARISON:  CT abdomen and pelvis October 17, 2016 FINDINGS: LOWER CHEST: Minimal tree-in-bud infiltrates LEFT lower lobe. No pleural effusions. The heart is mildly enlarged. No pericardial effusions. Tiny fat containing RIGHT diaphragmatic hernia. HEPATOBILIARY: Liver is normal. Layering density in the gallbladder most compatible with gallbladder sludge. PANCREAS: Normal. SPLEEN: Normal. ADRENALS/URINARY TRACT: Kidneys are orthotopic, demonstrating symmetric enhancement. No nephrolithiasis, hydronephrosis  or solid renal masses. Multiple cysts in the LEFT kidney measure 212 mm. The unopacified ureters are normal in course and caliber. Delayed imaging through the kidneys demonstrates symmetric prompt contrast excretion within the proximal urinary collecting system. Urinary bladder is partially distended and unremarkable. Normal adrenal glands. STOMACH/BOWEL: Mild colonic diverticulosis with short segment of descending colonic wall thickening and pericolonic inflammation, improved decreased from prior examination. The stomach, small bowel are normal in course and caliber without inflammatory changes. Normal appendix. VASCULAR/LYMPHATIC: Aortoiliac vessels are normal in course and caliber, mild calcific atherosclerosis. No lymphadenopathy by CT size criteria. REPRODUCTIVE: Prostate mildly invades the base the urinary bladder. OTHER: No intraperitoneal free fluid or free air. MUSCULOSKELETAL: Nonacute. Mild lower lumbar facet arthropathy. Small fat containing umbilical hernia. IMPRESSION: Acute recurrent descending colonic diverticulitis without complication. Reiterated recommendation for colonoscopy after resolution of acute symptoms. LEFT lower lobe tree-in-bud infiltrates may be infectious or inflammatory. Electronically Signed   By: Awilda Metro M.D.   On: 12/05/2016 21:16    ____________________________________________   PROCEDURES  Procedure(s) performed: None  Procedures  Critical Care performed: No ____________________________________________   INITIAL IMPRESSION / ASSESSMENT AND PLAN /  ED COURSE  Pertinent labs & imaging results that were available during my care of the patient were reviewed by me and considered in my medical decision making (see chart for details).  41 y.o. male with a history of diverticulitis presenting with left lower quadrant pain, nausea and vomiting. This is possibly repeat diverticulitis, but also consider renal stone, or a viral or foodborne GI illness. We'll  get a CT scan for further evaluation. Plan reevaluation for final disposition.  ----------------------------------------- 10:53 PM on 12/05/2016 -----------------------------------------  The patient is doing much better at this time. His pain is resolved and his nausea is gone. He does have diverticulitis and I will give him his first dose of antibiotics here and discharge him home with antibiotics and pain control. He will follow-up with his primary care physician can also schedule an appointment with the gastroenterologist.  ____________________________________________  FINAL CLINICAL IMPRESSION(S) / ED DIAGNOSES  Final diagnoses:  Diverticulitis of large intestine without perforation or abscess without bleeding         NEW MEDICATIONS STARTED DURING THIS VISIT:  New Prescriptions   CIPROFLOXACIN (CIPRO) 500 MG TABLET    Take 1 tablet (500 mg total) by mouth 2 (two) times daily.   METRONIDAZOLE (FLAGYL) 500 MG TABLET    Take 1 tablet (500 mg total) by mouth 2 (two) times daily.   ONDANSETRON (ZOFRAN ODT) 4 MG DISINTEGRATING TABLET    Take 1 tablet (4 mg total) by mouth every 8 (eight) hours as needed for nausea or vomiting.   OXYCODONE-ACETAMINOPHEN (ROXICET) 5-325 MG TABLET    Take 1-2 tablets by mouth every 4 (four) hours as needed.      Rockne Menghini, MD 12/05/16 2253

## 2016-12-05 NOTE — Discharge Instructions (Signed)
Please take the entire course of antibiotics, even if you're feeling better. You may take Tylenol for mild to moderate pain, and Percocet for severe pain. Do not drive within 8 hours of taking Percocet. Avoid Motrin as this can worsen your abnormal kidney function.  Please make an appointment with gastroenterology specialist to discuss whether you need to have a colonoscopy.  Drink plenty of fluid to stay well-hydrated.  Return to the emergency department if you develop severe pain, inability to keep down fluids, lightheadedness or fainting, fever, or any other symptoms concerning to you.

## 2016-12-05 NOTE — ED Triage Notes (Signed)
Pt reports left sided flank/abdominal pain that started last night, reports 10+ emesis episodes. Denies urinary frequency or pain.

## 2017-10-21 ENCOUNTER — Other Ambulatory Visit: Payer: Self-pay

## 2017-10-21 ENCOUNTER — Emergency Department
Admission: EM | Admit: 2017-10-21 | Discharge: 2017-10-21 | Disposition: A | Discharge status: Home / Self Care | Payer: No Typology Code available for payment source | Attending: Student in an Organized Health Care Education/Training Program | Admitting: Student in an Organized Health Care Education/Training Program

## 2017-10-21 ENCOUNTER — Encounter: Payer: Self-pay | Admitting: Physician Assistant

## 2017-10-21 ENCOUNTER — Emergency Department: Payer: No Typology Code available for payment source

## 2017-10-21 DIAGNOSIS — Y939 Activity, unspecified: Secondary | ICD-10-CM | POA: Insufficient documentation

## 2017-10-21 DIAGNOSIS — I10 Essential (primary) hypertension: Secondary | ICD-10-CM | POA: Insufficient documentation

## 2017-10-21 DIAGNOSIS — F17210 Nicotine dependence, cigarettes, uncomplicated: Secondary | ICD-10-CM | POA: Insufficient documentation

## 2017-10-21 DIAGNOSIS — S161XXA Strain of muscle, fascia and tendon at neck level, initial encounter: Secondary | ICD-10-CM | POA: Diagnosis not present

## 2017-10-21 DIAGNOSIS — J45909 Unspecified asthma, uncomplicated: Secondary | ICD-10-CM | POA: Diagnosis not present

## 2017-10-21 DIAGNOSIS — Z79899 Other long term (current) drug therapy: Secondary | ICD-10-CM | POA: Diagnosis not present

## 2017-10-21 DIAGNOSIS — Y929 Unspecified place or not applicable: Secondary | ICD-10-CM | POA: Insufficient documentation

## 2017-10-21 DIAGNOSIS — S39012A Strain of muscle, fascia and tendon of lower back, initial encounter: Secondary | ICD-10-CM

## 2017-10-21 DIAGNOSIS — Y998 Other external cause status: Secondary | ICD-10-CM | POA: Insufficient documentation

## 2017-10-21 DIAGNOSIS — S3992XA Unspecified injury of lower back, initial encounter: Secondary | ICD-10-CM | POA: Diagnosis present

## 2017-10-21 MED ORDER — MELOXICAM 15 MG PO TABS: 15.0000 mg | ORAL_TABLET | Freq: Every day | ORAL | 2 refills | Status: DC

## 2017-10-21 MED ORDER — BACLOFEN 10 MG PO TABS: 10.0000 mg | ORAL_TABLET | Freq: Every day | ORAL | 1 refills | Status: DC
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
## 2017-10-21 NOTE — Discharge Instructions (Signed)
Follow-up with your regular doctor if not better in 5-7 days.  Or see [facility].  He would need to call make an appointment.  Apply ice to any the areas that hurt.  Take medication as prescribed.  If you are worsening please return the emergency department

## 2018-10-06 ENCOUNTER — Encounter: Payer: Self-pay | Admitting: *Deleted

## 2018-10-06 ENCOUNTER — Inpatient Hospital Stay
Admission: EM | Admit: 2018-10-06 | Discharge: 2018-10-12 | DRG: 392 | Discharge status: Against Medical Advice | Payer: Self-pay | Attending: Internal Medicine | Admitting: Internal Medicine

## 2018-10-06 ENCOUNTER — Emergency Department: Payer: Self-pay

## 2018-10-06 DIAGNOSIS — K5732 Diverticulitis of large intestine without perforation or abscess without bleeding: Principal | ICD-10-CM | POA: Diagnosis present

## 2018-10-06 DIAGNOSIS — K5792 Diverticulitis of intestine, part unspecified, without perforation or abscess without bleeding: Secondary | ICD-10-CM

## 2018-10-06 DIAGNOSIS — Z79899 Other long term (current) drug therapy: Secondary | ICD-10-CM

## 2018-10-06 DIAGNOSIS — N182 Chronic kidney disease, stage 2 (mild): Secondary | ICD-10-CM | POA: Diagnosis present

## 2018-10-06 DIAGNOSIS — J45909 Unspecified asthma, uncomplicated: Secondary | ICD-10-CM | POA: Diagnosis present

## 2018-10-06 DIAGNOSIS — I129 Hypertensive chronic kidney disease with stage 1 through stage 4 chronic kidney disease, or unspecified chronic kidney disease: Secondary | ICD-10-CM | POA: Diagnosis present

## 2018-10-06 DIAGNOSIS — Z6841 Body Mass Index (BMI) 40.0 and over, adult: Secondary | ICD-10-CM

## 2018-10-06 DIAGNOSIS — Z8249 Family history of ischemic heart disease and other diseases of the circulatory system: Secondary | ICD-10-CM

## 2018-10-06 DIAGNOSIS — F17210 Nicotine dependence, cigarettes, uncomplicated: Secondary | ICD-10-CM | POA: Diagnosis present

## 2018-10-06 DIAGNOSIS — R109 Unspecified abdominal pain: Secondary | ICD-10-CM

## 2018-10-06 LAB — URINALYSIS, COMPLETE (UACMP) WITH MICROSCOPIC
BILIRUBIN URINE: NEGATIVE
Bacteria, UA: NONE SEEN
Glucose, UA: 150 mg/dL — AB
HGB URINE DIPSTICK: NEGATIVE
Ketones, ur: NEGATIVE mg/dL
LEUKOCYTE UA: NEGATIVE
NITRITE: NEGATIVE
PH: 8 (ref 5.0–8.0)
Protein, ur: 100 mg/dL — AB
Specific Gravity, Urine: 1.013 (ref 1.005–1.030)

## 2018-10-06 LAB — COMPREHENSIVE METABOLIC PANEL
ALBUMIN: 4.4 g/dL (ref 3.5–5.0)
ALK PHOS: 58 U/L (ref 38–126)
ALT: 14 U/L (ref 0–44)
ANION GAP: 8 (ref 5–15)
AST: 24 U/L (ref 15–41)
BUN: 17 mg/dL (ref 6–20)
CALCIUM: 9.6 mg/dL (ref 8.9–10.3)
CHLORIDE: 107 mmol/L (ref 98–111)
CO2: 25 mmol/L (ref 22–32)
CREATININE: 1.37 mg/dL — AB (ref 0.61–1.24)
GFR calc non Af Amer: >60 mL/min (ref >60)
GLUCOSE: 126 mg/dL — AB (ref 70–99)
Potassium: 4.4 mmol/L (ref 3.5–5.1)
SODIUM: 140 mmol/L (ref 135–145)
Total Bilirubin: 0.7 mg/dL (ref 0.3–1.2)
Total Protein: 7.9 g/dL (ref 6.5–8.1)

## 2018-10-06 LAB — CBC
HCT: 48.9 % (ref 39.0–52.0)
Hemoglobin: 15.9 g/dL (ref 13.0–17.0)
MCH: 26.4 pg (ref 26.0–34.0)
MCHC: 32.5 g/dL (ref 30.0–36.0)
MCV: 81.2 fL (ref 80.0–100.0)
PLATELETS: 274 10*3/uL (ref 150–400)
RBC: 6.02 MIL/uL — ABNORMAL HIGH (ref 4.22–5.81)
RDW: 15.5 % (ref 11.5–15.5)
WBC: 8 10*3/uL (ref 4.0–10.5)
nRBC: 0 % (ref 0.0–0.2)

## 2018-10-06 LAB — LIPASE, BLOOD: LIPASE: 43 U/L (ref 11–51)

## 2018-10-06 MED ORDER — ONDANSETRON HCL 4 MG/2ML IJ SOLN
4.0000 mg | Freq: Once | INTRAMUSCULAR | Status: AC
  Administered 2018-10-06: 4 mg via INTRAVENOUS
  Filled 2018-10-06: qty 2

## 2018-10-06 MED ORDER — HEPARIN SODIUM (PORCINE) 5000 UNIT/ML IJ SOLN
5000.0000 [IU] | Freq: Three times a day (TID) | INTRAMUSCULAR | Status: DC
  Administered 2018-10-07 – 2018-10-12 (×14): 5000 [IU] via SUBCUTANEOUS
  Filled 2018-10-06 (×15): qty 1

## 2018-10-06 MED ORDER — HYDROMORPHONE HCL 1 MG/ML IJ SOLN
1.0000 mg | Freq: Once | INTRAMUSCULAR | Status: AC
  Administered 2018-10-06: 1 mg via INTRAVENOUS
  Filled 2018-10-06: qty 1

## 2018-10-06 MED ORDER — SODIUM CHLORIDE 0.9 % IV SOLN
INTRAVENOUS | Status: DC
  Administered 2018-10-06 – 2018-10-08 (×3): via INTRAVENOUS

## 2018-10-06 MED ORDER — PROMETHAZINE HCL 25 MG/ML IJ SOLN
12.5000 mg | Freq: Once | INTRAMUSCULAR | Status: AC
  Administered 2018-10-06: 12.5 mg via INTRAVENOUS
  Filled 2018-10-06: qty 1

## 2018-10-06 MED ORDER — SODIUM CHLORIDE 0.9% FLUSH
3.0000 mL | Freq: Once | INTRAVENOUS | Status: AC
  Administered 2018-10-06: 3 mL via INTRAVENOUS

## 2018-10-06 MED ORDER — IOPAMIDOL (ISOVUE-300) INJECTION 61%
30.0000 mL | Freq: Once | INTRAVENOUS | Status: AC | PRN
  Administered 2018-10-06: 30 mL via ORAL
  Filled 2018-10-06: qty 30

## 2018-10-06 MED ORDER — FENTANYL CITRATE (PF) 100 MCG/2ML IJ SOLN
50.0000 ug | Freq: Once | INTRAMUSCULAR | Status: AC
  Administered 2018-10-06: 50 ug via INTRAVENOUS
  Filled 2018-10-06: qty 2

## 2018-10-06 MED ORDER — MORPHINE SULFATE (PF) 2 MG/ML IV SOLN
2.0000 mg | INTRAVENOUS | Status: DC | PRN
  Administered 2018-10-08: 2 mg via INTRAVENOUS
  Filled 2018-10-06: qty 1

## 2018-10-06 MED ORDER — CIPROFLOXACIN IN D5W 400 MG/200ML IV SOLN
400.0000 mg | Freq: Two times a day (BID) | INTRAVENOUS | Status: DC
  Administered 2018-10-06 – 2018-10-08 (×4): 400 mg via INTRAVENOUS
  Filled 2018-10-06 (×6): qty 200

## 2018-10-06 MED ORDER — SODIUM CHLORIDE 0.9 % IV SOLN
3.0000 g | Freq: Once | INTRAVENOUS | Status: AC
  Administered 2018-10-06: 3 g via INTRAVENOUS
  Filled 2018-10-06: qty 3

## 2018-10-06 MED ORDER — ONDANSETRON HCL 4 MG/2ML IJ SOLN
4.0000 mg | Freq: Four times a day (QID) | INTRAMUSCULAR | Status: DC | PRN
  Administered 2018-10-08: 4 mg via INTRAVENOUS
  Filled 2018-10-06: qty 2

## 2018-10-06 MED ORDER — DOCUSATE SODIUM 100 MG PO CAPS: 100.0000 mg | ORAL_CAPSULE | Freq: Two times a day (BID) | ORAL | Status: DC | PRN

## 2018-10-06 MED ORDER — IOHEXOL 300 MG/ML  SOLN
100.0000 mL | Freq: Once | INTRAMUSCULAR | Status: AC | PRN
  Administered 2018-10-06: 100 mL via INTRAVENOUS
  Filled 2018-10-06: qty 100

## 2018-10-06 MED ORDER — ONDANSETRON 4 MG PO TBDP
4.0000 mg | ORAL_TABLET | Freq: Once | ORAL | Status: AC | PRN
  Administered 2018-10-06: 4 mg via ORAL
  Filled 2018-10-06: qty 1

## 2018-10-06 MED ORDER — METRONIDAZOLE IN NACL 5-0.79 MG/ML-% IV SOLN
500.0000 mg | Freq: Three times a day (TID) | INTRAVENOUS | Status: DC
  Administered 2018-10-07 – 2018-10-08 (×6): 500 mg via INTRAVENOUS
  Filled 2018-10-06 (×10): qty 100

## 2018-10-06 MED ORDER — HYDROCODONE-ACETAMINOPHEN 5-325 MG PO TABS
1.0000 | ORAL_TABLET | ORAL | Status: DC | PRN
  Administered 2018-10-07 – 2018-10-12 (×4): 1 via ORAL
  Filled 2018-10-06 (×4): qty 1

## 2018-10-06 MED ORDER — SODIUM CHLORIDE 0.9 % IV BOLUS
1000.0000 mL | Freq: Once | INTRAVENOUS | Status: AC
  Administered 2018-10-06: 1000 mL via INTRAVENOUS

## 2018-10-06 NOTE — ED Notes (Signed)
Patient transported to CT 

## 2018-10-06 NOTE — ED Notes (Signed)
Pt had an episode of nausea with dry heaves and broke out in a sweat.  Cool washcloth given for comfort as well as zofran

## 2018-10-06 NOTE — ED Triage Notes (Signed)
Pt with hx of diverticulitis states that he thinks that he is having diverticulitis.  He has been having abdominal pain which has been getting worse since it began yesterday.  Pt states that he has been having n/v with this.  LBM was today.  No fever or chills

## 2018-10-06 NOTE — ED Notes (Signed)
Hospitalist to bedside at this time 

## 2018-10-06 NOTE — ED Notes (Signed)
ED TO INPATIENT HANDOFF REPORT  Name/Age/Gender Brad [name] 43 y.o. male  Code Status Code Status History    Date Active Date Inactive Code Status Order ID Comments User Context   04/18/2016 1943 04/19/2016 0809 Full Code [id number]  Enedina FinnerPatel, Sona, MD Inpatient   05/31/2015 0400 06/01/2015 0034 Full Code [id number]  Arnaldo Nataliamond, Michael S, MD Inpatient      Home/SNF/Other Home  Chief Complaint vomitting, stomach pain  Level of Care/Admitting Diagnosis ED Disposition    ED Disposition Condition Comment   Admit  [facility] [[id number]]  Level of Care: Med-Surg [16]  Diagnosis: Acute diverticulitis [[id number]][[id number]]  Admitting Physician: Altamese DillingVACHHANI, VAIBHAVKUMAR [[id number]]  Attending Physician: Altamese DillingVACHHANI, VAIBHAVKUMAR [phone][[id number]]  Estimated length of stay: past midnight tomorrow  Certification:: I certify this patient will need inpatient services for at least 2 midnights  PT Class (Do Not Modify): Inpatient [101]  PT Acc Code (Do Not Modify): Private [1]       Medical History Past Medical History:  Diagnosis Date  . Asthma   . Diverticulitis   . Hypertension     Allergies No Known Allergies  IV Location/Drains/Wounds Patient Lines/Drains/Airways Status   Active Line/Drains/Airways    Name:   Placement date:   Placement time:   Site:   Days:   Peripheral IV 10/06/18 Right Hand   10/06/18    1732    Hand   less than 1          Labs/Imaging Results for orders placed or performed during the hospital encounter of 10/06/18 (from the past 48 hour(s))  Lipase, blood     Status: None   Collection Time: 10/06/18  5:27 PM  Result Value Ref Range   Lipase 43 11 - 51 U/L    Comment: Performed at [facility], [address] [place], [place] [address]  Comprehensive metabolic panel     Status: Abnormal   Collection Time: 10/06/18  5:27 PM  Result Value Ref Range   Sodium 140 135 - 145 mmol/L   Potassium 4.4 3.5 - 5.1 mmol/L   Chloride 107 98  - 111 mmol/L   CO2 25 22 - 32 mmol/L   Glucose, Bld 126 (H) 70 - 99 mg/dL   BUN 17 6 - 20 mg/dL   Creatinine, Ser 7.561.37 (H) 0.61 - 1.24 mg/dL   Calcium 9.6 8.9 - 43.310.3 mg/dL   Total Protein 7.9 6.5 - 8.1 g/dL   Albumin 4.4 3.5 - 5.0 g/dL   AST 24 15 - 41 U/L   ALT 14 0 - 44 U/L   Alkaline Phosphatase 58 38 - 126 U/L   Total Bilirubin 0.7 0.3 - 1.2 mg/dL   GFR calc non Af Amer >60 >60 mL/min   GFR calc Af Amer >60 >60 mL/min   Anion gap 8 5 - 15    Comment: Performed at [facility], [address] [place], [place] [address]  CBC     Status: Abnormal   Collection Time: 10/06/18  5:27 PM  Result Value Ref Range   WBC 8.0 4.0 - 10.5 K/uL   RBC 6.02 (H) 4.22 - 5.81 MIL/uL   Hemoglobin 15.9 13.0 - 17.0 g/dL   HCT 84.148.9 66.039.0 - 63.052.0 %   MCV 81.2 80.0 - 100.0 fL   MCH 26.4 26.0 - 34.0 pg   MCHC 32.5 30.0 - 36.0 g/dL   RDW 16.015.5 10.911.5 - 32.315.5 %   Platelets 274 150 - 400 K/uL  nRBC 0.0 0.0 - 0.2 %    Comment: Performed at [facility], [address] [place], [place] [address]  Urinalysis, Complete w Microscopic     Status: Abnormal   Collection Time: 10/06/18  5:27 PM  Result Value Ref Range   Color, Urine YELLOW (A) YELLOW   APPearance CLEAR (A) CLEAR   Specific Gravity, Urine 1.013 1.005 - 1.030   pH 8.0 5.0 - 8.0   Glucose, UA 150 (A) NEGATIVE mg/dL   Hgb urine dipstick NEGATIVE NEGATIVE   Bilirubin Urine NEGATIVE NEGATIVE   Ketones, ur NEGATIVE NEGATIVE mg/dL   Protein, ur 469 (A) NEGATIVE mg/dL   Nitrite NEGATIVE NEGATIVE   Leukocytes,Ua NEGATIVE NEGATIVE   RBC / HPF 0-5 0 - 5 RBC/hpf   WBC, UA 0-5 0 - 5 WBC/hpf   Bacteria, UA NONE SEEN NONE SEEN   Squamous Epithelial / LPF 0-5 0 - 5   Mucus PRESENT     Comment: Performed at [facility], [address], [place], [place] [address]   Ct Abdomen Pelvis W Contrast  Result Date: 10/06/2018 CLINICAL DATA:  Progressing abdominal pain since yesterday. History of diverticulitis. EXAM: CT ABDOMEN AND  PELVIS WITH CONTRAST TECHNIQUE: Multidetector CT imaging of the abdomen and pelvis was performed using the standard protocol following bolus administration of intravenous contrast. CONTRAST:  OMNIPAQUE IOHEXOL 300 MG/ML  SOLN COMPARISON:  12/05/2016 FINDINGS: Lower chest: Mild cardiomegaly without pericardial effusion. Bibasilar dependent atelectasis. No effusion or pneumothorax. No pulmonary consolidation or dominant mass. Hepatobiliary: Faint vascular blush in the right hepatic lobe which becomes isodense to adjacent liver on repeat imaging likely representing transient contrast filling of this segment of liver versus small hemangioma. No biliary dilatation. Biliary sludge is identified within the gallbladder as before without secondary signs of acute cholecystitis. Pancreas: Normal Spleen: Normal Adrenals/Urinary Tract: Stable left renal cysts, measuring 11 mm in the upper pole and 13 mm in the interpolar aspect. No enhancing renal mass. No uropathy. The urinary bladder is unremarkable for the degree of distention. Normal bilateral adrenal glands. Stomach/Bowel: Acute uncomplicated diverticulitis of the distal descending colon with mild pericolonic inflammatory change but no abscess or bowel obstruction is seen. Stomach, small intestine and remainder of large bowel are unremarkable. Normal appearing appendix is visualized. Vascular/Lymphatic: No significant vascular findings are present. No enlarged abdominal or pelvic lymph nodes. Reproductive: Mild prostatomegaly impressing upon the base of the bladder. Mild fatty induration about prostate can not exclude the possibility of inflammatory change/prostatitis. Other: No intraperitoneal free air nor free fluid. Musculoskeletal: Mild lower lumbar facet arthropathy. Small fat containing umbilical hernia. IMPRESSION: 1. Acute uncomplicated diverticulitis of the distal descending colon. 2. Stable left renal cysts. 3. Faint vascular blush in the right hepatic lobe  which becomes isodense to adjacent liver on repeat imaging likely to represent transient contrast filling versus small hemangioma. 4. Mild prostatomegaly impressing upon the base of the bladder. Mild fatty induration about the prostate may represent stigmata of prostatitis. Electronically Signed   By: Tollie Eth M.D.   On: 10/06/2018 20:44   None  Pending Labs [employer] (From admission, onward)    Start     Ordered   Signed and Held  HIV antibody (Routine Testing)  Once,   R     Signed and Held   Signed and Armed forces training and education officer morning,   R     Signed and Held   Signed and Held  [employer]  morning,   R     Signed and Held   Signed and Held  CBC  (heparin)  Once,   R    Comments:  Baseline for heparin therapy IF NOT ALREADY DRAWN.  Notify MD if PLT < 100 K.    Signed and Held   Signed and Held  Creatinine, serum  (heparin)  Once,   R    Comments:  Baseline for heparin therapy IF NOT ALREADY DRAWN.    Signed and Held          Vitals/Pain Today's Vitals   10/06/18 1722 10/06/18 2205 10/06/18 2252 10/06/18 2254  BP: (!) 151/93 (!) 177/94  (!) 165/88  Pulse: 82 71  77  Resp: 16 17  20   Temp: 98.4 F (36.9 C)   97.9 F (36.6 C)  TempSrc: Oral   Oral  SpO2: 97% 96%  96%  Weight: 117.9 kg     PainSc: 10-Worst pain ever 10-Worst pain ever 0-No pain     Isolation Precautions No active isolations  Medications Medications  morphine 2 MG/ML injection 2 mg (has no administration in time range)  ondansetron (ZOFRAN) injection 4 mg (has no administration in time range)  ciprofloxacin (CIPRO) IVPB 400 mg (400 mg Intravenous New Bag/Given 10/06/18 2300)  metroNIDAZOLE (FLAGYL) IVPB 500 mg (has no administration in time range)  0.9 %  sodium chloride infusion ( Intravenous New Bag/Given 10/06/18 2300)  sodium chloride flush (NS) 0.9 % injection 3 mL (3 mLs Intravenous Given 10/06/18 2302)  ondansetron (ZOFRAN-ODT) disintegrating tablet 4 mg (4 mg Oral Given  10/06/18 1725)  promethazine (PHENERGAN) injection 12.5 mg (12.5 mg Intravenous Given 10/06/18 1843)  fentaNYL (SUBLIMAZE) injection 50 mcg (50 mcg Intravenous Given 10/06/18 1843)  sodium chloride 0.9 % bolus 1,000 mL (0 mLs Intravenous Stopped 10/06/18 1944)  iopamidol (ISOVUE-300) 61 % injection 30 mL (30 mLs Oral Contrast Given 10/06/18 1845)  iohexol (OMNIPAQUE) 300 MG/ML solution 100 mL (100 mLs Intravenous Contrast Given 10/06/18 2014)  ondansetron (ZOFRAN) injection 4 mg (4 mg Intravenous Given 10/06/18 2030)  Ampicillin-Sulbactam (UNASYN) 3 g in sodium chloride 0.9 % 100 mL IVPB (0 g Intravenous Stopped 10/06/18 2302)  HYDROmorphone (DILAUDID) injection 1 mg (1 mg Intravenous Given 10/06/18 2206)    Mobility walks

## 2018-10-06 NOTE — ED Provider Notes (Addendum)
[facility] Emergency Department Provider Note  ____________________________________________   I have reviewed the triage vital signs and the nursing notes. Where available I have reviewed prior notes and, if possible and indicated, outside hospital notes.    HISTORY  Chief Complaint Abdominal Pain    HPI Brad [name] is a 43 y.o. male who presents today complaining of "diverticulitis".  He states he had a several times.  Has never had surgery.  He states that it began yesterday.  He states if he eats the wrong thing he has it.  It is always associated with vomiting.  He has had no diarrhea.  Last bowel movement was normal.  He cannot member exactly when it was.  He denies any fever although he is felt warm.  He has been vomiting which is not unusual for him.  No melena no bright red blood per rectum no hematemesis.  He has left lower quadrant gradual onset discomfort.  No chest pain or shortness of breath.  This is exactly like multiple prior diverticular flares he states.  Other prior treatment no alleviating or aggravating symptoms no other complaints   Past Medical History:  Diagnosis Date  . Asthma   . Diverticulitis   . Hypertension     Patient Active Problem List   Diagnosis Date Noted  . 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 Intractable nausea and vomiting 05/31/2015    Past Surgical History:  Procedure Laterality Date  . ANKLE SURGERY      Prior to Admission medications   Medication Sig Start Date End Date Taking? Authorizing Provider  baclofen (LIORESAL) 10 MG tablet Take 1 tablet (10 mg total) by mouth daily. 10/21/17 10/21/18  Fisher, Roselyn BeringSusan W, PA-C  HYDROcodone-acetaminophen (NORCO) 5-325 MG tablet Take 1 tablet by mouth every 4 (four) hours as needed for moderate pain. DO NOT FILL WITHOUT ABX 10/17/16   Willy Eddyobinson, Patrick, MD  meloxicam (MOBIC) 15 MG tablet Take 1 tablet (15 mg total) by mouth daily. 10/21/17 10/21/18  Fisher, Roselyn BeringSusan  W, PA-C  metroNIDAZOLE (FLAGYL) 500 MG tablet Take 1 tablet (500 mg total) by mouth 2 (two) times daily. 12/05/16   Rockne MenghiniNorman, Anne-Caroline, MD  polyethylene glycol (MIRALAX / GLYCOLAX) packet Take 17 g by mouth daily. Mix one tablespoon with 8oz of your favorite juice or water every day until you are having soft formed stools. Then start taking once daily if you didn't have a stool the day before. 10/17/16   Willy Eddyobinson, Patrick, MD  promethazine (PHENERGAN) 12.5 MG tablet Take 1 tablet (12.5 mg total) by mouth every 6 (six) hours as needed for nausea or vomiting. 10/17/16   Willy Eddyobinson, Patrick, MD    Allergies Patient has no known allergies.  No family history on file.  Social History Social History   Tobacco Use  . Smoking status: Current Every Day Smoker    Packs/day: 0.50    Types: Cigarettes  . Smokeless tobacco: Never Used  Substance Use Topics  . Alcohol use: Yes    Comment: occas  . Drug use: No    Review of Systems Constitutional: No fever/chills Eyes: No visual changes. ENT: No sore throat. No stiff neck no neck pain Cardiovascular: Denies chest pain. Respiratory: Denies shortness of breath. Gastrointestinal: See HPI genitourinary: Negative for dysuria. Musculoskeletal: Negative lower extremity swelling Skin: Negative for rash. Neurological: Negative for severe headaches, focal weakness or numbness.   ____________________________________________   PHYSICAL EXAM:  VITAL SIGNS: ED Triage Vitals [10/06/18 1722]  Enc Vitals  Group     BP (!) 151/93     Pulse Rate 82     Resp 16     Temp 98.4 F (36.9 C)     Temp Source Oral     SpO2 97 %     Weight 260 lb (117.9 kg)     Height      Head Circumference      Peak Flow      Pain Score 10     Pain Loc      Pain Edu?      Excl. in GC?     Constitutional: Alert and oriented.  If he does not feel well but non-toxic Eyes: Conjunctivae are normal Head: Atraumatic HEENT: No congestion/rhinnorhea. Mucous membranes are  moist.  Oropharynx non-erythematous Neck:   Nontender with no meningismus, no masses, no stridor Cardiovascular: Normal rate, regular rhythm. Grossly normal heart sounds.  Good peripheral circulation. Respiratory: Normal respiratory effort.  No retractions. Lungs CTAB. Abdominal: Soft and positive left lower quadrant abdominal discomfort. No distention. No guarding no rebound Back:  There is no focal tenderness or step off.  there is no midline tenderness there are no lesions noted. there is no CVA tenderness  Musculoskeletal: No lower extremity tenderness, no upper extremity tenderness. No joint effusions, no DVT signs strong distal pulses no edema Neurologic:  Normal speech and language. No gross focal neurologic deficits are appreciated.  Skin:  Skin is warm, dry and intact. No rash noted. Psychiatric: Mood and affect are normal. Speech and behavior are normal.  ____________________________________________   LABS (all labs ordered are listed, but only abnormal results are displayed)  Labs Reviewed  COMPREHENSIVE METABOLIC PANEL - Abnormal; Notable for the following components:      Result Value   Glucose, Bld 126 (*)    Creatinine, Ser 1.37 (*)    All other components within normal limits  CBC - Abnormal; Notable for the following components:   RBC 6.02 (*)    All other components within normal limits  LIPASE, BLOOD  URINALYSIS, COMPLETE (UACMP) WITH MICROSCOPIC    Pertinent labs  results that were available during my care of the patient were reviewed by me and considered in my medical decision making (see chart for details). ____________________________________________  EKG  I personally interpreted any EKGs ordered by me or triage  ____________________________________________  RADIOLOGY  Pertinent labs & imaging results that were available during my care of the patient were reviewed by me and considered in my medical decision making (see chart for details). If possible,  patient and/or family made aware of any abnormal findings.  No results found. ____________________________________________    PROCEDURES  Procedure(s) performed: None  Procedures  Critical Care performed: None  ____________________________________________   INITIAL IMPRESSION / ASSESSMENT AND PLAN / ED COURSE  Pertinent labs & imaging results that were available during my care of the patient were reviewed by me and considered in my medical decision making (see chart for details).  Patient here with nausea vomiting and concerned about recurrent diverticulitis blood work is reassuring vital signs are reassuring we will give him IV fluids, pain medication, antiemetics, and we will see if we can obtain CT to verify the diagnosis and see if there is complication such as perforation  ----------------------------------------- 9:03 PM on 10/06/2018 -----------------------------------------  Patient CT scan shows uncomplicated diverticulitis we are giving him antibiotics however he continues to complain of significant pain and nausea and has not been able to hold things down despite  3 different doses of nausea medication.  We will therefore admit.  ----------------------------------------- 10:24 PM on 10/06/2018 -----------------------------------------  Better controlled after Dilaudid.  Hospitalist has admitted.   ____________________________________________   FINAL CLINICAL IMPRESSION(S) / ED DIAGNOSES  Final diagnoses:  None      This chart was dictated using voice recognition software.  Despite best efforts to proofread,  errors can occur which can change meaning.      Jeanmarie PlantMcShane,  A, MD 10/06/18 1839    Jeanmarie PlantMcShane,  A, MD 10/06/18 2104    Jeanmarie PlantMcShane,  A, MD 10/06/18 2224

## 2018-10-06 NOTE — ED Notes (Signed)
As pt is returning from CT, pt appears nauseous and dry heaving.  EDP, McShane notified; see new orders.

## 2018-10-07 ENCOUNTER — Other Ambulatory Visit: Payer: Self-pay

## 2018-10-07 LAB — BASIC METABOLIC PANEL
Anion gap: 8 (ref 5–15)
BUN: 13 mg/dL (ref 6–20)
CO2: 26 mmol/L (ref 22–32)
Calcium: 9.1 mg/dL (ref 8.9–10.3)
Chloride: 106 mmol/L (ref 98–111)
Creatinine, Ser: 1.21 mg/dL (ref 0.61–1.24)
GFR calc non Af Amer: >60 mL/min (ref >60)
Glucose, Bld: 117 mg/dL — ABNORMAL HIGH (ref 70–99)
Potassium: 3.8 mmol/L (ref 3.5–5.1)
Sodium: 140 mmol/L (ref 135–145)

## 2018-10-07 LAB — CBC
HEMATOCRIT: 48.9 % (ref 39.0–52.0)
Hemoglobin: 15.9 g/dL (ref 13.0–17.0)
MCH: 26.9 pg (ref 26.0–34.0)
MCHC: 32.5 g/dL (ref 30.0–36.0)
MCV: 82.7 fL (ref 80.0–100.0)
Platelets: 261 10*3/uL (ref 150–400)
RBC: 5.91 MIL/uL — ABNORMAL HIGH (ref 4.22–5.81)
RDW: 15.3 % (ref 11.5–15.5)
WBC: 9.3 10*3/uL (ref 4.0–10.5)
nRBC: 0 % (ref 0.0–0.2)

## 2018-10-07 MED ORDER — HYDRALAZINE HCL 20 MG/ML IJ SOLN
10.0000 mg | Freq: Four times a day (QID) | INTRAMUSCULAR | Status: DC | PRN
  Administered 2018-10-07 – 2018-10-11 (×7): 10 mg via INTRAVENOUS
  Filled 2018-10-07 (×7): qty 1

## 2018-10-07 MED ORDER — LABETALOL HCL 200 MG PO TABS
300.0000 mg | ORAL_TABLET | Freq: Two times a day (BID) | ORAL | Status: DC
  Administered 2018-10-07 – 2018-10-08 (×3): 300 mg via ORAL
  Filled 2018-10-07 (×6): qty 1.5

## 2018-10-07 NOTE — H&P (Signed)
[facility] - [place] at [facility]   PATIENT NAME: Brad [name]    MR#:  [id number]  DATE OF BIRTH:  1976/08/13  DATE OF ADMISSION:  10/06/2018  PRIMARY CARE PHYSICIAN: Patient, No Pcp Per   REQUESTING/REFERRING PHYSICIAN: McShane  CHIEF COMPLAINT:   Chief Complaint  Patient presents with  . Abdominal Pain    HISTORY OF PRESENT ILLNESS: Brad [name]  is a 43 y.o. male with a known history of asthma, diverticulitis, hypertension-presented with complaint of abdominal pain, nausea and vomiting.  He had no diarrhea.  He had several episodes of diverticulitis in the past and this was similar episode so came to emergency room. CT scan reported to have acute diverticulitis.  Patient denied any complaint of blood in the stool. He required multiple medications for nausea control and pain control so ER physician suggested to admit to hospitalist service.  PAST MEDICAL HISTORY:   Past Medical History:  Diagnosis Date  . Asthma   . Diverticulitis   . Hypertension     PAST SURGICAL HISTORY:  Past Surgical History:  Procedure Laterality Date  . ANKLE SURGERY      SOCIAL HISTORY:  Social History   Tobacco Use  . Smoking status: Current Every Day Smoker    Packs/day: 0.50    Types: Cigarettes  . Smokeless tobacco: Never Used  Substance Use Topics  . Alcohol use: Yes    Comment: occas    FAMILY HISTORY:  Family History  Problem Relation Age of Onset  . Hypertension Mother     DRUG ALLERGIES: No Known Allergies  REVIEW OF SYSTEMS:   CONSTITUTIONAL: No fever, fatigue or weakness.  EYES: No blurred or double vision.  EARS, NOSE, AND THROAT: No tinnitus or ear pain.  RESPIRATORY: No cough, shortness of breath, wheezing or hemoptysis.  CARDIOVASCULAR: No chest pain, orthopnea, edema.  GASTROINTESTINAL: He had nausea, vomiting, no diarrhea, he had abdominal pain.  GENITOURINARY: No dysuria, hematuria.  ENDOCRINE: No polyuria, nocturia,  HEMATOLOGY: No  anemia, easy bruising or bleeding SKIN: No rash or lesion. MUSCULOSKELETAL: No joint pain or arthritis.   NEUROLOGIC: No tingling, numbness, weakness.  PSYCHIATRY: No anxiety or depression.   MEDICATIONS AT HOME:  Prior to Admission medications   Medication Sig Start Date End Date Taking? Authorizing Provider  baclofen (LIORESAL) 10 MG tablet Take 1 tablet (10 mg total) by mouth daily. Patient not taking: Reported on 10/06/2018 10/21/17 10/21/18  Sherrie MustacheFisher, Roselyn BeringSusan W, PA-C  HYDROcodone-acetaminophen (NORCO) 5-325 MG tablet Take 1 tablet by mouth every 4 (four) hours as needed for moderate pain. DO NOT FILL WITHOUT ABX 10/17/16   Willy Eddyobinson, Patrick, MD  meloxicam (MOBIC) 15 MG tablet Take 1 tablet (15 mg total) by mouth daily. 10/21/17 10/21/18  Fisher, Roselyn BeringSusan W, PA-C  metroNIDAZOLE (FLAGYL) 500 MG tablet Take 1 tablet (500 mg total) by mouth 2 (two) times daily. 12/05/16   Rockne MenghiniNorman, Anne-Caroline, MD  polyethylene glycol (MIRALAX / GLYCOLAX) packet Take 17 g by mouth daily. Mix one tablespoon with 8oz of your favorite juice or water every day until you are having soft formed stools. Then start taking once daily if you didn't have a stool the day before. 10/17/16   Willy Eddyobinson, Patrick, MD  promethazine (PHENERGAN) 12.5 MG tablet Take 1 tablet (12.5 mg total) by mouth every 6 (six) hours as needed for nausea or vomiting. 10/17/16   Willy Eddyobinson, Patrick, MD      PHYSICAL EXAMINATION:   VITAL SIGNS: Blood pressure (!) 164/100, pulse 63,  temperature 98.6 F (37 C), temperature source Oral, resp. rate 20, height 5\' 5"  (1.651 m), weight 118.7 kg, SpO2 96 %.  GENERAL:  43 y.o.-year-old patient lying in the bed with no acute distress.  EYES: Pupils equal, round, reactive to light and accommodation. No scleral icterus. Extraocular muscles intact.  HEENT: Head atraumatic, normocephalic. Oropharynx and nasopharynx clear.  NECK:  Supple, no jugular venous distention. No thyroid enlargement, no tenderness.  LUNGS: Normal  breath sounds bilaterally, no wheezing, rales,rhonchi or crepitation. No use of accessory muscles of respiration.  CARDIOVASCULAR: S1, S2 normal. No murmurs, rubs, or gallops.  ABDOMEN: Soft, left lower quadrant tender, nondistended. Bowel sounds present. No organomegaly or mass.  EXTREMITIES: No pedal edema, cyanosis, or clubbing.  NEUROLOGIC: Cranial nerves II through XII are intact. Muscle strength 5/5 in all extremities. Sensation intact. Gait not checked.  PSYCHIATRIC: The patient is alert and oriented x 3.  SKIN: No obvious rash, lesion, or ulcer.   LABORATORY PANEL:   CBC Recent Labs  Lab 10/06/18 1727  WBC 8.0  HGB 15.9  HCT 48.9  PLT 274  MCV 81.2  MCH 26.4  MCHC 32.5  RDW 15.5   ------------------------------------------------------------------------------------------------------------------  Chemistries  Recent Labs  Lab 10/06/18 1727  NA 140  K 4.4  CL 107  CO2 25  GLUCOSE 126*  BUN 17  CREATININE 1.37*  CALCIUM 9.6  AST 24  ALT 14  ALKPHOS 58  BILITOT 0.7   ------------------------------------------------------------------------------------------------------------------ estimated creatinine clearance is 83.9 mL/min (A) (by C-G formula based on SCr of 1.37 mg/dL (H)). ------------------------------------------------------------------------------------------------------------------ No results for input(s): TSH, T4TOTAL, T3FREE, THYROIDAB in the last 72 hours.  Invalid input(s): FREET3   Coagulation profile No results for input(s): INR, PROTIME in the last 168 hours. ------------------------------------------------------------------------------------------------------------------- No results for input(s): DDIMER in the last 72 hours. -------------------------------------------------------------------------------------------------------------------  Cardiac Enzymes No results for input(s): CKMB, TROPONINI, MYOGLOBIN in the last 168 hours.  Invalid  input(s): CK ------------------------------------------------------------------------------------------------------------------ Invalid input(s): POCBNP  ---------------------------------------------------------------------------------------------------------------  Urinalysis    Component Value Date/Time   COLORURINE YELLOW (A) 10/06/2018 1727   APPEARANCEUR CLEAR (A) 10/06/2018 1727   APPEARANCEUR Clear 03/23/2012 1704   LABSPEC 1.013 10/06/2018 1727   LABSPEC 1.021 03/23/2012 1704   PHURINE 8.0 10/06/2018 1727   GLUCOSEU 150 (A) 10/06/2018 1727   GLUCOSEU Negative 03/23/2012 1704   HGBUR NEGATIVE 10/06/2018 1727   BILIRUBINUR NEGATIVE 10/06/2018 1727   BILIRUBINUR Negative 03/23/2012 1704   KETONESUR NEGATIVE 10/06/2018 1727   PROTEINUR 100 (A) 10/06/2018 1727   NITRITE NEGATIVE 10/06/2018 1727   LEUKOCYTESUR NEGATIVE 10/06/2018 1727   LEUKOCYTESUR Negative 03/23/2012 1704     RADIOLOGY: Ct Abdomen Pelvis W Contrast  Result Date: 10/06/2018 CLINICAL DATA:  Progressing abdominal pain since yesterday. History of diverticulitis. EXAM: CT ABDOMEN AND PELVIS WITH CONTRAST TECHNIQUE: Multidetector CT imaging of the abdomen and pelvis was performed using the standard protocol following bolus administration of intravenous contrast. CONTRAST:  100mL OMNIPAQUE IOHEXOL 300 MG/ML  SOLN COMPARISON:  12/05/2016 FINDINGS: Lower chest: Mild cardiomegaly without pericardial effusion. Bibasilar dependent atelectasis. No effusion or pneumothorax. No pulmonary consolidation or dominant mass. Hepatobiliary: Faint vascular blush in the right hepatic lobe which becomes isodense to adjacent liver on repeat imaging likely representing transient contrast filling of this segment of liver versus small hemangioma. No biliary dilatation. Biliary sludge is identified within the gallbladder as before without secondary signs of acute cholecystitis. Pancreas: Normal Spleen: Normal Adrenals/Urinary Tract: Stable left  renal cysts, measuring 11 mm in the upper  pole and 13 mm in the interpolar aspect. No enhancing renal mass. No uropathy. The urinary bladder is unremarkable for the degree of distention. Normal bilateral adrenal glands. Stomach/Bowel: Acute uncomplicated diverticulitis of the distal descending colon with mild pericolonic inflammatory change but no abscess or bowel obstruction is seen. Stomach, small intestine and remainder of large bowel are unremarkable. Normal appearing appendix is visualized. Vascular/Lymphatic: No significant vascular findings are present. No enlarged abdominal or pelvic lymph nodes. Reproductive: Mild prostatomegaly impressing upon the base of the bladder. Mild fatty induration about prostate can not exclude the possibility of inflammatory change/prostatitis. Other: No intraperitoneal free air nor free fluid. Musculoskeletal: Mild lower lumbar facet arthropathy. Small fat containing umbilical hernia. IMPRESSION: 1. Acute uncomplicated diverticulitis of the distal descending colon. 2. Stable left renal cysts. 3. Faint vascular blush in the right hepatic lobe which becomes isodense to adjacent liver on repeat imaging likely to represent transient contrast filling versus small hemangioma. 4. Mild prostatomegaly impressing upon the base of the bladder. Mild fatty induration about the prostate may represent stigmata of prostatitis. Electronically Signed   By: Tollie Eth M.D.   On: 10/06/2018 20:44    EKG: Orders placed or performed during the hospital encounter of 04/18/16  . ED EKG  . ED EKG  . EKG 12-Lead  . EKG 12-Lead  . EKG 12-Lead  . EKG 12-Lead    IMPRESSION AND PLAN:  *Acute diverticulitis Symptomatic treatment with IV pain and nausea medications. IV Cipro and Flagyl. IV fluids and keep n.p.o.  *Hypertension He is not on any medications at home we will keep on hydralazine as needed IV.  *Acute renal insufficiency Need to follow renal function after IV  fluids.  *Active smoking Counseled to quit smoking for 4 minutes and offered nicotine patch.  All the records are reviewed and case discussed with ED provider. Management plans discussed with the patient, family and they are in agreement.  CODE STATUS: Full.    Code Status Orders  (From admission, onward)         Start     Ordered   10/06/18 2346  Full code  Continuous     10/06/18 2345        Code Status History    Date Active Date Inactive Code Status Order ID Comments User Context   04/18/2016 1943 04/19/2016 0809 Full Code [id number]  Enedina Finner, MD Inpatient   05/31/2015 0400 06/01/2015 0034 Full Code [id number]  Arnaldo Natal, MD Inpatient       TOTAL TIME TAKING CARE OF THIS PATIENT: 45 minutes.    Altamese Dilling M.D on 10/07/2018   Between 7am to 6pm - Pager - [phone]  After 6pm go to [email or web address] - password [email or web address]  [facility]  Office  [phone]  CC: Primary care physician; Patient, No Pcp Per   Note: This dictation was prepared with Dragon dictation along with smaller phrase technology. Any transcriptional errors that result from this process are unintentional.

## 2018-10-07 NOTE — Progress Notes (Signed)
Pts BP 171/114, MD Allena KatzPatel notified. PRN Hydralazine given. MD adding Labetalol.

## 2018-10-07 NOTE — Progress Notes (Signed)
[facility] - [place] at [facility]                                                                                                                                                                                  Patient Demographics   Brad [name], is a 43 y.o. male, DOB - 1975-12-12, ZMO:[id number]  Admit date - 10/06/2018   Admitting Physician Altamese Dilling, MD  Outpatient Primary MD for the patient is Patient, No Pcp Per   LOS - 1  Subjective: Patient states that his abdominal pain is improved.  Blood pressures currently elevated.    Review of Systems:   CONSTITUTIONAL: No documented fever. No fatigue, weakness. No weight gain, no weight loss.  EYES: No blurry or double vision.  ENT: No tinnitus. No postnasal drip. No redness of the oropharynx.  RESPIRATORY: No cough, no wheeze, no hemoptysis. No dyspnea.  CARDIOVASCULAR: No chest pain. No orthopnea. No palpitations. No syncope.  GASTROINTESTINAL: No nausea, no vomiting or diarrhea.  Positive abdominal pain. No melena or hematochezia.  GENITOURINARY: No dysuria or hematuria.  ENDOCRINE: No polyuria or nocturia. No heat or cold intolerance.  HEMATOLOGY: No anemia. No bruising. No bleeding.  INTEGUMENTARY: No rashes. No lesions.  MUSCULOSKELETAL: No arthritis. No swelling. No gout.  NEUROLOGIC: No numbness, tingling, or ataxia. No seizure-type activity.  PSYCHIATRIC: No anxiety. No insomnia. No ADD.    Vitals:   Vitals:   10/06/18 2343 10/06/18 2344 10/07/18 0846 10/07/18 1032  BP: (!) 164/100  (!) 171/114 (!) 157/118  Pulse: 63  71   Resp: 20  20   Temp: 98.6 F (37 C)  98.5 F (36.9 C)   TempSrc: Oral  Oral   SpO2: 96%  94%   Weight:  118.7 kg    Height:  5\' 5"  (1.651 m)      Wt Readings from Last 3 Encounters:  10/06/18 118.7 kg  12/05/16 116.1 kg  10/17/16 111.1 kg     Intake/Output Summary (Last 24 hours) at 10/07/2018 1324 Last data filed at 10/07/2018 0400 Gross per 24 hour   Intake 1518.88 ml  Output -  Net 1518.88 ml    Physical Exam:   GENERAL: Pleasant-appearing in no apparent distress.  HEAD, EYES, EARS, NOSE AND THROAT: Atraumatic, normocephalic. Extraocular muscles are intact. Pupils equal and reactive to light. Sclerae anicteric. No conjunctival injection. No oro-pharyngeal erythema.  NECK: Supple. There is no jugular venous distention. No bruits, no lymphadenopathy, no thyromegaly.  HEART: Regular rate and rhythm,. No murmurs, no rubs, no clicks.  LUNGS: Clear to auscultation bilaterally. No rales or rhonchi. No wheezes.  ABDOMEN: Soft, flat, nontender, nondistended. Has good bowel sounds.  No hepatosplenomegaly appreciated.  EXTREMITIES: No evidence of any cyanosis, clubbing, or peripheral edema.  +2 pedal and radial pulses bilaterally.  NEUROLOGIC: The patient is alert, awake, and oriented x3 with no focal motor or sensory deficits appreciated bilaterally.  SKIN: Moist and warm with no rashes appreciated.  Psych: Not anxious, depressed LN: No inguinal LN enlargement    Antibiotics   Anti-infectives (From admission, onward)   Start     Dose/Rate Route Frequency Ordered Stop   10/06/18 2230  metroNIDAZOLE (FLAGYL) IVPB 500 mg     500 mg 100 mL/hr over 60 Minutes Intravenous Every 8 hours 10/06/18 2215     10/06/18 2215  ciprofloxacin (CIPRO) IVPB 400 mg     400 mg 200 mL/hr over 60 Minutes Intravenous Every 12 hours 10/06/18 2215     10/06/18 2100  Ampicillin-Sulbactam (UNASYN) 3 g in sodium chloride 0.9 % 100 mL IVPB     3 g 200 mL/hr over 30 Minutes Intravenous  Once 10/06/18 2054 10/06/18 2302      Medications   Scheduled Meds: . heparin  5,000 Units Subcutaneous Q8H  . labetalol  300 mg Oral BID   Continuous Infusions: . sodium chloride 75 mL/hr at 10/06/18 2300  . ciprofloxacin 400 mg (10/07/18 1048)  . metronidazole 500 mg (10/07/18 0538)   PRN Meds:.docusate sodium, hydrALAZINE, HYDROcodone-acetaminophen, morphine injection,  ondansetron (ZOFRAN) IV   Data Review:   Micro Results No results found for this or any previous visit (from the past 240 hour(s)).  Radiology Reports Ct Abdomen Pelvis W Contrast  Result Date: 10/06/2018 CLINICAL DATA:  Progressing abdominal pain since yesterday. History of diverticulitis. EXAM: CT ABDOMEN AND PELVIS WITH CONTRAST TECHNIQUE: Multidetector CT imaging of the abdomen and pelvis was performed using the standard protocol following bolus administration of intravenous contrast. CONTRAST:  OMNIPAQUE IOHEXOL 300 MG/ML  SOLN COMPARISON:  12/05/2016 FINDINGS: Lower chest: Mild cardiomegaly without pericardial effusion. Bibasilar dependent atelectasis. No effusion or pneumothorax. No pulmonary consolidation or dominant mass. Hepatobiliary: Faint vascular blush in the right hepatic lobe which becomes isodense to adjacent liver on repeat imaging likely representing transient contrast filling of this segment of liver versus small hemangioma. No biliary dilatation. Biliary sludge is identified within the gallbladder as before without secondary signs of acute cholecystitis. Pancreas: Normal Spleen: Normal Adrenals/Urinary Tract: Stable left renal cysts, measuring 11 mm in the upper pole and 13 mm in the interpolar aspect. No enhancing renal mass. No uropathy. The urinary bladder is unremarkable for the degree of distention. Normal bilateral adrenal glands. Stomach/Bowel: Acute uncomplicated diverticulitis of the distal descending colon with mild pericolonic inflammatory change but no abscess or bowel obstruction is seen. Stomach, small intestine and remainder of large bowel are unremarkable. Normal appearing appendix is visualized. Vascular/Lymphatic: No significant vascular findings are present. No enlarged abdominal or pelvic lymph nodes. Reproductive: Mild prostatomegaly impressing upon the base of the bladder. Mild fatty induration about prostate can not exclude the possibility of inflammatory  change/prostatitis. Other: No intraperitoneal free air nor free fluid. Musculoskeletal: Mild lower lumbar facet arthropathy. Small fat containing umbilical hernia. IMPRESSION: 1. Acute uncomplicated diverticulitis of the distal descending colon. 2. Stable left renal cysts. 3. Faint vascular blush in the right hepatic lobe which becomes isodense to adjacent liver on repeat imaging likely to represent transient contrast filling versus small hemangioma. 4. Mild prostatomegaly impressing upon the base of the bladder. Mild fatty induration about the prostate may represent stigmata of prostatitis. Electronically Signed   By: Onalee Hua  Sterling BigKwon M.D.   On: 10/06/2018 20:44     CBC Recent Labs  Lab 10/06/18 1727 10/07/18 0407  WBC 8.0 9.3  HGB 15.9 15.9  HCT 48.9 48.9  PLT 274 261  MCV 81.2 82.7  MCH 26.4 26.9  MCHC 32.5 32.5  RDW 15.5 15.3    Chemistries  Recent Labs  Lab 10/06/18 1727 10/07/18 0407  NA 140 140  K 4.4 3.8  CL 107 106  CO2 25 26  GLUCOSE 126* 117*  BUN 17 13  CREATININE 1.37* 1.21  CALCIUM 9.6 9.1  AST 24  --   ALT 14  --   ALKPHOS 58  --   BILITOT 0.7  --    ------------------------------------------------------------------------------------------------------------------ estimated creatinine clearance is 94.9 mL/min (by C-G formula based on SCr of 1.21 mg/dL). ------------------------------------------------------------------------------------------------------------------ No results for input(s): HGBA1C in the last 72 hours. ------------------------------------------------------------------------------------------------------------------ No results for input(s): CHOL, HDL, LDLCALC, TRIG, CHOLHDL, LDLDIRECT in the last 72 hours. ------------------------------------------------------------------------------------------------------------------ No results for input(s): TSH, T4TOTAL, T3FREE, THYROIDAB in the last 72 hours.  Invalid input(s):  FREET3 ------------------------------------------------------------------------------------------------------------------ No results for input(s): VITAMINB12, FOLATE, FERRITIN, TIBC, IRON, RETICCTPCT in the last 72 hours.  Coagulation profile No results for input(s): INR, PROTIME in the last 168 hours.  No results for input(s): DDIMER in the last 72 hours.  Cardiac Enzymes No results for input(s): CKMB, TROPONINI, MYOGLOBIN in the last 168 hours.  Invalid input(s): CK ------------------------------------------------------------------------------------------------------------------ Invalid input(s): POCBNP    Assessment & Plan   *Acute diverticulitis Continue IV Cipro and Flagyl. Continue IV fluids  Start full liquid diet  *Hypertension Pressure still elevated will start labetalol   *Acute renal insufficiency Resolved with IV fluid  *Active smoking Counseled to quit smoking by admitting physician  All the records are reviewed and case discussed with ED      Code Status Orders  (From admission, onward)         Start     Ordered   10/06/18 2346  Full code  Continuous     10/06/18 2345        Code Status History    Date Active Date Inactive Code Status Order ID Comments User Context   04/18/2016 1943 04/19/2016 0809 Full Code [id number]  Enedina FinnerPatel, Sona, MD Inpatient   05/31/2015 0400 06/01/2015 0034 Full Code [id number]  Arnaldo Nataliamond, Michael S, MD Inpatient           Consults none  DVT Prophylaxis  Lovenox   Lab Results  Component Value Date   PLT 261 10/07/2018     Time Spent in minutes  35min Greater than 50% of time spent in care coordination and counseling patient regarding the condition and plan of care.   Auburn BilberryShreyang Justinian Miano M.D on 10/07/2018 at 1:24 PM  Between 7am to 6pm - Pager - [phone]  After 6pm go to [email or web address] - Social research officer, governmentpassword [email or web address]  [facility]   Office  [phone]

## 2018-10-08 ENCOUNTER — Inpatient Hospital Stay: Payer: Self-pay

## 2018-10-08 LAB — CBC
HCT: 46.4 % (ref 39.0–52.0)
Hemoglobin: 15 g/dL (ref 13.0–17.0)
MCH: 26.8 pg (ref 26.0–34.0)
MCHC: 32.3 g/dL (ref 30.0–36.0)
MCV: 82.9 fL (ref 80.0–100.0)
NRBC: 0 % (ref 0.0–0.2)
Platelets: 225 10*3/uL (ref 150–400)
RBC: 5.6 MIL/uL (ref 4.22–5.81)
RDW: 15.5 % (ref 11.5–15.5)
WBC: 11.4 10*3/uL — ABNORMAL HIGH (ref 4.0–10.5)

## 2018-10-08 LAB — COMPREHENSIVE METABOLIC PANEL
ALT: 11 U/L (ref 0–44)
AST: 16 U/L (ref 15–41)
Albumin: 3.8 g/dL (ref 3.5–5.0)
Alkaline Phosphatase: 44 U/L (ref 38–126)
Anion gap: 10 (ref 5–15)
BUN: 15 mg/dL (ref 6–20)
CO2: 22 mmol/L (ref 22–32)
Calcium: 8.6 mg/dL — ABNORMAL LOW (ref 8.9–10.3)
Chloride: 106 mmol/L (ref 98–111)
Creatinine, Ser: 1.41 mg/dL — ABNORMAL HIGH (ref 0.61–1.24)
GFR calc Af Amer: >60 mL/min (ref >60)
GFR calc non Af Amer: >60 mL/min (ref >60)
Glucose, Bld: 159 mg/dL — ABNORMAL HIGH (ref 70–99)
POTASSIUM: 3.4 mmol/L — AB (ref 3.5–5.1)
Sodium: 138 mmol/L (ref 135–145)
Total Bilirubin: 0.9 mg/dL (ref 0.3–1.2)
Total Protein: 7.1 g/dL (ref 6.5–8.1)

## 2018-10-08 LAB — HIV ANTIBODY (ROUTINE TESTING W REFLEX): HIV SCREEN 4TH GENERATION: NONREACTIVE

## 2018-10-08 LAB — LIPASE, BLOOD: Lipase: 21 U/L (ref 11–51)

## 2018-10-08 LAB — LACTIC ACID, PLASMA: Lactic Acid, Venous: 1.5 mmol/L (ref 0.5–1.9)

## 2018-10-08 MED ORDER — SODIUM CHLORIDE 0.9 % IV SOLN
1.0000 g | Freq: Three times a day (TID) | INTRAVENOUS | Status: DC
  Administered 2018-10-08 – 2018-10-11 (×8): 1 g via INTRAVENOUS
  Filled 2018-10-08 (×10): qty 1

## 2018-10-08 MED ORDER — IOPAMIDOL (ISOVUE-300) INJECTION 61%
15.0000 mL | INTRAVENOUS | Status: AC
  Administered 2018-10-08 (×2): 15 mL via ORAL

## 2018-10-08 MED ORDER — IOHEXOL 300 MG/ML  SOLN
100.0000 mL | Freq: Once | INTRAMUSCULAR | Status: AC | PRN
  Administered 2018-10-08: 100 mL via INTRAVENOUS

## 2018-10-08 MED ORDER — HYDROMORPHONE HCL 1 MG/ML IJ SOLN
2.0000 mg | INTRAMUSCULAR | Status: DC | PRN
  Administered 2018-10-08 – 2018-10-11 (×20): 2 mg via INTRAVENOUS
  Filled 2018-10-08 (×22): qty 2

## 2018-10-08 NOTE — Progress Notes (Signed)
MD Patel at bedside.

## 2018-10-08 NOTE — Progress Notes (Signed)
Verbal order by MD Allena Katz for NPO. Order placed.

## 2018-10-08 NOTE — Consult Note (Signed)
Subjective:   CC: diverticulitis  HPI:  Brad [name] is a 43 y.o. male who is consulted by Allena Katz for evaluation of  above cc.  Symptoms were first noted a few days ago. Pain is sharp, localized to the LLQ.  Associated with nothing specific, exacerbated by nothing specific.   He was recovering well with report of improved pain the past couple days since admission, and IV abx.  Normal BM this am.  He states pain started shortly after, intense pain and diaphoretic per report.  He states IV pain meds has improved pain some but pain this am was worst than pain on admission.  Pt with hx of diverticulitis in the past.  This is fourth episode.  Only required abx tx in past, never had followup with providers outside hospital admissions.      Past Medical History:  has a past medical history of Asthma, Diverticulitis, and Hypertension.  Past Surgical History:  has a past surgical history that includes Ankle surgery.  Family History: family history includes Hypertension in his mother.  Social History:  reports that he has been smoking cigarettes. He has been smoking about 0.50 packs per day. He has never used smokeless tobacco. He reports current alcohol use. He reports that he does not use drugs.  Current Medications:  Medications Prior to Admission  Medication Sig Dispense Refill  . baclofen (LIORESAL) 10 MG tablet Take 1 tablet (10 mg total) by mouth daily. (Patient not taking: Reported on 10/06/2018) 30 tablet 1  . HYDROcodone-acetaminophen (NORCO) 5-325 MG tablet Take 1 tablet by mouth every 4 (four) hours as needed for moderate pain. DO NOT FILL WITHOUT ABX 6 tablet 0  . meloxicam (MOBIC) 15 MG tablet Take 1 tablet (15 mg total) by mouth daily. 30 tablet 2  . metroNIDAZOLE (FLAGYL) 500 MG tablet Take 1 tablet (500 mg total) by mouth 2 (two) times daily. 20 tablet 0  . polyethylene glycol (MIRALAX / GLYCOLAX) packet Take 17 g by mouth daily. Mix one tablespoon with 8oz of your favorite juice or  water every day until you are having soft formed stools. Then start taking once daily if you didn't have a stool the day before. 30 each 0  . promethazine (PHENERGAN) 12.5 MG tablet Take 1 tablet (12.5 mg total) by mouth every 6 (six) hours as needed for nausea or vomiting. 12 tablet 0    Allergies:  Allergies as of 10/06/2018  . (No Known Allergies)    ROS:  General: Denies weight loss, weight gain, fatigue, fevers, chills, and night sweats. Eyes: Denies blurry vision, double vision, eye pain, itchy eyes, and tearing. Ears: Denies hearing loss, earache, and ringing in ears. Nose: Denies sinus pain, congestion, infections, runny nose, and nosebleeds. Mouth/throat: Denies hoarseness, sore throat, bleeding gums, and difficulty swallowing. Heart: Denies chest pain, palpitations, racing heart, irregular heartbeat, leg pain or swelling, and decreased activity tolerance. Respiratory: Denies breathing difficulty, shortness of breath, wheezing, cough, and sputum. GI: Denies change in appetite, heartburn, nausea, vomiting, constipation, diarrhea, and blood in stool. GU: Denies difficulty urinating, pain with urinating, urgency, frequency, blood in urine. Musculoskeletal: Denies joint stiffness, pain, swelling, muscle weakness. Skin: Denies rash, itching, mass, tumors, sores, and boils Neurologic: Denies headache, fainting, dizziness, seizures, numbness, and tingling. Psychiatric: Denies depression, anxiety, difficulty sleeping, and memory loss. Endocrine: Denies heat or cold intolerance, and increased thirst or urination. Blood/lymph: Denies easy bruising, easy bruising, and swollen glands   Objective:     BP (!) 156/110 (  BP Location: Left Arm)   Pulse 81   Temp 98.4 F (36.9 C) (Oral)   Resp 16   Ht 5\' 5"  (1.651 m)   Wt 118.7 kg   SpO2 100%   BMI 43.55 kg/m    Constitutional :  alert, cooperative, appears stated age and no distress  Lymphatics/Throat:  no asymmetry, masses, or  scars  Respiratory:  clear to auscultation bilaterally  Cardiovascular:  regular rate and rhythm  Gastrointestinal: soft, focal guarding with palpation in suprapubic, LLQ.  minor rebound tenderness with RLQ palpation, but no generalized peritionitis.   Musculoskeletal: Steady gait and movement  Skin: Cool and moist  Psychiatric: Normal affect, non-agitated, not confused       LABS:  CMP Latest Ref Rng & Units 10/08/2018 10/07/2018 10/06/2018  Glucose 70 - 99 mg/dL 914(N) 829(F) 621(H)  BUN 6 - 20 mg/dL 15 13 17   Creatinine 0.61 - 1.24 mg/dL 0.86(V) 7.84 6.96(E)  Sodium 135 - 145 mmol/L 138 140 140  Potassium 3.5 - 5.1 mmol/L 3.4(L) 3.8 4.4  Chloride 98 - 111 mmol/L 106 106 107  CO2 22 - 32 mmol/L 22 26 25   Calcium 8.9 - 10.3 mg/dL 9.5(M) 9.1 9.6  Total Protein 6.5 - 8.1 g/dL 7.1 - 7.9  Total Bilirubin 0.3 - 1.2 mg/dL 0.9 - 0.7  Alkaline Phos 38 - 126 U/L 44 - 58  AST 15 - 41 U/L 16 - 24  ALT 0 - 44 U/L 11 - 14   CBC Latest Ref Rng & Units 10/07/2018 10/06/2018 12/05/2016  WBC 4.0 - 10.5 K/uL 9.3 8.0 14.2(H)  Hemoglobin 13.0 - 17.0 g/dL 84.1 32.4 40.1  Hematocrit 39.0 - 52.0 % 48.9 48.9 47.3  Platelets 150 - 400 K/uL 261 274 247     RADS: Pending CT a/p  Assessment:      Diverticulitis- mild on admission, treated with IV abx, but acute worsening on exam today.  Plan:    KUB with no free air, but films are supine films so not most reliable to detect small amount of free air.  Exam has focal peritonitis, but vitals stable, patient states pain improved with meds, so no need for immediate surgical intervention.  Will recommend NPO, continue abx for now, and will wait repeat CT results.  Plan discussed with patient, consulting provider, and RN and all in agreement.

## 2018-10-08 NOTE — Progress Notes (Signed)
Pt stating is he is excruciating pain, 10/10. Diaphoretic BP elevated. MD Allena Katz paged and notified. MD placing orders and coming to bedside.

## 2018-10-08 NOTE — Progress Notes (Signed)
Pharmacy Antibiotic Note  Brad [name] is a 43 y.o. male admitted on 10/06/2018 with intra abdominal.  Pharmacy has been consulted for Meropenem dosing.  Plan: Meropenem 1 gm IV Q8H ordered to start 2/14 @ 20:30.  Height: 5\' 5"  (165.1 cm) Weight: 261 lb 11 oz (118.7 kg) IBW/kg (Calculated) : 61.5  Temp (24hrs), Avg:98.6 F (37 C), Min:98.2 F (36.8 C), Max:99.3 F (37.4 C)  Recent Labs  Lab 10/06/18 1727 10/07/18 0407 10/08/18 1013 10/08/18 1149  WBC 8.0 9.3  --  11.4*  CREATININE 1.37* 1.21 1.41*  --   LATICACIDVEN  --   --  1.5  --     Estimated Creatinine Clearance: 81.5 mL/min (A) (by C-G formula based on SCr of 1.41 mg/dL (H)).    No Known Allergies  Antimicrobials this admission:   >>    >>   Dose adjustments this admission:   Microbiology results:  BCx:   UCx:    Sputum:    MRSA PCR:   Thank you for allowing pharmacy to be a part of this patient's care.  Osmani Kersten D 10/08/2018 8:18 PM

## 2018-10-08 NOTE — Plan of Care (Signed)

## 2018-10-08 NOTE — Progress Notes (Signed)
[facility] - [place] [facility]                                                                                                                                                                                  Patient Demographics   Earlean PolkaFrank Koy, is a 43 y.o. male, DOB - 1975-11-23, FAO:[id number]  Admit date - 10/06/2018   Admitting Physician Altamese DillingVaibhavkumar Vachhani, MD  Outpatient Primary MD for the patient is Patient, No Pcp Per   LOS - 2  Subjective: Called by nurse due to patient having severe abdominal pain he is diaphoretic states that his pain is severe in the left lower quadrant.    Review of Systems:   CONSTITUTIONAL: Patient is sweating and perfusing uncomfortable EYES: No blurry or double vision.  ENT: No tinnitus. No postnasal drip. No redness of the oropharynx.  RESPIRATORY: No cough, no wheeze, no hemoptysis. No dyspnea.  CARDIOVASCULAR: No chest pain. No orthopnea. No palpitations. No syncope.  GASTROINTESTINAL: No nausea, no vomiting or diarrhea.  Positive abdominal pain. No melena or hematochezia.  GENITOURINARY: No dysuria or hematuria.  ENDOCRINE: No polyuria or nocturia. No heat or cold intolerance.  HEMATOLOGY: No anemia. No bruising. No bleeding.  INTEGUMENTARY: No rashes. No lesions.  MUSCULOSKELETAL: No arthritis. No swelling. No gout.  NEUROLOGIC: No numbness, tingling, or ataxia. No seizure-type activity.  PSYCHIATRIC: No anxiety. No insomnia. No ADD.    Vitals:   Vitals:   10/07/18 1832 10/07/18 2331 10/08/18 0818 10/08/18 0941  BP: (!) 169/91 136/87 (!) 171/103 (!) 156/110  Pulse: 91 81 72 81  Resp: 20 16    Temp: 98.7 F (37.1 C) 98.2 F (36.8 C) 98.4 F (36.9 C)   TempSrc:  Oral Oral   SpO2: 95% 95% 98% 100%  Weight:      Height:        Wt Readings from Last 3 Encounters:  10/06/18 118.7 kg  12/05/16 116.1 kg  10/17/16 111.1 kg     Intake/Output Summary (Last 24 hours) at 10/08/2018 1031 Last data filed at  10/07/2018 1907 Gross per 24 hour  Intake 1578.09 ml  Output -  Net 1578.09 ml    Physical Exam:   GENERAL: Patient sweating appears in distress HEAD, EYES, EARS, NOSE AND THROAT: Atraumatic, normocephalic. Extraocular muscles are intact. Pupils equal and reactive to light. Sclerae anicteric. No conjunctival injection. No oro-pharyngeal erythema.  NECK: Supple. There is no jugular venous distention. No bruits, no lymphadenopathy, no thyromegaly.  HEART: Regular rate and rhythm,. No murmurs, no rubs, no clicks.  LUNGS: Clear to auscultation bilaterally. No rales or rhonchi. No wheezes.  ABDOMEN: Left quadrant tenderness mild guarding positive  bowel sounds x4   EXTREMITIES: No evidence of any cyanosis, clubbing, or peripheral edema.  +2 pedal and radial pulses bilaterally.  NEUROLOGIC: The patient is alert, awake, and oriented x3 with no focal motor or sensory deficits appreciated bilaterally.  SKIN: Moist and warm with no rashes appreciated.  Psych: Not anxious, depressed LN: No inguinal LN enlargement    Antibiotics   Anti-infectives (From admission, onward)   Start     Dose/Rate Route Frequency Ordered Stop   10/06/18 2230  metroNIDAZOLE (FLAGYL) IVPB 500 mg     500 mg 100 mL/hr over 60 Minutes Intravenous Every 8 hours 10/06/18 2215     10/06/18 2215  ciprofloxacin (CIPRO) IVPB 400 mg     400 mg 200 mL/hr over 60 Minutes Intravenous Every 12 hours 10/06/18 2215     10/06/18 2100  Ampicillin-Sulbactam (UNASYN) 3 g in sodium chloride 0.9 % 100 mL IVPB     3 g 200 mL/hr over 30 Minutes Intravenous  Once 10/06/18 2054 10/06/18 2302      Medications   Scheduled Meds: . heparin  5,000 Units Subcutaneous Q8H  . labetalol  300 mg Oral BID   Continuous Infusions: . sodium chloride 75 mL/hr at 10/07/18 1424  . ciprofloxacin 400 mg (10/07/18 2142)  . metronidazole 500 mg (10/08/18 0538)   PRN Meds:.docusate sodium, hydrALAZINE, HYDROcodone-acetaminophen, HYDROmorphone  (DILAUDID) injection, ondansetron (ZOFRAN) IV   Data Review:   Micro Results No results found for this or any previous visit (from the past 240 hour(s)).  Radiology Reports Ct Abdomen Pelvis W Contrast  Result Date: 10/06/2018 CLINICAL DATA:  Progressing abdominal pain since yesterday. History of diverticulitis. EXAM: CT ABDOMEN AND PELVIS WITH CONTRAST TECHNIQUE: Multidetector CT imaging of the abdomen and pelvis was performed using the standard protocol following bolus administration of intravenous contrast. CONTRAST:  OMNIPAQUE IOHEXOL 300 MG/ML  SOLN COMPARISON:  12/05/2016 FINDINGS: Lower chest: Mild cardiomegaly without pericardial effusion. Bibasilar dependent atelectasis. No effusion or pneumothorax. No pulmonary consolidation or dominant mass. Hepatobiliary: Faint vascular blush in the right hepatic lobe which becomes isodense to adjacent liver on repeat imaging likely representing transient contrast filling of this segment of liver versus small hemangioma. No biliary dilatation. Biliary sludge is identified within the gallbladder as before without secondary signs of acute cholecystitis. Pancreas: Normal Spleen: Normal Adrenals/Urinary Tract: Stable left renal cysts, measuring 11 mm in the upper pole and 13 mm in the interpolar aspect. No enhancing renal mass. No uropathy. The urinary bladder is unremarkable for the degree of distention. Normal bilateral adrenal glands. Stomach/Bowel: Acute uncomplicated diverticulitis of the distal descending colon with mild pericolonic inflammatory change but no abscess or bowel obstruction is seen. Stomach, small intestine and remainder of large bowel are unremarkable. Normal appearing appendix is visualized. Vascular/Lymphatic: No significant vascular findings are present. No enlarged abdominal or pelvic lymph nodes. Reproductive: Mild prostatomegaly impressing upon the base of the bladder. Mild fatty induration about prostate can not exclude the  possibility of inflammatory change/prostatitis. Other: No intraperitoneal free air nor free fluid. Musculoskeletal: Mild lower lumbar facet arthropathy. Small fat containing umbilical hernia. IMPRESSION: 1. Acute uncomplicated diverticulitis of the distal descending colon. 2. Stable left renal cysts. 3. Faint vascular blush in the right hepatic lobe which becomes isodense to adjacent liver on repeat imaging likely to represent transient contrast filling versus small hemangioma. 4. Mild prostatomegaly impressing upon the base of the bladder. Mild fatty induration about the prostate may represent stigmata of prostatitis. Electronically Signed  By: Tollie Eth M.D.   On: 10/06/2018 20:44     CBC Recent Labs  Lab 10/06/18 1727 10/07/18 0407  WBC 8.0 9.3  HGB 15.9 15.9  HCT 48.9 48.9  PLT 274 261  MCV 81.2 82.7  MCH 26.4 26.9  MCHC 32.5 32.5  RDW 15.5 15.3    Chemistries  Recent Labs  Lab 10/06/18 1727 10/07/18 0407  NA 140 140  K 4.4 3.8  CL 107 106  CO2 25 26  GLUCOSE 126* 117*  BUN 17 13  CREATININE 1.37* 1.21  CALCIUM 9.6 9.1  AST 24  --   ALT 14  --   ALKPHOS 58  --   BILITOT 0.7  --    ------------------------------------------------------------------------------------------------------------------ estimated creatinine clearance is 94.9 mL/min (by C-G formula based on SCr of 1.21 mg/dL). ------------------------------------------------------------------------------------------------------------------ No results for input(s): HGBA1C in the last 72 hours. ------------------------------------------------------------------------------------------------------------------ No results for input(s): CHOL, HDL, LDLCALC, TRIG, CHOLHDL, LDLDIRECT in the last 72 hours. ------------------------------------------------------------------------------------------------------------------ No results for input(s): TSH, T4TOTAL, T3FREE, THYROIDAB in the last 72 hours.  Invalid input(s):  FREET3 ------------------------------------------------------------------------------------------------------------------ No results for input(s): VITAMINB12, FOLATE, FERRITIN, TIBC, IRON, RETICCTPCT in the last 72 hours.  Coagulation profile No results for input(s): INR, PROTIME in the last 168 hours.  No results for input(s): DDIMER in the last 72 hours.  Cardiac Enzymes No results for input(s): CKMB, TROPONINI, MYOGLOBIN in the last 168 hours.  Invalid input(s): CK ------------------------------------------------------------------------------------------------------------------ Invalid input(s): POCBNP    Assessment & Plan   *Severe abdominal pain we will keep n.p.o. Obtain a stat abdominal x-ray rule out a perforation I have notified surgery of a consult Check a lactic acid level Obtain a CT scan of the abdomen if x-ray is negative  *Acute diverticulitis Continue IV Cipro and Flagyl. Continue IV fluids  Start full liquid diet  *Hypertension Pressure still elevated will start labetalol   *Acute renal insufficiency Resolved with IV fluid  *Active smoking Counseled to quit smoking by admitting physician      Code Status Orders  (From admission, onward)         Start     Ordered   10/06/18 2346  Full code  Continuous     10/06/18 2345        Code Status History    Date Active Date Inactive Code Status Order ID Comments User Context   04/18/2016 1943 04/19/2016 0809 Full Code [id number]  Enedina Finner, MD Inpatient   05/31/2015 0400 06/01/2015 0034 Full Code [id number]  Arnaldo Natal, MD Inpatient           Consults none  DVT Prophylaxis  Lovenox   Lab Results  Component Value Date   PLT 261 10/07/2018     Time Spent in minutes 45 minutes critical care time spent.   Auburn Bilberry M.D on 10/08/2018 at 10:31 AM  Between 7am to 6pm - Pager - [phone]  After 6pm go to [email or web address] - Social research officer, government  [employer]   Office   [phone]

## 2018-10-09 LAB — CBC
HCT: 43.4 % (ref 39.0–52.0)
Hemoglobin: 13.9 g/dL (ref 13.0–17.0)
MCH: 26.4 pg (ref 26.0–34.0)
MCHC: 32 g/dL (ref 30.0–36.0)
MCV: 82.4 fL (ref 80.0–100.0)
Platelets: 215 10*3/uL (ref 150–400)
RBC: 5.27 MIL/uL (ref 4.22–5.81)
RDW: 15.4 % (ref 11.5–15.5)
WBC: 14.9 10*3/uL — ABNORMAL HIGH (ref 4.0–10.5)
nRBC: 0 % (ref 0.0–0.2)

## 2018-10-09 LAB — BASIC METABOLIC PANEL
Anion gap: 8 (ref 5–15)
BUN: 16 mg/dL (ref 6–20)
CO2: 24 mmol/L (ref 22–32)
Calcium: 8.5 mg/dL — ABNORMAL LOW (ref 8.9–10.3)
Chloride: 107 mmol/L (ref 98–111)
Creatinine, Ser: 1.38 mg/dL — ABNORMAL HIGH (ref 0.61–1.24)
GFR calc Af Amer: >60 mL/min (ref >60)
GFR calc non Af Amer: >60 mL/min (ref >60)
Glucose, Bld: 109 mg/dL — ABNORMAL HIGH (ref 70–99)
POTASSIUM: 3.9 mmol/L (ref 3.5–5.1)
Sodium: 139 mmol/L (ref 135–145)

## 2018-10-09 MED ORDER — LABETALOL HCL 5 MG/ML IV SOLN
10.0000 mg | INTRAVENOUS | Status: DC | PRN
  Administered 2018-10-09 – 2018-10-12 (×5): 10 mg via INTRAVENOUS
  Filled 2018-10-09 (×5): qty 4

## 2018-10-09 MED ORDER — KCL IN DEXTROSE-NACL 40-5-0.45 MEQ/L-%-% IV SOLN
INTRAVENOUS | Status: DC
  Administered 2018-10-09 – 2018-10-10 (×3): via INTRAVENOUS
  Filled 2018-10-09 (×4): qty 1000

## 2018-10-09 NOTE — Progress Notes (Signed)
Subjective:  CC:  Brad [name] is a 43 y.o. male  Hospital stay day 3,   diverticulitis  HPI: Worsening diverticulitis on CT, with increased wbc today.  Pt states pain is better than yesterday.  ROS:  General: Denies weight loss, weight gain, fatigue, fevers, chills, and night sweats. Heart: Denies chest pain, palpitations, racing heart, irregular heartbeat, leg pain or swelling, and decreased activity tolerance. Respiratory: Denies breathing difficulty, shortness of breath, wheezing, cough, and sputum. GI: Denies change in appetite, heartburn, nausea, vomiting, constipation, diarrhea, and blood in stool. GU: Denies difficulty urinating, pain with urinating, urgency, frequency, blood in urine.   Objective:      Temp:  [98.2 F (36.8 C)-99.7 F (37.6 C)] 98.2 F (36.8 C) (02/15 0847) Pulse Rate:  [79-103] 79 (02/15 0847) Resp:  [18-22] 22 (02/15 0847) BP: (159-179)/(81-116) 169/116 (02/15 0847) SpO2:  [95 %-96 %] 96 % (02/15 0847)     Height: 5\' 5"  (165.1 cm) Weight: 118.7 kg BMI (Calculated): 43.55   Intake/Output this shift:   Intake/Output Summary (Last 24 hours) at 10/09/2018 1015 Last data filed at 10/09/2018 0700 Gross per 24 hour  Intake 3339.91 ml  Output -  Net 3339.91 ml        Constitutional :  alert, cooperative, appears stated age and no distress  Respiratory:  clear to auscultation bilaterally  Cardiovascular:  regular rate and rhythm  Gastrointestinal: soft, less generalized tenderness than yesterday, but still has significant focal tenderness in LLQ.   Skin: Cool and moist.   Psychiatric: Normal affect, non-agitated, not confused       LABS:  CMP Latest Ref Rng & Units 10/09/2018 10/08/2018 10/07/2018  Glucose 70 - 99 mg/dL 161(W) 960(A) 540(J)  BUN 6 - 20 mg/dL 16 15 13   Creatinine 0.61 - 1.24 mg/dL 8.11(B) 1.47(W) 2.95  Sodium 135 - 145 mmol/L 139 138 140  Potassium 3.5 - 5.1 mmol/L 3.9 3.4(L) 3.8  Chloride 98 - 111 mmol/L 107 106 106  CO2 22  - 32 mmol/L 24 22 26   Calcium 8.9 - 10.3 mg/dL 6.2(Z) 3.0(Q) 9.1  Total Protein 6.5 - 8.1 g/dL - 7.1 -  Total Bilirubin 0.3 - 1.2 mg/dL - 0.9 -  Alkaline Phos 38 - 126 U/L - 44 -  AST 15 - 41 U/L - 16 -  ALT 0 - 44 U/L - 11 -   CBC Latest Ref Rng & Units 10/09/2018 10/08/2018 10/07/2018  WBC 4.0 - 10.5 K/uL 14.9(H) 11.4(H) 9.3  Hemoglobin 13.0 - 17.0 g/dL 65.7 84.6 96.2  Hematocrit 39.0 - 52.0 % 43.4 46.4 48.9  Platelets 150 - 400 K/uL 215 225 261    RADS: CLINICAL DATA:  Persistent left lower quadrant pain and nausea. Diverticulitis.  EXAM: CT ABDOMEN AND PELVIS WITH CONTRAST  TECHNIQUE: Multidetector CT imaging of the abdomen and pelvis was performed using the standard protocol following bolus administration of intravenous contrast.  CONTRAST:  OMNIPAQUE IOHEXOL 300 MG/ML  SOLN  COMPARISON:  10/06/2018  FINDINGS: Lower Chest: No acute findings.  Hepatobiliary: No hepatic masses identified. Gallbladder is unremarkable.  Pancreas:  No mass or inflammatory changes.  Spleen: Within normal limits in size and appearance.  Adrenals/Urinary Tract: No masses identified. A few tiny sub-cm left renal cysts are stable. No evidence of hydronephrosis. Unremarkable unopacified urinary bladder.  Stomach/Bowel: Moderate diverticulitis is seen involving the distal descending and proximal sigmoid colon, with increased colonic wall thickening and pericolonic inflammatory changes since previous study. No evidence of extraluminal  air or abscess. No evidence of bowel obstruction.  Vascular/Lymphatic: No pathologically enlarged lymph nodes. No abdominal aortic aneurysm. Aortic atherosclerosis.  Reproductive:  No mass or other significant abnormality.  Other:  None.  Musculoskeletal:  No suspicious bone lesions identified.  IMPRESSION: Increased moderate diverticulitis involving the distal descending and proximal sigmoid colon compared to previous study. No  evidence of abscess or other complication.   Electronically Signed   By: Myles Rosenthal M.D.   On: 10/08/2018 14:13 Assessment:   Diverticulitis-worsening despite initial abx treatment.  Escalated to meropenem and will continue to monitor.  Exam still only has focal peritonitis and no evidence of obvious free air or abscess formation on CT, so no need for additional intervention.  Switched back to NPO with ice chips to minimize colon stimulation during this acute inflammatory phase.  This is patient's third episode and he already failed first line abx, so he is high risk of possible surgical intervention.  Briefly discussed possibility of hartman's procedure with patient.  Recommend not advancing diet until pain is almost completely resolved and wbc normalized.  Surgery will continue to monitor

## 2018-10-09 NOTE — Progress Notes (Signed)
[employer] - [place] [facility]                                                                                                                                                                                  Patient Demographics   Brad PolkaFrank [name], is a 43 y.o. male, DOB - 1975/10/01, ZOX:[id number]  Admit date - 10/06/2018   Admitting Physician Altamese DillingVaibhavkumar Vachhani, MD  Outpatient Primary MD for the patient is Patient, No Pcp Per   LOS - 3  Subjective: Patient states he is feeling much better this morning.  He is still having some abdominal pain in his left lower quadrant, but this is much improved from yesterday.  No more episodes of diaphoresis.  Review of Systems:   CONSTITUTIONAL: No fevers or diaphoresis EYES: No blurry or double vision.  ENT: No tinnitus. No postnasal drip. No redness of the oropharynx.  RESPIRATORY: No cough, no wheeze, no hemoptysis. No dyspnea.  CARDIOVASCULAR: No chest pain. No orthopnea. No palpitations. No syncope.  GASTROINTESTINAL: No nausea, no vomiting or diarrhea.  Positive abdominal pain. No melena or hematochezia.  GENITOURINARY: No dysuria or hematuria.  ENDOCRINE: No polyuria or nocturia. No heat or cold intolerance.  HEMATOLOGY: No anemia. No bruising. No bleeding.  INTEGUMENTARY: No rashes. No lesions.  MUSCULOSKELETAL: No arthritis. No swelling. No gout.  NEUROLOGIC: No numbness, tingling, or ataxia. No seizure-type activity.  PSYCHIATRIC: No anxiety. No insomnia. No ADD.    Vitals:   Vitals:   10/08/18 1722 10/08/18 2111 10/09/18 0232 10/09/18 0847  BP: (!) 179/114 (!) 166/106 (!) 159/81 (!) 169/116  Pulse: (!) 103 96 83 79  Resp: 20  18 (!) 22  Temp: 99.3 F (37.4 C)  99.7 F (37.6 C) 98.2 F (36.8 C)  TempSrc: Oral  Axillary Oral  SpO2: 95%  96% 96%  Weight:      Height:        Wt Readings from Last 3 Encounters:  10/06/18 118.7 kg  12/05/16 116.1 kg  10/17/16 111.1 kg     Intake/Output Summary  (Last 24 hours) at 10/09/2018 1222 Last data filed at 10/09/2018 0700 Gross per 24 hour  Intake 3339.91 ml  Output -  Net 3339.91 ml    Physical Exam:   GENERAL: Laying in bed in no acute distress HEENT: Atraumatic, normocephalic. Extraocular muscles are intact. Pupils equal and reactive to light. Sclerae anicteric. No conjunctival injection. No oro-pharyngeal erythema.  Moist mucous membranes. NECK: Supple. There is no jugular venous distention. No bruits, no lymphadenopathy, no thyromegaly.  HEART: Regular rate and rhythm,. No murmurs, no rubs, no clicks.  LUNGS: Clear to auscultation bilaterally. No rales or rhonchi.  No wheezes.  ABDOMEN: + LLQ abdominal tenderness to palpation.  No rebound or guarding. EXTREMITIES: No evidence of any cyanosis, clubbing, or peripheral edema.  +2 pedal and radial pulses bilaterally.  NEUROLOGIC: The patient is alert, awake, and oriented x3 with no focal motor or sensory deficits appreciated bilaterally.  SKIN: Moist and warm with no rashes appreciated.  Psych: Not anxious, depressed  Antibiotics   Anti-infectives (From admission, onward)   Start     Dose/Rate Route Frequency Ordered Stop   10/08/18 2030  meropenem (MERREM) 1 g in sodium chloride 0.9 % 100 mL IVPB     1 g 200 mL/hr over 30 Minutes Intravenous Every 8 hours 10/08/18 2018     10/06/18 2230  metroNIDAZOLE (FLAGYL) IVPB 500 mg  Status:  Discontinued     500 mg 100 mL/hr over 60 Minutes Intravenous Every 8 hours 10/06/18 2215 10/08/18 2011   10/06/18 2215  ciprofloxacin (CIPRO) IVPB 400 mg  Status:  Discontinued     400 mg 200 mL/hr over 60 Minutes Intravenous Every 12 hours 10/06/18 2215 10/08/18 2011   10/06/18 2100  Ampicillin-Sulbactam (UNASYN) 3 g in sodium chloride 0.9 % 100 mL IVPB     3 g 200 mL/hr over 30 Minutes Intravenous  Once 10/06/18 2054 10/06/18 2302      Medications   Scheduled Meds: . heparin  5,000 Units Subcutaneous Q8H  . labetalol  300 mg Oral BID    Continuous Infusions: . dextrose 5 % and 0.45 % NaCl with KCl 40 mEq/L 75 mL/hr at 10/09/18 1046  . meropenem (MERREM) IV Stopped (10/09/18 0534)   PRN Meds:.docusate sodium, hydrALAZINE, HYDROcodone-acetaminophen, HYDROmorphone (DILAUDID) injection, ondansetron (ZOFRAN) IV   Data Review:   Micro Results No results found for this or any previous visit (from the past 240 hour(s)).  Radiology Reports Dg Abd 1 View  Result Date: 10/08/2018 CLINICAL DATA:  43 year old male with history of abdominal pain. EXAM: ABDOMEN - 1 VIEW COMPARISON:  Acute abdominal series 04/18/2016. CT the abdomen and pelvis 10/06/2018. FINDINGS: Several prominent but nondilated loops of gas-filled small bowel are noted in the central abdomen and left upper quadrant measuring up to 2.8 cm in diameter. There is some gas and stool noted in the colon, including retained contrast material in the region of the hepatic flexure. There is paucity of distal colonic gas and stool. No pathologic dilatation of small bowel or colon. No pneumoperitoneum. IMPRESSION: 1. Nonspecific, nonobstructive bowel gas pattern, as above. Given the findings on the prior CT examination, this may reflect a mild ileus. 2. No pneumoperitoneum. Electronically Signed   By: Trudie Reed M.D.   On: 10/08/2018 10:50   Ct Abdomen Pelvis W Contrast  Result Date: 10/08/2018 CLINICAL DATA:  Persistent left lower quadrant pain and nausea. Diverticulitis. EXAM: CT ABDOMEN AND PELVIS WITH CONTRAST TECHNIQUE: Multidetector CT imaging of the abdomen and pelvis was performed using the standard protocol following bolus administration of intravenous contrast. CONTRAST:  OMNIPAQUE IOHEXOL 300 MG/ML  SOLN COMPARISON:  10/06/2018 FINDINGS: Lower Chest: No acute findings. Hepatobiliary: No hepatic masses identified. Gallbladder is unremarkable. Pancreas:  No mass or inflammatory changes. Spleen: Within normal limits in size and appearance. Adrenals/Urinary Tract:  No masses identified. A few tiny sub-cm left renal cysts are stable. No evidence of hydronephrosis. Unremarkable unopacified urinary bladder. Stomach/Bowel: Moderate diverticulitis is seen involving the distal descending and proximal sigmoid colon, with increased colonic wall thickening and pericolonic inflammatory changes since previous study. No evidence of extraluminal  air or abscess. No evidence of bowel obstruction. Vascular/Lymphatic: No pathologically enlarged lymph nodes. No abdominal aortic aneurysm. Aortic atherosclerosis. Reproductive:  No mass or other significant abnormality. Other:  None. Musculoskeletal:  No suspicious bone lesions identified. IMPRESSION: Increased moderate diverticulitis involving the distal descending and proximal sigmoid colon compared to previous study. No evidence of abscess or other complication. Electronically Signed   By: Myles Rosenthal M.D.   On: 10/08/2018 14:13   Ct Abdomen Pelvis W Contrast  Result Date: 10/06/2018 CLINICAL DATA:  Progressing abdominal pain since yesterday. History of diverticulitis. EXAM: CT ABDOMEN AND PELVIS WITH CONTRAST TECHNIQUE: Multidetector CT imaging of the abdomen and pelvis was performed using the standard protocol following bolus administration of intravenous contrast. CONTRAST:  OMNIPAQUE IOHEXOL 300 MG/ML  SOLN COMPARISON:  12/05/2016 FINDINGS: Lower chest: Mild cardiomegaly without pericardial effusion. Bibasilar dependent atelectasis. No effusion or pneumothorax. No pulmonary consolidation or dominant mass. Hepatobiliary: Faint vascular blush in the right hepatic lobe which becomes isodense to adjacent liver on repeat imaging likely representing transient contrast filling of this segment of liver versus small hemangioma. No biliary dilatation. Biliary sludge is identified within the gallbladder as before without secondary signs of acute cholecystitis. Pancreas: Normal Spleen: Normal Adrenals/Urinary Tract: Stable left renal cysts,  measuring 11 mm in the upper pole and 13 mm in the interpolar aspect. No enhancing renal mass. No uropathy. The urinary bladder is unremarkable for the degree of distention. Normal bilateral adrenal glands. Stomach/Bowel: Acute uncomplicated diverticulitis of the distal descending colon with mild pericolonic inflammatory change but no abscess or bowel obstruction is seen. Stomach, small intestine and remainder of large bowel are unremarkable. Normal appearing appendix is visualized. Vascular/Lymphatic: No significant vascular findings are present. No enlarged abdominal or pelvic lymph nodes. Reproductive: Mild prostatomegaly impressing upon the base of the bladder. Mild fatty induration about prostate can not exclude the possibility of inflammatory change/prostatitis. Other: No intraperitoneal free air nor free fluid. Musculoskeletal: Mild lower lumbar facet arthropathy. Small fat containing umbilical hernia. IMPRESSION: 1. Acute uncomplicated diverticulitis of the distal descending colon. 2. Stable left renal cysts. 3. Faint vascular blush in the right hepatic lobe which becomes isodense to adjacent liver on repeat imaging likely to represent transient contrast filling versus small hemangioma. 4. Mild prostatomegaly impressing upon the base of the bladder. Mild fatty induration about the prostate may represent stigmata of prostatitis. Electronically Signed   By: Tollie Eth M.D.   On: 10/06/2018 20:44     CBC Recent Labs  Lab 10/06/18 1727 10/07/18 0407 10/08/18 1149 10/09/18 0620  WBC 8.0 9.3 11.4* 14.9*  HGB 15.9 15.9 15.0 13.9  HCT 48.9 48.9 46.4 43.4  PLT 274 261 225 215  MCV 81.2 82.7 82.9 82.4  MCH 26.4 26.9 26.8 26.4  MCHC 32.5 32.5 32.3 32.0  RDW 15.5 15.3 15.5 15.4    Chemistries  Recent Labs  Lab 10/06/18 1727 10/07/18 0407 10/08/18 1013 10/09/18 0620  NA 140 140 138 139  K 4.4 3.8 3.4* 3.9  CL 107 106 106 107  CO2 25 26 22 24   GLUCOSE 126* 117* 159* 109*  BUN 17 13 15 16    CREATININE 1.37* 1.21 1.41* 1.38*  CALCIUM 9.6 9.1 8.6* 8.5*  AST 24  --  16  --   ALT 14  --  11  --   ALKPHOS 58  --  44  --   BILITOT 0.7  --  0.9  --    ------------------------------------------------------------------------------------------------------------------ estimated creatinine clearance is  83.2 mL/min (A) (by C-G formula based on SCr of 1.38 mg/dL (H)). ------------------------------------------------------------------------------------------------------------------ No results for input(s): HGBA1C in the last 72 hours. ------------------------------------------------------------------------------------------------------------------ No results for input(s): CHOL, HDL, LDLCALC, TRIG, CHOLHDL, LDLDIRECT in the last 72 hours. ------------------------------------------------------------------------------------------------------------------ No results for input(s): TSH, T4TOTAL, T3FREE, THYROIDAB in the last 72 hours.  Invalid input(s): FREET3 ------------------------------------------------------------------------------------------------------------------ No results for input(s): VITAMINB12, FOLATE, FERRITIN, TIBC, IRON, RETICCTPCT in the last 72 hours.  Coagulation profile No results for input(s): INR, PROTIME in the last 168 hours.  No results for input(s): DDIMER in the last 72 hours.  Cardiac Enzymes No results for input(s): CKMB, TROPONINI, MYOGLOBIN in the last 168 hours.  Invalid input(s): CK ------------------------------------------------------------------------------------------------------------------ Invalid input(s): POCBNP    Assessment & Plan   Acute diverticulitis- pain has improved from last night, but worsening leukocytosis today.  -Repeat CT abdomen/pelvis 2/14 with increased moderate diverticulitis in the distal descending and proximal sigmoid colon, no evidence of abscess or perforation. -Changed from IV cipro/flagyl to meropenem 2/14 -Surgery  following- recommend keeping patient n.p.o. until pain is almost completely resolved and leukocytosis improves -Continue IV fluids -Pain control  Hypertension- BPs remain high -IV hydralazine and labetalol as needed while patient is n.p.o.  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 -Avoid nephrotoxic agents -Continue maintenance IV fluids  Tobacco use -Tobacco cessation counseling performed this hospitalization      Code Status Orders  (From admission, onward)         Start     Ordered   10/06/18 2346  Full code  Continuous     10/06/18 2345        Code Status History    Date Active Date Inactive Code Status Order ID Comments User Context   04/18/2016 1943 04/19/2016 0809 Full Code [id number]  Enedina FinnerPatel, Sona, MD Inpatient   05/31/2015 0400 06/01/2015 0034 Full Code [id number]  Arnaldo Nataliamond, Michael S, MD Inpatient      Consults none  DVT Prophylaxis  Lovenox   Lab Results  Component Value Date   PLT 215 10/09/2018     Time Spent in minutes 40 minutes   Hilton SinclairKaty D Mayo M.D on 10/09/2018 at 12:22 PM  Between 7am to 6pm - Pager - [phone]  After 6pm go to [email or web address] - Social research officer, governmentpassword [email or web address]  [employer]   Office  [phone]

## 2018-10-09 NOTE — Plan of Care (Signed)

## 2018-10-10 LAB — CBC
HCT: 44.8 % (ref 39.0–52.0)
HEMOGLOBIN: 14.3 g/dL (ref 13.0–17.0)
MCH: 26.4 pg (ref 26.0–34.0)
MCHC: 31.9 g/dL (ref 30.0–36.0)
MCV: 82.8 fL (ref 80.0–100.0)
Platelets: 228 10*3/uL (ref 150–400)
RBC: 5.41 MIL/uL (ref 4.22–5.81)
RDW: 15.3 % (ref 11.5–15.5)
WBC: 12.8 10*3/uL — ABNORMAL HIGH (ref 4.0–10.5)
nRBC: 0 % (ref 0.0–0.2)

## 2018-10-10 MED ORDER — DOCUSATE SODIUM 100 MG PO CAPS
100.0000 mg | ORAL_CAPSULE | Freq: Two times a day (BID) | ORAL | Status: DC
  Administered 2018-10-10: 100 mg via ORAL
  Filled 2018-10-10: qty 1

## 2018-10-10 MED ORDER — HYDRALAZINE HCL 20 MG/ML IJ SOLN
10.0000 mg | Freq: Once | INTRAMUSCULAR | Status: AC
  Administered 2018-10-10: 10 mg via INTRAVENOUS
  Filled 2018-10-10: qty 1

## 2018-10-10 MED ORDER — METOPROLOL TARTRATE 5 MG/5ML IV SOLN
5.0000 mg | Freq: Once | INTRAVENOUS | Status: DC
  Filled 2018-10-10 (×2): qty 5

## 2018-10-10 MED ORDER — AMLODIPINE BESYLATE 10 MG PO TABS
10.0000 mg | ORAL_TABLET | Freq: Every day | ORAL | Status: DC
  Administered 2018-10-11 – 2018-10-12 (×2): 10 mg via ORAL
  Filled 2018-10-10 (×2): qty 1

## 2018-10-10 NOTE — Progress Notes (Signed)
[employer] - [place] at [facility]                                                                                                                                                                                  Patient Demographics   Brad [name], is a 43 y.o. male, DOB - [birth year], LKJ:[id number]  Admit date - 10/06/2018   Admitting Physician Altamese Dilling, MD  Outpatient Primary MD for the patient is Patient, No Pcp Per   LOS - 4  Subjective: Being better today.  States that his abdominal pain has improved.  White blood cell count coming down.  Denies any nausea or vomiting.  Review of Systems:   CONSTITUTIONAL: No fevers or diaphoresis EYES: No blurry or double vision.  ENT: No tinnitus. No postnasal drip. No redness of the oropharynx.  RESPIRATORY: No cough, no wheeze, no hemoptysis. No dyspnea.  CARDIOVASCULAR: No chest pain. No orthopnea. No palpitations. No syncope.  GASTROINTESTINAL: No nausea, no vomiting or diarrhea.  Positive LLQ abdominal pain. No melena or hematochezia.  GENITOURINARY: No dysuria or hematuria.  ENDOCRINE: No polyuria or nocturia. No heat or cold intolerance.  HEMATOLOGY: No anemia. No bruising. No bleeding.  INTEGUMENTARY: No rashes. No lesions.  MUSCULOSKELETAL: No arthritis. No swelling. No gout.  NEUROLOGIC: No numbness, tingling, or ataxia. No seizure-type activity.  PSYCHIATRIC: No anxiety. No insomnia. No ADD.    Vitals:   Vitals:   10/09/18 1829 10/09/18 1952 10/09/18 2301 10/10/18 0730  BP: (!) 170/123 (!) 178/98 (!) 164/92 (!) 193/106  Pulse: 76  91 85  Resp:   18 17  Temp:   98.7 F (37.1 C) 98 F (36.7 C)  TempSrc:   Oral Oral  SpO2:   93% 99%  Weight:      Height:        Wt Readings from Last 3 Encounters:  10/06/18 118.7 kg  12/05/16 116.1 kg  10/17/16 111.1 kg     Intake/Output Summary (Last 24 hours) at 10/10/2018 1319 Last data filed at 10/10/2018 1022 Gross per 24 hour  Intake 1891.78 ml   Output 950 ml  Net 941.78 ml    Physical Exam:   GENERAL: Laying in bed in no acute distress HEENT: Atraumatic, normocephalic. Extraocular muscles are intact. Pupils equal and reactive to light. Sclerae anicteric. No conjunctival injection. No oro-pharyngeal erythema.  Moist mucous membranes. NECK: Supple. There is no jugular venous distention. No bruits, no lymphadenopathy, no thyromegaly.  HEART: Regular rate and rhythm,. No murmurs, no rubs, no clicks.  LUNGS: Clear to auscultation bilaterally. No rales or rhonchi. No wheezes.  ABDOMEN: +LLQ abdominal tenderness to palpation.  No rebound or guarding.  EXTREMITIES: No evidence of any cyanosis, clubbing, or peripheral edema.  +2 pedal and radial pulses bilaterally.  NEUROLOGIC: The patient is alert, awake, and oriented x3 with no focal motor or sensory deficits appreciated bilaterally.  SKIN: Moist and warm with no rashes appreciated.  Psych: Not anxious, depressed  Antibiotics   Anti-infectives (From admission, onward)   Start     Dose/Rate Route Frequency Ordered Stop   10/08/18 2030  meropenem (MERREM) 1 g in sodium chloride 0.9 % 100 mL IVPB     1 g 200 mL/hr over 30 Minutes Intravenous Every 8 hours 10/08/18 2018     10/06/18 2230  metroNIDAZOLE (FLAGYL) IVPB 500 mg  Status:  Discontinued     500 mg 100 mL/hr over 60 Minutes Intravenous Every 8 hours 10/06/18 2215 10/08/18 2011   10/06/18 2215  ciprofloxacin (CIPRO) IVPB 400 mg  Status:  Discontinued     400 mg 200 mL/hr over 60 Minutes Intravenous Every 12 hours 10/06/18 2215 10/08/18 2011   10/06/18 2100  Ampicillin-Sulbactam (UNASYN) 3 g in sodium chloride 0.9 % 100 mL IVPB     3 g 200 mL/hr over 30 Minutes Intravenous  Once 10/06/18 2054 10/06/18 2302      Medications   Scheduled Meds: . heparin  5,000 Units Subcutaneous Q8H   Continuous Infusions: . dextrose 5 % and 0.45 % NaCl with KCl 40 mEq/L 75 mL/hr at 10/10/18 1220  . meropenem (MERREM) IV 1 g (10/10/18  1221)   PRN Meds:.docusate sodium, hydrALAZINE, HYDROcodone-acetaminophen, HYDROmorphone (DILAUDID) injection, labetalol, ondansetron (ZOFRAN) IV   Data Review:   Micro Results No results found for this or any previous visit (from the past 240 hour(s)).  Radiology Reports Dg Abd 1 View  Result Date: 10/08/2018 CLINICAL DATA:  43 year old male with history of abdominal pain. EXAM: ABDOMEN - 1 VIEW COMPARISON:  Acute abdominal series 04/18/2016. CT the abdomen and pelvis 10/06/2018. FINDINGS: Several prominent but nondilated loops of gas-filled small bowel are noted in the central abdomen and left upper quadrant measuring up to 2.8 cm in diameter. There is some gas and stool noted in the colon, including retained contrast material in the region of the hepatic flexure. There is paucity of distal colonic gas and stool. No pathologic dilatation of small bowel or colon. No pneumoperitoneum. IMPRESSION: 1. Nonspecific, nonobstructive bowel gas pattern, as above. Given the findings on the prior CT examination, this may reflect a mild ileus. 2. No pneumoperitoneum. Electronically Signed   By: Trudie Reed M.D.   On: 10/08/2018 10:50   Ct Abdomen Pelvis W Contrast  Result Date: 10/08/2018 CLINICAL DATA:  Persistent left lower quadrant pain and nausea. Diverticulitis. EXAM: CT ABDOMEN AND PELVIS WITH CONTRAST TECHNIQUE: Multidetector CT imaging of the abdomen and pelvis was performed using the standard protocol following bolus administration of intravenous contrast. CONTRAST:  OMNIPAQUE IOHEXOL 300 MG/ML  SOLN COMPARISON:  10/06/2018 FINDINGS: Lower Chest: No acute findings. Hepatobiliary: No hepatic masses identified. Gallbladder is unremarkable. Pancreas:  No mass or inflammatory changes. Spleen: Within normal limits in size and appearance. Adrenals/Urinary Tract: No masses identified. A few tiny sub-cm left renal cysts are stable. No evidence of hydronephrosis. Unremarkable unopacified urinary  bladder. Stomach/Bowel: Moderate diverticulitis is seen involving the distal descending and proximal sigmoid colon, with increased colonic wall thickening and pericolonic inflammatory changes since previous study. No evidence of extraluminal air or abscess. No evidence of bowel obstruction. Vascular/Lymphatic: No pathologically enlarged lymph nodes. No abdominal aortic aneurysm. Aortic atherosclerosis. Reproductive:  No mass or other significant abnormality. Other:  None. Musculoskeletal:  No suspicious bone lesions identified. IMPRESSION: Increased moderate diverticulitis involving the distal descending and proximal sigmoid colon compared to previous study. No evidence of abscess or other complication. Electronically Signed   By: Myles RosenthalJohn  Stahl M.D.   On: 10/08/2018 14:13   Ct Abdomen Pelvis W Contrast  Result Date: 10/06/2018 CLINICAL DATA:  Progressing abdominal pain since yesterday. History of diverticulitis. EXAM: CT ABDOMEN AND PELVIS WITH CONTRAST TECHNIQUE: Multidetector CT imaging of the abdomen and pelvis was performed using the standard protocol following bolus administration of intravenous contrast. CONTRAST:  100mL OMNIPAQUE IOHEXOL 300 MG/ML  SOLN COMPARISON:  12/05/2016 FINDINGS: Lower chest: Mild cardiomegaly without pericardial effusion. Bibasilar dependent atelectasis. No effusion or pneumothorax. No pulmonary consolidation or dominant mass. Hepatobiliary: Faint vascular blush in the right hepatic lobe which becomes isodense to adjacent liver on repeat imaging likely representing transient contrast filling of this segment of liver versus small hemangioma. No biliary dilatation. Biliary sludge is identified within the gallbladder as before without secondary signs of acute cholecystitis. Pancreas: Normal Spleen: Normal Adrenals/Urinary Tract: Stable left renal cysts, measuring 11 mm in the upper pole and 13 mm in the interpolar aspect. No enhancing renal mass. No uropathy. The urinary bladder is  unremarkable for the degree of distention. Normal bilateral adrenal glands. Stomach/Bowel: Acute uncomplicated diverticulitis of the distal descending colon with mild pericolonic inflammatory change but no abscess or bowel obstruction is seen. Stomach, small intestine and remainder of large bowel are unremarkable. Normal appearing appendix is visualized. Vascular/Lymphatic: No significant vascular findings are present. No enlarged abdominal or pelvic lymph nodes. Reproductive: Mild prostatomegaly impressing upon the base of the bladder. Mild fatty induration about prostate can not exclude the possibility of inflammatory change/prostatitis. Other: No intraperitoneal free air nor free fluid. Musculoskeletal: Mild lower lumbar facet arthropathy. Small fat containing umbilical hernia. IMPRESSION: 1. Acute uncomplicated diverticulitis of the distal descending colon. 2. Stable left renal cysts. 3. Faint vascular blush in the right hepatic lobe which becomes isodense to adjacent liver on repeat imaging likely to represent transient contrast filling versus small hemangioma. 4. Mild prostatomegaly impressing upon the base of the bladder. Mild fatty induration about the prostate may represent stigmata of prostatitis. Electronically Signed   By: Tollie Ethavid  Kwon M.D.   On: 10/06/2018 20:44     CBC Recent Labs  Lab 10/06/18 1727 10/07/18 0407 10/08/18 1149 10/09/18 0620 10/10/18 0538  WBC 8.0 9.3 11.4* 14.9* 12.8*  HGB 15.9 15.9 15.0 13.9 14.3  HCT 48.9 48.9 46.4 43.4 44.8  PLT 274 261 225 215 228  MCV 81.2 82.7 82.9 82.4 82.8  MCH 26.4 26.9 26.8 26.4 26.4  MCHC 32.5 32.5 32.3 32.0 31.9  RDW 15.5 15.3 15.5 15.4 15.3    Chemistries  Recent Labs  Lab 10/06/18 1727 10/07/18 0407 10/08/18 1013 10/09/18 0620  NA 140 140 138 139  K 4.4 3.8 3.4* 3.9  CL 107 106 106 107  CO2 25 26 22 24   GLUCOSE 126* 117* 159* 109*  BUN 17 13 15 16   CREATININE 1.37* 1.21 1.41* 1.38*  CALCIUM 9.6 9.1 8.6* 8.5*  AST 24  --   16  --   ALT 14  --  11  --   ALKPHOS 58  --  44  --   BILITOT 0.7  --  0.9  --    ------------------------------------------------------------------------------------------------------------------ estimated creatinine clearance is 83.2 mL/min (A) (by C-G formula based on SCr of 1.38 mg/dL (  H)). ------------------------------------------------------------------------------------------------------------------ No results for input(s): HGBA1C in the last 72 hours. ------------------------------------------------------------------------------------------------------------------ No results for input(s): CHOL, HDL, LDLCALC, TRIG, CHOLHDL, LDLDIRECT in the last 72 hours. ------------------------------------------------------------------------------------------------------------------ No results for input(s): TSH, T4TOTAL, T3FREE, THYROIDAB in the last 72 hours.  Invalid input(s): FREET3 ------------------------------------------------------------------------------------------------------------------ No results for input(s): VITAMINB12, FOLATE, FERRITIN, TIBC, IRON, RETICCTPCT in the last 72 hours.  Coagulation profile No results for input(s): INR, PROTIME in the last 168 hours.  No results for input(s): DDIMER in the last 72 hours.  Cardiac Enzymes No results for input(s): CKMB, TROPONINI, MYOGLOBIN in the last 168 hours.  Invalid input(s): CK ------------------------------------------------------------------------------------------------------------------ Invalid input(s): POCBNP    Assessment & Plan   Acute diverticulitis- pain and leukocytosis improving -Repeat CT abdomen/pelvis 2/14 with increased moderate diverticulitis in the distal descending and proximal sigmoid colon, no evidence of abscess or perforation. 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 -Avoid nephrotoxic agents -Continue maintenance IV fluids  Tobacco use -Tobacco cessation counseling performed this hospitalization      Code Status Orders  (From admission, onward)         Start     Ordered   10/06/18 2346  Full code  Continuous     10/06/18 2345        Code Status History    Date Active Date Inactive Code Status Order ID Comments User Context   04/18/2016 1943 04/19/2016 0809 Full Code [id number]  Enedina Finner, MD Inpatient   05/31/2015 0400 06/01/2015 0034 Full Code [id number]  Arnaldo Natal, MD Inpatient      Consults none  DVT Prophylaxis  Lovenox   Lab Results  Component Value Date   PLT 228 10/10/2018     Time Spent in minutes 35 minutes   Hilton Sinclair M.D on 10/10/2018 at 1:19 PM  Between 7am to 6pm - Pager - [phone]  After 6pm go to [email or web address] - Social research officer, government  [employer]   Office  [phone]

## 2018-10-10 NOTE — Progress Notes (Signed)
Subjective:  CC:  Brad [name] is a 43 y.o. male  Hospital stay day 4,   diverticulitis  HPI: Pain continues to improve per patient report.  Wbc decreased as well.  ROS:  General: Denies weight loss, weight gain, fatigue, fevers, chills, and night sweats. Heart: Denies chest pain, palpitations, racing heart, irregular heartbeat, leg pain or swelling, and decreased activity tolerance. Respiratory: Denies breathing difficulty, shortness of breath, wheezing, cough, and sputum. GI: Denies change in appetite, heartburn, nausea, vomiting, constipation, diarrhea, and blood in stool. GU: Denies difficulty urinating, pain with urinating, urgency, frequency, blood in urine.   Objective:      Temp:  [98 F (36.7 C)-98.7 F (37.1 C)] 98 F (36.7 C) (02/16 0730) Pulse Rate:  [76-93] 85 (02/16 0730) Resp:  [17-18] 17 (02/16 0730) BP: (164-193)/(92-134) 193/106 (02/16 0730) SpO2:  [93 %-99 %] 99 % (02/16 0730)     Height: 5\' 5"  (165.1 cm) Weight: 118.7 kg BMI (Calculated): 43.55   Intake/Output this shift:   Intake/Output Summary (Last 24 hours) at 10/10/2018 0948 Last data filed at 10/10/2018 9574 Gross per 24 hour  Intake 1658.21 ml  Output 950 ml  Net 708.21 ml        Constitutional :  alert, cooperative, appears stated age and no distress  Respiratory:  clear to auscultation bilaterally  Cardiovascular:  regular rate and rhythm  Gastrointestinal: soft, only has focal tenderness in LLQ, improved since yesterday.   Skin: Cool and moist.   Psychiatric: Normal affect, non-agitated, not confused       LABS:  CMP Latest Ref Rng & Units 10/09/2018 10/08/2018 10/07/2018  Glucose 70 - 99 mg/dL 734(Y) 370(D) 643(C)  BUN 6 - 20 mg/dL 16 15 13   Creatinine 0.61 - 1.24 mg/dL 3.81(M) 4.03(F) 5.43  Sodium 135 - 145 mmol/L 139 138 140  Potassium 3.5 - 5.1 mmol/L 3.9 3.4(L) 3.8  Chloride 98 - 111 mmol/L 107 106 106  CO2 22 - 32 mmol/L 24 22 26   Calcium 8.9 - 10.3 mg/dL 6.0(O) 7.7(C) 9.1   Total Protein 6.5 - 8.1 g/dL - 7.1 -  Total Bilirubin 0.3 - 1.2 mg/dL - 0.9 -  Alkaline Phos 38 - 126 U/L - 44 -  AST 15 - 41 U/L - 16 -  ALT 0 - 44 U/L - 11 -   CBC Latest Ref Rng & Units 10/10/2018 10/09/2018 10/08/2018  WBC 4.0 - 10.5 K/uL 12.8(H) 14.9(H) 11.4(H)  Hemoglobin 13.0 - 17.0 g/dL 34.0 35.2 48.1  Hematocrit 39.0 - 52.0 % 44.8 43.4 46.4  Platelets 150 - 400 K/uL 228 215 225    RADS: n/a Assessment:   Diverticulitis-improving both clinically and lab wise.  Ok now to try clears again, with strict instructions to stop if pain worsens again.  He verbalized understanding.  Continue to monitor

## 2018-10-10 NOTE — Progress Notes (Signed)
Spoke with Dr. Esaw Grandchild, pt wanting something to help move his bowels. MD ordered colace 100mg  BID.

## 2018-10-10 NOTE — Plan of Care (Signed)
°  Problem: Health Behavior/Discharge Planning: °Goal: Ability to manage health-related needs will improve °Outcome: Progressing °  °Problem: Clinical Measurements: °Goal: Ability to maintain clinical measurements within normal limits will improve °Outcome: Progressing °Goal: Will remain free from infection °Outcome: Progressing °Goal: Diagnostic test results will improve °Outcome: Progressing °Goal: Cardiovascular complication will be avoided °Outcome: Progressing °  °Problem: Activity: °Goal: Risk for activity intolerance will decrease °Outcome: Progressing °  °Problem: Nutrition: °Goal: Adequate nutrition will be maintained °Outcome: Progressing °  °Problem: Coping: °Goal: Level of anxiety will decrease °Outcome: Progressing °  °Problem: Elimination: °Goal: Will not experience complications related to bowel motility °Outcome: Progressing °  °Problem: Pain Managment: °Goal: General experience of comfort will improve °Outcome: Progressing °  °Problem: Safety: °Goal: Ability to remain free from injury will improve °Outcome: Progressing °  °

## 2018-10-10 NOTE — Plan of Care (Signed)
°  Problem: Education: °Goal: Knowledge of General Education information will improve °Description: Including pain rating scale, medication(s)/side effects and non-pharmacologic comfort measures °Outcome: Progressing °  °Problem: Health Behavior/Discharge Planning: °Goal: Ability to manage health-related needs will improve °Outcome: Progressing °  °Problem: Clinical Measurements: °Goal: Ability to maintain clinical measurements within normal limits will improve °Outcome: Progressing °Goal: Will remain free from infection °Outcome: Progressing °Goal: Diagnostic test results will improve °Outcome: Progressing °Goal: Cardiovascular complication will be avoided °Outcome: Progressing °  °Problem: Activity: °Goal: Risk for activity intolerance will decrease °Outcome: Progressing °  °Problem: Coping: °Goal: Level of anxiety will decrease °Outcome: Progressing °  °Problem: Elimination: °Goal: Will not experience complications related to urinary retention °Outcome: Progressing °  °Problem: Pain Managment: °Goal: General experience of comfort will improve °Outcome: Progressing °  °Problem: Safety: °Goal: Ability to remain free from injury will improve °Outcome: Progressing °  °Problem: Skin Integrity: °Goal: Risk for impaired skin integrity will decrease °Outcome: Progressing °  °

## 2018-10-10 NOTE — Progress Notes (Signed)
Pt wanting dilaudid for a pain score of 2. Educated the pt that dilaudid can only be given for severe pain. Pt was offered another choice for pain medication and declined. Stated that he would let nurse know when pain got worse.

## 2018-10-10 NOTE — Progress Notes (Signed)
Spoke with Dr. Billey Gosling pt still with an elevated blood pressure after receiving 10mg  of Lobatelol

## 2018-10-11 LAB — BASIC METABOLIC PANEL
Anion gap: 10 (ref 5–15)
BUN: 13 mg/dL (ref 6–20)
CO2: 23 mmol/L (ref 22–32)
Calcium: 8.9 mg/dL (ref 8.9–10.3)
Chloride: 105 mmol/L (ref 98–111)
Creatinine, Ser: 1.23 mg/dL (ref 0.61–1.24)
GFR calc Af Amer: >60 mL/min (ref >60)
GFR calc non Af Amer: >60 mL/min (ref >60)
GLUCOSE: 108 mg/dL — AB (ref 70–99)
Potassium: 4 mmol/L (ref 3.5–5.1)
Sodium: 138 mmol/L (ref 135–145)

## 2018-10-11 LAB — CBC WITH DIFFERENTIAL/PLATELET
Abs Immature Granulocytes: 0.04 10*3/uL (ref 0.00–0.07)
Basophils Absolute: 0.1 10*3/uL (ref 0.0–0.1)
Basophils Relative: 1 %
Eosinophils Absolute: 0.4 10*3/uL (ref 0.0–0.5)
Eosinophils Relative: 5 %
HCT: 45.6 % (ref 39.0–52.0)
Hemoglobin: 14.7 g/dL (ref 13.0–17.0)
IMMATURE GRANULOCYTES: 0 %
Lymphocytes Relative: 19 %
Lymphs Abs: 1.8 10*3/uL (ref 0.7–4.0)
MCH: 26.4 pg (ref 26.0–34.0)
MCHC: 32.2 g/dL (ref 30.0–36.0)
MCV: 81.9 fL (ref 80.0–100.0)
Monocytes Absolute: 1.1 10*3/uL — ABNORMAL HIGH (ref 0.1–1.0)
Monocytes Relative: 12 %
Neutro Abs: 6 10*3/uL (ref 1.7–7.7)
Neutrophils Relative %: 63 %
Platelets: 252 10*3/uL (ref 150–400)
RBC: 5.57 MIL/uL (ref 4.22–5.81)
RDW: 15 % (ref 11.5–15.5)
WBC: 9.4 10*3/uL (ref 4.0–10.5)
nRBC: 0 % (ref 0.0–0.2)

## 2018-10-11 MED ORDER — PIPERACILLIN-TAZOBACTAM 3.375 G IVPB
3.3750 g | Freq: Three times a day (TID) | INTRAVENOUS | Status: DC
  Administered 2018-10-11 – 2018-10-12 (×3): 3.375 g via INTRAVENOUS
  Filled 2018-10-11 (×3): qty 50

## 2018-10-11 MED ORDER — LISINOPRIL 10 MG PO TABS
10.0000 mg | ORAL_TABLET | Freq: Every day | ORAL | Status: DC
  Administered 2018-10-11 – 2018-10-12 (×2): 10 mg via ORAL
  Filled 2018-10-11 (×2): qty 1

## 2018-10-11 NOTE — Progress Notes (Signed)
Pharmacy Antibiotic Note  Brad [name] is a 43 y.o. male admitted on 10/06/2018 with intra abdominal.  Pharmacy has been consulted for Meropenem to piperacillin/tazobactam dosing.   Day 6 total antibiotics - patient without h/o ESBL or recent antibiotic use (prior to admission) - no cultures  Plan: Zosyn 3.375gm IV q8h over 4h infusion - pharmacy will follow at distance - consider amoxicillin/clavulonate at discharge  Height: 5\' 5"  (165.1 cm) Weight: 261 lb 11 oz (118.7 kg) IBW/kg (Calculated) : 61.5  Temp (24hrs), Avg:98.4 F (36.9 C), Min:98 F (36.7 C), Max:98.8 F (37.1 C)  Recent Labs  Lab 10/06/18 1727 10/07/18 0407 10/08/18 1013 10/08/18 1149 10/09/18 0620 10/10/18 0538 10/11/18 0320  WBC 8.0 9.3  --  11.4* 14.9* 12.8* 9.4  CREATININE 1.37* 1.21 1.41*  --  1.38*  --  1.23  LATICACIDVEN  --   --  1.5  --   --   --   --     Estimated Creatinine Clearance: 93.4 mL/min (by C-G formula based on SCr of 1.23 mg/dL).    No Known Allergies   Thank you for allowing pharmacy to be a part of this patient's care.  Juliette Alcide, PharmD, BCPS.   Work Cell: [phone] 10/11/2018 12:15 PM

## 2018-10-11 NOTE — Progress Notes (Addendum)
[employer] - [facility] at [facility]                                                                                                                                                                                  Patient Demographics   Brad [name], is a 43 y.o. male, DOB - [birth year], BJY:[id number]  Admit date - 10/06/2018   Admitting Physician Altamese DillingVaibhavkumar Vachhani, MD  Outpatient Primary MD for the patient is Patient, No Pcp Per   LOS - 5  Subjective: Feeling much better today.  Able to tolerate a clear diet yesterday without any issues.  States that his abdominal pain is almost completely resolved.  Has not had a bowel movement.  Review of Systems:   CONSTITUTIONAL: No fevers or diaphoresis EYES: No blurry or double vision.  ENT: No tinnitus. No postnasal drip. No redness of the oropharynx.  RESPIRATORY: No cough, no wheeze, no hemoptysis. No dyspnea.  CARDIOVASCULAR: No chest pain. No orthopnea. No palpitations. No syncope.  GASTROINTESTINAL: No nausea, no vomiting or diarrhea.  +mild  abdominal pain. No melena or hematochezia.  GENITOURINARY: No dysuria or hematuria.  ENDOCRINE: No polyuria or nocturia. No heat or cold intolerance.  HEMATOLOGY: No anemia. No bruising. No bleeding.  INTEGUMENTARY: No rashes. No lesions.  MUSCULOSKELETAL: No arthritis. No swelling. No gout.  NEUROLOGIC: No numbness, tingling, or ataxia. No seizure-type activity.  PSYCHIATRIC: No anxiety. No insomnia. No ADD.    Vitals:   Vitals:   10/10/18 2105 10/10/18 2221 10/11/18 0511 10/11/18 0727  BP: (!) 182/98 (!) 158/98 (!) 148/100 (!) 181/114  Pulse:  80  78  Resp:  18  17  Temp:  98.8 F (37.1 C)  98 F (36.7 C)  TempSrc:    Oral  SpO2:  94%  94%  Weight:      Height:        Wt Readings from Last 3 Encounters:  10/06/18 118.7 kg  12/05/16 116.1 kg  10/17/16 111.1 kg     Intake/Output Summary (Last 24 hours) at 10/11/2018 1019 Last data filed at 10/11/2018  0900 Gross per 24 hour  Intake 900.89 ml  Output -  Net 900.89 ml    Physical Exam:   GENERAL: Laying in bed in no acute distress HEENT: Atraumatic, normocephalic. Extraocular muscles are intact. Pupils equal and reactive to light. Sclerae anicteric. No conjunctival injection. No oro-pharyngeal erythema.  Moist mucous membranes. NECK: Supple. There is no jugular venous distention. No bruits, no lymphadenopathy, no thyromegaly.  HEART: Regular rate and rhythm,. No murmurs, no rubs, no clicks.  LUNGS: Clear to auscultation bilaterally. No rales or rhonchi. No wheezes.  ABDOMEN: No tenderness to  palpation of the abdomen. No rebound or guarding. EXTREMITIES: No evidence of any cyanosis, clubbing, or peripheral edema.  +2 pedal and radial pulses bilaterally.  NEUROLOGIC: The patient is alert, awake, and oriented x3 with no focal motor or sensory deficits appreciated bilaterally.  SKIN: Moist and warm with no rashes appreciated.  Psych: Not anxious, depressed  Antibiotics   Anti-infectives (From admission, onward)   Start     Dose/Rate Route Frequency Ordered Stop   10/08/18 2030  meropenem (MERREM) 1 g in sodium chloride 0.9 % 100 mL IVPB     1 g 200 mL/hr over 30 Minutes Intravenous Every 8 hours 10/08/18 2018     10/06/18 2230  metroNIDAZOLE (FLAGYL) IVPB 500 mg  Status:  Discontinued     500 mg 100 mL/hr over 60 Minutes Intravenous Every 8 hours 10/06/18 2215 10/08/18 2011   10/06/18 2215  ciprofloxacin (CIPRO) IVPB 400 mg  Status:  Discontinued     400 mg 200 mL/hr over 60 Minutes Intravenous Every 12 hours 10/06/18 2215 10/08/18 2011   10/06/18 2100  Ampicillin-Sulbactam (UNASYN) 3 g in sodium chloride 0.9 % 100 mL IVPB     3 g 200 mL/hr over 30 Minutes Intravenous  Once 10/06/18 2054 10/06/18 2302      Medications   Scheduled Meds: . amLODipine  10 mg Oral Daily  . heparin  5,000 Units Subcutaneous Q8H  . metoprolol tartrate  5 mg Intravenous Once   Continuous  Infusions: . meropenem (MERREM) IV 1 g (10/11/18 0503)   PRN Meds:.docusate sodium, hydrALAZINE, HYDROcodone-acetaminophen, HYDROmorphone (DILAUDID) injection, labetalol, ondansetron (ZOFRAN) IV   Data Review:   Micro Results No results found for this or any previous visit (from the past 240 hour(s)).  Radiology Reports Dg Abd 1 View  Result Date: 10/08/2018 CLINICAL DATA:  43 year old male with history of abdominal pain. EXAM: ABDOMEN - 1 VIEW COMPARISON:  Acute abdominal series 04/18/2016. CT the abdomen and pelvis 10/06/2018. FINDINGS: Several prominent but nondilated loops of gas-filled small bowel are noted in the central abdomen and left upper quadrant measuring up to 2.8 cm in diameter. There is some gas and stool noted in the colon, including retained contrast material in the region of the hepatic flexure. There is paucity of distal colonic gas and stool. No pathologic dilatation of small bowel or colon. No pneumoperitoneum. IMPRESSION: 1. Nonspecific, nonobstructive bowel gas pattern, as above. Given the findings on the prior CT examination, this may reflect a mild ileus. 2. No pneumoperitoneum. Electronically Signed   By: Trudie Reed M.D.   On: 10/08/2018 10:50   Ct Abdomen Pelvis W Contrast  Result Date: 10/08/2018 CLINICAL DATA:  Persistent left lower quadrant pain and nausea. Diverticulitis. EXAM: CT ABDOMEN AND PELVIS WITH CONTRAST TECHNIQUE: Multidetector CT imaging of the abdomen and pelvis was performed using the standard protocol following bolus administration of intravenous contrast. CONTRAST:  OMNIPAQUE IOHEXOL 300 MG/ML  SOLN COMPARISON:  10/06/2018 FINDINGS: Lower Chest: No acute findings. Hepatobiliary: No hepatic masses identified. Gallbladder is unremarkable. Pancreas:  No mass or inflammatory changes. Spleen: Within normal limits in size and appearance. Adrenals/Urinary Tract: No masses identified. A few tiny sub-cm left renal cysts are stable. No evidence of  hydronephrosis. Unremarkable unopacified urinary bladder. Stomach/Bowel: Moderate diverticulitis is seen involving the distal descending and proximal sigmoid colon, with increased colonic wall thickening and pericolonic inflammatory changes since previous study. No evidence of extraluminal air or abscess. No evidence of bowel obstruction. Vascular/Lymphatic: No pathologically enlarged lymph nodes.  No abdominal aortic aneurysm. Aortic atherosclerosis. Reproductive:  No mass or other significant abnormality. Other:  None. Musculoskeletal:  No suspicious bone lesions identified. IMPRESSION: Increased moderate diverticulitis involving the distal descending and proximal sigmoid colon compared to previous study. No evidence of abscess or other complication. Electronically Signed   By: Myles Rosenthal M.D.   On: 10/08/2018 14:13   Ct Abdomen Pelvis W Contrast  Result Date: 10/06/2018 CLINICAL DATA:  Progressing abdominal pain since yesterday. History of diverticulitis. EXAM: CT ABDOMEN AND PELVIS WITH CONTRAST TECHNIQUE: Multidetector CT imaging of the abdomen and pelvis was performed using the standard protocol following bolus administration of intravenous contrast. CONTRAST:  OMNIPAQUE IOHEXOL 300 MG/ML  SOLN COMPARISON:  12/05/2016 FINDINGS: Lower chest: Mild cardiomegaly without pericardial effusion. Bibasilar dependent atelectasis. No effusion or pneumothorax. No pulmonary consolidation or dominant mass. Hepatobiliary: Faint vascular blush in the right hepatic lobe which becomes isodense to adjacent liver on repeat imaging likely representing transient contrast filling of this segment of liver versus small hemangioma. No biliary dilatation. Biliary sludge is identified within the gallbladder as before without secondary signs of acute cholecystitis. Pancreas: Normal Spleen: Normal Adrenals/Urinary Tract: Stable left renal cysts, measuring 11 mm in the upper pole and 13 mm in the interpolar aspect. No enhancing  renal mass. No uropathy. The urinary bladder is unremarkable for the degree of distention. Normal bilateral adrenal glands. Stomach/Bowel: Acute uncomplicated diverticulitis of the distal descending colon with mild pericolonic inflammatory change but no abscess or bowel obstruction is seen. Stomach, small intestine and remainder of large bowel are unremarkable. Normal appearing appendix is visualized. Vascular/Lymphatic: No significant vascular findings are present. No enlarged abdominal or pelvic lymph nodes. Reproductive: Mild prostatomegaly impressing upon the base of the bladder. Mild fatty induration about prostate can not exclude the possibility of inflammatory change/prostatitis. Other: No intraperitoneal free air nor free fluid. Musculoskeletal: Mild lower lumbar facet arthropathy. Small fat containing umbilical hernia. IMPRESSION: 1. Acute uncomplicated diverticulitis of the distal descending colon. 2. Stable left renal cysts. 3. Faint vascular blush in the right hepatic lobe which becomes isodense to adjacent liver on repeat imaging likely to represent transient contrast filling versus small hemangioma. 4. Mild prostatomegaly impressing upon the base of the bladder. Mild fatty induration about the prostate may represent stigmata of prostatitis. Electronically Signed   By: Tollie Eth M.D.   On: 10/06/2018 20:44     CBC Recent Labs  Lab 10/07/18 0407 10/08/18 1149 10/09/18 0620 10/10/18 0538 10/11/18 0320  WBC 9.3 11.4* 14.9* 12.8* 9.4  HGB 15.9 15.0 13.9 14.3 14.7  HCT 48.9 46.4 43.4 44.8 45.6  PLT 261 225 215 228 252  MCV 82.7 82.9 82.4 82.8 81.9  MCH 26.9 26.8 26.4 26.4 26.4  MCHC 32.5 32.3 32.0 31.9 32.2  RDW 15.3 15.5 15.4 15.3 15.0  LYMPHSABS  --   --   --   --  1.8  MONOABS  --   --   --   --  1.1*  EOSABS  --   --   --   --  0.4  BASOSABS  --   --   --   --  0.1    Chemistries  Recent Labs  Lab 10/06/18 1727 10/07/18 0407 10/08/18 1013 10/09/18 0620 10/11/18 0320  NA  140 140 138 139 138  K 4.4 3.8 3.4* 3.9 4.0  CL 107 106 106 107 105  CO2 25 26 22 24 23   GLUCOSE 126* 117* 159* 109* 108*  BUN  17 13 15 16 13   CREATININE 1.37* 1.21 1.41* 1.38* 1.23  CALCIUM 9.6 9.1 8.6* 8.5* 8.9  AST 24  --  16  --   --   ALT 14  --  11  --   --   ALKPHOS 58  --  44  --   --   BILITOT 0.7  --  0.9  --   --    ------------------------------------------------------------------------------------------------------------------ estimated creatinine clearance is 93.4 mL/min (by C-G formula based on SCr of 1.23 mg/dL). ------------------------------------------------------------------------------------------------------------------ No results for input(s): HGBA1C in the last 72 hours. ------------------------------------------------------------------------------------------------------------------ No results for input(s): CHOL, HDL, LDLCALC, TRIG, CHOLHDL, LDLDIRECT in the last 72 hours. ------------------------------------------------------------------------------------------------------------------ No results for input(s): TSH, T4TOTAL, T3FREE, THYROIDAB in the last 72 hours.  Invalid input(s): FREET3 ------------------------------------------------------------------------------------------------------------------ No results for input(s): VITAMINB12, FOLATE, FERRITIN, TIBC, IRON, RETICCTPCT in the last 72 hours.  Coagulation profile No results for input(s): INR, PROTIME in the last 168 hours.  No results for input(s): DDIMER in the last 72 hours.  Cardiac Enzymes No results for input(s): CKMB, TROPONINI, MYOGLOBIN in the last 168 hours.  Invalid input(s): CK ------------------------------------------------------------------------------------------------------------------ Invalid input(s): POCBNP    Assessment & Plan   Acute diverticulitis- pain and leukocytosis have resolved.  Tolerating clear diet. 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 -Will need to monitor after addition of ACE -Avoid nephrotoxic agents -Continue maintenance IV fluids  Tobacco use -Tobacco cessation counseling performed this hospitalization      Code Status Orders  (From admission, onward)         Start     Ordered   10/06/18 2346  Full code  Continuous     10/06/18 2345        Code Status History    Date Active Date Inactive Code Status Order ID Comments User Context   04/18/2016 1943 04/19/2016 0809 Full Code [id number]  Enedina Finner, MD Inpatient   05/31/2015 0400 06/01/2015 0034 Full Code [id number]  Arnaldo Natal, MD Inpatient      Consults none  DVT Prophylaxis  Lovenox   Lab Results  Component Value Date   PLT 252 10/11/2018     Time Spent in minutes 35 minutes   Hilton Sinclair M.D on 10/11/2018 at 10:19 AM  Between 7am to 6pm - Pager - [phone]  After 6pm go to [email or web address] - Social research officer, government  [employer]   Office  [phone]

## 2018-10-11 NOTE — Progress Notes (Signed)
Subjective:  CC:  Brad [name] is a 43 y.o. male  Hospital stay day 5,   diverticulitis  HPI: Pain continuing to improve.  Wbc decreased as well.  Tolerating clears.  ROS:  General: Denies weight loss, weight gain, fatigue, fevers, chills, and night sweats. Heart: Denies chest pain, palpitations, racing heart, irregular heartbeat, leg pain or swelling, and decreased activity tolerance. Respiratory: Denies breathing difficulty, shortness of breath, wheezing, cough, and sputum. GI: Denies change in appetite, heartburn, nausea, vomiting, constipation, diarrhea, and blood in stool. GU: Denies difficulty urinating, pain with urinating, urgency, frequency, blood in urine.   Objective:      Temp:  [98 F (36.7 C)-98.8 F (37.1 C)] 98 F (36.7 C) (02/17 0727) Pulse Rate:  [78-90] 80 (02/17 1103) Resp:  [17-18] 17 (02/17 0727) BP: (148-208)/(98-114) 178/102 (02/17 1103) SpO2:  [88 %-98 %] 89 % (02/17 1103)     Height: 5\' 5"  (165.1 cm) Weight: 118.7 kg BMI (Calculated): 43.55   Intake/Output this shift:   Intake/Output Summary (Last 24 hours) at 10/11/2018 1154 Last data filed at 10/11/2018 0900 Gross per 24 hour  Intake 667.32 ml  Output -  Net 667.32 ml        Constitutional :  alert, cooperative, appears stated age and no distress  Respiratory:  clear to auscultation bilaterally  Cardiovascular:  regular rate and rhythm  Gastrointestinal: soft, minimal tenderness in LLQ now.   Skin: Cool and moist.   Psychiatric: Normal affect, non-agitated, not confused       LABS:  CMP Latest Ref Rng & Units 10/11/2018 10/09/2018 10/08/2018  Glucose 70 - 99 mg/dL 675(Q) 492(E) 100(F)  BUN 6 - 20 mg/dL 13 16 15   Creatinine 0.61 - 1.24 mg/dL 1.21 9.75(O) 8.32(P)  Sodium 135 - 145 mmol/L 138 139 138  Potassium 3.5 - 5.1 mmol/L 4.0 3.9 3.4(L)  Chloride 98 - 111 mmol/L 105 107 106  CO2 22 - 32 mmol/L 23 24 22   Calcium 8.9 - 10.3 mg/dL 8.9 4.9(I) 2.6(E)  Total Protein 6.5 - 8.1 g/dL -  - 7.1  Total Bilirubin 0.3 - 1.2 mg/dL - - 0.9  Alkaline Phos 38 - 126 U/L - - 44  AST 15 - 41 U/L - - 16  ALT 0 - 44 U/L - - 11   CBC Latest Ref Rng & Units 10/11/2018 10/10/2018 10/09/2018  WBC 4.0 - 10.5 K/uL 9.4 12.8(H) 14.9(H)  Hemoglobin 13.0 - 17.0 g/dL 15.8 30.9 40.7  Hematocrit 39.0 - 52.0 % 45.6 44.8 43.4  Platelets 150 - 400 K/uL 252 228 215    RADS: n/a Assessment:   Diverticulitis-continuing to improve both clinically and lab wise.  Advance to soft, continue to monitor.  If no issues, will advance to regular diet tomorrow and then d/c with f/u with me as outpt to schedule colonoscopy.

## 2018-10-12 ENCOUNTER — Telehealth: Payer: Self-pay | Admitting: Adult Health Nurse Practitioner

## 2018-10-12 LAB — CBC WITH DIFFERENTIAL/PLATELET
Abs Immature Granulocytes: 0.03 10*3/uL (ref 0.00–0.07)
BASOS PCT: 0 %
Basophils Absolute: 0 10*3/uL (ref 0.0–0.1)
Eosinophils Absolute: 0.3 10*3/uL (ref 0.0–0.5)
Eosinophils Relative: 4 %
HCT: 45.6 % (ref 39.0–52.0)
Hemoglobin: 14.9 g/dL (ref 13.0–17.0)
Immature Granulocytes: 0 %
Lymphocytes Relative: 24 %
Lymphs Abs: 1.8 10*3/uL (ref 0.7–4.0)
MCH: 26.7 pg (ref 26.0–34.0)
MCHC: 32.7 g/dL (ref 30.0–36.0)
MCV: 81.6 fL (ref 80.0–100.0)
MONOS PCT: 12 %
Monocytes Absolute: 1 10*3/uL (ref 0.1–1.0)
Neutro Abs: 4.7 10*3/uL (ref 1.7–7.7)
Neutrophils Relative %: 60 %
PLATELETS: 273 10*3/uL (ref 150–400)
RBC: 5.59 MIL/uL (ref 4.22–5.81)
RDW: 14.7 % (ref 11.5–15.5)
WBC: 7.8 10*3/uL (ref 4.0–10.5)
nRBC: 0 % (ref 0.0–0.2)

## 2018-10-12 LAB — BASIC METABOLIC PANEL
Anion gap: 7 (ref 5–15)
BUN: 17 mg/dL (ref 6–20)
CALCIUM: 8.8 mg/dL — AB (ref 8.9–10.3)
CO2: 23 mmol/L (ref 22–32)
Chloride: 108 mmol/L (ref 98–111)
Creatinine, Ser: 1.33 mg/dL — ABNORMAL HIGH (ref 0.61–1.24)
GFR calc Af Amer: >60 mL/min (ref >60)
Glucose, Bld: 124 mg/dL — ABNORMAL HIGH (ref 70–99)
Potassium: 3.5 mmol/L (ref 3.5–5.1)
Sodium: 138 mmol/L (ref 135–145)

## 2018-10-12 MED ORDER — HYDROCHLOROTHIAZIDE 50 MG PO TABS: 50.0000 mg | ORAL_TABLET | Freq: Every day | ORAL | 0 refills | Status: DC

## 2018-10-12 MED ORDER — DOCUSATE SODIUM 100 MG PO CAPS: 100.0000 mg | ORAL_CAPSULE | Freq: Two times a day (BID) | ORAL | 0 refills | Status: DC | PRN

## 2018-10-12 MED ORDER — LISINOPRIL 10 MG PO TABS: 10.0000 mg | ORAL_TABLET | Freq: Every day | ORAL | 0 refills | Status: DC

## 2018-10-12 MED ORDER — AMOXICILLIN-POT CLAVULANATE 875-125 MG PO TABS: 1.0000 | ORAL_TABLET | Freq: Two times a day (BID) | ORAL | 0 refills | Status: AC

## 2018-10-12 MED ORDER — AMLODIPINE BESYLATE 10 MG PO TABS: 10.0000 mg | ORAL_TABLET | Freq: Every day | ORAL | 0 refills | Status: DC

## 2018-10-12 MED ORDER — HYDRALAZINE HCL 50 MG PO TABS
50.0000 mg | ORAL_TABLET | Freq: Three times a day (TID) | ORAL | Status: DC
  Administered 2018-10-12: 50 mg via ORAL
  Filled 2018-10-12: qty 1

## 2018-10-12 MED ORDER — HYDRALAZINE HCL 50 MG PO TABS: 50.0000 mg | ORAL_TABLET | Freq: Three times a day (TID) | ORAL | 0 refills | Status: DC

## 2018-10-12 MED ORDER — HYDROCHLOROTHIAZIDE 50 MG PO TABS
50.0000 mg | ORAL_TABLET | Freq: Every day | ORAL | Status: DC
  Administered 2018-10-12: 50 mg via ORAL
  Filled 2018-10-12: qty 1

## 2018-10-12 NOTE — Care Management (Signed)
Met with patient to discuss DC plan and needs, He has no PCP and no insurance Made a [facility], provided the [facility] with [facility]

## 2018-10-12 NOTE — Telephone Encounter (Signed)
-----   Message from Lorrie D Carter sent at 10/12/2018 10:45 AM EST ----- °Regarding: [facility] Patient °Please call the patient verify the patient is eligible for [facility] and go over eligibility requirements with the patient.  Verify the patient was given a application in the hospital if not please mail to patient.  If you have any questions please see Holly, Jenn or Lorrie.  ° °

## 2018-10-12 NOTE — Telephone Encounter (Signed)
Called patient to go over eligibility for [facility]. Phone number is invalid, plan to try other number on file.

## 2018-10-12 NOTE — Discharge Instructions (Signed)
It was so nice to meet you during this hospitalization!  You came into the hospital with abdominal pain. This was caused by diverticulitis. I have prescribed an antibiotic called Augmentin for you to take at home. You should take this twice a day for 3 more days. I have also prescribed a stool softener called Colace that you can take twice a day as needed at home.  Your blood pressure was very high during this hospitalization. I have started three blood pressure medicines- Please take Norvasc 10mg  daily, Lisinopril 10mg  daily, and Hydrochlorothiazide 50mg  daily. You should follow-up with your primary care doctor to have your blood pressure rechecked.  Take care, Dr. Nancy Marus

## 2018-10-12 NOTE — Telephone Encounter (Signed)
Called pt to go over [place]. Requirements for [facility]. Someone else answered phone and said pt was gone to pick up medications and to call back tomorrow (10/13/2018)

## 2018-10-12 NOTE — Progress Notes (Addendum)
Patient's BP still elevated after medication given. attempted to check manual BP. Patient refuses anymore treatment and request to leave AMA. Dr. Nancy Marus notified. AMA paperwork signed. This nurse explained to patient that with elevated bp he is at a higher risk for stroke, and that this paperwork means that he is leaving against the medical advice of treating bp so that it is in normal range, reducing risk of stroke. Patient verbalized understanding. Trudee Kuster

## 2018-10-12 NOTE — Progress Notes (Addendum)
Discharge delayed d/t elevated bp, daily amlodipine and lisinopril given without result. Hydralazine PO added, patient c/o chills and nausea, BP also still elevated. HCTZ added and also ordered to give IV labetalol (per Dr. Nancy Marus), will recheck. While waiting discharge instructions completed. Patient instructed in discharge not to take hydralazine, only amlodipine, lisinopril, and hctz. Patient also educated on importance of taking bp meds and checking bp daily until it is more controlled. Patient verbalized understanding. All follow up appointments made included PCP.  Trudee Kuster

## 2018-10-12 NOTE — Telephone Encounter (Signed)
-----   Message from Ezekiel Ina sent at 10/12/2018 10:45 AM EST ----- Regarding: [facility] Patient Please call the patient verify the patient is eligible for [facility] and go over eligibility requirements with the patient.  Verify the patient was given a application in the hospital if not please mail to patient.  If you have any questions please see Cicero Duck or Lorrie.

## 2018-10-12 NOTE — Progress Notes (Signed)
Subjective:  CC:  Brad [name] is a 43 y.o. male  Hospital stay day 6,   diverticulitis  HPI: Pain resolved.  Tolerating soft  ROS:  General: Denies weight loss, weight gain, fatigue, fevers, chills, and night sweats. Heart: Denies chest pain, palpitations, racing heart, irregular heartbeat, leg pain or swelling, and decreased activity tolerance. Respiratory: Denies breathing difficulty, shortness of breath, wheezing, cough, and sputum. GI: Denies change in appetite, heartburn, nausea, vomiting, constipation, diarrhea, and blood in stool. GU: Denies difficulty urinating, pain with urinating, urgency, frequency, blood in urine.   Objective:      Temp:  [97.5 F (36.4 C)-98.7 F (37.1 C)] 98.2 F (36.8 C) (02/18 0722) Pulse Rate:  [78-105] 88 (02/18 0722) Resp:  [17-18] 18 (02/18 0722) BP: (162-192)/(95-102) 192/101 (02/18 0722) SpO2:  [89 %-95 %] 95 % (02/18 0722)     Height: 5\' 5"  (165.1 cm) Weight: 118.7 kg BMI (Calculated): 43.55   Intake/Output this shift:   Intake/Output Summary (Last 24 hours) at 10/12/2018 0741 Last data filed at 10/12/2018 0553 Gross per 24 hour  Intake 346.01 ml  Output 600 ml  Net -253.99 ml        Constitutional :  alert, cooperative, appears stated age and no distress  Respiratory:  clear to auscultation bilaterally  Cardiovascular:  regular rate and rhythm  Gastrointestinal: soft, non-tender; bowel sounds normal; no masses,  no organomegaly.   Skin: Cool and moist.   Psychiatric: Normal affect, non-agitated, not confused       LABS:  CMP Latest Ref Rng & Units 10/12/2018 10/11/2018 10/09/2018  Glucose 70 - 99 mg/dL 226(J) 335(K) 562(B)  BUN 6 - 20 mg/dL 17 13 16   Creatinine 0.61 - 1.24 mg/dL 6.38(L) 3.73 4.28(J)  Sodium 135 - 145 mmol/L 138 138 139  Potassium 3.5 - 5.1 mmol/L 3.5 4.0 3.9  Chloride 98 - 111 mmol/L 108 105 107  CO2 22 - 32 mmol/L 23 23 24   Calcium 8.9 - 10.3 mg/dL 6.8(T) 8.9 1.5(B)  Total Protein 6.5 - 8.1 g/dL - -  -  Total Bilirubin 0.3 - 1.2 mg/dL - - -  Alkaline Phos 38 - 126 U/L - - -  AST 15 - 41 U/L - - -  ALT 0 - 44 U/L - - -   CBC Latest Ref Rng & Units 10/12/2018 10/11/2018 10/10/2018  WBC 4.0 - 10.5 K/uL 7.8 9.4 12.8(H)  Hemoglobin 13.0 - 17.0 g/dL 26.2 03.5 59.7  Hematocrit 39.0 - 52.0 % 45.6 45.6 44.8  Platelets 150 - 400 K/uL 273 252 228    RADS: n/a Assessment:   Diverticulitis-pain resolved, tolerating soft diet.  Ok to d/c from surgery standpoint with oral abx.  F/u two weeks in office.  Pt will also need to establish primary care for further monitoring of other kidney and HTN issues.

## 2018-10-12 NOTE — Discharge Summary (Addendum)
[employer] [facility] [facility] [place] at [facility]   PATIENT NAME: Brad [name]    MR#:  [id number]  DATE OF BIRTH:  [birth year]  DATE OF ADMISSION:  10/06/2018   ADMITTING PHYSICIAN: Altamese Dilling, MD  DATE OF DISCHARGE: 10/12/18  PRIMARY CARE PHYSICIAN: Patient, No Pcp Per   ADMISSION DIAGNOSIS:  Diverticulitis [K57.92] DISCHARGE DIAGNOSIS:  Principal Problem:   Acute diverticulitis  SECONDARY DIAGNOSIS:   Past Medical History:  Diagnosis Date  . Asthma   . Diverticulitis   . Hypertension    HOSPITAL COURSE:   Brad [name] is a 43 year old male who presented to the ED with abdominal pain, nausea, vomiting.  CT scan showed acute diverticulitis.  He was admitted for further management.  Acute diverticulitis- initially with severe abdominal pain and elevated WBC count, both of these resolved. -Able to tolerate regular diet without any issues for 24 hours prior to discharge -Initially on ceftriaxone and Flagyl, then transitioned to meropenem, then discharged on Augmentin for a total 10-day course of antibiotics -Patient should follow-up with general surgery in 2 weeks for discussion of possible colonoscopy  Hypertension- BPs elevated throughout admission.  Patient does not take any blood pressure medicines at home. -Patient started on Norvasc 10 mg, lisinopril 10 mg, and HCTZ 50 mg daily this admission -Needs to follow-up with PCP for blood pressure management as an outpatient -Patient's BPs still elevated on the day of discharge, patient left AMA before we could achieve adequate blood pressure control.  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 -Needs kidney function rechecked as an outpatient in the next week  Tobacco use -Tobacco cessation counseling performed this hospitalization  DISCHARGE CONDITIONS:  Hypertension CKD 2-3 Tobacco use CONSULTS OBTAINED:  Treatment Team:  Sung Amabile, DO DRUG ALLERGIES:  No Known Allergies DISCHARGE MEDICATIONS:    Allergies as of 10/12/2018   No Known Allergies     Medication List    STOP taking these medications   baclofen 10 MG tablet Commonly known as:  LIORESAL   HYDROcodone-acetaminophen 5-325 MG tablet Commonly known as:  NORCO   meloxicam 15 MG tablet Commonly known as:  MOBIC   metroNIDAZOLE 500 MG tablet Commonly known as:  FLAGYL   polyethylene glycol packet Commonly known as:  MIRALAX / GLYCOLAX   promethazine 12.5 MG tablet Commonly known as:  PHENERGAN     TAKE these medications   amLODipine 10 MG tablet Commonly known as:  NORVASC Take 1 tablet (10 mg total) by mouth daily. Start taking on:  October 13, 2018   amoxicillin-clavulanate 875-125 MG tablet Commonly known as:  AUGMENTIN Take 1 tablet by mouth 2 (two) times daily for 3 days.   docusate sodium 100 MG capsule Commonly known as:  COLACE Take 1 capsule (100 mg total) by mouth 2 (two) times daily as needed for mild constipation.   hydrALAZINE 50 MG tablet Commonly known as:  APRESOLINE Take 1 tablet (50 mg total) by mouth 3 (three) times daily. Notes to patient:  DO NOT take this medication per Dr. Nancy Marus   hydrochlorothiazide 50 MG tablet Commonly known as:  HYDRODIURIL Take 1 tablet (50 mg total) by mouth daily.   lisinopril 10 MG tablet Commonly known as:  PRINIVIL,ZESTRIL Take 1 tablet (10 mg total) by mouth daily. Start taking on:  October 13, 2018        DISCHARGE INSTRUCTIONS:  1.  Follow-up with PCP in 5 days 2.  Follow-up with surgery in 2 weeks 3.  Started on Norvasc, lisinopril, HCTZ  this admission 4.  Needs to take Augmentin for 3 more days for a total 10-day course of antibiotics 5.  Needs close blood pressure monitoring as an outpatient DIET:  Cardiac diet DISCHARGE CONDITION:  Stable ACTIVITY:  Activity as tolerated OXYGEN:  Home Oxygen: No.  Oxygen Delivery: room air DISCHARGE LOCATION:  home   If you experience worsening of your admission symptoms, develop  shortness of breath, life threatening emergency, suicidal or homicidal thoughts you must seek medical attention immediately by calling 911 or calling your MD immediately  if symptoms less severe.  You Must read complete instructions/literature along with all the possible adverse reactions/side effects for all the Medicines you take and that have been prescribed to you. Take any new Medicines after you have completely understood and accpet all the possible adverse reactions/side effects.   Please note  You were cared for by a hospitalist during your hospital stay. If you have any questions about your discharge medications or the care you received while you were in the hospital after you are discharged, you can call the unit and asked to speak with the hospitalist on call if the hospitalist that took care of you is not available. Once you are discharged, your primary care physician will handle any further medical issues. Please note that NO REFILLS for any discharge medications will be authorized once you are discharged, as it is imperative that you return to your primary care physician (or establish a relationship with a primary care physician if you do not have one) for your aftercare needs so that they can reassess your need for medications and monitor your lab values.    On the day of Discharge:  VITAL SIGNS:  Blood pressure (!) 184/116, pulse 82, temperature 98.1 F (36.7 C), temperature source Oral, resp. rate (!) 24, height 5\' 5"  (1.651 m), weight 118.7 kg, SpO2 98 %. PHYSICAL EXAMINATION:  GENERAL:  43 y.o.-year-old patient lying in the bed with no acute distress.  EYES: Pupils equal, round, reactive to light and accommodation. No scleral icterus. Extraocular muscles intact.  HEENT: Head atraumatic, normocephalic. Oropharynx and nasopharynx clear.  NECK:  Supple, no jugular venous distention. No thyroid enlargement, no tenderness.  LUNGS: Normal breath sounds bilaterally, no wheezing, rales,  rhonchi or crepitation. No use of accessory muscles of respiration.  CARDIOVASCULAR: RRR, S1, S2 normal. No murmurs, rubs, or gallops.  ABDOMEN: Soft, non-distended. Bowel sounds present. No organomegaly or mass. + Mild tenderness to deep palpation of the left lower quadrant, no rebound or guarding. EXTREMITIES: No pedal edema, cyanosis, or clubbing.  NEUROLOGIC: Cranial nerves II through XII are intact. Muscle strength 5/5 in all extremities. Sensation intact. Gait not checked.  PSYCHIATRIC: The patient is alert and oriented x 3.  SKIN: No obvious rash, lesion, or ulcer.  DATA REVIEW:   CBC Recent Labs  Lab 10/12/18 0413  WBC 7.8  HGB 14.9  HCT 45.6  PLT 273    Chemistries  Recent Labs  Lab 10/08/18 1013  10/12/18 0413  NA 138   < > 138  K 3.4*   < > 3.5  CL 106   < > 108  CO2 22   < > 23  GLUCOSE 159*   < > 124*  BUN 15   < > 17  CREATININE 1.41*   < > 1.33*  CALCIUM 8.6*   < > 8.8*  AST 16  --   --   ALT 11  --   --   [facility]  44  --   --   BILITOT 0.9  --   --    < > = values in this interval not displayed.     Microbiology Results  No results found for this or any previous visit.  RADIOLOGY:  No results found.   Management plans discussed with the patient, family and they are in agreement.  CODE STATUS: Full Code   TOTAL TIME TAKING CARE OF THIS PATIENT: 40 minutes.    Jinny BlossomKaty D Seva Chancy M.D on 10/12/2018 at 1:08 PM  Between 7am to 6pm - Pager - [phone]  After 6pm go to [email or web address] - Social research officer, governmentpassword [email or web address]  [facility] [place] [facility]  Office  [phone]  CC: Primary care physician; Patient, No Pcp Per   Note: This dictation was prepared with Dragon dictation along with smaller phrase technology. Any transcriptional errors that result from this process are unintentional.

## 2018-11-22 ENCOUNTER — Ambulatory Visit: Payer: Self-pay | Admitting: Physician Assistant

## 2018-12-09 ENCOUNTER — Ambulatory Visit: Admit: 2018-12-09 | Payer: Self-pay | Admitting: Surgery

## 2018-12-09 SURGERY — COLONOSCOPY WITH PROPOFOL
Anesthesia: General

## 2019-12-08 ENCOUNTER — Emergency Department: Payer: Self-pay

## 2019-12-08 ENCOUNTER — Inpatient Hospital Stay
Admission: EM | Admit: 2019-12-08 | Discharge: 2019-12-10 | DRG: 392 | Disposition: A | Discharge status: Home / Self Care | Payer: Self-pay | Attending: Internal Medicine | Admitting: Internal Medicine

## 2019-12-08 ENCOUNTER — Other Ambulatory Visit: Payer: Self-pay

## 2019-12-08 DIAGNOSIS — J45909 Unspecified asthma, uncomplicated: Secondary | ICD-10-CM | POA: Diagnosis present

## 2019-12-08 DIAGNOSIS — I160 Hypertensive urgency: Secondary | ICD-10-CM | POA: Diagnosis present

## 2019-12-08 DIAGNOSIS — I10 Essential (primary) hypertension: Secondary | ICD-10-CM | POA: Diagnosis present

## 2019-12-08 DIAGNOSIS — Z8249 Family history of ischemic heart disease and other diseases of the circulatory system: Secondary | ICD-10-CM

## 2019-12-08 DIAGNOSIS — K5792 Diverticulitis of intestine, part unspecified, without perforation or abscess without bleeding: Secondary | ICD-10-CM

## 2019-12-08 DIAGNOSIS — F1210 Cannabis abuse, uncomplicated: Secondary | ICD-10-CM | POA: Diagnosis present

## 2019-12-08 DIAGNOSIS — F17210 Nicotine dependence, cigarettes, uncomplicated: Secondary | ICD-10-CM | POA: Diagnosis present

## 2019-12-08 DIAGNOSIS — Z20822 Contact with and (suspected) exposure to covid-19: Secondary | ICD-10-CM | POA: Diagnosis present

## 2019-12-08 DIAGNOSIS — K5732 Diverticulitis of large intestine without perforation or abscess without bleeding: Principal | ICD-10-CM | POA: Diagnosis present

## 2019-12-08 DIAGNOSIS — Z9114 Patient's other noncompliance with medication regimen: Secondary | ICD-10-CM

## 2019-12-08 DIAGNOSIS — F1410 Cocaine abuse, uncomplicated: Secondary | ICD-10-CM | POA: Diagnosis present

## 2019-12-08 DIAGNOSIS — Z79899 Other long term (current) drug therapy: Secondary | ICD-10-CM

## 2019-12-08 DIAGNOSIS — N179 Acute kidney failure, unspecified: Secondary | ICD-10-CM | POA: Diagnosis present

## 2019-12-08 LAB — CBC
HCT: 49.9 % (ref 39.0–52.0)
Hemoglobin: 16.4 g/dL (ref 13.0–17.0)
MCH: 26.3 pg (ref 26.0–34.0)
MCHC: 32.9 g/dL (ref 30.0–36.0)
MCV: 80.1 fL (ref 80.0–100.0)
Platelets: 290 10*3/uL (ref 150–400)
RBC: 6.23 MIL/uL — ABNORMAL HIGH (ref 4.22–5.81)
RDW: 16.7 % — ABNORMAL HIGH (ref 11.5–15.5)
WBC: 11.8 10*3/uL — ABNORMAL HIGH (ref 4.0–10.5)
nRBC: 0 % (ref 0.0–0.2)

## 2019-12-08 LAB — URINE DRUG SCREEN, QUALITATIVE (ARMC ONLY)
Amphetamines, Ur Screen: NOT DETECTED
Barbiturates, Ur Screen: NOT DETECTED
Benzodiazepine, Ur Scrn: NOT DETECTED
Cannabinoid 50 Ng, Ur ~~LOC~~: POSITIVE — AB
Cocaine Metabolite,Ur ~~LOC~~: POSITIVE — AB
MDMA (Ecstasy)Ur Screen: NOT DETECTED
Methadone Scn, Ur: NOT DETECTED
Opiate, Ur Screen: POSITIVE — AB
Phencyclidine (PCP) Ur S: NOT DETECTED
Tricyclic, Ur Screen: NOT DETECTED

## 2019-12-08 LAB — RESPIRATORY PANEL BY RT PCR (FLU A&B, COVID)
Influenza A by PCR: NEGATIVE
Influenza B by PCR: NEGATIVE
SARS Coronavirus 2 by RT PCR: NEGATIVE

## 2019-12-08 LAB — COMPREHENSIVE METABOLIC PANEL
ALT: 13 U/L (ref 0–44)
AST: 18 U/L (ref 15–41)
Albumin: 4.2 g/dL (ref 3.5–5.0)
Alkaline Phosphatase: 64 U/L (ref 38–126)
Anion gap: 14 (ref 5–15)
BUN: 33 mg/dL — ABNORMAL HIGH (ref 6–20)
CO2: 24 mmol/L (ref 22–32)
Calcium: 9.6 mg/dL (ref 8.9–10.3)
Chloride: 101 mmol/L (ref 98–111)
Creatinine, Ser: 2.22 mg/dL — ABNORMAL HIGH (ref 0.61–1.24)
GFR calc Af Amer: 41 mL/min — ABNORMAL LOW (ref >60)
GFR calc non Af Amer: 35 mL/min — ABNORMAL LOW (ref >60)
Glucose, Bld: 120 mg/dL — ABNORMAL HIGH (ref 70–99)
Potassium: 3.5 mmol/L (ref 3.5–5.1)
Sodium: 139 mmol/L (ref 135–145)
Total Bilirubin: 1.1 mg/dL (ref 0.3–1.2)
Total Protein: 8.6 g/dL — ABNORMAL HIGH (ref 6.5–8.1)

## 2019-12-08 LAB — URINALYSIS, COMPLETE (UACMP) WITH MICROSCOPIC
Bilirubin Urine: NEGATIVE
Glucose, UA: 50 mg/dL — AB
Hgb urine dipstick: NEGATIVE
Ketones, ur: 5 mg/dL — AB
Leukocytes,Ua: NEGATIVE
Nitrite: NEGATIVE
Protein, ur: >=300 mg/dL — AB
Specific Gravity, Urine: 1.018 (ref 1.005–1.030)
Squamous Epithelial / LPF: NONE SEEN (ref 0–5)
pH: 6 (ref 5.0–8.0)

## 2019-12-08 LAB — LIPASE, BLOOD: Lipase: 26 U/L (ref 11–51)

## 2019-12-08 MED ORDER — PIPERACILLIN-TAZOBACTAM 3.375 G IVPB 30 MIN
3.3750 g | Freq: Once | INTRAVENOUS | Status: AC
  Administered 2019-12-08: 3.375 g via INTRAVENOUS
  Filled 2019-12-08: qty 50

## 2019-12-08 MED ORDER — ONDANSETRON HCL 4 MG/2ML IJ SOLN
INTRAMUSCULAR | Status: AC
  Administered 2019-12-08: 4 mg via INTRAVENOUS
  Filled 2019-12-08: qty 2

## 2019-12-08 MED ORDER — HYDRALAZINE HCL 50 MG PO TABS
100.0000 mg | ORAL_TABLET | Freq: Four times a day (QID) | ORAL | Status: DC
  Administered 2019-12-08 – 2019-12-10 (×7): 100 mg via ORAL
  Filled 2019-12-08 (×7): qty 2

## 2019-12-08 MED ORDER — HYDROCODONE-ACETAMINOPHEN 5-325 MG PO TABS
1.0000 | ORAL_TABLET | ORAL | Status: DC | PRN
  Administered 2019-12-09 – 2019-12-10 (×2): 1 via ORAL
  Filled 2019-12-08 (×2): qty 1

## 2019-12-08 MED ORDER — POLYETHYLENE GLYCOL 3350 17 G PO PACK: 17.0000 g | PACK | Freq: Every day | ORAL | Status: DC | PRN

## 2019-12-08 MED ORDER — MORPHINE SULFATE (PF) 2 MG/ML IV SOLN
2.0000 mg | INTRAVENOUS | Status: DC | PRN
  Administered 2019-12-10: 2 mg via INTRAVENOUS
  Filled 2019-12-08: qty 1

## 2019-12-08 MED ORDER — HYDRALAZINE HCL 20 MG/ML IJ SOLN
20.0000 mg | Freq: Once | INTRAMUSCULAR | Status: AC
  Administered 2019-12-08: 17:00:00 20 mg via INTRAVENOUS
  Filled 2019-12-08: qty 1

## 2019-12-08 MED ORDER — SODIUM CHLORIDE 0.9 % IV SOLN
INTRAVENOUS | Status: DC
  Administered 2019-12-08: 1000 mL via INTRAVENOUS

## 2019-12-08 MED ORDER — SODIUM CHLORIDE 0.9 % IV SOLN: Freq: Once | INTRAVENOUS | Status: AC

## 2019-12-08 MED ORDER — ONDANSETRON 4 MG PO TBDP: 4.0000 mg | ORAL_TABLET | Freq: Once | ORAL | Status: DC | PRN

## 2019-12-08 MED ORDER — SODIUM CHLORIDE 0.9% FLUSH
3.0000 mL | Freq: Once | INTRAVENOUS | Status: AC
  Administered 2019-12-08: 17:00:00 3 mL via INTRAVENOUS

## 2019-12-08 MED ORDER — HYDRALAZINE HCL 20 MG/ML IJ SOLN
10.0000 mg | Freq: Four times a day (QID) | INTRAMUSCULAR | Status: DC | PRN
  Administered 2019-12-09: 10 mg via INTRAVENOUS
  Filled 2019-12-08: qty 1

## 2019-12-08 MED ORDER — ONDANSETRON HCL 4 MG/2ML IJ SOLN: 4.0000 mg | Freq: Once | INTRAMUSCULAR | Status: AC

## 2019-12-08 MED ORDER — AMLODIPINE BESYLATE 10 MG PO TABS
10.0000 mg | ORAL_TABLET | Freq: Every day | ORAL | Status: DC
  Administered 2019-12-08 – 2019-12-10 (×3): 10 mg via ORAL
  Filled 2019-12-08 (×2): qty 2
  Filled 2019-12-08: qty 1

## 2019-12-08 MED ORDER — ACETAMINOPHEN 650 MG RE SUPP: 650.0000 mg | Freq: Four times a day (QID) | RECTAL | Status: DC | PRN

## 2019-12-08 MED ORDER — DOCUSATE SODIUM 100 MG PO CAPS: 100.0000 mg | ORAL_CAPSULE | Freq: Two times a day (BID) | ORAL | Status: DC | PRN

## 2019-12-08 MED ORDER — BISACODYL 5 MG PO TBEC: 5.0000 mg | DELAYED_RELEASE_TABLET | Freq: Every day | ORAL | Status: DC | PRN

## 2019-12-08 MED ORDER — CIPROFLOXACIN IN D5W 200 MG/100ML IV SOLN
200.0000 mg | Freq: Two times a day (BID) | INTRAVENOUS | Status: DC
  Administered 2019-12-08 – 2019-12-10 (×4): 200 mg via INTRAVENOUS
  Filled 2019-12-08 (×5): qty 100

## 2019-12-08 MED ORDER — ONDANSETRON HCL 4 MG/2ML IJ SOLN
4.0000 mg | Freq: Four times a day (QID) | INTRAMUSCULAR | Status: DC | PRN
  Administered 2019-12-09: 4 mg via INTRAVENOUS
  Filled 2019-12-08: qty 2

## 2019-12-08 MED ORDER — FENTANYL CITRATE (PF) 100 MCG/2ML IJ SOLN
50.0000 ug | INTRAMUSCULAR | Status: DC | PRN
  Administered 2019-12-08: 14:00:00 50 ug via INTRAVENOUS
  Filled 2019-12-08: qty 2

## 2019-12-08 MED ORDER — DOCUSATE SODIUM 100 MG PO CAPS
100.0000 mg | ORAL_CAPSULE | Freq: Two times a day (BID) | ORAL | Status: DC
  Administered 2019-12-08 – 2019-12-10 (×3): 100 mg via ORAL
  Filled 2019-12-08 (×3): qty 1

## 2019-12-08 MED ORDER — ACETAMINOPHEN 325 MG PO TABS
650.0000 mg | ORAL_TABLET | Freq: Four times a day (QID) | ORAL | Status: DC | PRN
  Administered 2019-12-10: 650 mg via ORAL
  Filled 2019-12-08: qty 2

## 2019-12-08 MED ORDER — HEPARIN SODIUM (PORCINE) 5000 UNIT/ML IJ SOLN
5000.0000 [IU] | Freq: Three times a day (TID) | INTRAMUSCULAR | Status: DC
  Administered 2019-12-08 – 2019-12-10 (×3): 5000 [IU] via SUBCUTANEOUS
  Filled 2019-12-08 (×3): qty 1

## 2019-12-08 MED ORDER — ONDANSETRON HCL 4 MG PO TABS: 4.0000 mg | ORAL_TABLET | Freq: Four times a day (QID) | ORAL | Status: DC | PRN

## 2019-12-08 MED ORDER — METRONIDAZOLE IN NACL 5-0.79 MG/ML-% IV SOLN
500.0000 mg | Freq: Three times a day (TID) | INTRAVENOUS | Status: DC
  Administered 2019-12-08 – 2019-12-10 (×6): 500 mg via INTRAVENOUS
  Filled 2019-12-08 (×8): qty 100

## 2019-12-08 MED ORDER — HYDROMORPHONE HCL 1 MG/ML IJ SOLN: 1.0000 mg | Freq: Once | INTRAMUSCULAR | Status: DC

## 2019-12-08 MED ORDER — HYDROMORPHONE HCL 1 MG/ML IJ SOLN
2.0000 mg | Freq: Once | INTRAMUSCULAR | Status: AC
  Administered 2019-12-08: 2 mg via INTRAVENOUS
  Filled 2019-12-08: qty 2

## 2019-12-08 NOTE — ED Notes (Signed)
Hydralazine and cipro given. Will recheck BP in 1 hour before giving norvasc. Pt given phone to call family.

## 2019-12-08 NOTE — H&P (Signed)
[place] at [facility]   PATIENT NAME: Brad [name]    MR#:  [id number]  DATE OF BIRTH:  February 29, 1976  DATE OF ADMISSION:  12/08/2019  PRIMARY CARE PHYSICIAN: Patient, No Pcp Per   REQUESTING/REFERRING PHYSICIAN: Emily Filbert, MD  CHIEF COMPLAINT:   Chief Complaint  Patient presents with  . Abdominal Pain    HISTORY OF PRESENT ILLNESS:  Brad [name]  is a 44 y.o. male with a known history of asthma, diverticulitis, hypertension is being admitted for acute diverticulitis.  Patient  reports left lower quadrant pain with nausea vomiting which started yesterday when he was at his friend's house.  Patient vomited in ED with 10 out of 10 pain.  He was given Zofran and fentanyl in the ED. CT shows acute diverticulitis and is being admitted for further evaluation. His pain is controlled now. He request D/C before Sunday as it's birthday then. PAST MEDICAL HISTORY:   Past Medical History:  Diagnosis Date  . Asthma   . Diverticulitis   . Hypertension    PAST SURGICAL HISTORY:   Past Surgical History:  Procedure Laterality Date  . ANKLE SURGERY     SOCIAL HISTORY:   Social History   Tobacco Use  . Smoking status: Current Every Day Smoker    Packs/day: 0.50    Types: Cigarettes  . Smokeless tobacco: Never Used  Substance Use Topics  . Alcohol use: Yes    Comment: occas   FAMILY HISTORY:   Family History  Problem Relation Age of Onset  . Hypertension Mother    DRUG ALLERGIES:  No Known Allergies REVIEW OF SYSTEMS:  Review of Systems  Constitutional: Negative for diaphoresis, fever, malaise/fatigue and weight loss.  HENT: Negative for ear discharge, ear pain, hearing loss, nosebleeds, sore throat and tinnitus.   Eyes: Negative for blurred vision and pain.  Respiratory: Negative for cough, hemoptysis, shortness of breath and wheezing.   Cardiovascular: Negative for chest pain, palpitations, orthopnea and leg swelling.  Gastrointestinal: Positive  for abdominal pain, nausea and vomiting. Negative for blood in stool, constipation, diarrhea and heartburn.  Genitourinary: Negative for dysuria, frequency and urgency.  Musculoskeletal: Negative for back pain and myalgias.  Skin: Negative for itching and rash.  Neurological: Negative for dizziness, tingling, tremors, focal weakness, seizures, weakness and headaches.  Psychiatric/Behavioral: Negative for depression. The patient is not nervous/anxious.    MEDICATIONS AT HOME:   Prior to Admission medications   Medication Sig Start Date End Date Taking? Authorizing Provider  hydrochlorothiazide (HYDRODIURIL) 50 MG tablet Take 1 tablet (50 mg total) by mouth daily. 10/12/18  Yes Mayo, Allyn Kenner, MD  amLODipine (NORVASC) 10 MG tablet Take 1 tablet (10 mg total) by mouth daily. 10/13/18   Mayo, Allyn Kenner, MD  docusate sodium (COLACE) 100 MG capsule Take 1 capsule (100 mg total) by mouth 2 (two) times daily as needed for mild constipation. 10/12/18   Mayo, Allyn Kenner, MD  hydrALAZINE (APRESOLINE) 50 MG tablet Take 1 tablet (50 mg total) by mouth 3 (three) times daily. 10/12/18   Mayo, Allyn Kenner, MD  lisinopril (PRINIVIL,ZESTRIL) 10 MG tablet Take 1 tablet (10 mg total) by mouth daily. 10/13/18   Mayo, Allyn Kenner, MD    VITAL SIGNS:  Blood pressure (!) 201/119, pulse 70, resp. rate 16, height 5\' 6"  (1.676 m), weight 117.9 kg, SpO2 95 %. PHYSICAL EXAMINATION:  Physical Exam  GENERAL:  44 y.o.-year-old patient lying in the bed with no acute distress.  EYES: Pupils  equal, round, reactive to light and accommodation. No scleral icterus. Extraocular muscles intact.  HEENT: Head atraumatic, normocephalic. Oropharynx and nasopharynx clear.  NECK:  Supple, no jugular venous distention. No thyroid enlargement, no tenderness.  LUNGS: Normal breath sounds bilaterally, no wheezing, rales,rhonchi or crepitation. No use of accessory muscles of respiration.  CARDIOVASCULAR: S1, S2 normal. No murmurs, rubs, or gallops.   ABDOMEN: Soft, Left lower quadrant tenderness +, Bowel sounds present. No organomegaly or mass.  EXTREMITIES: No pedal edema, cyanosis, or clubbing.  NEUROLOGIC: Cranial nerves II through XII are intact. Muscle strength 5/5 in all extremities. Sensation intact. Gait not checked.  PSYCHIATRIC: The patient is alert and oriented x 3.  SKIN: No obvious rash, lesion, or ulcer.  LABORATORY PANEL:   CBC Recent Labs  Lab 12/08/19 1416  WBC 11.8*  HGB 16.4  HCT 49.9  PLT 290   ------------------------------------------------------------------------------------------------------------------  Chemistries  Recent Labs  Lab 12/08/19 1416  NA 139  K 3.5  CL 101  CO2 24  GLUCOSE 120*  BUN 33*  CREATININE 2.22*  CALCIUM 9.6  AST 18  ALT 13  ALKPHOS 64  BILITOT 1.1   ------------------------------------------------------------------------------------------------------------------  Cardiac Enzymes No results for input(s): TROPONINI in the last 168 hours. ------------------------------------------------------------------------------------------------------------------  RADIOLOGY:  CT ABDOMEN PELVIS WO CONTRAST  Result Date: 12/08/2019 CLINICAL DATA:  44 year old with acute onset of LEFT LOWER QUADRANT abdominal pain, nausea and vomiting that began yesterday. Intravenous contrast was not administered due to renal insufficiency. EXAM: CT ABDOMEN AND PELVIS WITHOUT CONTRAST TECHNIQUE: Multidetector CT imaging of the abdomen and pelvis was performed following the standard protocol without IV contrast. COMPARISON:  10/08/2018 and earlier. FINDINGS: Lower chest: Cardiac silhouette moderately enlarged, unchanged. Visualized lung bases clear. Hepatobiliary: Normal unenhanced appearance of the liver. Gallbladder normal in appearance without calcified gallstones. No biliary ductal dilation. Pancreas: Normal unenhanced appearance. Spleen: Normal unenhanced appearance. Adrenals/Urinary Tract: Normal  appearing adrenal glands. Stable benign approximate 1.5 cm cyst arising from the mid LEFT kidney. Within the limits of the unenhanced technique, no significant focal parenchymal abnormality involving either kidney. No hydronephrosis. No urinary tract calculi. Urinary bladder decompressed and unremarkable. Stomach/Bowel: Stomach normal in appearance for the degree of distention. Normal-appearing small bowel. Descending and sigmoid colon diverticulosis without evidence of acute diverticulitis involving the distal descending colon at its junction with the sigmoid colon. No evidence of extraluminal gas or abscess. Edema/inflammation in the adjacent fat, and minimal fluid in the LEFT paracolic gutter. Mobile cecum in the RIGHT mid abdomen, and normal appearing appendix in the RIGHT upper pelvis. Vascular/Lymphatic: Mild aorto-iliofemoral atherosclerosis, somewhat advanced for patient age, without evidence of aneurysm. No pathologic lymphadenopathy. Reproductive: Prostate gland enlargement is suspected, especially the median lobe, and the fat planes surrounding the prostate gland are attenuated. Normal seminal vesicles. Other: Very small umbilical hernia containing fat. Musculoskeletal: Facet degenerative changes involving the lower lumbar spine. No acute findings. IMPRESSION: 1. Descending and sigmoid colon diverticulosis with acute diverticulitis involving the distal descending colon at its junction with the sigmoid colon. No evidence of perforation or abscess. (The patient had acute diverticulitis in this same location in February, 2020 and in February, 2018). 2. Prostate gland enlargement, especially the median lobe, and the fat planes surrounding the prostate gland are attenuated. Please correlate with PSA. Aortic Atherosclerosis (ICD10-I70.0). Electronically Signed   By: Hulan Saas M.D.   On: 12/08/2019 17:03   IMPRESSION AND PLAN:  Daryus Sowash  is a 44 y.o. male with a known history of Hypertension,  polysubstance abuse with cocaine and marijuana and tobacco abuse is being admitted for acute diverticulitis with symptoms of nausea vomiting abdominal pain.  *Acute diverticulitis Symptomatic treatment with IV pain and nausea medications. IV Cipro and Flagyl. IV fluids and keep n.p.o. except meds  *Hypertensive urgency Related to medication noncompliance Increase the dose of hydralazine 100 mg 4 times a day for better blood pressure control Will hold HCTZ and lisinopril due to acute renal failure  *Acute renal failure Need to follow renal function after IV fluids. Hold HCTZ and lisinopril Monitor renal function     All the records are reviewed and case discussed with ED provider. Management plans discussed with the patient, family and they are in agreement.  CODE STATUS: Full code  TOTAL TIME TAKING CARE OF THIS PATIENT: 45 minutes.    Max Sane M.D on 12/08/2019 at 5:43 PM  [facility]   CC: Primary care physician; Patient, No Pcp Per   Note: This dictation was prepared with Dragon dictation along with smaller phrase technology. Any transcriptional errors that result from this process are unintentional.

## 2019-12-08 NOTE — ED Notes (Signed)
When pt asked about home meds, pt states he stopped taking them on his own and was not d/c by provider. Message sent to md about starting home meds.

## 2019-12-08 NOTE — ED Notes (Signed)
See triage note, c/o abdominal pain, N/V/D. Denies fevers

## 2019-12-08 NOTE — ED Provider Notes (Signed)
[facility] Emergency Department Provider Note       Time seen: ----------------------------------------- 4:19 PM on 12/08/2019 -----------------------------------------   I have reviewed the triage vital signs and the nursing notes.  HISTORY   Chief Complaint Abdominal Pain   HPI Brad [name] is a 44 y.o. male with a history of asthma, diverticulitis, hypertension who presents to the ED for left lower quadrant pain with nausea vomiting started yesterday.  Patient arrives actively vomiting with 10 out of 10 pain.  He was given Zofran and fentanyl prior to my evaluation.  Past Medical History:  Diagnosis Date  . Asthma   . Diverticulitis   . Hypertension     Patient Active Problem List   Diagnosis Date Noted  . Acute diverticulitis 10/06/2018  . 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 Intractable nausea and vomiting 05/31/2015    Past Surgical History:  Procedure Laterality Date  . ANKLE SURGERY      Allergies Patient has no known allergies.  Social History Social History   Tobacco Use  . Smoking status: Current Every Day Smoker    Packs/day: 0.50    Types: Cigarettes  . Smokeless tobacco: Never Used  Substance Use Topics  . Alcohol use: Yes    Comment: occas  . Drug use: No    Review of Systems Constitutional: Negative for fever. Cardiovascular: Negative for chest pain. Respiratory: Negative for shortness of breath. Gastrointestinal: Positive for abdominal pain, nausea vomiting Musculoskeletal: Negative for back pain. Skin: Negative for rash. Neurological: Negative for headaches, focal weakness or numbness.  All systems negative/normal/unremarkable except as stated in the HPI  ____________________________________________   PHYSICAL EXAM:  VITAL SIGNS: ED Triage Vitals  Enc Vitals Group     BP 12/08/19 1409 (!) 205/135     Pulse Rate 12/08/19 1409 87     Resp 12/08/19 1409 20     Temp --      Temp src --       SpO2 12/08/19 1409 98 %     Weight 12/08/19 1407 260 lb (117.9 kg)     Height 12/08/19 1407 5\' 6"  (1.676 m)     Head Circumference --      Peak Flow --      Pain Score 12/08/19 1407 10     Pain Loc --      Pain Edu? --      Excl. in [place]? --     Constitutional: Alert and oriented.  Mild distress from pain Eyes: Conjunctivae are normal. Normal extraocular movements. ENT      Head: Normocephalic and atraumatic.      Nose: No congestion/rhinnorhea.      Mouth/Throat: Mucous membranes are moist.      Neck: No stridor. Cardiovascular: Normal rate, regular rhythm. No murmurs, rubs, or gallops. Respiratory: Normal respiratory effort without tachypnea nor retractions. Breath sounds are clear and equal bilaterally. No wheezes/rales/rhonchi. Gastrointestinal: Left lower quadrant tenderness, normal bowel sounds. Musculoskeletal: Nontender with normal range of motion in extremities. No lower extremity tenderness nor edema. Neurologic:  Normal speech and language. No gross focal neurologic deficits are appreciated.  Skin:  Skin is warm, dry and intact. No rash noted. Psychiatric: Mood and affect are normal. Speech and behavior are normal.  ____________________________________________  ED COURSE:  As part of my medical decision making, I reviewed the following data within the [place] History obtained from family if available, nursing notes, old chart and ekg, as well as notes from  prior ED visits. Patient presented for left lower quadrant pain, we will assess with labs and imaging as indicated at this time.   Procedures  Brad [name] was evaluated in Emergency Department on 12/08/2019 for the symptoms described in the history of present illness. He was evaluated in the context of the global COVID-19 pandemic, which necessitated consideration that the patient might be at risk for infection with the SARS-CoV-2 virus that causes COVID-19. Institutional protocols and  algorithms that pertain to the evaluation of patients at risk for COVID-19 are in a state of rapid change based on information released by regulatory bodies including the CDC and federal and state organizations. These policies and algorithms were followed during the patient's care in the ED.  ____________________________________________   LABS (pertinent positives/negatives)  Labs Reviewed  COMPREHENSIVE METABOLIC PANEL - Abnormal; Notable for the following components:      Result Value   Glucose, Bld 120 (*)    BUN 33 (*)    Creatinine, Ser 2.22 (*)    Total Protein 8.6 (*)    GFR calc non Af Amer 35 (*)    GFR calc Af Amer 41 (*)    All other components within normal limits  CBC - Abnormal; Notable for the following components:   WBC 11.8 (*)    RBC 6.23 (*)    RDW 16.7 (*)    All other components within normal limits  LIPASE, BLOOD  URINALYSIS, COMPLETE (UACMP) WITH MICROSCOPIC    RADIOLOGY Images were viewed by me  CT the abdomen pelvis with contrast IMPRESSION:  1. Descending and sigmoid colon diverticulosis with acute  diverticulitis involving the distal descending colon at its junction  with the sigmoid colon. No evidence of perforation or abscess. (The  patient had acute diverticulitis in this same location in February,  2020 and in February, 2018).  2. Prostate gland enlargement, especially the median lobe, and the  fat planes surrounding the prostate gland are attenuated. Please  correlate with PSA.   Aortic Atherosclerosis (ICD10-I70.0).  ____________________________________________   DIFFERENTIAL DIAGNOSIS   Diverticulitis, perforation, abscess, renal colic, UTI, pyelonephritis  FINAL ASSESSMENT AND PLAN  Diverticulitis, AKI, hypertensive urgency   Plan: The patient had presented for left lower quadrant pain. Patient's labs did reveal some elevated BUN and creatinine indicating AKI as well as leukocytosis.  He was given IV fluids.  Patient's imaging did  reveal diverticulosis with acute diverticulitis of the distal descending colon without perforation or abscess in the same location as his previous diverticulitis.  I did order some IV Zosyn for him.  Patient was in severe pain on arrival and received Dilaudid for pain.  We also gave IV hydralazine for hypertension.  Given his acute kidney injury he would benefit from hospital observation.   Ulice Dash, MD    Note: This note was generated in part or whole with voice recognition software. Voice recognition is usually quite accurate but there are transcription errors that can and very often do occur. I apologize for any typographical errors that were not detected and corrected.     Emily Filbert, MD 12/08/19 [phone]

## 2019-12-08 NOTE — ED Triage Notes (Signed)
Pt c/o LLQ pain with N/V that started yesterday.. pt actively vomiting in triage.

## 2019-12-09 DIAGNOSIS — I161 Hypertensive emergency: Secondary | ICD-10-CM

## 2019-12-09 DIAGNOSIS — F1910 Other psychoactive substance abuse, uncomplicated: Secondary | ICD-10-CM

## 2019-12-09 LAB — CBC
HCT: 46.4 % (ref 39.0–52.0)
Hemoglobin: 15.9 g/dL (ref 13.0–17.0)
MCH: 27.1 pg (ref 26.0–34.0)
MCHC: 34.3 g/dL (ref 30.0–36.0)
MCV: 79.2 fL — ABNORMAL LOW (ref 80.0–100.0)
Platelets: 272 10*3/uL (ref 150–400)
RBC: 5.86 MIL/uL — ABNORMAL HIGH (ref 4.22–5.81)
RDW: 15.5 % (ref 11.5–15.5)
WBC: 10.2 10*3/uL (ref 4.0–10.5)
nRBC: 0 % (ref 0.0–0.2)

## 2019-12-09 LAB — BASIC METABOLIC PANEL
Anion gap: 11 (ref 5–15)
BUN: 27 mg/dL — ABNORMAL HIGH (ref 6–20)
CO2: 24 mmol/L (ref 22–32)
Calcium: 8.8 mg/dL — ABNORMAL LOW (ref 8.9–10.3)
Chloride: 104 mmol/L (ref 98–111)
Creatinine, Ser: 1.72 mg/dL — ABNORMAL HIGH (ref 0.61–1.24)
GFR calc Af Amer: 55 mL/min — ABNORMAL LOW (ref >60)
GFR calc non Af Amer: 48 mL/min — ABNORMAL LOW (ref >60)
Glucose, Bld: 120 mg/dL — ABNORMAL HIGH (ref 70–99)
Potassium: 3.2 mmol/L — ABNORMAL LOW (ref 3.5–5.1)
Sodium: 139 mmol/L (ref 135–145)

## 2019-12-09 LAB — HIV ANTIBODY (ROUTINE TESTING W REFLEX): HIV Screen 4th Generation wRfx: NONREACTIVE

## 2019-12-09 MED ORDER — POTASSIUM CHLORIDE CRYS ER 20 MEQ PO TBCR
40.0000 meq | EXTENDED_RELEASE_TABLET | Freq: Once | ORAL | Status: AC
  Administered 2019-12-09: 40 meq via ORAL
  Filled 2019-12-09: qty 2

## 2019-12-09 MED ORDER — METOPROLOL TARTRATE 25 MG PO TABS
25.0000 mg | ORAL_TABLET | Freq: Two times a day (BID) | ORAL | Status: DC
  Administered 2019-12-09 – 2019-12-10 (×2): 25 mg via ORAL
  Filled 2019-12-09 (×2): qty 1

## 2019-12-09 NOTE — ED Notes (Signed)
Pt sleeping quietly. Pt given meds as ordered. Lights out completely per pt request

## 2019-12-09 NOTE — ED Notes (Signed)
Attempted to call report, RN in another room, will call back.

## 2019-12-09 NOTE — ED Notes (Signed)
Pressure remains elevated, PRN medications administered, will reassess.

## 2019-12-09 NOTE — ED Notes (Signed)
Blood drawn and sent to lab.

## 2019-12-09 NOTE — ED Notes (Signed)
Pressure remains elevated despite treatment, Dr. Sherryll Burger aware and states ok to transport pt to floor with current pressure.

## 2019-12-09 NOTE — Progress Notes (Addendum)
[place] at [facility]   PATIENT NAME: Brad [name]    MR#:  [id number]  DATE OF BIRTH:  13-May-1976  SUBJECTIVE:  CHIEF COMPLAINT:   Chief Complaint  Patient presents with  . Abdominal Pain  pain much improved and so is BP, tachycardic REVIEW OF SYSTEMS:  Review of Systems  Constitutional: Negative for diaphoresis, fever, malaise/fatigue and weight loss.  HENT: Negative for ear discharge, ear pain, hearing loss, nosebleeds, sore throat and tinnitus.   Eyes: Negative for blurred vision and pain.  Respiratory: Negative for cough, hemoptysis, shortness of breath and wheezing.   Cardiovascular: Negative for chest pain, palpitations, orthopnea and leg swelling.  Gastrointestinal: Positive for abdominal pain. Negative for blood in stool, constipation, diarrhea, heartburn, nausea and vomiting.  Genitourinary: Negative for dysuria, frequency and urgency.  Musculoskeletal: Negative for back pain and myalgias.  Skin: Negative for itching and rash.  Neurological: Negative for dizziness, tingling, tremors, focal weakness, seizures, weakness and headaches.  Psychiatric/Behavioral: Negative for depression. The patient is not nervous/anxious.    DRUG ALLERGIES:  No Known Allergies VITALS:  Blood pressure (!) 159/94, pulse (!) 114, temperature 97.6 F (36.4 C), temperature source Oral, resp. rate 16, height 5\' 6"  (1.676 m), weight 117.9 kg, SpO2 97 %. PHYSICAL EXAMINATION:  Physical Exam HENT:     Head: Normocephalic and atraumatic.  Eyes:     Conjunctiva/sclera: Conjunctivae normal.     Pupils: Pupils are equal, round, and reactive to light.  Neck:     Thyroid: No thyromegaly.     Trachea: No tracheal deviation.  Cardiovascular:     Rate and Rhythm: Normal rate and regular rhythm.     Heart sounds: Normal heart sounds.  Pulmonary:     Effort: Pulmonary effort is normal. No respiratory distress.     Breath sounds: Normal breath sounds. No wheezing.  Chest:     Chest  wall: No tenderness.  Abdominal:     General: Bowel sounds are normal. There is no distension.     Palpations: Abdomen is soft.     Tenderness: There is generalized abdominal tenderness.  Musculoskeletal:        General: Normal range of motion.     Cervical back: Normal range of motion and neck supple.  Skin:    General: Skin is warm and dry.     Findings: No rash.  Neurological:     Mental Status: He is alert and oriented to person, place, and time.     Cranial Nerves: No cranial nerve deficit.    LABORATORY PANEL:  Male CBC Recent Labs  Lab 12/09/19 0508  WBC 10.2  HGB 15.9  HCT 46.4  PLT 272   ------------------------------------------------------------------------------------------------------------------ Chemistries  Recent Labs  Lab 12/08/19 1416 12/08/19 1416 12/09/19 0508  NA 139   < > 139  K 3.5   < > 3.2*  CL 101   < > 104  CO2 24   < > 24  GLUCOSE 120*   < > 120*  BUN 33*   < > 27*  CREATININE 2.22*   < > 1.72*  CALCIUM 9.6   < > 8.8*  AST 18  --   --   ALT 13  --   --   ALKPHOS 64  --   --   BILITOT 1.1  --   --    < > = values in this interval not displayed.   RADIOLOGY:  No results found. ASSESSMENT AND PLAN:  FrankRichmondis a43  y.o.malewith a known history of Hypertension, polysubstance abuse with cocaine and marijuana and tobacco abuse is being admitted for acute diverticulitis with symptoms of nausea vomiting abdominal pain.  *Acute diverticulitis Symptomatic treatment with IV pain and nausea medications. IV Cipro and Flagyl. - start diet and advance as tolerated  *Hypertensive urgency Related to medication noncompliance Continue hydralazine 100 mg 4 times a day & add metoprolol for better blood pressure/HR control Will hold HCTZ and lisinopril due to acute renal failure  *Acute renal failure Need to follow renal function after IV fluids. Hold HCTZ and lisinopril Monitor renal function. Creat 2.2->1.7  Polysubs abuse UDS  + for cocaine, marijuana and opiate  Status is: Inpatient  Remains inpatient appropriate because:Inpatient level of care appropriate due to severity of illness   Dispo: The patient is from: Home              Anticipated d/c is to: Home              Anticipated d/c date is: 1 day              Patient currently is not medically stable to d/c.    DVT prophylaxis: Heparin Family Communication: discussed with patient   All the records are reviewed and case discussed with Care Management/Social Worker. Management plans discussed with the patient, Nursing and they are in agreement.  CODE STATUS: Full Code  TOTAL TIME TAKING CARE OF THIS PATIENT: 35 minutes.   More than 50% of the time was spent in counseling/coordination of care: YES  POSSIBLE D/C IN 1 DAYS, DEPENDING ON CLINICAL CONDITION.   Max Sane M.D on 12/09/2019 at 7:10 PM  [facility]   CC: Primary care physician; Patient, No Pcp Per  Note: This dictation was prepared with Dragon dictation along with smaller phrase technology. Any transcriptional errors that result from this process are unintentional.

## 2019-12-09 NOTE — ED Notes (Signed)
Attempted to call report, concerns over pt's BP, noon medications administered, will reassess.

## 2019-12-09 NOTE — ED Notes (Signed)
Pt sleeping. Pharmacy messaged for missing meds.

## 2019-12-10 DIAGNOSIS — N179 Acute kidney failure, unspecified: Secondary | ICD-10-CM

## 2019-12-10 LAB — GLUCOSE, CAPILLARY: Glucose-Capillary: 129 mg/dL — ABNORMAL HIGH (ref 70–99)

## 2019-12-10 MED ORDER — METRONIDAZOLE 500 MG PO TABS: 500.0000 mg | ORAL_TABLET | Freq: Three times a day (TID) | ORAL | 0 refills | Status: AC

## 2019-12-10 MED ORDER — CIPROFLOXACIN HCL 500 MG PO TABS: 500.0000 mg | ORAL_TABLET | Freq: Two times a day (BID) | ORAL | 0 refills | Status: AC

## 2019-12-10 MED ORDER — METOPROLOL TARTRATE 50 MG PO TABS: 25.0000 mg | ORAL_TABLET | Freq: Two times a day (BID) | ORAL | 0 refills | Status: DC

## 2019-12-10 MED ORDER — LISINOPRIL 10 MG PO TABS: 10.0000 mg | ORAL_TABLET | Freq: Every day | ORAL | 0 refills | Status: DC

## 2019-12-10 NOTE — Discharge Summary (Signed)
5        [place] at [facility]   PATIENT NAME: Brad [name]    MR#:  [id number]  DATE OF BIRTH:  16-Jan-1976  DATE OF ADMISSION:  12/08/2019   ADMITTING PHYSICIAN: Delfino Lovett, MD  DATE OF DISCHARGE: 12/10/2019  1:27 PM  PRIMARY CARE PHYSICIAN: Patient, No Pcp Per   ADMISSION DIAGNOSIS:  Diverticulitis [K57.92] Hypertensive urgency [I16.0] AKI (acute kidney injury) (HCC) [N17.9] Acute diverticulitis [K57.92] DISCHARGE DIAGNOSIS:  Active Problems:   Hypertensive urgency   Diverticulitis   AKI (acute kidney injury) (HCC)  SECONDARY DIAGNOSIS:   Past Medical History:  Diagnosis Date  . Asthma   . Diverticulitis   . Hypertension    HOSPITAL COURSE:  FrankRichmondis a43y.o.malewith a known history ofHypertension, polysubstance abuse with cocaine and marijuana and tobacco abuseadmitted for acute diverticulitis with symptoms of nausea vomiting abdominal pain.  *Acute diverticulitis Treated with Cipro and Flagyl with good response.  Tolerating diet.  Pain much better controlled He is being discharged on oral Cipro and Flagyl.  *Hypertensive urgency Related to medication noncompliance Counseled for medication compliance  *Acute renalfailure Improving with hydration. creat 2.2->1.7 Patient does not want to stay as [birth year] his [birth year] tomorrow  Polysubs abuse UDS + for cocaine, marijuana and opiate  DISCHARGE CONDITIONS:  Stable CONSULTS OBTAINED:   DRUG ALLERGIES:  No Known Allergies DISCHARGE MEDICATIONS:   Allergies as of 12/10/2019   No Known Allergies     Medication List    STOP taking these medications   amLODipine 10 MG tablet Commonly known as: NORVASC   docusate sodium 100 MG capsule Commonly known as: COLACE   hydrALAZINE 50 MG tablet Commonly known as: APRESOLINE   hydrochlorothiazide 50 MG tablet Commonly known as: HYDRODIURIL     TAKE these medications   ciprofloxacin 500 MG tablet Commonly known as:  Cipro Take 1 tablet (500 mg total) by mouth 2 (two) times daily for 8 days.   lisinopril 10 MG tablet Commonly known as: ZESTRIL Take 1 tablet (10 mg total) by mouth daily.   metoprolol tartrate 50 MG tablet Commonly known as: LOPRESSOR Take 0.5 tablets (25 mg total) by mouth 2 (two) times daily.   metroNIDAZOLE 500 MG tablet Commonly known as: Flagyl Take 1 tablet (500 mg total) by mouth 3 (three) times daily for 8 days.      DISCHARGE INSTRUCTIONS:   DIET:  Regular diet DISCHARGE CONDITION:  Stable ACTIVITY:  Activity as tolerated OXYGEN:  Home Oxygen: No.  Oxygen Delivery: room air DISCHARGE LOCATION:  home   If you experience worsening of your admission symptoms, develop shortness of breath, life threatening emergency, suicidal or homicidal thoughts you must seek medical attention immediately by calling 911 or calling your MD immediately  if symptoms less severe.  You Must read complete instructions/literature along with all the possible adverse reactions/side effects for all the Medicines you take and that have been prescribed to you. Take any new Medicines after you have completely understood and accpet all the possible adverse reactions/side effects.   Please note  You were cared for by a hospitalist during your hospital stay. If you have any questions about your discharge medications or the care you received while you were in the hospital after you are discharged, you can call the unit and asked to speak with the hospitalist on call if the hospitalist that took care of you is not available. Once you are discharged, your primary care physician will handle any further medical  issues. Please note that NO REFILLS for any discharge medications will be authorized once you are discharged, as it is imperative that you return to your primary care physician (or establish a relationship with a primary care physician if you do not have one) for your aftercare needs so that they can  reassess your need for medications and monitor your lab values.    On the day of Discharge:  VITAL SIGNS:  Blood pressure (!) 171/79, pulse 94, temperature 98.3 F (36.8 C), temperature source Oral, resp. rate 16, height 5\' 6"  (1.676 m), weight 118.2 kg, SpO2 97 %. PHYSICAL EXAMINATION:  GENERAL:  44 y.o.-year-old patient lying in the bed with no acute distress.  EYES: Pupils equal, round, reactive to light and accommodation. No scleral icterus. Extraocular muscles intact.  HEENT: Head atraumatic, normocephalic. Oropharynx and nasopharynx clear.  NECK:  Supple, no jugular venous distention. No thyroid enlargement, no tenderness.  LUNGS: Normal breath sounds bilaterally, no wheezing, rales,rhonchi or crepitation. No use of accessory muscles of respiration.  CARDIOVASCULAR: S1, S2 normal. No murmurs, rubs, or gallops.  ABDOMEN: Soft, non-tender, non-distended. Bowel sounds present. No organomegaly or mass.  EXTREMITIES: No pedal edema, cyanosis, or clubbing.  NEUROLOGIC: Cranial nerves II through XII are intact. Muscle strength 5/5 in all extremities. Sensation intact. Gait not checked.  PSYCHIATRIC: The patient is alert and oriented x 3.  SKIN: No obvious rash, lesion, or ulcer.  DATA REVIEW:   CBC Recent Labs  Lab 12/09/19 0508  WBC 10.2  HGB 15.9  HCT 46.4  PLT 272    Chemistries  Recent Labs  Lab 12/08/19 1416 12/08/19 1416 12/09/19 0508  NA 139   < > 139  K 3.5   < > 3.2*  CL 101   < > 104  CO2 24   < > 24  GLUCOSE 120*   < > 120*  BUN 33*   < > 27*  CREATININE 2.22*   < > 1.72*  CALCIUM 9.6   < > 8.8*  AST 18  --   --   ALT 13  --   --   ALKPHOS 64  --   --   BILITOT 1.1  --   --    < > = values in this interval not displayed.     Outpatient follow-up Follow-up Information    Lin Landsman, MD. Schedule an appointment as soon as possible for a visit in 1 week(s).   Specialty: Gastroenterology Contact information: [place]  [id number] [phone]            Management plans discussed with the patient, family and they are in agreement.  CODE STATUS: Full Code   TOTAL TIME TAKING CARE OF THIS PATIENT: 45 minutes.    Max Sane M.D on 12/10/2019 at 4:11 PM  [facility]   CC: Primary care physician; Patient, No Pcp Per   Note: This dictation was prepared with Dragon dictation along with smaller phrase technology. Any transcriptional errors that result from this process are unintentional.

## 2019-12-10 NOTE — Discharge Instructions (Signed)

## 2019-12-10 NOTE — Progress Notes (Signed)
Brad [name] to be D/C'd Home per MD order.  Discussed prescriptions and follow up appointments with the patient. Prescriptions given to patient, medication list explained in detail. Pt verbalized understanding.  Allergies as of 12/10/2019   No Known Allergies     Medication List    STOP taking these medications   amLODipine 10 MG tablet Commonly known as: NORVASC   docusate sodium 100 MG capsule Commonly known as: COLACE   hydrALAZINE 50 MG tablet Commonly known as: APRESOLINE   hydrochlorothiazide 50 MG tablet Commonly known as: HYDRODIURIL     TAKE these medications   ciprofloxacin 500 MG tablet Commonly known as: Cipro Take 1 tablet (500 mg total) by mouth 2 (two) times daily for 8 days.   lisinopril 10 MG tablet Commonly known as: ZESTRIL Take 1 tablet (10 mg total) by mouth daily.   metoprolol tartrate 50 MG tablet Commonly known as: LOPRESSOR Take 0.5 tablets (25 mg total) by mouth 2 (two) times daily.   metroNIDAZOLE 500 MG tablet Commonly known as: Flagyl Take 1 tablet (500 mg total) by mouth 3 (three) times daily for 8 days.       Vitals:   12/10/19 0058 12/10/19 0419  BP: (!) 148/72 (!) 171/79  Pulse: (!) 110 94  Resp:  16  Temp:  98.3 F (36.8 C)  SpO2:  97%    Skin clean, dry and intact without evidence of skin break down, no evidence of skin tears noted. IV catheter discontinued intact. Site without signs and symptoms of complications. Dressing and pressure applied. Pt denies pain at this time. No complaints noted.  An After Visit Summary was printed and given to the patient. Patient escorted via WC, and D/C home via private auto.  Madie Reno, RN

## 2020-04-16 ENCOUNTER — Inpatient Hospital Stay
Admission: EM | Admit: 2020-04-16 | Discharge: 2020-04-19 | DRG: 177 | Disposition: A | Discharge status: Home / Self Care | Payer: HRSA Program | Attending: Internal Medicine | Admitting: Internal Medicine

## 2020-04-16 ENCOUNTER — Other Ambulatory Visit: Payer: Self-pay

## 2020-04-16 ENCOUNTER — Emergency Department: Payer: HRSA Program

## 2020-04-16 DIAGNOSIS — F1410 Cocaine abuse, uncomplicated: Secondary | ICD-10-CM | POA: Diagnosis present

## 2020-04-16 DIAGNOSIS — R7989 Other specified abnormal findings of blood chemistry: Secondary | ICD-10-CM

## 2020-04-16 DIAGNOSIS — J9601 Acute respiratory failure with hypoxia: Secondary | ICD-10-CM | POA: Diagnosis present

## 2020-04-16 DIAGNOSIS — R9431 Abnormal electrocardiogram [ECG] [EKG]: Secondary | ICD-10-CM

## 2020-04-16 DIAGNOSIS — I10 Essential (primary) hypertension: Secondary | ICD-10-CM

## 2020-04-16 DIAGNOSIS — Z79899 Other long term (current) drug therapy: Secondary | ICD-10-CM | POA: Diagnosis not present

## 2020-04-16 DIAGNOSIS — F17210 Nicotine dependence, cigarettes, uncomplicated: Secondary | ICD-10-CM | POA: Diagnosis present

## 2020-04-16 DIAGNOSIS — R778 Other specified abnormalities of plasma proteins: Secondary | ICD-10-CM | POA: Diagnosis present

## 2020-04-16 DIAGNOSIS — U071 COVID-19: Principal | ICD-10-CM | POA: Diagnosis present

## 2020-04-16 DIAGNOSIS — N1831 Chronic kidney disease, stage 3a: Secondary | ICD-10-CM | POA: Diagnosis present

## 2020-04-16 DIAGNOSIS — Z8249 Family history of ischemic heart disease and other diseases of the circulatory system: Secondary | ICD-10-CM

## 2020-04-16 DIAGNOSIS — I1310 Hypertensive heart and chronic kidney disease without heart failure, with stage 1 through stage 4 chronic kidney disease, or unspecified chronic kidney disease: Secondary | ICD-10-CM | POA: Diagnosis present

## 2020-04-16 DIAGNOSIS — F1290 Cannabis use, unspecified, uncomplicated: Secondary | ICD-10-CM | POA: Diagnosis present

## 2020-04-16 DIAGNOSIS — J1282 Pneumonia due to coronavirus disease 2019: Secondary | ICD-10-CM | POA: Diagnosis present

## 2020-04-16 DIAGNOSIS — J45909 Unspecified asthma, uncomplicated: Secondary | ICD-10-CM | POA: Diagnosis present

## 2020-04-16 DIAGNOSIS — R0902 Hypoxemia: Secondary | ICD-10-CM

## 2020-04-16 DIAGNOSIS — I160 Hypertensive urgency: Secondary | ICD-10-CM | POA: Diagnosis present

## 2020-04-16 DIAGNOSIS — N183 Chronic kidney disease, stage 3 unspecified: Secondary | ICD-10-CM

## 2020-04-16 DIAGNOSIS — I214 Non-ST elevation (NSTEMI) myocardial infarction: Secondary | ICD-10-CM

## 2020-04-16 LAB — COMPREHENSIVE METABOLIC PANEL
ALT: 12 U/L (ref 0–44)
AST: 25 U/L (ref 15–41)
Albumin: 4.1 g/dL (ref 3.5–5.0)
Alkaline Phosphatase: 58 U/L (ref 38–126)
Anion gap: 12 (ref 5–15)
BUN: 27 mg/dL — ABNORMAL HIGH (ref 6–20)
CO2: 26 mmol/L (ref 22–32)
Calcium: 9.2 mg/dL (ref 8.9–10.3)
Chloride: 98 mmol/L (ref 98–111)
Creatinine, Ser: 1.92 mg/dL — ABNORMAL HIGH (ref 0.61–1.24)
GFR calc Af Amer: 48 mL/min — ABNORMAL LOW (ref >60)
GFR calc non Af Amer: 41 mL/min — ABNORMAL LOW (ref >60)
Glucose, Bld: 109 mg/dL — ABNORMAL HIGH (ref 70–99)
Potassium: 3.8 mmol/L (ref 3.5–5.1)
Sodium: 136 mmol/L (ref 135–145)
Total Bilirubin: 1.1 mg/dL (ref 0.3–1.2)
Total Protein: 8.5 g/dL — ABNORMAL HIGH (ref 6.5–8.1)

## 2020-04-16 LAB — CBC WITH DIFFERENTIAL/PLATELET
Abs Immature Granulocytes: 0.02 10*3/uL (ref 0.00–0.07)
Basophils Absolute: 0 10*3/uL (ref 0.0–0.1)
Basophils Relative: 1 %
Eosinophils Absolute: 0 10*3/uL (ref 0.0–0.5)
Eosinophils Relative: 0 %
HCT: 46.2 % (ref 39.0–52.0)
Hemoglobin: 15.3 g/dL (ref 13.0–17.0)
Immature Granulocytes: 0 %
Lymphocytes Relative: 15 %
Lymphs Abs: 0.9 10*3/uL (ref 0.7–4.0)
MCH: 26.7 pg (ref 26.0–34.0)
MCHC: 33.1 g/dL (ref 30.0–36.0)
MCV: 80.6 fL (ref 80.0–100.0)
Monocytes Absolute: 0.9 10*3/uL (ref 0.1–1.0)
Monocytes Relative: 15 %
Neutro Abs: 4.1 10*3/uL (ref 1.7–7.7)
Neutrophils Relative %: 69 %
Platelets: 270 10*3/uL (ref 150–400)
RBC: 5.73 MIL/uL (ref 4.22–5.81)
RDW: 15.2 % (ref 11.5–15.5)
WBC: 5.9 10*3/uL (ref 4.0–10.5)
nRBC: 0 % (ref 0.0–0.2)

## 2020-04-16 LAB — TROPONIN I (HIGH SENSITIVITY)
Troponin I (High Sensitivity): 90 ng/L — ABNORMAL HIGH (ref <18)
Troponin I (High Sensitivity): 94 ng/L — ABNORMAL HIGH (ref <18)

## 2020-04-16 LAB — GLUCOSE, CAPILLARY: Glucose-Capillary: 237 mg/dL — ABNORMAL HIGH (ref 70–99)

## 2020-04-16 LAB — PROCALCITONIN: Procalcitonin: <0.1 ng/mL

## 2020-04-16 LAB — SARS CORONAVIRUS 2 BY RT PCR (HOSPITAL ORDER, PERFORMED IN ~~LOC~~ HOSPITAL LAB): SARS Coronavirus 2: POSITIVE — AB

## 2020-04-16 LAB — FIBRIN DERIVATIVES D-DIMER (ARMC ONLY): Fibrin derivatives D-dimer (ARMC): 793.12 ng/mL (FEU) — ABNORMAL HIGH (ref 0.00–499.00)

## 2020-04-16 LAB — FERRITIN: Ferritin: 125 ng/mL (ref 24–336)

## 2020-04-16 MED ORDER — SODIUM CHLORIDE 0.9% FLUSH
3.0000 mL | INTRAVENOUS | Status: DC | PRN
  Administered 2020-04-17 (×2): 3 mL via INTRAVENOUS

## 2020-04-16 MED ORDER — BARICITINIB 2 MG PO TABS
2.0000 mg | ORAL_TABLET | Freq: Every day | ORAL | Status: DC
  Filled 2020-04-16 (×2): qty 1

## 2020-04-16 MED ORDER — ENOXAPARIN SODIUM 60 MG/0.6ML ~~LOC~~ SOLN
0.5000 mg/kg | SUBCUTANEOUS | Status: DC
  Administered 2020-04-17 – 2020-04-18 (×2): 57.5 mg via SUBCUTANEOUS
  Filled 2020-04-16 (×5): qty 0.6

## 2020-04-16 MED ORDER — SODIUM CHLORIDE 0.9 % IV SOLN
500.0000 mg | INTRAVENOUS | Status: DC
  Administered 2020-04-17: 500 mg via INTRAVENOUS
  Filled 2020-04-16: qty 500

## 2020-04-16 MED ORDER — ACETAMINOPHEN 325 MG PO TABS: 650.0000 mg | ORAL_TABLET | Freq: Four times a day (QID) | ORAL | Status: DC | PRN

## 2020-04-16 MED ORDER — SODIUM CHLORIDE 0.9 % IV SOLN: 250.0000 mL | INTRAVENOUS | Status: DC | PRN

## 2020-04-16 MED ORDER — ASPIRIN EC 325 MG PO TBEC
325.0000 mg | DELAYED_RELEASE_TABLET | Freq: Every day | ORAL | Status: DC
  Administered 2020-04-16 – 2020-04-19 (×4): 325 mg via ORAL
  Filled 2020-04-16 (×4): qty 1

## 2020-04-16 MED ORDER — AMLODIPINE BESYLATE 5 MG PO TABS
5.0000 mg | ORAL_TABLET | Freq: Every day | ORAL | Status: DC
  Administered 2020-04-16 – 2020-04-17 (×2): 5 mg via ORAL
  Filled 2020-04-16 (×2): qty 1

## 2020-04-16 MED ORDER — SODIUM CHLORIDE 0.9 % IV SOLN
100.0000 mg | Freq: Every day | INTRAVENOUS | Status: DC
  Administered 2020-04-17 – 2020-04-19 (×3): 100 mg via INTRAVENOUS
  Filled 2020-04-16: qty 20
  Filled 2020-04-16 (×2): qty 100
  Filled 2020-04-16: qty 20

## 2020-04-16 MED ORDER — HYDRALAZINE HCL 20 MG/ML IJ SOLN
10.0000 mg | Freq: Four times a day (QID) | INTRAMUSCULAR | Status: DC | PRN
  Administered 2020-04-17 – 2020-04-19 (×5): 10 mg via INTRAVENOUS
  Filled 2020-04-16 (×6): qty 1

## 2020-04-16 MED ORDER — ACETAMINOPHEN 500 MG PO TABS
ORAL_TABLET | ORAL | Status: AC
  Administered 2020-04-16: 1000 mg via ORAL
  Filled 2020-04-16: qty 2

## 2020-04-16 MED ORDER — SODIUM CHLORIDE 0.9% FLUSH
3.0000 mL | Freq: Two times a day (BID) | INTRAVENOUS | Status: DC
  Administered 2020-04-16 – 2020-04-19 (×3): 3 mL via INTRAVENOUS

## 2020-04-16 MED ORDER — SODIUM CHLORIDE 0.9 % IV SOLN
2.0000 g | INTRAVENOUS | Status: DC
  Administered 2020-04-16: 2 g via INTRAVENOUS
  Filled 2020-04-16: qty 20

## 2020-04-16 MED ORDER — ZINC SULFATE 220 (50 ZN) MG PO CAPS
220.0000 mg | ORAL_CAPSULE | Freq: Every day | ORAL | Status: DC
  Administered 2020-04-16 – 2020-04-19 (×4): 220 mg via ORAL
  Filled 2020-04-16 (×4): qty 1

## 2020-04-16 MED ORDER — POLYETHYLENE GLYCOL 3350 17 G PO PACK: 17.0000 g | PACK | Freq: Every day | ORAL | Status: DC | PRN

## 2020-04-16 MED ORDER — ASCORBIC ACID 500 MG PO TABS
500.0000 mg | ORAL_TABLET | Freq: Every day | ORAL | Status: DC
  Administered 2020-04-16 – 2020-04-19 (×4): 500 mg via ORAL
  Filled 2020-04-16 (×4): qty 1

## 2020-04-16 MED ORDER — DEXAMETHASONE SODIUM PHOSPHATE 10 MG/ML IJ SOLN
10.0000 mg | Freq: Once | INTRAMUSCULAR | Status: AC
  Administered 2020-04-16: 10 mg via INTRAVENOUS
  Filled 2020-04-16: qty 1

## 2020-04-16 MED ORDER — INSULIN ASPART 100 UNIT/ML ~~LOC~~ SOLN
0.0000 [IU] | Freq: Three times a day (TID) | SUBCUTANEOUS | Status: DC
  Administered 2020-04-17 (×2): 2 [IU] via SUBCUTANEOUS
  Administered 2020-04-18 (×2): 1 [IU] via SUBCUTANEOUS
  Administered 2020-04-18 – 2020-04-19 (×2): 2 [IU] via SUBCUTANEOUS
  Filled 2020-04-16 (×5): qty 1

## 2020-04-16 MED ORDER — SODIUM CHLORIDE 0.9% FLUSH
3.0000 mL | Freq: Two times a day (BID) | INTRAVENOUS | Status: DC
  Administered 2020-04-16 – 2020-04-19 (×5): 3 mL via INTRAVENOUS

## 2020-04-16 MED ORDER — SODIUM CHLORIDE 0.9 % IV SOLN
200.0000 mg | Freq: Once | INTRAVENOUS | Status: AC
  Administered 2020-04-16: 200 mg via INTRAVENOUS
  Filled 2020-04-16: qty 200

## 2020-04-16 MED ORDER — METHYLPREDNISOLONE SODIUM SUCC 125 MG IJ SOLR
80.0000 mg | Freq: Two times a day (BID) | INTRAMUSCULAR | Status: DC
  Administered 2020-04-17 – 2020-04-18 (×4): 80 mg via INTRAVENOUS
  Filled 2020-04-16 (×4): qty 2

## 2020-04-16 MED ORDER — GUAIFENESIN-DM 100-10 MG/5ML PO SYRP
10.0000 mL | ORAL_SOLUTION | ORAL | Status: DC | PRN
  Filled 2020-04-16: qty 10

## 2020-04-16 MED ORDER — ACETAMINOPHEN 500 MG PO TABS: 1000.0000 mg | ORAL_TABLET | Freq: Once | ORAL | Status: DC

## 2020-04-16 MED ORDER — HYDROCOD POLST-CPM POLST ER 10-8 MG/5ML PO SUER: 5.0000 mL | Freq: Two times a day (BID) | ORAL | Status: DC | PRN

## 2020-04-16 NOTE — Assessment & Plan Note (Addendum)
--  Appears a little better today --8/23 CXR right base infiltrate --oxygen 4L --inflammatory markers . Ferritin 125 > 114 . CRP pending . Fibrin derivatives 793 > 2096  --Tx . Remdesiver 8/23 >  . Steroids 8/23 > . Baricitinim 8/23 > . Will stop ceftriaxone/azithromycin w/ negative procalcitonin

## 2020-04-16 NOTE — Assessment & Plan Note (Addendum)
--  asymptomatic, appears to be rather chronic untreated HTN. Renal function appears to be baseline

## 2020-04-16 NOTE — Assessment & Plan Note (Signed)
--  CKD stage IIIa --Probably at baseline.  Secondary to hypertensive heart disease.

## 2020-04-16 NOTE — Assessment & Plan Note (Addendum)
--  Started Norvasc on admit --Add hydralazine

## 2020-04-16 NOTE — ED Notes (Signed)
Pt called from main lobby without response, pt not found in flex waiting room

## 2020-04-16 NOTE — ED Triage Notes (Signed)
Pt c/o cough with congestion fever and bodyaches for the past 2 days

## 2020-04-16 NOTE — Assessment & Plan Note (Signed)
--  plan as above 

## 2020-04-16 NOTE — Assessment & Plan Note (Addendum)
--  No signs or symptoms to suggest ACS. ACS ruled out. Troponin mildly elevated, stable. Elevation secondary to hypertensive heart disease, acute illness.  EKG abnormal but appears to be chronic.

## 2020-04-16 NOTE — H&P (Addendum)
History and Physical  SWAN ZAYED OEU:[id number] DOB: 09-Jul-1976 DOA: 04/16/2020  PCP: Patient, No Pcp Per   Chief Complaint: short of breath  HPI:  44 year old man PMH hypertension not on medications presented with shortness of breath.  Admitted for acute hypoxic respiratory failure secondary to Covid pneumonia.  Patient became sick in the last 2 days with shortness of breath, fever, chills at home.  No specific aggravating or alleviating factors.  Associated symptoms some cough.  No previous infection with Covid.  No vaccination.  Came to the emergency department for increasing shortness of breath.  Chart review: . April 2021 treated for acute diverticulitis  ED Course: Treated with Decadron, placed on oxygen  Review of Systems:  Positive for fever, negative for visual changes, sore throat, rash, new muscle aches, chest pain, dysuria, bleeding, nausea, vomiting, abdominal pain  PMH . Essential hypertension . Diverticulitis . Asthma  PSH . Ankle surgery  Family history includes: . Mother with hypertension  Social History . Occasional alcohol. . Half pack per day smoker . Daily marijuana use  Allergies . None  Meds include: . Not taking any  Physicial Exam   Vitals:  . 100.1, 209/119, 93, 89% on RA  Constitutional:   . Appears calm and comfortable, tachypneic, ill, nontoxic Eyes:  . pupils and irises appear normal . Normal lids ENMT:  . grossly normal hearing  . Lips appear normal Neck:  . neck appears normal, no masses . no thyromegaly Respiratory:  . CTA bilaterally, no w/r/r.  . Respiratory effort moderately increased.  Tachypneic.  Able to speak in full sentences. Cardiovascular:  . RRR, no m/r/g . No LE extremity edema   Abdomen:  . Abdomen appears normal; no tenderness or masses . No hernias  Musculoskeletal:  . Digits/nails BUE: no clubbing, cyanosis, petechiae, infection .  RUE, LUE, RLE, LLE   . Tone grossly normal Skin:  . No  rashes, lesions, ulcers . palpation of skin: no induration or nodules Psychiatric:  . Mental status o Mood, affect appropriate  I have personally reviewed following labs and imaging studies  Labs:  Marland Kitchen Creatinine 1.92 remainder CMP unremarkable . High-sensitivity troponin 90 . CBC unremarkable . SARS-CoV-2 positive  Imaging studies:   Chest x-ray mild right lower lobe pneumonia.  Independently reviewed.  Medical tests:   EKG independently reviewed: Normal sinus rhythm, LVH with repolarization abnormalities, compared to previous studies June 2017  ASSESSMENT/PLAN   Acute hypoxemic respiratory failure due to COVID-19 [facility]) --Appears ill, tachypneic with increased respiratory effort.  Inflammatory markers pending.  Based on his clinical status I believe he is at high risk for decompensation.  Discussed emergency use authorization for baricitinib, patient agreed to proceed --CXR right base infiltrate --oxygen 2L --inflammatory markers . Ferritin pending . CRP pending . Fibrin derivatives 793 --Tx . Remdesiver 8/23 >  . Steroids 8/23 > . baricitinim 8/23 > . Will add ceftriaxone/azithromycin pending procalcitonin given dominant infiltrate RLL    Pneumonia due to COVID-19 virus --plan as above  Hypertensive urgency --resolved, renal function appears to be baseline and patient was asymptomatic.  Suspect chronic untreated hypertension with associated chronic kidney disease.  Benign essential HTN --Start Norvasc, diuretic --Gradual reduction  CKD (chronic kidney disease), stage III --CKD stage IIIa --Probably at baseline.  Secondary to hypertensive heart disease.  Elevated troponin --No signs or symptoms to suggest ACS.  Elevation probably related to hypertensive heart disease, acute illness.  EKG abnormal but appears to be chronic.  We will check  an additional troponin.  Follow clinically.  DVT prophylaxis: enoxaparin Code Status: Full Family Communication:  none Consults called: none    Time spent: 60 minutes  Brendia Sacks, MD  [facility] Direct contact: see [email or web address]  7PM-7AM contact night coverage as below   1. Check the care team in [facility] and look for a) attending/consulting [facility] provider listed and b) the [facility] team listed 2. Log into [email or web address] and use [place]'s universal password to access. If you do not have the password, please contact the hospital operator. 3. Locate the [facility] provider you are looking for under [facility] and page to a number that you can be directly reached. 4. If you still have difficulty reaching the provider, please page the [facility] (Director on Call) for the Hospitalists listed on amion for assistance.  Severity of Illness: The appropriate patient status for this patient is INPATIENT. Inpatient status is judged to be reasonable and necessary in order to provide the required intensity of service to ensure the patient's safety. The patient's presenting symptoms, physical exam findings, and initial radiographic and laboratory data in the context of their chronic comorbidities is felt to place them at high risk for further clinical deterioration. Furthermore, it is not anticipated that the patient will be medically stable for discharge from the hospital within 2 midnights of admission. The following factors support the patient status of inpatient.   " The patient's presenting symptoms include shortness of breath. " The worrisome physical exam findings include tachypnea. " The initial radiographic and laboratory data are worrisome because of pneumonia, elevated fibrin derivative. " The chronic co-morbidities include HTN heart disease, CKD stage IIIa.   * I certify that at the point of admission it is my clinical judgment that the patient will require inpatient hospital care spanning beyond 2 midnights from the point of admission due to high intensity of service, high risk for further deterioration and  high frequency of surveillance required.*   Status is: Inpatient  Remains inpatient appropriate because:Inpatient level of care appropriate due to severity of illness   Dispo: The patient is from: Home              Anticipated d/c is to: Home              Anticipated d/c date is: 3 days              Patient currently is not medically stable to d/c.   04/16/2020, 6:35 PM   Principal Problem:   Acute hypoxemic respiratory failure due to COVID-19 [facility]) Active Problems:   Pneumonia due to COVID-19 virus   Hypertensive urgency   Benign essential HTN   CKD (chronic kidney disease), stage III   Elevated troponin

## 2020-04-16 NOTE — ED Provider Notes (Signed)
I was asked to interpret this patient's EKG.  Per discussion with provider and review of records patient is presenting with shortness of breath, tachycardia, hypertension, tachypnea, and is found to be Covid positive.  I did speak with the patient myself and he denies any active chest pain.  EKG obtained does show concerning ST elevations in V2, V3, and V4 with nonspecific T wave inversions in leads I and 6 and aVL.  In addition patient is noted to have a troponin of 90.  Given ischemic EKG findings with elevated troponin recommended patient's provider reach out to interventional cardiology who did not recommend any emergent intervention but recommended treating patient's blood pressure and giving ASA.  Patient will be admitted to hospital service for further evaluation management.  Medications  acetaminophen (TYLENOL) 500 MG tablet (has no administration in time range)  aspirin EC tablet 325 mg (has no administration in time range)  enoxaparin (LOVENOX) injection 57.5 mg (has no administration in time range)  sodium chloride flush (NS) 0.9 % injection 3 mL (has no administration in time range)  sodium chloride flush (NS) 0.9 % injection 3 mL (has no administration in time range)  sodium chloride flush (NS) 0.9 % injection 3 mL (has no administration in time range)  0.9 %  sodium chloride infusion (has no administration in time range)  remdesivir 200 mg in sodium chloride 0.9% 250 mL IVPB (has no administration in time range)    Followed by  remdesivir 100 mg in sodium chloride 0.9 % 100 mL IVPB (has no administration in time range)  guaiFENesin-dextromethorphan (ROBITUSSIN DM) 100-10 MG/5ML syrup 10 mL (has no administration in time range)  chlorpheniramine-HYDROcodone (TUSSIONEX) 10-8 MG/5ML suspension 5 mL (has no administration in time range)  insulin aspart (novoLOG) injection 0-9 Units (has no administration in time range)  ascorbic acid (VITAMIN C) tablet 500 mg (has no administration in time  range)  zinc sulfate capsule 220 mg (has no administration in time range)  acetaminophen (TYLENOL) tablet 650 mg (has no administration in time range)  polyethylene glycol (MIRALAX / GLYCOLAX) packet 17 g (has no administration in time range)  baricitinib (OLUMIANT) tablet 2 mg (has no administration in time range)  dexamethasone (DECADRON) injection 10 mg (10 mg Intravenous Given 04/16/20 1540)      Gilles Chiquito, MD 04/16/20 [phone]

## 2020-04-16 NOTE — ED Provider Notes (Addendum)
[facility] Emergency Department Provider Note  ____________________________________________   First MD Initiated Contact with Patient 04/16/20 1239     (approximate)  I have reviewed the triage vital signs and the nursing notes.   HISTORY  Chief Complaint URI    HPI Brad [name] is a 44 y.o. male presents emergency department complaining of congestion, fever and body aches for 2 days.  Patient states he has not felt short of breath.  States he always has high blood pressure.  Is not taking any medication for it at this time.  Does not have a family doctor.  Patient is just nodding his head and answers because he wants to go to sleep.    Past Medical History:  Diagnosis Date  . Asthma   . Diverticulitis   . Hypertension     Patient Active Problem List   Diagnosis Date Noted  . Pneumonia due to COVID-19 virus 04/16/2020  . AKI (acute kidney injury) (HCC)   . Diverticulitis 10/06/2018  . Hypertensive urgency 04/18/2016  . Intractable nausea and vomiting 05/31/2015    Past Surgical History:  Procedure Laterality Date  . ANKLE SURGERY      Prior to Admission medications   Medication Sig Start Date End Date Taking? Authorizing Provider  lisinopril (ZESTRIL) 10 MG tablet Take 1 tablet (10 mg total) by mouth daily. 12/10/19  Yes Delfino Lovett, MD  metoprolol tartrate (LOPRESSOR) 50 MG tablet Take 0.5 tablets (25 mg total) by mouth 2 (two) times daily. 12/10/19 04/16/20 Yes Delfino Lovett, MD    Allergies Patient has no known allergies.  Family History  Problem Relation Age of Onset  . Hypertension Mother     Social History Social History   Tobacco Use  . Smoking status: Current Every Day Smoker    Packs/day: 0.50    Types: Cigarettes  . Smokeless tobacco: Never Used  Substance Use Topics  . Alcohol use: Yes    Comment: occas  . Drug use: Yes    Types: Marijuana    Comment: daily    Review of Systems  Constitutional: Positive  fever/chills Eyes: No visual changes. ENT: No sore throat. Respiratory: Positive cough Cardiovascular: Denies chest pain Gastrointestinal: Denies abdominal pain Genitourinary: Negative for dysuria. Musculoskeletal: Negative for back pain. Skin: Negative for rash. Psychiatric: no mood changes,     ____________________________________________   PHYSICAL EXAM:  VITAL SIGNS: ED Triage Vitals  Enc Vitals Group     BP 04/16/20 1114 (!) 209/119     Pulse Rate 04/16/20 1114 84     Resp 04/16/20 1114 18     Temp 04/16/20 1114 100.1 F (37.8 C)     Temp Source 04/16/20 1114 Oral     SpO2 04/16/20 1114 93 %     Weight 04/16/20 1115 255 lb (115.7 kg)     Height 04/16/20 1115 5\' 6"  (1.676 m)     Head Circumference --      Peak Flow --      Pain Score 04/16/20 1114 10     Pain Loc --      Pain Edu? --      Excl. in GC? --     Constitutional: Alert and oriented. Well appearing and in no acute distress. Eyes: Conjunctivae are normal.  Head: Atraumatic. Nose: No congestion/rhinnorhea. Mouth/Throat: Mucous membranes are moist.   Neck:  supple no lymphadenopathy noted Cardiovascular: Normal rate, regular rhythm. Heart sounds are normal Respiratory: Normal respiratory effort.  No retractions,  lungs c t a  GU: deferred Musculoskeletal: FROM all extremities, warm and well perfused Neurologic:  Normal speech and language.  Skin:  Skin is warm, dry and intact. No rash noted. Psychiatric: Mood and affect are normal. Speech and behavior are normal.  ____________________________________________   LABS (all labs ordered are listed, but only abnormal results are displayed)  Labs Reviewed  SARS CORONAVIRUS 2 BY RT PCR (HOSPITAL ORDER, PERFORMED IN [facility]) - Abnormal; Notable for the following components:      Result Value   SARS Coronavirus 2 POSITIVE (*)    All other components within normal limits  COMPREHENSIVE METABOLIC PANEL - Abnormal; Notable for the following  components:   Glucose, Bld 109 (*)    BUN 27 (*)    Creatinine, Ser 1.92 (*)    Total Protein 8.5 (*)    GFR calc non Af Amer 41 (*)    GFR calc Af Amer 48 (*)    All other components within normal limits  FIBRIN DERIVATIVES D-DIMER ([facility] ONLY) - Abnormal; Notable for the following components:   Fibrin derivatives D-dimer ([facility]) 793.12 (*)    All other components within normal limits  TROPONIN I (HIGH SENSITIVITY) - Abnormal; Notable for the following components:   Troponin I (High Sensitivity) 90 (*)    All other components within normal limits  CBC WITH DIFFERENTIAL/PLATELET  CBC WITH DIFFERENTIAL/PLATELET  COMPREHENSIVE METABOLIC PANEL  C-REACTIVE PROTEIN  FIBRIN DERIVATIVES D-DIMER ([facility] ONLY)  FERRITIN  MAGNESIUM  PHOSPHORUS  FERRITIN  C-REACTIVE PROTEIN   ____________________________________________   ____________________________________________  RADIOLOGY  Chest x-ray shows multifocal pneumonia  ____________________________________________   PROCEDURES  Procedure(s) performed: No  Procedures    ____________________________________________   INITIAL IMPRESSION / ASSESSMENT AND PLAN / ED COURSE  Pertinent labs & imaging results that were available during my care of the patient were reviewed by me and considered in my medical decision making (see chart for details).   The patient is a 44 year old male presents emergency department with history of hypertension who is currently noncompliant and is complaining of cough, congestion, fever.  See HPI  Physical exam shows patient to appear stable.  Vitals are normal except for the elevated blood pressure.  Oxygen saturation in the exam room is at 100% on room air, remainder the exam is unremarkable  Chest x-ray shows multifocal pneumonia, Covid test ordered   Covid test is positive,   ----------------------------------------- 4:46 PM on 04/16/2020 -----------------------------------------  Patient's O2  saturation has dropped to 89% and seems to stay around 92% with effort.  I do feel the patient would benefit from admission.  His chest x-ray showed a Covid multifocal pneumonia, Covid test is positive, fibrin derivatives elevated at 793.12, troponin is elevated at 90, comprehensive metabolic panel shows elevation in his BUN and creatinine and decreased kidney function.  However this may be due to his chronic hypertension that he is noncompliant with.  Also CBC is normal  I did discuss the findings with Dr. Erma Heritage.  Do agree that he needs to be admitted.  Paged the hospitalist   EKG ordered.  Oxygen ordered on 2 L nasal cannula Tylenol ordered as patient appears to be febrile and sweaty at this time  ----------------------------------------- 5:49 PM on 04/16/2020 -----------------------------------------  Hospitalist to see the patient  ----------------------------------------- 6:15 PM on 04/16/2020 -----------------------------------------  Dr. Antoine Primas concerned about the EKG as it has some T wave abnormality.  I did page the cardiologist.  Dr. Tenny Craw looked at  his baseline and states due to his chronic LVH, states to do ASA and control blood pressure.  I did convey this on to the hospitalist as he is already seen the patient and evaluated him.     Brad [name] was evaluated in Emergency Department on 04/16/2020 for the symptoms described in the history of present illness. He was evaluated in the context of the global COVID-19 pandemic, which necessitated consideration that the patient might be at risk for infection with the SARS-CoV-2 virus that causes COVID-19. Institutional protocols and algorithms that pertain to the evaluation of patients at risk for COVID-19 are in a state of rapid change based on information released by regulatory bodies including the CDC and federal and state organizations. These policies and algorithms were followed during the patient's care in the ED.    As  part of my medical decision making, I reviewed the following data within the electronic MEDICAL RECORD NUMBER Nursing notes reviewed and incorporated, Labs reviewed , EKG interpreted see physician read, Old chart reviewed, Radiograph reviewed , Discussed with admitting physician , consult to cardiology,  Notes from prior ED visits and [place] Controlled Substance Database  ____________________________________________   FINAL CLINICAL IMPRESSION(S) / ED DIAGNOSES  Final diagnoses:  Pneumonia due to COVID-19 virus  Hypoxia  Abnormal EKG  Essential hypertension      NEW MEDICATIONS STARTED DURING THIS VISIT:  New Prescriptions   No medications on file     Note:  This document was prepared using Dragon voice recognition software and may include unintentional dictation errors.    Faythe Ghee, PA-C 04/16/20 1750    Sherrie Mustache Roselyn Bering, PA-C 04/16/20 Wandra Mannan, MD 04/18/20 [phone]

## 2020-04-17 DIAGNOSIS — F1410 Cocaine abuse, uncomplicated: Secondary | ICD-10-CM | POA: Diagnosis present

## 2020-04-17 LAB — CBC WITH DIFFERENTIAL/PLATELET
Abs Immature Granulocytes: 0.01 10*3/uL (ref 0.00–0.07)
Basophils Absolute: 0 10*3/uL (ref 0.0–0.1)
Basophils Relative: 1 %
Eosinophils Absolute: 0 10*3/uL (ref 0.0–0.5)
Eosinophils Relative: 0 %
HCT: 45.1 % (ref 39.0–52.0)
Hemoglobin: 14.7 g/dL (ref 13.0–17.0)
Immature Granulocytes: 0 %
Lymphocytes Relative: 16 %
Lymphs Abs: 0.7 10*3/uL (ref 0.7–4.0)
MCH: 27.1 pg (ref 26.0–34.0)
MCHC: 32.6 g/dL (ref 30.0–36.0)
MCV: 83.2 fL (ref 80.0–100.0)
Monocytes Absolute: 0.5 10*3/uL (ref 0.1–1.0)
Monocytes Relative: 12 %
Neutro Abs: 2.9 10*3/uL (ref 1.7–7.7)
Neutrophils Relative %: 71 %
Platelets: 254 10*3/uL (ref 150–400)
RBC: 5.42 MIL/uL (ref 4.22–5.81)
RDW: 15 % (ref 11.5–15.5)
WBC: 4 10*3/uL (ref 4.0–10.5)
nRBC: 0 % (ref 0.0–0.2)

## 2020-04-17 LAB — GLUCOSE, CAPILLARY
Glucose-Capillary: 120 mg/dL — ABNORMAL HIGH (ref 70–99)
Glucose-Capillary: 157 mg/dL — ABNORMAL HIGH (ref 70–99)
Glucose-Capillary: 192 mg/dL — ABNORMAL HIGH (ref 70–99)

## 2020-04-17 LAB — COMPREHENSIVE METABOLIC PANEL
ALT: 12 U/L (ref 0–44)
AST: 18 U/L (ref 15–41)
Albumin: 3.5 g/dL (ref 3.5–5.0)
Alkaline Phosphatase: 55 U/L (ref 38–126)
Anion gap: 13 (ref 5–15)
BUN: 31 mg/dL — ABNORMAL HIGH (ref 6–20)
CO2: 24 mmol/L (ref 22–32)
Calcium: 8.7 mg/dL — ABNORMAL LOW (ref 8.9–10.3)
Chloride: 102 mmol/L (ref 98–111)
Creatinine, Ser: 1.66 mg/dL — ABNORMAL HIGH (ref 0.61–1.24)
GFR calc Af Amer: 57 mL/min — ABNORMAL LOW (ref >60)
GFR calc non Af Amer: 49 mL/min — ABNORMAL LOW (ref >60)
Glucose, Bld: 127 mg/dL — ABNORMAL HIGH (ref 70–99)
Potassium: 3.8 mmol/L (ref 3.5–5.1)
Sodium: 139 mmol/L (ref 135–145)
Total Bilirubin: 0.7 mg/dL (ref 0.3–1.2)
Total Protein: 7.6 g/dL (ref 6.5–8.1)

## 2020-04-17 LAB — URINE DRUG SCREEN, QUALITATIVE (ARMC ONLY)
Amphetamines, Ur Screen: NOT DETECTED
Barbiturates, Ur Screen: NOT DETECTED
Benzodiazepine, Ur Scrn: NOT DETECTED
Cannabinoid 50 Ng, Ur ~~LOC~~: POSITIVE — AB
Cocaine Metabolite,Ur ~~LOC~~: POSITIVE — AB
MDMA (Ecstasy)Ur Screen: NOT DETECTED
Methadone Scn, Ur: NOT DETECTED
Opiate, Ur Screen: NOT DETECTED
Phencyclidine (PCP) Ur S: NOT DETECTED
Tricyclic, Ur Screen: NOT DETECTED

## 2020-04-17 LAB — MAGNESIUM: Magnesium: 2.1 mg/dL (ref 1.7–2.4)

## 2020-04-17 LAB — FIBRIN DERIVATIVES D-DIMER (ARMC ONLY): Fibrin derivatives D-dimer (ARMC): 2096.78 ng/mL (FEU) — ABNORMAL HIGH (ref 0.00–499.00)

## 2020-04-17 LAB — C-REACTIVE PROTEIN
CRP: 2.2 mg/dL — ABNORMAL HIGH (ref <1.0)
CRP: 2.6 mg/dL — ABNORMAL HIGH (ref <1.0)

## 2020-04-17 LAB — PHOSPHORUS: Phosphorus: 2.9 mg/dL (ref 2.5–4.6)

## 2020-04-17 LAB — FERRITIN: Ferritin: 114 ng/mL (ref 24–336)

## 2020-04-17 MED ORDER — HYDRALAZINE HCL 10 MG PO TABS
10.0000 mg | ORAL_TABLET | Freq: Four times a day (QID) | ORAL | Status: DC
  Administered 2020-04-17 – 2020-04-18 (×4): 10 mg via ORAL
  Filled 2020-04-17 (×7): qty 1

## 2020-04-17 MED ORDER — BARICITINIB 1 MG PO TABS
4.0000 mg | ORAL_TABLET | Freq: Every day | ORAL | Status: DC
  Administered 2020-04-17: 4 mg via ORAL
  Filled 2020-04-17: qty 4
  Filled 2020-04-17: qty 2

## 2020-04-17 NOTE — Assessment & Plan Note (Signed)
--  avoid BB --counseled on cessation

## 2020-04-17 NOTE — Progress Notes (Signed)
PROGRESS NOTE  Brad [name] QMV:[id number] DOB: September 18, 1975 DOA: 04/16/2020 PCP: Patient, No Pcp Per  Brief History   44 year old man PMH hypertension not on medications presented with shortness of breath.  Admitted for acute hypoxic respiratory failure secondary to Covid pneumonia. Started on remdesivir, solumedrol and baricitinib.    A & P  Acute hypoxemic respiratory failure due to COVID-19 [facility]) --Appears a little better today --8/23 CXR right base infiltrate --oxygen 4L --inflammatory markers . Ferritin 125 > 114 . CRP pending . Fibrin derivatives 793 > 2096  --Tx . Remdesiver 8/23 >  . Steroids 8/23 > . Baricitinim 8/23 > . Will stop ceftriaxone/azithromycin w/ negative procalcitonin   Pneumonia due to COVID-19 virus --plan as above  Hypertensive urgency --asymptomatic, appears to be rather chronic untreated HTN. Renal function appears to be baseline   Benign essential HTN --Started Norvasc on admit --Add hydralazine  CKD (chronic kidney disease), stage III --CKD stage IIIa --Probably at baseline.  Secondary to hypertensive heart disease.  Elevated troponin --No signs or symptoms to suggest ACS. ACS ruled out. Troponin mildly elevated, stable. Elevation secondary to hypertensive heart disease, acute illness.  EKG abnormal but appears to be chronic.    Cocaine abuse (HCC) --avoid BB --counseled on cessation  Disposition Plan:  Discussion: appears better; subjectively better but Ox up to 4L though sats are high. Wean oxygen as tolerated; prone; continue current Rx  Status is: Inpatient  Remains inpatient appropriate because:IV treatments appropriate due to intensity of illness or inability to take PO   Dispo: The patient is from: Home              Anticipated d/c is to: Home              Anticipated d/c date is: 3 days              Patient currently is not medically stable to d/c.  DVT prophylaxis: enoxaparin  Code Status: Full Family Communication:  none  Brendia Sacks, MD  [facility] Direct contact: see [email or web address] (further directions at bottom of note if needed) 7PM-7AM contact night coverage as at bottom of note 04/17/2020, 10:36 AM  LOS: 1 day     Consults:  .    Procedures:  .   Significant Diagnostic Tests:  Marland Kitchen    Micro Data:  .    Antimicrobials:  .   Interval History/Subjective  Feels a little better, breathing a little better. Still SOB. Eating ok. Does admit to recent cocaine use.  Objective   Vitals:  Vitals:   04/17/20 0730 04/17/20 0900  BP: (!) 196/147 (!) 186/142  Pulse: 71 86  Resp:  20  Temp:    SpO2: 97% 96%    Exam:  Constitutional:   . Appears calm and comfortable ENMT:  . grossly normal hearing  Respiratory:  . CTA bilaterally, no w/r/r.  . Respiratory effort mildly increased; tachypneic.  Cardiovascular:  . RRR, no m/r/g . No LE extremity edema   Abdomen:  . soft Psychiatric:  . Mental status o Mood, affect appropriate  I have personally reviewed the following:   Today's Data  . CBG stable . Creatinine beter 1.66 . LFTs WNL . procalcitonin negative . CBC unremarkable . UDS noted  Scheduled Meds: . amLODipine  5 mg Oral Daily  . vitamin C  500 mg Oral Daily  . aspirin EC  325 mg Oral Daily  . baricitinib  2 mg Oral Daily  . enoxaparin (LOVENOX) injection  0.5 mg/kg Subcutaneous Q24H  . hydrALAZINE  10 mg Oral Q6H  . insulin aspart  0-9 Units Subcutaneous TID WC  . methylPREDNISolone (SOLU-MEDROL) injection  80 mg Intravenous Q12H  . sodium chloride flush  3 mL Intravenous Q12H  . sodium chloride flush  3 mL Intravenous Q12H  . zinc sulfate  220 mg Oral Daily   Continuous Infusions: . sodium chloride    . remdesivir 100 mg in NS 100 mL 100 mg (04/17/20 0946)    Principal Problem:   Acute hypoxemic respiratory failure due to COVID-19 [facility]) Active Problems:   Pneumonia due to COVID-19 virus   Hypertensive urgency   Cocaine abuse (HCC)   Benign  essential HTN   CKD (chronic kidney disease), stage III   Elevated troponin   LOS: 1 day   How to contact the [facility] Attending or Consulting provider 7A - 7P or covering provider during after hours 7P -7A, for this patient?  1. Check the care team in [facility] and look for a) attending/consulting TR[facility] provider listed and b) the [facility] team listed 2. Log into [email or web address] and use [facility] universal password to access. If you do not have the password, please contact the hospital operator. 3. Locate the [facility] provider you are looking for under [facility] and page to a number that you can be directly reached. 4. If you still have difficulty reaching the provider, please page the [facility] (Director on Call) for the Hospitalists listed on amion for assistance.

## 2020-04-17 NOTE — ED Notes (Signed)
Per order O2 [place] turned down to 2L. Pt current O2 sat 98%

## 2020-04-17 NOTE — ED Notes (Signed)
Responded to pt's alarm sound in room. Pt had O2 sats of 87% on RA. Pt was sleeping, when pt aroused O2 sats returned to mid 90's. Pt stated "same thing happened last night" pt placed back on 2L [place]. Pt tolerates RA well when awake.

## 2020-04-17 NOTE — Hospital Course (Signed)
44 year old man PMH hypertension not on medications presented with shortness of breath.  Admitted for acute hypoxic respiratory failure secondary to Covid pneumonia. Started on remdesivir, solumedrol and baricitinib.

## 2020-04-17 NOTE — ED Notes (Signed)
Bedside commode placed in pt's room 

## 2020-04-17 NOTE — ED Notes (Signed)
Provided pt w/ phone to speak w/ mother.

## 2020-04-17 NOTE — ED Notes (Signed)
Pt given breakfast tray

## 2020-04-17 NOTE — ED Notes (Signed)
Provided pt w/ phone to speak w/ wife.

## 2020-04-18 LAB — FERRITIN: Ferritin: 163 ng/mL (ref 24–336)

## 2020-04-18 LAB — CBC WITH DIFFERENTIAL/PLATELET
Abs Immature Granulocytes: 0 10*3/uL (ref 0.00–0.07)
Basophils Absolute: 0 10*3/uL (ref 0.0–0.1)
Basophils Relative: 0 %
Eosinophils Absolute: 0 10*3/uL (ref 0.0–0.5)
Eosinophils Relative: 0 %
HCT: 46.3 % (ref 39.0–52.0)
Hemoglobin: 15 g/dL (ref 13.0–17.0)
Immature Granulocytes: 0 %
Lymphocytes Relative: 24 %
Lymphs Abs: 0.9 10*3/uL (ref 0.7–4.0)
MCH: 26.6 pg (ref 26.0–34.0)
MCHC: 32.4 g/dL (ref 30.0–36.0)
MCV: 82.1 fL (ref 80.0–100.0)
Monocytes Absolute: 0.4 10*3/uL (ref 0.1–1.0)
Monocytes Relative: 10 %
Neutro Abs: 2.6 10*3/uL (ref 1.7–7.7)
Neutrophils Relative %: 66 %
Platelets: 264 10*3/uL (ref 150–400)
RBC: 5.64 MIL/uL (ref 4.22–5.81)
RDW: 15 % (ref 11.5–15.5)
WBC: 3.9 10*3/uL — ABNORMAL LOW (ref 4.0–10.5)
nRBC: 0 % (ref 0.0–0.2)

## 2020-04-18 LAB — GLUCOSE, CAPILLARY
Glucose-Capillary: 182 mg/dL — ABNORMAL HIGH (ref 70–99)
Glucose-Capillary: 136 mg/dL — ABNORMAL HIGH (ref 70–99)
Glucose-Capillary: 147 mg/dL — ABNORMAL HIGH (ref 70–99)
Glucose-Capillary: 229 mg/dL — ABNORMAL HIGH (ref 70–99)

## 2020-04-18 LAB — COMPREHENSIVE METABOLIC PANEL
ALT: 14 U/L (ref 0–44)
AST: 22 U/L (ref 15–41)
Albumin: 3.5 g/dL (ref 3.5–5.0)
Alkaline Phosphatase: 56 U/L (ref 38–126)
Anion gap: 15 (ref 5–15)
BUN: 32 mg/dL — ABNORMAL HIGH (ref 6–20)
CO2: 20 mmol/L — ABNORMAL LOW (ref 22–32)
Calcium: 9.1 mg/dL (ref 8.9–10.3)
Chloride: 101 mmol/L (ref 98–111)
Creatinine, Ser: 1.76 mg/dL — ABNORMAL HIGH (ref 0.61–1.24)
GFR calc Af Amer: 53 mL/min — ABNORMAL LOW (ref >60)
GFR calc non Af Amer: 46 mL/min — ABNORMAL LOW (ref >60)
Glucose, Bld: 219 mg/dL — ABNORMAL HIGH (ref 70–99)
Potassium: 3.5 mmol/L (ref 3.5–5.1)
Sodium: 136 mmol/L (ref 135–145)
Total Bilirubin: 1 mg/dL (ref 0.3–1.2)
Total Protein: 7.4 g/dL (ref 6.5–8.1)

## 2020-04-18 LAB — MAGNESIUM: Magnesium: 2 mg/dL (ref 1.7–2.4)

## 2020-04-18 LAB — HEMOGLOBIN A1C
Hgb A1c MFr Bld: 6.6 % — ABNORMAL HIGH (ref 4.8–5.6)
Mean Plasma Glucose: 143 mg/dL

## 2020-04-18 LAB — FIBRIN DERIVATIVES D-DIMER (ARMC ONLY): Fibrin derivatives D-dimer (ARMC): 613.09 ng/mL (FEU) — ABNORMAL HIGH (ref 0.00–499.00)

## 2020-04-18 LAB — C-REACTIVE PROTEIN: CRP: 1.5 mg/dL — ABNORMAL HIGH (ref <1.0)

## 2020-04-18 LAB — PHOSPHORUS: Phosphorus: 3.5 mg/dL (ref 2.5–4.6)

## 2020-04-18 MED ORDER — AMLODIPINE BESYLATE 10 MG PO TABS
10.0000 mg | ORAL_TABLET | Freq: Every day | ORAL | Status: DC
  Administered 2020-04-18 – 2020-04-19 (×2): 10 mg via ORAL
  Filled 2020-04-18 (×2): qty 1

## 2020-04-18 MED ORDER — HYDRALAZINE HCL 50 MG PO TABS
50.0000 mg | ORAL_TABLET | Freq: Four times a day (QID) | ORAL | Status: DC
  Administered 2020-04-18 – 2020-04-19 (×3): 50 mg via ORAL
  Filled 2020-04-18 (×3): qty 1

## 2020-04-18 MED ORDER — BARICITINIB 2 MG PO TABS
2.0000 mg | ORAL_TABLET | Freq: Every day | ORAL | Status: DC
  Administered 2020-04-18: 2 mg via ORAL
  Filled 2020-04-18 (×3): qty 1

## 2020-04-18 MED ORDER — HYDRALAZINE HCL 25 MG PO TABS
25.0000 mg | ORAL_TABLET | Freq: Four times a day (QID) | ORAL | Status: DC
  Administered 2020-04-18: 25 mg via ORAL
  Filled 2020-04-18: qty 1

## 2020-04-18 NOTE — Progress Notes (Signed)
PROGRESS NOTE    Brad [name]  FGH:[id number] DOB: 1975-09-03 DOA: 04/16/2020 PCP: Patient, No Pcp Per  Brief Narrative:  44 year old man PMH hypertension not on medications presented with shortness of breath. Admitted for acute hypoxic respiratory failure secondary to Covid pneumonia. Started on remdesivir, solumedrol and baricitinib.   8/25: Patient seen and examined.  Respiratory status improved.  Blood pressure remains uncontrolled.  Cough improved.  Still some shortness of breath.  Especially on exertion.   Assessment & Plan:   Principal Problem:   Acute hypoxemic respiratory failure due to COVID-19 [facility]) Active Problems:   Hypertensive urgency   Pneumonia due to COVID-19 virus   Benign essential HTN   CKD (chronic kidney disease), stage III   Elevated troponin   Cocaine abuse (HCC)  Acute hypoxemic respiratory failure due to COVID-19 [facility]) --Appears a little better today --8/23 CXR right base infiltrate --oxygen 3L --inflammatory markers  Ferritin 125 > 114 >163  CRP 1.5  Fibrin derivatives 793 > 2096  >613 --Tx  Remdesiver 8/23 >   Steroids 8/23 >  Baricitinim 8/23 >  Pneumonia due to COVID-19 virus --plan as above  Hypertensive urgency --asymptomatic, appears to be rather chronic untreated HTN.  - Renal function appears to be baseline  -Norvasc 10 mg p.o. 3 times daily -Hydralazine 50 mg p.o. every 6 hours  Benign essential HTN --Started Norvasc on admit, dose increased to 10 mg --Add hydralazine 50 mg every 6 hours  CKD (chronic kidney disease), stage IIIa --CKD stage IIIa --Probably at baseline.  Secondary to hypertensive heart disease.  Elevated troponin --No signs or symptoms to suggest ACS. ACS ruled out. Troponin mildly elevated, stable. Elevation secondary to hypertensive heart disease, acute illness.  EKG abnormal but appears to be chronic.    Cocaine abuse (HCC) --avoid BB --counseled on cessation  DVT prophylaxis:  Lovenox Code Status: Full Family Communication: None today Disposition Plan: Status is: Inpatient  Remains inpatient appropriate because:Inpatient level of care appropriate due to severity of illness   Dispo: The patient is from: Home              Anticipated d/c is to: Home              Anticipated d/c date is: 2 days              Patient currently is not medically stable to d/c.  So with acute hypoxic respiratory failure in the setting of COVID-19 infection.  Anticipate 2 additional days of inpatient monitoring and treatment prior to disposition planning.  Patient also has marked uncontrolled hypertension.  Will need improvement in hemodynamic parameters.      Consultants:   None  Procedures:   None  Antimicrobials:   Remdesivir   Subjective: Patient seen and examined.  Endorses some cough and shortness of breath.  Improving over interval.  On 3 L nasal cannula  Objective: Vitals:   04/18/20 0636 04/18/20 0801 04/18/20 1005 04/18/20 1105  BP: (!) 181/126 (!) 182/126 (!) 177/115 (!) 197/118  Pulse: 86 88 91 78  Resp: 20 17  17   Temp: 98.3 F (36.8 C) 98.7 F (37.1 C) 98 F (36.7 C) 98.3 F (36.8 C)  TempSrc: Oral Oral Oral Oral  SpO2: 97% 96% 95% 98%  Weight:      Height:        Intake/Output Summary (Last 24 hours) at 04/18/2020 1450 Last data filed at 04/18/2020 1105 Gross per 24 hour  Intake 102.89 ml  Output  700 ml  Net -597.11 ml   Filed Weights   04/16/20 1115  Weight: 115.7 kg    Examination:  General exam: Appears calm and comfortable  Respiratory system: Bibasilar crackles.  Normal work of breathing.  3 L Cardiovascular system: S1 & S2 heard, RRR. No JVD, murmurs, rubs, gallops or clicks. No pedal edema. Gastrointestinal system: Abdomen is nondistended, soft and nontender. No organomegaly or masses felt. Normal bowel sounds heard. Central nervous system: Alert and oriented. No focal neurological deficits. Extremities: Symmetric 5 x 5  power. Skin: No rashes, lesions or ulcers Psychiatry: Judgement and insight appear normal. Mood & affect appropriate.     Data Reviewed: I have personally reviewed following labs and imaging studies  CBC: Recent Labs  Lab 04/16/20 1535 04/17/20 0306 04/18/20 0556  WBC 5.9 4.0 3.9*  NEUTROABS 4.1 2.9 2.6  HGB 15.3 14.7 15.0  HCT 46.2 45.1 46.3  MCV 80.6 83.2 82.1  PLT 270 254 264   Basic Metabolic Panel: Recent Labs  Lab 04/16/20 1535 04/17/20 0306 04/18/20 0556  NA 136 139 136  K 3.8 3.8 3.5  CL 98 102 101  CO2 26 24 20*  GLUCOSE 109* 127* 219*  BUN 27* 31* 32*  CREATININE 1.92* 1.66* 1.76*  CALCIUM 9.2 8.7* 9.1  MG  --  2.1 2.0  PHOS  --  2.9 3.5   GFR: Estimated Creatinine Clearance: 64.1 mL/min (A) (by C-G formula based on SCr of 1.76 mg/dL (H)). Liver Function Tests: Recent Labs  Lab 04/16/20 1535 04/17/20 0306 04/18/20 0556  AST 25 18 22   ALT 12 12 14   ALKPHOS 58 55 56  BILITOT 1.1 0.7 1.0  PROT 8.5* 7.6 7.4  ALBUMIN 4.1 3.5 3.5   No results for input(s): LIPASE, AMYLASE in the last 168 hours. No results for input(s): AMMONIA in the last 168 hours. Coagulation Profile: No results for input(s): INR, PROTIME in the last 168 hours. Cardiac Enzymes: No results for input(s): CKTOTAL, CKMB, CKMBINDEX, TROPONINI in the last 168 hours. BNP (last 3 results) No results for input(s): PROBNP in the last 8760 hours. HbA1C: Recent Labs    04/17/20 0306  HGBA1C 6.6*   CBG: Recent Labs  Lab 04/17/20 0948 04/17/20 1141 04/17/20 1704 04/18/20 0758 04/18/20 1101  GLUCAP 120* 157* 192* 182* 136*   Lipid Profile: No results for input(s): CHOL, HDL, LDLCALC, TRIG, CHOLHDL, LDLDIRECT in the last 72 hours. Thyroid Function Tests: No results for input(s): TSH, T4TOTAL, FREET4, T3FREE, THYROIDAB in the last 72 hours. Anemia Panel: Recent Labs    04/17/20 0306 04/18/20 0556  FERRITIN 114 163   Sepsis Labs: Recent Labs  Lab 04/16/20 1923   PROCALCITON <0.10    Recent Results (from the past 240 hour(s))  SARS Coronavirus 2 by RT PCR (hospital order, performed in [facility] hospital lab) Nasopharyngeal Nasopharyngeal Swab     Status: Abnormal   Collection Time: 04/16/20  1:05 PM   Specimen: Nasopharyngeal Swab  Result Value Ref Range Status   SARS Coronavirus 2 POSITIVE (A) NEGATIVE Final    Comment: RESULT CALLED TO, READ BACK BY AND VERIFIED WITH: BILL SMITH RN AT 1442 ON 04/16/20 SNG  (NOTE) SARS-CoV-2 target nucleic acids are DETECTED  SARS-CoV-2 RNA is generally detectable in upper respiratory specimens  during the acute phase of infection.  Positive results are indicative  of the presence of the identified virus, but do not rule out bacterial infection or co-infection with other pathogens not detected by the test.  Clinical correlation with patient history and  other diagnostic information is necessary to determine patient infection status.  The expected result is negative.  Fact Sheet for Patients:   BoilerBrush.com.cy   Fact Sheet for Healthcare Providers:   https://pope.com/    This test is not yet approved or cleared by the [place] FDA and  has been authorized for detection and/or diagnosis of SARS-CoV-2 by FDA under an Emergency Use Authorization (EUA).  This EUA will remain in effect (meaning this  test can be used) for the duration of  the COVID-19 declaration under Section 564(b)(1) of the Act, 21 U.S.C. section 360-bbb-3(b)(1), unless the authorization is terminated or revoked sooner.  Performed at [facility], [address], [place], [place] [address]          Radiology Studies: No results found.      Scheduled Meds:  amLODipine  10 mg Oral Daily   vitamin C  500 mg Oral Daily   aspirin EC  325 mg Oral Daily   baricitinib  2 mg Oral Daily   enoxaparin (LOVENOX) injection  0.5 mg/kg Subcutaneous Q24H    hydrALAZINE  50 mg Oral Q6H   insulin aspart  0-9 Units Subcutaneous TID WC   methylPREDNISolone (SOLU-MEDROL) injection  80 mg Intravenous Q12H   sodium chloride flush  3 mL Intravenous Q12H   sodium chloride flush  3 mL Intravenous Q12H   zinc sulfate  220 mg Oral Daily   Continuous Infusions:  sodium chloride     remdesivir 100 mg in NS 100 mL 100 mg (04/18/20 0853)     LOS: 2 days    Time spent: 25 minutes    Tresa Moore, MD [facility] Pager [phone]  If 7PM-7AM, please contact night-coverage 04/18/2020, 2:50 PM

## 2020-04-19 LAB — CBC WITH DIFFERENTIAL/PLATELET
Abs Immature Granulocytes: 0.02 10*3/uL (ref 0.00–0.07)
Basophils Absolute: 0 10*3/uL (ref 0.0–0.1)
Basophils Relative: 0 %
Eosinophils Absolute: 0 10*3/uL (ref 0.0–0.5)
Eosinophils Relative: 0 %
HCT: 47.8 % (ref 39.0–52.0)
Hemoglobin: 15.7 g/dL (ref 13.0–17.0)
Immature Granulocytes: 0 %
Lymphocytes Relative: 26 %
Lymphs Abs: 1.2 10*3/uL (ref 0.7–4.0)
MCH: 27 pg (ref 26.0–34.0)
MCHC: 32.8 g/dL (ref 30.0–36.0)
MCV: 82.3 fL (ref 80.0–100.0)
Monocytes Absolute: 0.6 10*3/uL (ref 0.1–1.0)
Monocytes Relative: 12 %
Neutro Abs: 3 10*3/uL (ref 1.7–7.7)
Neutrophils Relative %: 62 %
Platelets: 272 10*3/uL (ref 150–400)
RBC: 5.81 MIL/uL (ref 4.22–5.81)
RDW: 15.3 % (ref 11.5–15.5)
WBC: 4.8 10*3/uL (ref 4.0–10.5)
nRBC: 0 % (ref 0.0–0.2)

## 2020-04-19 LAB — FIBRIN DERIVATIVES D-DIMER (ARMC ONLY): Fibrin derivatives D-dimer (ARMC): 467.27 ng/mL (FEU) (ref 0.00–499.00)

## 2020-04-19 LAB — COMPREHENSIVE METABOLIC PANEL
ALT: 16 U/L (ref 0–44)
AST: 21 U/L (ref 15–41)
Albumin: 3.6 g/dL (ref 3.5–5.0)
Alkaline Phosphatase: 55 U/L (ref 38–126)
Anion gap: 13 (ref 5–15)
BUN: 33 mg/dL — ABNORMAL HIGH (ref 6–20)
CO2: 21 mmol/L — ABNORMAL LOW (ref 22–32)
Calcium: 9.3 mg/dL (ref 8.9–10.3)
Chloride: 104 mmol/L (ref 98–111)
Creatinine, Ser: 1.5 mg/dL — ABNORMAL HIGH (ref 0.61–1.24)
GFR calc Af Amer: >60 mL/min (ref >60)
GFR calc non Af Amer: 56 mL/min — ABNORMAL LOW (ref >60)
Glucose, Bld: 142 mg/dL — ABNORMAL HIGH (ref 70–99)
Potassium: 3.7 mmol/L (ref 3.5–5.1)
Sodium: 138 mmol/L (ref 135–145)
Total Bilirubin: 0.8 mg/dL (ref 0.3–1.2)
Total Protein: 7.7 g/dL (ref 6.5–8.1)

## 2020-04-19 LAB — FERRITIN: Ferritin: 244 ng/mL (ref 24–336)

## 2020-04-19 LAB — MAGNESIUM: Magnesium: 2.1 mg/dL (ref 1.7–2.4)

## 2020-04-19 LAB — GLUCOSE, CAPILLARY
Glucose-Capillary: 128 mg/dL — ABNORMAL HIGH (ref 70–99)
Glucose-Capillary: 197 mg/dL — ABNORMAL HIGH (ref 70–99)

## 2020-04-19 LAB — C-REACTIVE PROTEIN: CRP: 0.7 mg/dL (ref <1.0)

## 2020-04-19 LAB — PHOSPHORUS: Phosphorus: 3.4 mg/dL (ref 2.5–4.6)

## 2020-04-19 MED ORDER — DEXAMETHASONE 4 MG PO TABS
6.0000 mg | ORAL_TABLET | Freq: Every day | ORAL | Status: DC
  Administered 2020-04-19: 6 mg via ORAL
  Filled 2020-04-19: qty 1.5

## 2020-04-19 MED ORDER — DEXAMETHASONE 6 MG PO TABS: 6.0000 mg | ORAL_TABLET | Freq: Every day | ORAL | 0 refills | Status: AC

## 2020-04-19 MED ORDER — AMLODIPINE BESYLATE 10 MG PO TABS: 10.0000 mg | ORAL_TABLET | Freq: Every day | ORAL | 0 refills | Status: DC

## 2020-04-19 MED ORDER — HYDRALAZINE HCL 50 MG PO TABS
100.0000 mg | ORAL_TABLET | Freq: Three times a day (TID) | ORAL | Status: DC
  Administered 2020-04-19: 100 mg via ORAL
  Filled 2020-04-19: qty 2

## 2020-04-19 MED ORDER — ASCORBIC ACID 500 MG PO TABS: 500.0000 mg | ORAL_TABLET | Freq: Every day | ORAL | 0 refills | Status: AC

## 2020-04-19 MED ORDER — HYDRALAZINE HCL 100 MG PO TABS: 100.0000 mg | ORAL_TABLET | Freq: Three times a day (TID) | ORAL | 0 refills | Status: DC

## 2020-04-19 MED ORDER — ZINC SULFATE 220 (50 ZN) MG PO CAPS: 220.0000 mg | ORAL_CAPSULE | Freq: Every day | ORAL | 0 refills | Status: AC

## 2020-04-19 NOTE — Progress Notes (Signed)
Discussed AVS paperwork with the patient. Reminded pt to pick up his prescriptions at [facility]. Low sodium diet was reiterated since salt has such a adverse effect on his BP which was the reason discharge was prolonged this AM. Pt agreed that he needs to be mindful of his salt intake. PIV was removed pt was transported off the unit via wheelchair. Sister will be responsible for driving patient home.

## 2020-04-19 NOTE — Progress Notes (Signed)
Pt was anxious to go home. BP did decrease. Pt said he could not wait any longer because his ride would be here at 2pm. Dr was made aware.

## 2020-04-19 NOTE — Progress Notes (Signed)
Patient scheduled for outpatient Remdesivir infusions at 11am at [facility]. Please inform the patient to park at [address], [place], as staff will be escorting the patient through the east entrance of the hospital.  °  °There is a wave flag banner located near the entrance on [address] Turn into this entrance and immediately turn left and park in 1 of the 5 designated Covid Infusion Parking spots. There is a phone number on the sign, please call and let the staff know what spot you are in and we will come out and get you. For questions call [phone].  Thanks. ° °

## 2020-04-19 NOTE — Progress Notes (Signed)
Retaken Vitals signa at 1315. Bp 173/117 and HR 93. Administered Hydralyzine 100 mg po . Will recheck at 1430. Cont to monitor.

## 2020-04-19 NOTE — Discharge Summary (Signed)
Physician Discharge Summary  Brad [name] MOL:[id number] DOB: Nov 22, 1975 DOA: 04/16/2020  PCP: Patient, No Pcp Per  Admit date: 04/16/2020 Discharge date: 04/19/2020  Admitted From: Home Disposition:  Home  Recommendations for Outpatient Follow-up:  1. Follow up with PCP in 1-2 weeks 2.   Home Health:No Equipment/Devices:None Discharge Condition:Stable CODE STATUS:Full Diet recommendation: Heart Healthy / Carb Modified   Brief/Interim Summary: 44 year old man PMH hypertension not on medications presented with shortness of breath. Admitted for acute hypoxic respiratory failure secondary to Covid pneumonia. Started on remdesivir, solumedrol and baricitinib.  8/25: Patient seen and examined.  Respiratory status improved.  Blood pressure remains uncontrolled.  Cough improved.  Still some shortness of breath.  Especially on exertion.  8/26: Patient seen and examined the day of discharge.  Respiratory status stabilized.  Patient stable on room air.  Will complete additional 1 dose of remdesivir tomorrow as outpatient infusion.  Appropriate orders placed.  Blood pressure remained severely uncontrolled.  We will not add new agents at this time.  Discharged on amlodipine 10 mg daily and hydralazine 100 mg 3 times daily.  Patient educated on good blood pressure control prior to discharge.  He expressed understanding.  Clear return to ED instructions provided.  Discharge Diagnoses:  Principal Problem:   Acute hypoxemic respiratory failure due to COVID-19 [facility]) Active Problems:   Hypertensive urgency   Pneumonia due to COVID-19 virus   Benign essential HTN   CKD (chronic kidney disease), stage III   Elevated troponin   Cocaine abuse (HCC)  Acute hypoxemic respiratory failure due to COVID-19 (HCC) --Appearsa little better today --8/23CXRright base infiltrate  Oxygen initially at 3 L.  Inflammatory markers trended down during course hospitalization.  Submental oxygen titrated off.   Stable for discharge home.  No oxygen required on discharge.  4 doses of remdesivir in house.  The fifth dose will be administered as an outpatient.  Steroids prescribed on discharge.  Pneumonia due to COVID-19 virus --plan as above  Hypertensive urgency --asymptomatic, appears to be rather chronic untreated HTN.  - Renal function appears to be baseline -Norvasc 10 mg p.o. 3 times daily -Hydralazine 100 mg p.o. every 8 hours Patient educated on good blood pressure control.  Needs referral to primary care/internal medicine.  Referral placed in discharge packet  Benign essential HTN --StartedNorvasc on admit, dose increased to 10 mg --Start hydralazine 100 every 8  CKD (chronic kidney disease), stage IIIa --CKD stage IIIa --Probably at baseline. Secondary to hypertensive heart disease.  Elevated troponin --No signs or symptoms to suggest ACS.ACS ruled out. Troponin mildly elevated, stable.Elevation secondaryto hypertensive heart disease, acute illness. EKG abnormal but appears to be chronic.   Cocaine abuse (HCC) --avoid BB --counseled on cessation  Discharge Instructions  Discharge Instructions    Ambulatory referral to Internal Medicine   Complete by: As directed    Diet - low sodium heart healthy   Complete by: As directed    Increase activity slowly   Complete by: As directed      Allergies as of 04/19/2020   No Known Allergies     Medication List    STOP taking these medications   lisinopril 10 MG tablet Commonly known as: ZESTRIL   metoprolol tartrate 50 MG tablet Commonly known as: LOPRESSOR     TAKE these medications   amLODipine 10 MG tablet Commonly known as: NORVASC Take 1 tablet (10 mg total) by mouth daily.   ascorbic acid 500 MG tablet Commonly known as: VITAMIN C  Take 1 tablet (500 mg total) by mouth daily.   dexamethasone 6 MG tablet Commonly known as: DECADRON Take 1 tablet (6 mg total) by mouth daily for 7 days.    hydrALAZINE 100 MG tablet Commonly known as: APRESOLINE Take 1 tablet (100 mg total) by mouth every 8 (eight) hours.   zinc sulfate 220 (50 Zn) MG capsule Take 1 capsule (220 mg total) by mouth daily.       No Known Allergies  Consultations:  None   Procedures/Studies: DG Chest 2 View  Result Date: 04/16/2020 CLINICAL DATA:  Cough, shortness of breath. EXAM: CHEST - 2 VIEW COMPARISON:  January 29, 2016. FINDINGS: Stable cardiomediastinal silhouette. No pneumothorax or pleural effusion is noted. Left lung is clear. Mild right lower lobe airspace opacity is noted concerning for pneumonia. Bony thorax is unremarkable. IMPRESSION: Mild right lower lobe pneumonia. Followup PA and lateral chest X-ray is recommended in 3-4 weeks following trial of antibiotic therapy to ensure resolution and exclude underlying malignancy. Electronically Signed   By: Lupita Raider M.D.   On: 04/16/2020 11:41    (Echo, Carotid, EGD, Colonoscopy, ERCP)    Subjective: Seen and examined on day of discharge.  Asymptomatic.  Ambulating around room without difficulty.  No oxygen requirement.  Stable for discharge home.  Discharge Exam: Vitals:   04/19/20 1127 04/19/20 1300  BP: (!) 193/115 (!) 173/117  Pulse: 88 93  Resp: 18 18  Temp: 97.8 F (36.6 C) 97.8 F (36.6 C)  SpO2: 98% 96%   Vitals:   04/19/20 0435 04/19/20 0743 04/19/20 1127 04/19/20 1300  BP: (!) 158/122 118/88 (!) 193/115 (!) 173/117  Pulse: 78 88 88 93  Resp: 20 18 18 18   Temp:  97.9 F (36.6 C) 97.8 F (36.6 C) 97.8 F (36.6 C)  TempSrc:  Oral Oral Oral  SpO2:  96% 98% 96%  Weight:      Height:        General: Pt is alert, awake, not in acute distress Cardiovascular: RRR, S1/S2 +, no rubs, no gallops Respiratory: CTA bilaterally, no wheezing, no rhonchi Abdominal: Soft, NT, ND, bowel sounds + Extremities: no edema, no cyanosis    The results of significant diagnostics from this hospitalization (including imaging,  microbiology, ancillary and laboratory) are listed below for reference.     Microbiology: Recent Results (from the past 240 hour(s))  SARS Coronavirus 2 by RT PCR (hospital order, performed in [facility] hospital lab) Nasopharyngeal Nasopharyngeal Swab     Status: Abnormal   Collection Time: 04/16/20  1:05 PM   Specimen: Nasopharyngeal Swab  Result Value Ref Range Status   SARS Coronavirus 2 POSITIVE (A) NEGATIVE Final    Comment: RESULT CALLED TO, READ BACK BY AND VERIFIED WITH: BILL SMITH RN AT 1442 ON 04/16/20 SNG  (NOTE) SARS-CoV-2 target nucleic acids are DETECTED  SARS-CoV-2 RNA is generally detectable in upper respiratory specimens  during the acute phase of infection.  Positive results are indicative  of the presence of the identified virus, but do not rule out bacterial infection or co-infection with other pathogens not detected by the test.  Clinical correlation with patient history and  other diagnostic information is necessary to determine patient infection status.  The expected result is negative.  Fact Sheet for Patients:   04/18/20   Fact Sheet for Healthcare Providers:   BoilerBrush.com.cy    This test is not yet approved or cleared by the https://pope.com/ FDA and  has been authorized for  detection and/or diagnosis of SARS-CoV-2 by FDA under an Emergency Use Authorization (EUA).  This EUA will remain in effect (meaning this  test can be used) for the duration of  the COVID-19 declaration under Section 564(b)(1) of the Act, 21 U.S.C. section 360-bbb-3(b)(1), unless the authorization is terminated or revoked sooner.  Performed at [facility], [address] [place], [place] [address]      Labs: BNP (last 3 results) No results for input(s): BNP in the last 8760 hours. Basic Metabolic Panel: Recent Labs  Lab 04/16/20 1535 04/17/20 0306 04/18/20 0556 04/19/20 0528  NA 136 139 136 138  K  3.8 3.8 3.5 3.7  CL 98 102 101 104  CO2 26 24 20* 21*  GLUCOSE 109* 127* 219* 142*  BUN 27* 31* 32* 33*  CREATININE 1.92* 1.66* 1.76* 1.50*  CALCIUM 9.2 8.7* 9.1 9.3  MG  --  2.1 2.0 2.1  PHOS  --  2.9 3.5 3.4   Liver Function Tests: Recent Labs  Lab 04/16/20 1535 04/17/20 0306 04/18/20 0556 04/19/20 0528  AST 25 18 22 21   ALT 12 12 14 16   ALKPHOS 58 55 56 55  BILITOT 1.1 0.7 1.0 0.8  PROT 8.5* 7.6 7.4 7.7  ALBUMIN 4.1 3.5 3.5 3.6   No results for input(s): LIPASE, AMYLASE in the last 168 hours. No results for input(s): AMMONIA in the last 168 hours. CBC: Recent Labs  Lab 04/16/20 1535 04/17/20 0306 04/18/20 0556 04/19/20 0528  WBC 5.9 4.0 3.9* 4.8  NEUTROABS 4.1 2.9 2.6 3.0  HGB 15.3 14.7 15.0 15.7  HCT 46.2 45.1 46.3 47.8  MCV 80.6 83.2 82.1 82.3  PLT 270 254 264 272   Cardiac Enzymes: No results for input(s): CKTOTAL, CKMB, CKMBINDEX, TROPONINI in the last 168 hours. BNP: Invalid input(s): POCBNP CBG: Recent Labs  Lab 04/18/20 1101 04/18/20 1610 04/18/20 2105 04/19/20 0742 04/19/20 1124  GLUCAP 136* 147* 229* 128* 197*   D-Dimer No results for input(s): DDIMER in the last 72 hours. Hgb A1c Recent Labs    04/17/20 0306  HGBA1C 6.6*   Lipid Profile No results for input(s): CHOL, HDL, LDLCALC, TRIG, CHOLHDL, LDLDIRECT in the last 72 hours. Thyroid function studies No results for input(s): TSH, T4TOTAL, T3FREE, THYROIDAB in the last 72 hours.  Invalid input(s): FREET3 Anemia work up Recent Labs    04/18/20 0556 04/19/20 0528  FERRITIN 163 244   Urinalysis    Component Value Date/Time   COLORURINE YELLOW (A) 12/08/2019 2101   APPEARANCEUR CLEAR (A) 12/08/2019 2101   APPEARANCEUR Clear 03/23/2012 1704   LABSPEC 1.018 12/08/2019 2101   LABSPEC 1.021 03/23/2012 1704   PHURINE 6.0 12/08/2019 2101   GLUCOSEU 50 (A) 12/08/2019 2101   GLUCOSEU Negative 03/23/2012 1704   HGBUR NEGATIVE 12/08/2019 2101   BILIRUBINUR NEGATIVE 12/08/2019 2101    BILIRUBINUR Negative 03/23/2012 1704   KETONESUR 5 (A) 12/08/2019 2101   PROTEINUR >=300 (A) 12/08/2019 2101   NITRITE NEGATIVE 12/08/2019 2101   LEUKOCYTESUR NEGATIVE 12/08/2019 2101   LEUKOCYTESUR Negative 03/23/2012 1704   Sepsis Labs Invalid input(s): PROCALCITONIN,  WBC,  LACTICIDVEN Microbiology Recent Results (from the past 240 hour(s))  SARS Coronavirus 2 by RT PCR (hospital order, performed in [facility] hospital lab) Nasopharyngeal Nasopharyngeal Swab     Status: Abnormal   Collection Time: 04/16/20  1:05 PM   Specimen: Nasopharyngeal Swab  Result Value Ref Range Status   SARS Coronavirus 2 POSITIVE (A) NEGATIVE Final    Comment:  RESULT CALLED TO, READ BACK BY AND VERIFIED WITH: BILL SMITH RN AT 1442 ON 04/16/20 SNG  (NOTE) SARS-CoV-2 target nucleic acids are DETECTED  SARS-CoV-2 RNA is generally detectable in upper respiratory specimens  during the acute phase of infection.  Positive results are indicative  of the presence of the identified virus, but do not rule out bacterial infection or co-infection with other pathogens not detected by the test.  Clinical correlation with patient history and  other diagnostic information is necessary to determine patient infection status.  The expected result is negative.  Fact Sheet for Patients:   BoilerBrush.com.cyhttps://www.fda.gov/media/136312/download   Fact Sheet for Healthcare Providers:   https://pope.com/https://www.fda.gov/media/136313/download    This test is not yet approved or cleared by the [place] FDA and  has been authorized for detection and/or diagnosis of SARS-CoV-2 by FDA under an Emergency Use Authorization (EUA).  This EUA will remain in effect (meaning this  test can be used) for the duration of  the COVID-19 declaration under Section 564(b)(1) of the Act, 21 U.S.C. section 360-bbb-3(b)(1), unless the authorization is terminated or revoked sooner.  Performed at [facility], [address] [place], [place]  [id number]      Time coordinating discharge: Over 30 minutes  SIGNED:   Tresa MooreSudheer B Corwin Kuiken, MD  [facility] 04/19/2020, 2:15 PM Pager   If 7PM-7AM, please contact night-coverage

## 2020-04-19 NOTE — Discharge Instructions (Signed)
Patient scheduled for outpatient Remdesivir infusions at 11am at [facility]. Please inform the patient to park at [address] [place], [place], as staff will be escorting the patient through the east entrance of the hospital. The appointment will take approx. 45 min.   There is a wave flag banner located near the entrance on [address]. Turn into this entrance and immediately turn left and park in 1 of the 5 designated Covid Infusion Parking spots. There is a phone number on the sign, please call and let the staff know what spot you are in and we will come out and get you. For questions call [phone].  Thanks.

## 2020-04-20 ENCOUNTER — Ambulatory Visit (HOSPITAL_COMMUNITY): Payer: Self-pay | Attending: Pulmonary Disease

## 2020-04-26 ENCOUNTER — Other Ambulatory Visit: Payer: Self-pay

## 2020-04-26 ENCOUNTER — Encounter: Payer: Self-pay | Admitting: Emergency Medicine

## 2020-04-26 ENCOUNTER — Emergency Department: Payer: HRSA Program

## 2020-04-26 ENCOUNTER — Emergency Department
Admission: EM | Admit: 2020-04-26 | Discharge: 2020-04-26 | Disposition: A | Discharge status: Home / Self Care | Payer: HRSA Program | Attending: Emergency Medicine | Admitting: Emergency Medicine

## 2020-04-26 DIAGNOSIS — F1590 Other stimulant use, unspecified, uncomplicated: Secondary | ICD-10-CM | POA: Diagnosis not present

## 2020-04-26 DIAGNOSIS — F17210 Nicotine dependence, cigarettes, uncomplicated: Secondary | ICD-10-CM | POA: Insufficient documentation

## 2020-04-26 DIAGNOSIS — N183 Chronic kidney disease, stage 3 unspecified: Secondary | ICD-10-CM | POA: Insufficient documentation

## 2020-04-26 DIAGNOSIS — U071 COVID-19: Secondary | ICD-10-CM | POA: Insufficient documentation

## 2020-04-26 DIAGNOSIS — R0602 Shortness of breath: Secondary | ICD-10-CM | POA: Diagnosis present

## 2020-04-26 DIAGNOSIS — I129 Hypertensive chronic kidney disease with stage 1 through stage 4 chronic kidney disease, or unspecified chronic kidney disease: Secondary | ICD-10-CM | POA: Insufficient documentation

## 2020-04-26 DIAGNOSIS — Z79899 Other long term (current) drug therapy: Secondary | ICD-10-CM | POA: Insufficient documentation

## 2020-04-26 DIAGNOSIS — J45909 Unspecified asthma, uncomplicated: Secondary | ICD-10-CM | POA: Insufficient documentation

## 2020-04-26 LAB — COMPREHENSIVE METABOLIC PANEL
ALT: 26 U/L (ref 0–44)
AST: 35 U/L (ref 15–41)
Albumin: 3.3 g/dL — ABNORMAL LOW (ref 3.5–5.0)
Alkaline Phosphatase: 54 U/L (ref 38–126)
Anion gap: 12 (ref 5–15)
BUN: 29 mg/dL — ABNORMAL HIGH (ref 6–20)
CO2: 20 mmol/L — ABNORMAL LOW (ref 22–32)
Calcium: 8.4 mg/dL — ABNORMAL LOW (ref 8.9–10.3)
Chloride: 102 mmol/L (ref 98–111)
Creatinine, Ser: 1.85 mg/dL — ABNORMAL HIGH (ref 0.61–1.24)
GFR calc Af Amer: 50 mL/min — ABNORMAL LOW (ref >60)
GFR calc non Af Amer: 43 mL/min — ABNORMAL LOW (ref >60)
Glucose, Bld: 137 mg/dL — ABNORMAL HIGH (ref 70–99)
Potassium: 4 mmol/L (ref 3.5–5.1)
Sodium: 134 mmol/L — ABNORMAL LOW (ref 135–145)
Total Bilirubin: 0.7 mg/dL (ref 0.3–1.2)
Total Protein: 7.7 g/dL (ref 6.5–8.1)

## 2020-04-26 LAB — CBC
HCT: 45.7 % (ref 39.0–52.0)
Hemoglobin: 15.4 g/dL (ref 13.0–17.0)
MCH: 26.9 pg (ref 26.0–34.0)
MCHC: 33.7 g/dL (ref 30.0–36.0)
MCV: 79.8 fL — ABNORMAL LOW (ref 80.0–100.0)
Platelets: 228 10*3/uL (ref 150–400)
RBC: 5.73 MIL/uL (ref 4.22–5.81)
RDW: 14.6 % (ref 11.5–15.5)
WBC: 9.2 10*3/uL (ref 4.0–10.5)
nRBC: 0 % (ref 0.0–0.2)

## 2020-04-26 MED ORDER — ONDANSETRON HCL 4 MG PO TABS: 4.0000 mg | ORAL_TABLET | Freq: Three times a day (TID) | ORAL | 0 refills | Status: DC | PRN

## 2020-04-26 MED ORDER — ALBUTEROL SULFATE HFA 108 (90 BASE) MCG/ACT IN AERS: 2.0000 | INHALATION_SPRAY | Freq: Four times a day (QID) | RESPIRATORY_TRACT | 0 refills | Status: DC | PRN

## 2020-04-26 NOTE — ED Notes (Signed)
Pt ambulated in room x5 minutes while on continuous pulse oximetry; sats maintained 92-94% throughout.

## 2020-04-26 NOTE — Discharge Instructions (Addendum)
Please be sure to focus on hydrating with fluids. You can have someone buy a pulse oximeter (a tool that measures your oxygen level) when they pick up your prescriptions from the pharmacy.

## 2020-04-26 NOTE — ED Triage Notes (Signed)
Pt reports dx'd with COVID last week and now is SOB and has a fever

## 2020-04-26 NOTE — ED Provider Notes (Signed)
[facility] Emergency Department Provider Note   ____________________________________________   I have reviewed the triage vital signs and the nursing notes.   HISTORY  Chief Complaint Shortness of Breath and Fever   History limited by: Not Limited   HPI Brad [name] is a 44 y.o. male who presents to the emergency department today with concern for continued symptoms related to COVID 19. Since being discharged from the hospital roughly 1 week ago he states he has continued to have shortness of breath and weakness. His symptoms have not necessarily gotten worse but they also have not improved. The patient has had frequent cough and low grade fevers. States that he has had temperature of 100.  Records reviewed. Per medical record review patient has a history of recent admission for COVID-19.  Past Medical History:  Diagnosis Date  . Asthma   . Diverticulitis   . Hypertension     Patient Active Problem List   Diagnosis Date Noted  . Cocaine abuse (HCC) 04/17/2020  . Pneumonia due to COVID-19 virus 04/16/2020  . Acute hypoxemic respiratory failure due to COVID-19 (HCC) 04/16/2020  . Benign essential HTN 04/16/2020  . CKD (chronic kidney disease), stage III 04/16/2020  . Elevated troponin 04/16/2020  . AKI (acute kidney injury) (HCC)   . Diverticulitis 10/06/2018  . Hypertensive urgency 04/18/2016  . Intractable nausea and vomiting 05/31/2015    Past Surgical History:  Procedure Laterality Date  . ANKLE SURGERY      Prior to Admission medications   Medication Sig Start Date End Date Taking? Authorizing Provider  amLODipine (NORVASC) 10 MG tablet Take 1 tablet (10 mg total) by mouth daily. 04/19/20 05/19/20  Tresa Moore, MD  ascorbic acid (VITAMIN C) 500 MG tablet Take 1 tablet (500 mg total) by mouth daily. 04/19/20 05/19/20  Tresa Moore, MD  dexamethasone (DECADRON) 6 MG tablet Take 1 tablet (6 mg total) by mouth daily for 7 days.  04/19/20 04/26/20  Tresa Moore, MD  hydrALAZINE (APRESOLINE) 100 MG tablet Take 1 tablet (100 mg total) by mouth every 8 (eight) hours. 04/19/20 05/19/20  Tresa Moore, MD  zinc sulfate 220 (50 Zn) MG capsule Take 1 capsule (220 mg total) by mouth daily. 04/19/20 05/19/20  Tresa Moore, MD    Allergies Patient has no known allergies.  Family History  Problem Relation Age of Onset  . Hypertension Mother     Social History Social History   Tobacco Use  . Smoking status: Current Every Day Smoker    Packs/day: 0.50    Types: Cigarettes  . Smokeless tobacco: Never Used  Substance Use Topics  . Alcohol use: Yes    Comment: occas  . Drug use: Yes    Types: Marijuana    Comment: daily    Review of Systems Constitutional: Positive for fever. Eyes: No visual changes. ENT: No sore throat. Cardiovascular: Denies chest pain. Respiratory: Positive for cough and shortness of breath. Gastrointestinal: No abdominal pain.  No nausea, no vomiting.  No diarrhea.   Genitourinary: Negative for dysuria. Musculoskeletal: Negative for back pain. Skin: Negative for rash. Neurological: Positive for change in taste.  ____________________________________________   PHYSICAL EXAM:  VITAL SIGNS: ED Triage Vitals  Enc Vitals Group     BP 04/26/20 1237 (!) 153/98     Pulse Rate 04/26/20 1237 (!) 103     Resp 04/26/20 1237 (!) 21     Temp 04/26/20 1237 99.3 F (37.4 C)  Temp Source 04/26/20 1237 Oral     SpO2 04/26/20 1237 100 %     Weight 04/26/20 1238 265 lb (120.2 kg)     Height 04/26/20 1238 5\' 6"  (1.676 m)     Head Circumference --      Peak Flow --      Pain Score 04/26/20 1237 0   Constitutional: Alert and oriented.  Eyes: Conjunctivae are normal.  ENT      Head: Normocephalic and atraumatic.      Nose: No congestion/rhinnorhea.      Mouth/Throat: Mucous membranes are moist.      Neck: No stridor. Hematological/Lymphatic/Immunilogical: No cervical  lymphadenopathy. Cardiovascular: Normal rate, regular rhythm.  No murmurs, rubs, or gallops.  Respiratory: Normal respiratory effort without tachypnea nor retractions. Breath sounds are clear and equal bilaterally. No wheezes/rales/rhonchi. Gastrointestinal: Soft and non tender. No rebound. No guarding.  Genitourinary: Deferred Musculoskeletal: Normal range of motion in all extremities. No lower extremity edema. Neurologic:  Normal speech and language. No gross focal neurologic deficits are appreciated.  Skin:  Skin is warm, dry and intact. No rash noted. Psychiatric: Mood and affect are normal. Speech and behavior are normal. Patient exhibits appropriate insight and judgment.  ____________________________________________    LABS (pertinent positives/negatives)  CBC wbc 9.2, hgb 15.4, plt 228 CMP na 134, k 4.0, glu 137, cr 1.85  ____________________________________________   EKG  None  ____________________________________________    RADIOLOGY  CXR Worsening bilateral patchy airspace opacity  ____________________________________________   PROCEDURES  Procedures  ____________________________________________   INITIAL IMPRESSION / ASSESSMENT AND PLAN / ED COURSE  Pertinent labs & imaging results that were available during my care of the patient were reviewed by me and considered in my medical decision making (see chart for details).   Patient presented to the emergency department today because of concern for shortness of breath and fever in the setting of known covid positive status. Patient was not hypoxic on ambulation. CXR is consistent with covid. Discussed symptomatic care with patient at this time. Will plan on discharging with prescription for albuterol inhaler and antiemetic.  ____________________________________________   FINAL CLINICAL IMPRESSION(S) / ED DIAGNOSES  Final diagnoses:  COVID-19  Shortness of breath     Note: This dictation was prepared  with Dragon dictation. Any transcriptional errors that result from this process are unintentional     06/26/20, MD 04/26/20 2021

## 2021-01-03 ENCOUNTER — Emergency Department
Admission: EM | Admit: 2021-01-03 | Discharge: 2021-01-03 | Disposition: A | Discharge status: Home / Self Care | Payer: Self-pay | Attending: Emergency Medicine | Admitting: Emergency Medicine

## 2021-01-03 ENCOUNTER — Emergency Department: Payer: Self-pay

## 2021-01-03 ENCOUNTER — Other Ambulatory Visit: Payer: Self-pay

## 2021-01-03 DIAGNOSIS — I10 Essential (primary) hypertension: Secondary | ICD-10-CM

## 2021-01-03 DIAGNOSIS — N183 Chronic kidney disease, stage 3 unspecified: Secondary | ICD-10-CM | POA: Insufficient documentation

## 2021-01-03 DIAGNOSIS — Z79899 Other long term (current) drug therapy: Secondary | ICD-10-CM | POA: Insufficient documentation

## 2021-01-03 DIAGNOSIS — F17210 Nicotine dependence, cigarettes, uncomplicated: Secondary | ICD-10-CM | POA: Insufficient documentation

## 2021-01-03 DIAGNOSIS — I130 Hypertensive heart and chronic kidney disease with heart failure and stage 1 through stage 4 chronic kidney disease, or unspecified chronic kidney disease: Secondary | ICD-10-CM | POA: Insufficient documentation

## 2021-01-03 DIAGNOSIS — R0602 Shortness of breath: Secondary | ICD-10-CM

## 2021-01-03 DIAGNOSIS — I509 Heart failure, unspecified: Secondary | ICD-10-CM | POA: Insufficient documentation

## 2021-01-03 DIAGNOSIS — J45909 Unspecified asthma, uncomplicated: Secondary | ICD-10-CM | POA: Insufficient documentation

## 2021-01-03 LAB — CBC
HCT: 41.8 % (ref 39.0–52.0)
Hemoglobin: 13.6 g/dL (ref 13.0–17.0)
MCH: 26 pg (ref 26.0–34.0)
MCHC: 32.5 g/dL (ref 30.0–36.0)
MCV: 79.9 fL — ABNORMAL LOW (ref 80.0–100.0)
Platelets: 290 10*3/uL (ref 150–400)
RBC: 5.23 MIL/uL (ref 4.22–5.81)
RDW: 15.9 % — ABNORMAL HIGH (ref 11.5–15.5)
WBC: 8 10*3/uL (ref 4.0–10.5)
nRBC: 0 % (ref 0.0–0.2)

## 2021-01-03 LAB — BASIC METABOLIC PANEL
Anion gap: 8 (ref 5–15)
BUN: 31 mg/dL — ABNORMAL HIGH (ref 6–20)
CO2: 23 mmol/L (ref 22–32)
Calcium: 9 mg/dL (ref 8.9–10.3)
Chloride: 109 mmol/L (ref 98–111)
Creatinine, Ser: 1.65 mg/dL — ABNORMAL HIGH (ref 0.61–1.24)
GFR, Estimated: 52 mL/min — ABNORMAL LOW (ref >60)
Glucose, Bld: 110 mg/dL — ABNORMAL HIGH (ref 70–99)
Potassium: 4 mmol/L (ref 3.5–5.1)
Sodium: 140 mmol/L (ref 135–145)

## 2021-01-03 LAB — TROPONIN I (HIGH SENSITIVITY)
Troponin I (High Sensitivity): 76 ng/L — ABNORMAL HIGH (ref <18)
Troponin I (High Sensitivity): 81 ng/L — ABNORMAL HIGH (ref <18)

## 2021-01-03 LAB — BLOOD GAS, VENOUS
Acid-Base Excess: 2.3 mmol/L — ABNORMAL HIGH (ref 0.0–2.0)
Bicarbonate: 26.5 mmol/L (ref 20.0–28.0)
O2 Saturation: 85.1 %
Patient temperature: 37
pCO2, Ven: 39 mmHg — ABNORMAL LOW (ref 44.0–60.0)
pH, Ven: 7.44 — ABNORMAL HIGH (ref 7.250–7.430)
pO2, Ven: 48 mmHg — ABNORMAL HIGH (ref 32.0–45.0)

## 2021-01-03 LAB — BRAIN NATRIURETIC PEPTIDE: B Natriuretic Peptide: 394.3 pg/mL — ABNORMAL HIGH (ref 0.0–100.0)

## 2021-01-03 MED ORDER — NITROGLYCERIN 0.4 MG SL SUBL: 0.4000 mg | SUBLINGUAL_TABLET | Freq: Once | SUBLINGUAL | Status: AC

## 2021-01-03 MED ORDER — FUROSEMIDE 20 MG PO TABS: 20.0000 mg | ORAL_TABLET | Freq: Two times a day (BID) | ORAL | 0 refills | Status: DC

## 2021-01-03 MED ORDER — FUROSEMIDE 10 MG/ML IJ SOLN
40.0000 mg | Freq: Once | INTRAMUSCULAR | Status: AC
  Administered 2021-01-03: 40 mg via INTRAVENOUS
  Filled 2021-01-03: qty 4

## 2021-01-03 MED ORDER — NITROGLYCERIN 0.4 MG SL SUBL
SUBLINGUAL_TABLET | SUBLINGUAL | Status: AC
  Administered 2021-01-03: 0.4 mg via SUBLINGUAL
  Filled 2021-01-03: qty 1

## 2021-01-03 MED ORDER — ALBUTEROL SULFATE HFA 108 (90 BASE) MCG/ACT IN AERS
2.0000 | INHALATION_SPRAY | RESPIRATORY_TRACT | Status: DC | PRN
  Filled 2021-01-03 (×2): qty 6.7

## 2021-01-03 NOTE — ED Provider Notes (Signed)
[facility] Emergency Department Provider Note   ____________________________________________   Event Date/Time   First MD Initiated Contact with Patient 01/03/21 1507     (approximate)  I have reviewed the triage vital signs and the nursing notes.   HISTORY  Chief Complaint Shortness of Breath    HPI Brad [name] is a 45 y.o. male with past medical history of asthma, hypertension, CKD, and cocaine abuse who presents to the ED for shortness of breath.  Patient reports that he has developed increasing difficulty breathing over the past 3 to 4 days.  He particularly feels short of breath when he goes to lie flat or with exertion.  He denies any associated fevers, cough, or chest pain.  He has not noticed any pain or swelling in his legs.  He denies any history of heart problems or specifically CHF, does state that he has been intermittently compliant with his blood pressure medication.  He denies any recent cocaine abuse.        Past Medical History:  Diagnosis Date  . Asthma   . Diverticulitis   . Hypertension     Patient Active Problem List   Diagnosis Date Noted  . Cocaine abuse (HCC) 04/17/2020  . Pneumonia due to COVID-19 virus 04/16/2020  . Acute hypoxemic respiratory failure due to COVID-19 (HCC) 04/16/2020  . Benign essential HTN 04/16/2020  . CKD (chronic kidney disease), stage III (HCC) 04/16/2020  . Elevated troponin 04/16/2020  . AKI (acute kidney injury) (HCC)   . Diverticulitis 10/06/2018  . Hypertensive urgency 04/18/2016  . Intractable nausea and vomiting 05/31/2015    Past Surgical History:  Procedure Laterality Date  . ANKLE SURGERY      Prior to Admission medications   Medication Sig Start Date End Date Taking? Authorizing Provider  furosemide (LASIX) 20 MG tablet Take 1 tablet (20 mg total) by mouth 2 (two) times daily for 5 days. 01/03/21 01/08/21 Yes Chesley Noon, MD  albuterol (VENTOLIN HFA) 108 (90 Base)  MCG/ACT inhaler Inhale 2 puffs into the lungs every 6 (six) hours as needed for wheezing or shortness of breath. 04/26/20   Phineas Semen, MD  amLODipine (NORVASC) 10 MG tablet Take 1 tablet (10 mg total) by mouth daily. 04/19/20 05/19/20  Tresa Moore, MD  hydrALAZINE (APRESOLINE) 100 MG tablet Take 1 tablet (100 mg total) by mouth every 8 (eight) hours. 04/19/20 05/19/20  Tresa Moore, MD  ondansetron (ZOFRAN) 4 MG tablet Take 1 tablet (4 mg total) by mouth every 8 (eight) hours as needed. 04/26/20   Phineas Semen, MD    Allergies Patient has no known allergies.  Family History  Problem Relation Age of Onset  . Hypertension Mother     Social History Social History   Tobacco Use  . Smoking status: Current Every Day Smoker    Packs/day: 0.50    Types: Cigarettes  . Smokeless tobacco: Never Used  Substance Use Topics  . Alcohol use: Yes    Comment: occas  . Drug use: Yes    Types: Marijuana    Comment: daily    Review of Systems  Constitutional: No fever/chills Eyes: No visual changes. ENT: No sore throat. Cardiovascular: Denies chest pain. Respiratory: Positive for shortness of breath. Gastrointestinal: No abdominal pain.  No nausea, no vomiting.  No diarrhea.  No constipation. Genitourinary: Negative for dysuria. Musculoskeletal: Negative for back pain. Skin: Negative for rash. Neurological: Negative for headaches, focal weakness or numbness.  ____________________________________________  PHYSICAL EXAM:  VITAL SIGNS: ED Triage Vitals  Enc Vitals Group     BP 01/03/21 1408 (!) 165/103     Pulse Rate 01/03/21 1408 73     Resp 01/03/21 1408 (!) 21     Temp 01/03/21 1408 98 F (36.7 C)     Temp Source 01/03/21 1408 Oral     SpO2 01/03/21 1408 97 %     Weight 01/03/21 1412 269 lb (122 kg)     Height 01/03/21 1412 5\' 6"  (1.676 m)     Head Circumference --      Peak Flow --      Pain Score 01/03/21 1412 0     Pain Loc --      Pain Edu? --       Excl. in GC? --     Constitutional: Alert and oriented. Eyes: Conjunctivae are normal. Head: Atraumatic. Nose: No congestion/rhinnorhea. Mouth/Throat: Mucous membranes are moist. Neck: Normal ROM Cardiovascular: Normal rate, regular rhythm. Grossly normal heart sounds.  2+ radial pulses bilaterally. Respiratory: Tachypneic with mildly increased respiratory effort.  No retractions. Lungs with crackles to bilateral bases. Gastrointestinal: Soft and nontender. No distention. Genitourinary: deferred Musculoskeletal: No lower extremity tenderness nor edema. Neurologic:  Normal speech and language. No gross focal neurologic deficits are appreciated. Skin:  Skin is warm, dry and intact. No rash noted. Psychiatric: Mood and affect are normal. Speech and behavior are normal.  ____________________________________________   LABS (all labs ordered are listed, but only abnormal results are displayed)  Labs Reviewed  CBC - Abnormal; Notable for the following components:      Result Value   MCV 79.9 (*)    RDW 15.9 (*)    All other components within normal limits  BASIC METABOLIC PANEL - Abnormal; Notable for the following components:   Glucose, Bld 110 (*)    BUN 31 (*)    Creatinine, Ser 1.65 (*)    GFR, Estimated 52 (*)    All other components within normal limits  BRAIN NATRIURETIC PEPTIDE - Abnormal; Notable for the following components:   B Natriuretic Peptide 394.3 (*)    All other components within normal limits  BLOOD GAS, VENOUS - Abnormal; Notable for the following components:   pH, Ven 7.44 (*)    pCO2, Ven 39 (*)    pO2, Ven 48.0 (*)    Acid-Base Excess 2.3 (*)    All other components within normal limits  TROPONIN I (HIGH SENSITIVITY) - Abnormal; Notable for the following components:   Troponin I (High Sensitivity) 76 (*)    All other components within normal limits  TROPONIN I (HIGH SENSITIVITY) - Abnormal; Notable for the following components:   Troponin I (High  Sensitivity) 81 (*)    All other components within normal limits  SARS CORONAVIRUS 2 (TAT 6-24 HRS)   ____________________________________________  EKG  ED ECG REPORT I, 03/05/21, the attending physician, personally viewed and interpreted this ECG.   Date: 01/03/2021  EKG Time: 14:15  Rate: 78  Rhythm: normal sinus rhythm  Axis: Normal  Intervals:none  ST&T Change: Lateral T wave changes, similar to previous   PROCEDURES  Procedure(s) performed (including Critical Care):  Procedures   ____________________________________________   INITIAL IMPRESSION / ASSESSMENT AND PLAN / ED COURSE       45 year old male with possible history of hypertension, CKD, asthma, and cocaine abuse who presents to the ED for increasing dyspnea on exertion as well as orthopnea over the past 3 to  4 days.  He is tachypneic with mildly increased respiratory effort on my evaluation, but is maintaining O2 sats on room air.  EKG shows no evidence of arrhythmia, does show T wave changes laterally that is similar to previous.  Patient denies any chest pain and troponin is similar to his prior baseline, low suspicion for ACS.  Chest x-ray reviewed by me and does show findings concerning for pulmonary edema, patient additionally has mildly elevated BNP.  I am concerned his symptoms are consistent with new onset CHF, given his marked hypertension he was given a dose of nitroglycerin along with IV Lasix.  Patient reports feeling slightly better but remains tachypneic and with his new CHF we will discussed with hospitalist for admission.  Case discussed with hospitalist, Dr. Sherryll Burger, who feels that patient could be discharged home with close cardiology follow-up.  He spoke with Dr. Mariah Milling of cardiology, who will be able to see the patient either tomorrow or Monday, recommend starting patient on 5 days of Lasix twice daily.  Patient was ambulated on a pulse ox and had no drop in his O2 sats, respiratory rate does  appear to be normalizing.  He is appropriate for discharge home with close cardiology follow-up, was counseled on compliance with his antihypertensives, counseled to return to the ED for new or worsening symptoms.  Patient agrees with plan.      ____________________________________________   FINAL CLINICAL IMPRESSION(S) / ED DIAGNOSES  Final diagnoses:  New onset of congestive heart failure (HCC)  Shortness of breath  Uncontrolled hypertension     ED Discharge Orders         Ordered    furosemide (LASIX) 20 MG tablet  2 times daily        01/03/21 1824           Note:  This document was prepared using Dragon voice recognition software and may include unintentional dictation errors.   Chesley Noon, MD 01/03/21 [phone]

## 2021-01-03 NOTE — ED Triage Notes (Signed)
Pt states he felt like he had a cold and then has been more SOB. Pt states SOB is worse when he is lying down. Pt in  NAD at this time, no edema noted, breath sounds clear with some inspiratory wheezing.

## 2021-01-03 NOTE — ED Notes (Signed)
Pt to ED for SOB that started 3-4d ago. Feels like "can't get enough air". Felt like he could not lay on back. Is tolerating supine position right now and does not want to sit up higher. Denies CP.   Smokes 1 pack cigarettes/day for past 10 years. Pt has dry cough. Pt does not have visible swelling in lower extremeties. Pt denies cardiac or pulmonary problems.

## 2021-01-03 NOTE — ED Notes (Signed)
This RN ambulated patient around room with pulse oximetry. Pt demonstrated a steady gait, O2 saturation remained between 97-98% during ambulation on room air. Jess[place] MD made aware.

## 2021-01-03 NOTE — ED Notes (Signed)
Told EDP of pt respiratory status. EDP on way to see pt. New orders placed.

## 2021-01-04 ENCOUNTER — Telehealth: Payer: Self-pay | Admitting: Cardiovascular Disease

## 2021-01-04 NOTE — Telephone Encounter (Signed)
-----   Message from Antonieta Iba, MD sent at 01/03/2021  5:23 PM EDT ----- Needs new patient visit Any provider Seen by hospitalist service for high blood pressure, shortness of breath

## 2021-01-04 NOTE — Telephone Encounter (Signed)
Left message with family member to call back and schedule

## 2021-01-12 ENCOUNTER — Other Ambulatory Visit: Payer: Self-pay

## 2021-01-12 ENCOUNTER — Inpatient Hospital Stay
Admission: EM | Admit: 2021-01-12 | Discharge: 2021-01-13 | DRG: 291 | Disposition: A | Discharge status: Home / Self Care | Payer: Self-pay | Attending: Internal Medicine | Admitting: Internal Medicine

## 2021-01-12 ENCOUNTER — Emergency Department: Payer: Self-pay

## 2021-01-12 ENCOUNTER — Inpatient Hospital Stay: Payer: Self-pay

## 2021-01-12 DIAGNOSIS — F1410 Cocaine abuse, uncomplicated: Secondary | ICD-10-CM | POA: Diagnosis present

## 2021-01-12 DIAGNOSIS — I248 Other forms of acute ischemic heart disease: Secondary | ICD-10-CM | POA: Diagnosis present

## 2021-01-12 DIAGNOSIS — F17210 Nicotine dependence, cigarettes, uncomplicated: Secondary | ICD-10-CM | POA: Diagnosis present

## 2021-01-12 DIAGNOSIS — I160 Hypertensive urgency: Secondary | ICD-10-CM | POA: Diagnosis present

## 2021-01-12 DIAGNOSIS — J4520 Mild intermittent asthma, uncomplicated: Secondary | ICD-10-CM

## 2021-01-12 DIAGNOSIS — N1831 Chronic kidney disease, stage 3a: Secondary | ICD-10-CM | POA: Diagnosis present

## 2021-01-12 DIAGNOSIS — I130 Hypertensive heart and chronic kidney disease with heart failure and stage 1 through stage 4 chronic kidney disease, or unspecified chronic kidney disease: Principal | ICD-10-CM | POA: Diagnosis present

## 2021-01-12 DIAGNOSIS — Z20822 Contact with and (suspected) exposure to covid-19: Secondary | ICD-10-CM | POA: Diagnosis present

## 2021-01-12 DIAGNOSIS — J810 Acute pulmonary edema: Secondary | ICD-10-CM

## 2021-01-12 DIAGNOSIS — N183 Chronic kidney disease, stage 3 unspecified: Secondary | ICD-10-CM | POA: Diagnosis present

## 2021-01-12 DIAGNOSIS — R7989 Other specified abnormal findings of blood chemistry: Secondary | ICD-10-CM | POA: Diagnosis present

## 2021-01-12 DIAGNOSIS — R778 Other specified abnormalities of plasma proteins: Secondary | ICD-10-CM | POA: Diagnosis present

## 2021-01-12 DIAGNOSIS — I5033 Acute on chronic diastolic (congestive) heart failure: Secondary | ICD-10-CM | POA: Diagnosis present

## 2021-01-12 DIAGNOSIS — Z8249 Family history of ischemic heart disease and other diseases of the circulatory system: Secondary | ICD-10-CM

## 2021-01-12 DIAGNOSIS — J45909 Unspecified asthma, uncomplicated: Secondary | ICD-10-CM | POA: Diagnosis present

## 2021-01-12 DIAGNOSIS — Z8616 Personal history of COVID-19: Secondary | ICD-10-CM

## 2021-01-12 DIAGNOSIS — Z79899 Other long term (current) drug therapy: Secondary | ICD-10-CM

## 2021-01-12 DIAGNOSIS — Z720 Tobacco use: Secondary | ICD-10-CM | POA: Diagnosis present

## 2021-01-12 DIAGNOSIS — Z6841 Body Mass Index (BMI) 40.0 and over, adult: Secondary | ICD-10-CM

## 2021-01-12 DIAGNOSIS — I509 Heart failure, unspecified: Secondary | ICD-10-CM

## 2021-01-12 DIAGNOSIS — I10 Essential (primary) hypertension: Secondary | ICD-10-CM | POA: Diagnosis present

## 2021-01-12 LAB — HEPATIC FUNCTION PANEL
ALT: 17 U/L (ref 0–44)
AST: 21 U/L (ref 15–41)
Albumin: 3.4 g/dL — ABNORMAL LOW (ref 3.5–5.0)
Alkaline Phosphatase: 62 U/L (ref 38–126)
Bilirubin, Direct: <0.1 mg/dL (ref 0.0–0.2)
Total Bilirubin: 0.5 mg/dL (ref 0.3–1.2)
Total Protein: 6.7 g/dL (ref 6.5–8.1)

## 2021-01-12 LAB — CBC
HCT: 40.2 % (ref 39.0–52.0)
Hemoglobin: 12.8 g/dL — ABNORMAL LOW (ref 13.0–17.0)
MCH: 25.8 pg — ABNORMAL LOW (ref 26.0–34.0)
MCHC: 31.8 g/dL (ref 30.0–36.0)
MCV: 80.9 fL (ref 80.0–100.0)
Platelets: 265 10*3/uL (ref 150–400)
RBC: 4.97 MIL/uL (ref 4.22–5.81)
RDW: 16.1 % — ABNORMAL HIGH (ref 11.5–15.5)
WBC: 8 10*3/uL (ref 4.0–10.5)
nRBC: 0 % (ref 0.0–0.2)

## 2021-01-12 LAB — BASIC METABOLIC PANEL
Anion gap: 9 (ref 5–15)
BUN: 32 mg/dL — ABNORMAL HIGH (ref 6–20)
CO2: 23 mmol/L (ref 22–32)
Calcium: 9 mg/dL (ref 8.9–10.3)
Chloride: 108 mmol/L (ref 98–111)
Creatinine, Ser: 1.79 mg/dL — ABNORMAL HIGH (ref 0.61–1.24)
GFR, Estimated: 47 mL/min — ABNORMAL LOW (ref >60)
Glucose, Bld: 118 mg/dL — ABNORMAL HIGH (ref 70–99)
Potassium: 4 mmol/L (ref 3.5–5.1)
Sodium: 140 mmol/L (ref 135–145)

## 2021-01-12 LAB — RESP PANEL BY RT-PCR (FLU A&B, COVID) ARPGX2
Influenza A by PCR: NEGATIVE
Influenza B by PCR: NEGATIVE
SARS Coronavirus 2 by RT PCR: NEGATIVE

## 2021-01-12 LAB — PROTIME-INR
INR: 1 (ref 0.8–1.2)
Prothrombin Time: 13.6 seconds (ref 11.4–15.2)

## 2021-01-12 LAB — BLOOD GAS, VENOUS
Acid-Base Excess: 2 mmol/L (ref 0.0–2.0)
Bicarbonate: 27.3 mmol/L (ref 20.0–28.0)
O2 Saturation: 74.9 %
Patient temperature: 37
pCO2, Ven: 44 mmHg (ref 44.0–60.0)
pH, Ven: 7.4 (ref 7.250–7.430)
pO2, Ven: 40 mmHg (ref 32.0–45.0)

## 2021-01-12 LAB — URINE DRUG SCREEN, QUALITATIVE (ARMC ONLY)
Amphetamines, Ur Screen: NOT DETECTED
Barbiturates, Ur Screen: NOT DETECTED
Benzodiazepine, Ur Scrn: NOT DETECTED
Cannabinoid 50 Ng, Ur ~~LOC~~: NOT DETECTED
Cocaine Metabolite,Ur ~~LOC~~: POSITIVE — AB
MDMA (Ecstasy)Ur Screen: NOT DETECTED
Methadone Scn, Ur: NOT DETECTED
Opiate, Ur Screen: NOT DETECTED
Phencyclidine (PCP) Ur S: NOT DETECTED
Tricyclic, Ur Screen: NOT DETECTED

## 2021-01-12 LAB — TROPONIN I (HIGH SENSITIVITY)
Troponin I (High Sensitivity): 51 ng/L — ABNORMAL HIGH (ref <18)
Troponin I (High Sensitivity): 53 ng/L — ABNORMAL HIGH (ref <18)
Troponin I (High Sensitivity): 43 ng/L — ABNORMAL HIGH (ref <18)
Troponin I (High Sensitivity): 43 ng/L — ABNORMAL HIGH (ref <18)
Troponin I (High Sensitivity): 44 ng/L — ABNORMAL HIGH (ref <18)

## 2021-01-12 LAB — BRAIN NATRIURETIC PEPTIDE: B Natriuretic Peptide: 776.9 pg/mL — ABNORMAL HIGH (ref 0.0–100.0)

## 2021-01-12 LAB — TSH: TSH: 2.72 u[IU]/mL (ref 0.350–4.500)

## 2021-01-12 LAB — D-DIMER, QUANTITATIVE: D-Dimer, Quant: 0.98 ug/mL-FEU — ABNORMAL HIGH (ref 0.00–0.50)

## 2021-01-12 LAB — LIPASE, BLOOD: Lipase: 35 U/L (ref 11–51)

## 2021-01-12 MED ORDER — NICOTINE 21 MG/24HR TD PT24
21.0000 mg | MEDICATED_PATCH | Freq: Every day | TRANSDERMAL | Status: DC
  Administered 2021-01-12 – 2021-01-13 (×2): 21 mg via TRANSDERMAL
  Filled 2021-01-12 (×2): qty 1

## 2021-01-12 MED ORDER — ASPIRIN EC 81 MG PO TBEC
81.0000 mg | DELAYED_RELEASE_TABLET | Freq: Every day | ORAL | Status: DC
  Administered 2021-01-12 – 2021-01-13 (×2): 81 mg via ORAL
  Filled 2021-01-12 (×2): qty 1

## 2021-01-12 MED ORDER — AMLODIPINE BESYLATE 10 MG PO TABS
10.0000 mg | ORAL_TABLET | Freq: Every day | ORAL | Status: DC
  Administered 2021-01-12 – 2021-01-13 (×2): 10 mg via ORAL
  Filled 2021-01-12 (×2): qty 1

## 2021-01-12 MED ORDER — FUROSEMIDE 10 MG/ML IJ SOLN
40.0000 mg | Freq: Two times a day (BID) | INTRAMUSCULAR | Status: DC
  Administered 2021-01-12 – 2021-01-13 (×2): 40 mg via INTRAVENOUS
  Filled 2021-01-12 (×2): qty 4

## 2021-01-12 MED ORDER — ENOXAPARIN SODIUM 60 MG/0.6ML IJ SOSY
60.0000 mg | PREFILLED_SYRINGE | INTRAMUSCULAR | Status: DC
  Administered 2021-01-12: 60 mg via SUBCUTANEOUS
  Filled 2021-01-12 (×3): qty 0.6

## 2021-01-12 MED ORDER — ONDANSETRON HCL 4 MG/2ML IJ SOLN: 4.0000 mg | Freq: Three times a day (TID) | INTRAMUSCULAR | Status: DC | PRN

## 2021-01-12 MED ORDER — FUROSEMIDE 10 MG/ML IJ SOLN
40.0000 mg | Freq: Once | INTRAMUSCULAR | Status: AC
  Administered 2021-01-12: 40 mg via INTRAVENOUS
  Filled 2021-01-12: qty 4

## 2021-01-12 MED ORDER — NITROGLYCERIN 0.4 MG SL SUBL: 0.4000 mg | SUBLINGUAL_TABLET | SUBLINGUAL | Status: DC | PRN

## 2021-01-12 MED ORDER — ALBUTEROL SULFATE HFA 108 (90 BASE) MCG/ACT IN AERS
2.0000 | INHALATION_SPRAY | RESPIRATORY_TRACT | Status: DC | PRN
  Filled 2021-01-12: qty 6.7

## 2021-01-12 MED ORDER — HYDRALAZINE HCL 25 MG PO TABS
25.0000 mg | ORAL_TABLET | Freq: Three times a day (TID) | ORAL | Status: DC | PRN
  Filled 2021-01-12: qty 1

## 2021-01-12 MED ORDER — HYDRALAZINE HCL 20 MG/ML IJ SOLN
5.0000 mg | INTRAMUSCULAR | Status: DC | PRN
  Administered 2021-01-12 – 2021-01-13 (×3): 5 mg via INTRAVENOUS
  Filled 2021-01-12 (×3): qty 1

## 2021-01-12 MED ORDER — ACETAMINOPHEN 325 MG PO TABS: 650.0000 mg | ORAL_TABLET | Freq: Four times a day (QID) | ORAL | Status: DC | PRN

## 2021-01-12 MED ORDER — HYDRALAZINE HCL 20 MG/ML IJ SOLN
10.0000 mg | Freq: Once | INTRAMUSCULAR | Status: DC
  Filled 2021-01-12: qty 1

## 2021-01-12 MED ORDER — DM-GUAIFENESIN ER 30-600 MG PO TB12: 1.0000 | ORAL_TABLET | Freq: Two times a day (BID) | ORAL | Status: DC | PRN

## 2021-01-12 NOTE — ED Triage Notes (Addendum)
Pt reports shortness of breath that is worse while lying flat noted at nighttime when is trying to sleep. Lung sounds clear upon initial assessment. Pt reports associated intermittent L sided chest pain. Recently seen here for same c/o and states he was put on a 5 day course of medication with minimal relief.

## 2021-01-12 NOTE — Progress Notes (Signed)
PHARMACIST - PHYSICIAN COMMUNICATION  CONCERNING:  Enoxaparin (Lovenox) for DVT Prophylaxis    RECOMMENDATION: Patient was prescribed enoxaprin 40mg  q24 hours for VTE prophylaxis.   Filed Weights   01/12/21 0625  Weight: 122.5 kg (270 lb)    Body mass index is 43.58 kg/m.  Estimated Creatinine Clearance: 64.4 mL/min (A) (by C-G formula based on SCr of 1.79 mg/dL (H)).   Based on [facility] policy patient is candidate for enoxaparin 0.5mg /kg TBW SQ every 24 hours based on BMI being >30.  DESCRIPTION: Pharmacy has adjusted enoxaparin dose per [facility] policy.  Patient is now receiving enoxaparin 60 mg every 24 hours    Samarie Pinder, PharmD Clinical Pharmacist  01/12/2021 2:20 PM

## 2021-01-12 NOTE — H&P (Signed)
History and Physical    Brad [name]:[id number] DOB: [birth year] DOA: 01/12/2021  Referring MD/NP/PA:   PCP: Patient, No Pcp Per (Inactive)   Patient coming from:  The patient is coming from home.  At baseline, pt is independent for most of ADL.        Chief Complaint: SOB  HPI: Brad [name] is a 45 y.o. male with medical history significant of HTN, asthma, CKD-IIIa, tobacco abuse, cocaine abuse, diverticulitis, who presents with SOB.  Patient states that he has been having short of breath for more than 2 weeks, which has been progressively worsening.  Patient has mild dry cough, no fever or chills.  Patient has intermittent chest discomfort, but currently denies any chest pain.  Patient states that he cannot lay flat to sleep in the night, therefore he feels very sleepy during the daytime.  He denies nausea, vomiting, diarrhea or abdominal pain.  No symptoms of UTI.  No unilateral weakness.  Patient was seen in ED on 01/03/2021, and found to have possible CHF.  Patient was started on Lasix 20 mg twice a day, without improvement of his shortness breath. Pt is supposed to have outpatient cardiology follow-up, but he seems to have not done yet.  ED Course: pt was found to have troponin level 51, 53, BNP 776, negative COVID PCR, positive D-dimer 0.98, renal function close to baseline, temperature normal, elevated blood pressure 208/115, heart rate 57, RR 31, 21, current oxygen saturation 92-99% on room air.  Chest x-ray showed cardiomegaly and pulmonary edema.  Patient is admitted to progressive bed as inpatient.  Dr. Welton Flakes of cardiology is consulted  Review of Systems:   General: no fevers, chills, has body weight gain,  has fatigue HEENT: no blurry vision, hearing changes or sore throat Respiratory: Has dyspnea, coughing, no wheezing CV: Has intermittent chest discomfort, no palpitations GI: no nausea, vomiting, abdominal pain, diarrhea, constipation GU: no dysuria, burning on  urination, increased urinary frequency, hematuria  Ext: Has leg edema Neuro: no unilateral weakness, numbness, or tingling, no vision change or hearing loss Skin: no rash, no skin tear. MSK: No muscle spasm, no deformity, no limitation of range of movement in spin Heme: No easy bruising.  Travel history: No recent long distant travel.  Allergy: No Known Allergies  Past Medical History:  Diagnosis Date  . Asthma   . Diverticulitis   . Hypertension     Past Surgical History:  Procedure Laterality Date  . ANKLE SURGERY      Social History:  reports that he has been smoking cigarettes. He has been smoking about 0.50 packs per day. He has never used smokeless tobacco. He reports current alcohol use. He reports current drug use. Drug: Marijuana.  Family History:  Family History  Problem Relation Age of Onset  . Hypertension Mother      Prior to Admission medications   Medication Sig Start Date End Date Taking? Authorizing Provider  albuterol (VENTOLIN HFA) 108 (90 Base) MCG/ACT inhaler Inhale 2 puffs into the lungs every 6 (six) hours as needed for wheezing or shortness of breath. 04/26/20   Phineas Semen, MD  amLODipine (NORVASC) 10 MG tablet Take 1 tablet (10 mg total) by mouth daily. 04/19/20 05/19/20  Tresa Moore, MD  furosemide (LASIX) 20 MG tablet Take 1 tablet (20 mg total) by mouth 2 (two) times daily for 5 days. 01/03/21 01/08/21  Chesley Noon, MD  hydrALAZINE (APRESOLINE) 100 MG tablet Take 1 tablet (100 mg total)  by mouth every 8 (eight) hours. 04/19/20 05/19/20  Tresa Moore, MD  ondansetron (ZOFRAN) 4 MG tablet Take 1 tablet (4 mg total) by mouth every 8 (eight) hours as needed. 04/26/20   Phineas Semen, MD    Physical Exam: Vitals:   01/12/21 0830 01/12/21 0905 01/12/21 0930 01/12/21 1000  BP: (!) 195/124  (!) 220/113   Pulse: 63 (!) 57 64 61  Resp: (!) 31 (!) 21 19 17   Temp:      TempSrc:      SpO2: 96% 92% 93% 96%  Weight:      Height:        General: Not in acute distress HEENT:       Eyes: PERRL, EOMI, no scleral icterus.       ENT: No discharge from the ears and nose, no pharynx injection, no tonsillar enlargement.        Neck: No JVD, no bruit, no mass felt. Heme: No neck lymph node enlargement. Cardiac: S1/S2, RRR, No murmurs, No gallops or rubs. Respiratory: No rales, wheezing, rhonchi or rubs. GI: Soft, nondistended, nontender, no rebound pain, no organomegaly, BS present. GU: No hematuria Ext: 1+ pitting leg edema bilaterally. 1+DP/PT pulse bilaterally. Musculoskeletal: No joint deformities, No joint redness or warmth, no limitation of ROM in spin. Skin: No rashes.  Neuro: Alert, oriented X3, cranial nerves II-XII grossly intact, moves all extremities normally.  Psych: Patient is not psychotic, no suicidal or hemocidal ideation.  Labs on Admission: I have personally reviewed following labs and imaging studies  CBC: Recent Labs  Lab 01/12/21 0629  WBC 8.0  HGB 12.8*  HCT 40.2  MCV 80.9  PLT 265   Basic Metabolic Panel: Recent Labs  Lab 01/12/21 0629  NA 140  K 4.0  CL 108  CO2 23  GLUCOSE 118*  BUN 32*  CREATININE 1.79*  CALCIUM 9.0   GFR: Estimated Creatinine Clearance: 64.4 mL/min (A) (by C-G formula based on SCr of 1.79 mg/dL (H)). Liver Function Tests: Recent Labs  Lab 01/12/21 0629  AST 21  ALT 17  ALKPHOS 62  BILITOT 0.5  PROT 6.7  ALBUMIN 3.4*   Recent Labs  Lab 01/12/21 0629  LIPASE 35   No results for input(s): AMMONIA in the last 168 hours. Coagulation Profile: No results for input(s): INR, PROTIME in the last 168 hours. Cardiac Enzymes: No results for input(s): CKTOTAL, CKMB, CKMBINDEX, TROPONINI in the last 168 hours. BNP (last 3 results) No results for input(s): PROBNP in the last 8760 hours. HbA1C: No results for input(s): HGBA1C in the last 72 hours. CBG: No results for input(s): GLUCAP in the last 168 hours. Lipid Profile: No results for input(s): CHOL, HDL,  LDLCALC, TRIG, CHOLHDL, LDLDIRECT in the last 72 hours. Thyroid Function Tests: Recent Labs    01/12/21 0629  TSH 2.720   Anemia Panel: No results for input(s): VITAMINB12, FOLATE, FERRITIN, TIBC, IRON, RETICCTPCT in the last 72 hours. Urine analysis:    Component Value Date/Time   COLORURINE YELLOW (A) 12/08/2019 2101   APPEARANCEUR CLEAR (A) 12/08/2019 2101   APPEARANCEUR Clear 03/23/2012 1704   LABSPEC 1.018 12/08/2019 2101   LABSPEC 1.021 03/23/2012 1704   PHURINE 6.0 12/08/2019 2101   GLUCOSEU 50 (A) 12/08/2019 2101   GLUCOSEU Negative 03/23/2012 1704   HGBUR NEGATIVE 12/08/2019 2101   BILIRUBINUR NEGATIVE 12/08/2019 2101   BILIRUBINUR Negative 03/23/2012 1704   KETONESUR 5 (A) 12/08/2019 2101   PROTEINUR >=300 (A) 12/08/2019 2101   NITRITE  NEGATIVE 12/08/2019 2101   LEUKOCYTESUR NEGATIVE 12/08/2019 2101   LEUKOCYTESUR Negative 03/23/2012 1704   Sepsis Labs: @LABRCNTIP (procalcitonin:4,lacticidven:4) ) Recent Results (from the past 240 hour(s))  Resp Panel by RT-PCR (Flu A&B, Covid) Nasopharyngeal Swab     Status: None   Collection Time: 01/12/21  7:46 AM   Specimen: Nasopharyngeal Swab; Nasopharyngeal(NP) swabs in vial transport medium  Result Value Ref Range Status   SARS Coronavirus 2 by RT PCR NEGATIVE NEGATIVE Final    Comment: (NOTE) SARS-CoV-2 target nucleic acids are NOT DETECTED.  The SARS-CoV-2 RNA is generally detectable in upper respiratory specimens during the acute phase of infection. The lowest concentration of SARS-CoV-2 viral copies this assay can detect is 138 copies/mL. A negative result does not preclude SARS-Cov-2 infection and should not be used as the sole basis for treatment or other patient management decisions. A negative result may occur with  improper specimen collection/handling, submission of specimen other than nasopharyngeal swab, presence of viral mutation(s) within the areas targeted by this assay, and inadequate number of  viral copies(<138 copies/mL). A negative result must be combined with clinical observations, patient history, and epidemiological information. The expected result is Negative.  Fact Sheet for Patients:  BloggerCourse.comhttps://www.fda.gov/media/152166/download  Fact Sheet for Healthcare Providers:  SeriousBroker.ithttps://www.fda.gov/media/152162/download  This test is no t yet approved or cleared by the [place] FDA and  has been authorized for detection and/or diagnosis of SARS-CoV-2 by FDA under an Emergency Use Authorization (EUA). This EUA will remain  in effect (meaning this test can be used) for the duration of the COVID-19 declaration under Section 564(b)(1) of the Act, 21 U.S.C.section 360bbb-3(b)(1), unless the authorization is terminated  or revoked sooner.       Influenza A by PCR NEGATIVE NEGATIVE Final   Influenza B by PCR NEGATIVE NEGATIVE Final    Comment: (NOTE) The Xpert Xpress SARS-CoV-2/FLU/RSV plus assay is intended as an aid in the diagnosis of influenza from Nasopharyngeal swab specimens and should not be used as a sole basis for treatment. Nasal washings and aspirates are unacceptable for Xpert Xpress SARS-CoV-2/FLU/RSV testing.  Fact Sheet for Patients: BloggerCourse.comhttps://www.fda.gov/media/152166/download  Fact Sheet for Healthcare Providers: SeriousBroker.ithttps://www.fda.gov/media/152162/download  This test is not yet approved or cleared by the [place] FDA and has been authorized for detection and/or diagnosis of SARS-CoV-2 by FDA under an Emergency Use Authorization (EUA). This EUA will remain in effect (meaning this test can be used) for the duration of the COVID-19 declaration under Section 564(b)(1) of the Act, 21 U.S.C. section 360bbb-3(b)(1), unless the authorization is terminated or revoked.  Performed at [facility], [address] [place], [place] [address]      Radiological Exams on Admission: DG Chest 2 View  Result Date: 01/12/2021 CLINICAL DATA:  Shortness of  breath. EXAM: CHEST - 2 VIEW COMPARISON:  Jan 03, 2021 FINDINGS: Persistent cardiomegaly. Hila and mediastinum are normal. No pneumothorax. Opacities in the lung bases with Kerley B lines seen in the lateral left lung base is most consistent with worsening pulmonary edema. An infectious process is considered less likely. No other acute abnormalities. IMPRESSION: Findings are most consistent with cardiomegaly and pulmonary edema as above. The edema has worsened in the interval. Electronically Signed   By: Gerome Samavid  Williams III M.D   On: 01/12/2021 07:24     EKG: I have personally reviewed.  Sinus rhythm, QTC 444, LAE, T wave inversion in V4-V6, and lead II.  ST depression in the inferior leads. Assessment/Plan Principal Problem:   Acute  CHF (HCC) Active Problems:   Hypertensive urgency   CKD (chronic kidney disease), stage IIIa   Elevated troponin   Asthma   Hypertension   Tobacco abuse   Positive D-dimer   Acute CHF [facility]): Patient has leg edema, elevated BNP 776, chest x-ray showed cardiomegaly and pulmonary edema, clinically consistent with acute CHF.  Patient had a 2D echo on 04/19/2016 showed EF of 55-65%.  Given his long history of hypertension, patient may have acute diastolic CHF.  Dr. Welton Flakes of cardiology is consulted.  -Will admit to progressive unit as inpatient -Lasix 40 mg bid by IV -2d echo -Daily weights -strict I/O's -Low salt diet -Fluid restriction -Obtain REDs Vest reading  Hypertensive urgency and Hypertension: Bp 208/115. -IV hydralazine as needed -Patient is on IV Lasix -Continue home amlodipine, hydralazine  Elevated troponin: Troponin level 51, 53, likely due to demand ischemia -Trend troponin -Aspirin 81 mg daily -Check A1c, FLP -Follow-up 2D echo -UDS  CKD (chronic kidney disease), stage IIIa: Stable.  Recent baseline creatinine 1.5-1.8.  His creatinine is 1.79, BUN 32 -Follow-up with BMP  Asthma -As needed albuterol  Tobacco abuse -Nicotine  patch  Positive D-dimer: D-dimer 0.98.  Patient's shortness of breath can be explained by acute CHF.  Low suspicions for PE. -Lower extremity Doppler to rule out DVT     DVT ppx: SQ Lovenox Code Status: Full code Family Communication: not done, no family member is at bed side.    Disposition Plan:  Anticipate discharge back to previous environment Consults called: Dr. Welton Flakes for cardiology Admission status and Level of care: Progressive Cardiac:  as inpt     Status is: Inpatient  Remains inpatient appropriate because:Inpatient level of care appropriate due to severity of illness   Dispo: The patient is from: Home              Anticipated d/c is to: Home              Patient currently is not medically stable to d/c.   Difficult to place patient No         Date of Service 01/12/2021    Lorretta Harp [facility]   If 7PM-7AM, please contact night-coverage [email or web address] 01/12/2021, 10:49 AM

## 2021-01-12 NOTE — ED Notes (Signed)
Pt fiance given update with verbal permission from pt

## 2021-01-12 NOTE — ED Notes (Signed)
Per Clyde Lundborg, MD no BP meds at this time until SBP above 160.

## 2021-01-12 NOTE — Consult Note (Signed)
Brad [name] is a 45 y.o. male  [id number]  Primary Cardiologist: Adrian Blackwater Reason for Consultation: Shortness of breath and chest pain  HPI: Is a 45 year old African-American male with a history of asthma diverticulitis hypertension chronic renal insufficiency with creatinine around 1.8 presented to the hospital with shortness of breath orthopnea PND and chest pain.  Patient also uses cocaine intermittently.   Review of Systems: No further chest pain   Past Medical History:  Diagnosis Date  . Asthma   . Diverticulitis   . Hypertension     (Not in a hospital admission)    . furosemide  40 mg Intravenous Q12H  . hydrALAZINE  10 mg Intravenous Once  . nicotine  21 mg Transdermal Daily    Infusions:   No Known Allergies  Social History   Socioeconomic History  . Marital status: Single    Spouse name: Not on file  . Number of children: Not on file  . Years of education: Not on file  . Highest education level: Not on file  Occupational History  . Not on file  Tobacco Use  . Smoking status: Current Every Day Smoker    Packs/day: 0.50    Types: Cigarettes  . Smokeless tobacco: Never Used  Substance and Sexual Activity  . Alcohol use: Yes    Comment: occas  . Drug use: Yes    Types: Marijuana    Comment: daily  . Sexual activity: Not on file  Other Topics Concern  . Not on file  Social History Narrative  . Not on file   Social Determinants of Health   Financial Resource Strain: Not on file  Food Insecurity: Not on file  Transportation Needs: Not on file  Physical Activity: Not on file  Stress: Not on file  Social Connections: Not on file  Intimate Partner Violence: Not on file    Family History  Problem Relation Age of Onset  . Hypertension Mother     PHYSICAL EXAM: Vitals:   01/12/21 0930 01/12/21 1000  BP: (!) 220/113   Pulse: 64 61  Resp: 19 17  Temp:    SpO2: 93% 96%     Intake/Output Summary (Last 24 hours) at 01/12/2021  1041 Last data filed at 01/12/2021 1008 Gross per 24 hour  Intake --  Output 1300 ml  Net -1300 ml    General:  Well appearing. No respiratory difficulty HEENT: normal Neck: supple. no JVD. Carotids 2+ bilat; no bruits. No lymphadenopathy or thryomegaly appreciated. Cor: PMI nondisplaced. Regular rate & rhythm. No rubs, gallops or murmurs. Lungs: clear Abdomen: soft, nontender, nondistended. No hepatosplenomegaly. No bruits or masses. Good bowel sounds. Extremities: no cyanosis, clubbing, rash, edema Neuro: alert & oriented x 3, cranial nerves grossly intact. moves all 4 extremities w/o difficulty. Affect pleasant.  ECG: Sinus bradycardia with first-degree AV block and LVH noted to changes  Results for orders placed or performed during the hospital encounter of 01/12/21 (from the past 24 hour(s))  Basic metabolic panel     Status: Abnormal   Collection Time: 01/12/21  6:29 AM  Result Value Ref Range   Sodium 140 135 - 145 mmol/L   Potassium 4.0 3.5 - 5.1 mmol/L   Chloride 108 98 - 111 mmol/L   CO2 23 22 - 32 mmol/L   Glucose, Bld 118 (H) 70 - 99 mg/dL   BUN 32 (H) 6 - 20 mg/dL   Creatinine, Ser 1.27 (H) 0.61 - 1.24 mg/dL   Calcium  9.0 8.9 - 10.3 mg/dL   GFR, Estimated 47 (L) >60 mL/min   Anion gap 9 5 - 15  CBC     Status: Abnormal   Collection Time: 01/12/21  6:29 AM  Result Value Ref Range   WBC 8.0 4.0 - 10.5 K/uL   RBC 4.97 4.22 - 5.81 MIL/uL   Hemoglobin 12.8 (L) 13.0 - 17.0 g/dL   HCT 70.2 63.7 - 85.8 %   MCV 80.9 80.0 - 100.0 fL   MCH 25.8 (L) 26.0 - 34.0 pg   MCHC 31.8 30.0 - 36.0 g/dL   RDW 85.0 (H) 27.7 - 41.2 %   Platelets 265 150 - 400 K/uL   nRBC 0.0 0.0 - 0.2 %  Troponin I (High Sensitivity)     Status: Abnormal   Collection Time: 01/12/21  6:29 AM  Result Value Ref Range   Troponin I (High Sensitivity) 51 (H) <18 ng/L  D-dimer, quantitative     Status: Abnormal   Collection Time: 01/12/21  6:29 AM  Result Value Ref Range   D-Dimer, Quant 0.98 (H)  0.00 - 0.50 ug/mL-FEU  Hepatic function panel     Status: Abnormal   Collection Time: 01/12/21  6:29 AM  Result Value Ref Range   Total Protein 6.7 6.5 - 8.1 g/dL   Albumin 3.4 (L) 3.5 - 5.0 g/dL   AST 21 15 - 41 U/L   ALT 17 0 - 44 U/L   Alkaline Phosphatase 62 38 - 126 U/L   Total Bilirubin 0.5 0.3 - 1.2 mg/dL   Bilirubin, Direct <8.7 0.0 - 0.2 mg/dL   Indirect Bilirubin NOT CALCULATED 0.3 - 0.9 mg/dL  Lipase, blood     Status: None   Collection Time: 01/12/21  6:29 AM  Result Value Ref Range   Lipase 35 11 - 51 U/L  TSH     Status: None   Collection Time: 01/12/21  6:29 AM  Result Value Ref Range   TSH 2.720 0.350 - 4.500 uIU/mL  Brain natriuretic peptide     Status: Abnormal   Collection Time: 01/12/21  6:29 AM  Result Value Ref Range   B Natriuretic Peptide 776.9 (H) 0.0 - 100.0 pg/mL  Resp Panel by RT-PCR (Flu A&B, Covid) Nasopharyngeal Swab     Status: None   Collection Time: 01/12/21  7:46 AM   Specimen: Nasopharyngeal Swab; Nasopharyngeal(NP) swabs in vial transport medium  Result Value Ref Range   SARS Coronavirus 2 by RT PCR NEGATIVE NEGATIVE   Influenza A by PCR NEGATIVE NEGATIVE   Influenza B by PCR NEGATIVE NEGATIVE  Blood gas, venous     Status: None   Collection Time: 01/12/21  8:24 AM  Result Value Ref Range   pH, Ven 7.40 7.250 - 7.430   pCO2, Ven 44 44.0 - 60.0 mmHg   pO2, Ven 40.0 32.0 - 45.0 mmHg   Bicarbonate 27.3 20.0 - 28.0 mmol/L   Acid-Base Excess 2.0 0.0 - 2.0 mmol/L   O2 Saturation 74.9 %   Patient temperature 37.0    Collection site VEIN    Sample type VEIN   Troponin I (High Sensitivity)     Status: Abnormal   Collection Time: 01/12/21  8:29 AM  Result Value Ref Range   Troponin I (High Sensitivity) 53 (H) <18 ng/L  Urine Drug Screen, Qualitative ([facility] only)     Status: Abnormal   Collection Time: 01/12/21 10:04 AM  Result Value Ref Range   Tricyclic, Ur Screen NONE  DETECTED NONE DETECTED   Amphetamines, Ur Screen NONE DETECTED NONE  DETECTED   MDMA (Ecstasy)Ur Screen NONE DETECTED NONE DETECTED   Cocaine Metabolite,Ur [place] POSITIVE (A) NONE DETECTED   Opiate, Ur Screen NONE DETECTED NONE DETECTED   Phencyclidine (PCP) Ur S NONE DETECTED NONE DETECTED   Cannabinoid 50 Ng, Ur [place] NONE DETECTED NONE DETECTED   Barbiturates, Ur Screen NONE DETECTED NONE DETECTED   Benzodiazepine, Ur Scrn NONE DETECTED NONE DETECTED   Methadone Scn, Ur NONE DETECTED NONE DETECTED   DG Chest 2 View  Result Date: 01/12/2021 CLINICAL DATA:  Shortness of breath. EXAM: CHEST - 2 VIEW COMPARISON:  Jan 03, 2021 FINDINGS: Persistent cardiomegaly. Hila and mediastinum are normal. No pneumothorax. Opacities in the lung bases with Kerley B lines seen in the lateral left lung base is most consistent with worsening pulmonary edema. An infectious process is considered less likely. No other acute abnormalities. IMPRESSION: Findings are most consistent with cardiomegaly and pulmonary edema as above. The edema has worsened in the interval. Electronically Signed   By: Gerome Sam III M.D   On: 01/12/2021 07:24     ASSESSMENT AND PLAN: Congestive heart failure with chest x-ray showing evidence of pulmonary edema and hypoxia on blood gas.  Advise starting the patient on IV Lasix control blood pressure by treating with hydralazine and isosorbide nitrates.  Advise getting echocardiogram and if he goes into respiratory failure to intubate the patient.  Elevated troponin appears to be secondary to demand ischemia.  We will follow the patient closely with you.  Caedin Mogan A

## 2021-01-12 NOTE — Progress Notes (Signed)
PRN dose of IV hydralazine 5mg  given for blood pressure of 189/109.

## 2021-01-12 NOTE — ED Notes (Signed)
US at bedside

## 2021-01-12 NOTE — ED Notes (Signed)
Patient sleeping at this time. No signs of distress at this time.

## 2021-01-12 NOTE — Plan of Care (Signed)
  Problem: Education: Goal: Knowledge of General Education information will improve Description: Including pain rating scale, medication(s)/side effects and non-pharmacologic comfort measures 01/12/2021 1446 by Laurina Bustle, RN Outcome: Progressing 01/12/2021 1440 by Laurina Bustle, RN Outcome: Progressing   Problem: Clinical Measurements: Goal: Ability to maintain clinical measurements within normal limits will improve 01/12/2021 1446 by Laurina Bustle, RN Outcome: Progressing 01/12/2021 1440 by Laurina Bustle, RN Outcome: Progressing Goal: Will remain free from infection 01/12/2021 1446 by Laurina Bustle, RN Outcome: Progressing 01/12/2021 1440 by Laurina Bustle, RN Outcome: Progressing Goal: Diagnostic test results will improve 01/12/2021 1446 by Laurina Bustle, RN Outcome: Progressing 01/12/2021 1440 by Laurina Bustle, RN Outcome: Progressing Goal: Respiratory complications will improve 01/12/2021 1446 by Laurina Bustle, RN Outcome: Progressing 01/12/2021 1440 by Laurina Bustle, RN Outcome: Progressing Goal: Cardiovascular complication will be avoided 01/12/2021 1446 by Laurina Bustle, RN Outcome: Progressing 01/12/2021 1440 by Laurina Bustle, RN Outcome: Progressing   Problem: Pain Managment: Goal: General experience of comfort will improve 01/12/2021 1446 by Laurina Bustle, RN Outcome: Progressing 01/12/2021 1440 by Laurina Bustle, RN Outcome: Progressing   Problem: Safety: Goal: Ability to remain free from injury will improve 01/12/2021 1446 by Laurina Bustle, RN Outcome: Progressing 01/12/2021 1440 by Laurina Bustle, RN Outcome: Progressing   Problem: Skin Integrity: Goal: Risk for impaired skin integrity will decrease 01/12/2021 1446 by Laurina Bustle, RN Outcome: Progressing 01/12/2021 1440 by Laurina Bustle, RN Outcome: Progressing   Problem: Education: Goal: Ability to demonstrate management of disease process will improve Outcome: Progressing Goal: Ability to  verbalize understanding of medication therapies will improve Outcome: Progressing Goal: Individualized Educational Video(s) Outcome: Progressing   Problem: Activity: Goal: Capacity to carry out activities will improve Outcome: Progressing   Problem: Cardiac: Goal: Ability to achieve and maintain adequate cardiopulmonary perfusion will improve Outcome: Progressing

## 2021-01-12 NOTE — ED Provider Notes (Signed)
[facility] Emergency Department Provider Note  ____________________________________________  Time seen: Approximately 8:23 AM  I have reviewed the triage vital signs and the nursing notes.   HISTORY  Chief Complaint Shortness of Breath    HPI LUCY WOOLEVER is a 45 y.o. male with a history of hypertension, obesity, CKD, drug abuse who comes ED complaining of shortness of breath for the past 2 weeks.  He was seen in the ED 8 or 9 days ago, suspected to have new onset of heart failure, plan for close follow-up with cardiology and initiation of twice daily Lasix.  Patient states that he did take the Lasix but it did not help, did not follow-up with cardiology.  Denies any chest pain but just has ongoing shortness of breath, positive orthopnea, positive dyspnea on exertion.  Worsening.  No alleviating factors.  Constant.      Past Medical History:  Diagnosis Date  . Asthma   . Diverticulitis   . Hypertension      Patient Active Problem List   Diagnosis Date Noted  . Acute CHF (HCC) 01/12/2021  . Cocaine abuse (HCC) 04/17/2020  . Pneumonia due to COVID-19 virus 04/16/2020  . Acute hypoxemic respiratory failure due to COVID-19 (HCC) 04/16/2020  . Benign essential HTN 04/16/2020  . CKD (chronic kidney disease), stage III (HCC) 04/16/2020  . Elevated troponin 04/16/2020  . AKI (acute kidney injury) (HCC)   . Diverticulitis 10/06/2018  . Hypertensive urgency 04/18/2016  . Intractable nausea and vomiting 05/31/2015     Past Surgical History:  Procedure Laterality Date  . ANKLE SURGERY       Prior to Admission medications   Medication Sig Start Date End Date Taking? Authorizing Provider  albuterol (VENTOLIN HFA) 108 (90 Base) MCG/ACT inhaler Inhale 2 puffs into the lungs every 6 (six) hours as needed for wheezing or shortness of breath. 04/26/20   Phineas Semen, MD  amLODipine (NORVASC) 10 MG tablet Take 1 tablet (10 mg total) by mouth daily.  04/19/20 05/19/20  Tresa Moore, MD  furosemide (LASIX) 20 MG tablet Take 1 tablet (20 mg total) by mouth 2 (two) times daily for 5 days. 01/03/21 01/08/21  Chesley Noon, MD  hydrALAZINE (APRESOLINE) 100 MG tablet Take 1 tablet (100 mg total) by mouth every 8 (eight) hours. 04/19/20 05/19/20  Tresa Moore, MD  ondansetron (ZOFRAN) 4 MG tablet Take 1 tablet (4 mg total) by mouth every 8 (eight) hours as needed. 04/26/20   Phineas Semen, MD     Allergies Patient has no known allergies.   Family History  Problem Relation Age of Onset  . Hypertension Mother     Social History Social History   Tobacco Use  . Smoking status: Current Every Day Smoker    Packs/day: 0.50    Types: Cigarettes  . Smokeless tobacco: Never Used  Substance Use Topics  . Alcohol use: Yes    Comment: occas  . Drug use: Yes    Types: Marijuana    Comment: daily    Review of Systems  Constitutional:   No fever or chills.  ENT:   No sore throat. No rhinorrhea. Cardiovascular:   No chest pain or syncope. Respiratory: Positive shortness of breath without cough. Gastrointestinal:   Negative for abdominal pain, vomiting and diarrhea.  Musculoskeletal: Positive leg swelling All other systems reviewed and are negative except as documented above in ROS and HPI.  ____________________________________________   PHYSICAL EXAM:  VITAL SIGNS: ED Triage Vitals  Enc Vitals Group     BP 01/12/21 0633 (!) 181/101     Pulse Rate 01/12/21 0625 61     Resp 01/12/21 0625 20     Temp 01/12/21 0625 98 F (36.7 C)     Temp Source 01/12/21 0625 Oral     SpO2 01/12/21 0625 96 %     Weight 01/12/21 0625 270 lb (122.5 kg)     Height 01/12/21 0625 5\' 6"  (1.676 m)     Head Circumference --      Peak Flow --      Pain Score 01/12/21 0625 0     Pain Loc --      Pain Edu? --      Excl. in GC? --     Vital signs reviewed, nursing assessments reviewed.   Constitutional:   Somnolent but arousable.   Ill-appearing. Eyes:   Conjunctivae are normal. EOMI. PERRL. ENT      Head:   Normocephalic and atraumatic.      Nose: Normal.      Mouth/Throat: Normal      Neck:   No meningismus. Full ROM. Hematological/Lymphatic/Immunilogical:   No cervical lymphadenopathy. Cardiovascular:   RRR. Symmetric bilateral radial and DP pulses.  No murmurs. Cap refill less than 2 seconds. Respiratory:   Normal respiratory effort without tachypnea/retractions. Breath sounds are clear and equal bilaterally. No wheezes/rales/rhonchi. On exam patient exhibits somnolence, sleep apnea, room air oxygen saturation of 83%. Gastrointestinal:   Soft and nontender. Non distended. There is no CVA tenderness.  No rebound, rigidity, or guarding. Genitourinary:   deferred Musculoskeletal:   Normal range of motion in all extremities. No joint effusions.  No lower extremity tenderness.  1+ pitting edema bilateral lower extremities Neurologic:   Normal speech and language.  Motor grossly intact. No acute focal neurologic deficits are appreciated.  Skin:    Skin is warm, dry and intact. No rash noted.  No petechiae, purpura, or bullae.  ____________________________________________    LABS (pertinent positives/negatives) (all labs ordered are listed, but only abnormal results are displayed) Labs Reviewed  BASIC METABOLIC PANEL - Abnormal; Notable for the following components:      Result Value   Glucose, Bld 118 (*)    BUN 32 (*)    Creatinine, Ser 1.79 (*)    GFR, Estimated 47 (*)    All other components within normal limits  CBC - Abnormal; Notable for the following components:   Hemoglobin 12.8 (*)    MCH 25.8 (*)    RDW 16.1 (*)    All other components within normal limits  D-DIMER, QUANTITATIVE - Abnormal; Notable for the following components:   D-Dimer, Quant 0.98 (*)    All other components within normal limits  HEPATIC FUNCTION PANEL - Abnormal; Notable for the following components:   Albumin 3.4 (*)     All other components within normal limits  BRAIN NATRIURETIC PEPTIDE - Abnormal; Notable for the following components:   B Natriuretic Peptide 776.9 (*)    All other components within normal limits  TROPONIN I (HIGH SENSITIVITY) - Abnormal; Notable for the following components:   Troponin I (High Sensitivity) 51 (*)    All other components within normal limits  TROPONIN I (HIGH SENSITIVITY) - Abnormal; Notable for the following components:   Troponin I (High Sensitivity) 53 (*)    All other components within normal limits  RESP PANEL BY RT-PCR (FLU A&B, COVID) ARPGX2  LIPASE, BLOOD  TSH  BLOOD GAS, VENOUS  ____________________________________________   EKG  Interpreted by me Sinus rhythm rate of 63.  Normal axis.  First-degree AV block.  Poor R wave progression.  Lateral T wave inversions likely indicative of strain. ____________________________________________    RADIOLOGY  DG Chest 2 View  Result Date: 01/12/2021 CLINICAL DATA:  Shortness of breath. EXAM: CHEST - 2 VIEW COMPARISON:  Jan 03, 2021 FINDINGS: Persistent cardiomegaly. Hila and mediastinum are normal. No pneumothorax. Opacities in the lung bases with Kerley B lines seen in the lateral left lung base is most consistent with worsening pulmonary edema. An infectious process is considered less likely. No other acute abnormalities. IMPRESSION: Findings are most consistent with cardiomegaly and pulmonary edema as above. The edema has worsened in the interval. Electronically Signed   By: Gerome Sam III M.D   On: 01/12/2021 07:24    ____________________________________________   PROCEDURES Procedures  ____________________________________________  DIFFERENTIAL DIAGNOSIS   Congestive heart failure, non-STEMI, pneumonia, pleural effusion, pneumothorax  CLINICAL IMPRESSION / ASSESSMENT AND PLAN / ED COURSE  Medications ordered in the ED: Medications  furosemide (LASIX) injection 40 mg (40 mg Intravenous Given  01/12/21 0744)    Pertinent labs & imaging results that were available during my care of the patient were reviewed by me and considered in my medical decision making (see chart for details).  LANGDON CROSSON was evaluated in Emergency Department on 01/12/2021 for the symptoms described in the history of present illness. He was evaluated in the context of the global COVID-19 pandemic, which necessitated consideration that the patient might be at risk for infection with the SARS-CoV-2 virus that causes COVID-19. Institutional protocols and algorithms that pertain to the evaluation of patients at risk for COVID-19 are in a state of rapid change based on information released by regulatory bodies including the CDC and federal and state organizations. These policies and algorithms were followed during the patient's care in the ED.   Patient presents with continued shortness of breath and worsening symptoms despite trial of outpatient therapy with Lasix.  No STEMI, doubt unstable angina.  Doubt PE dissection aneurysm or pericardial effusion.  Chest x-ray viewed and interpreted by me, shows pulmonary edema.  Will give IV Lasix.  Labs show slight elevation of creatinine and troponin consistent with chronic baseline.  Will need to admit for further management and cardiac work-up given failure of outpatient treatment and hypoxia.  Clinical Course as of 01/12/21 8299  Sat Jan 12, 2021  0920 Labs, cxr, sx clearly c/w CHF. D-dimer elevated but low suspicion of PE, would not CTA at this time. Will consult hospitalist.  [PS]    Clinical Course User Index [PS] Sharman Cheek, MD     ____________________________________________   FINAL CLINICAL IMPRESSION(S) / ED DIAGNOSES    Final diagnoses:  Acute pulmonary edema (HCC)  Morbid obesity (HCC)  Stage 3a chronic kidney disease [facility])     ED Discharge Orders    None      Portions of this note were generated with dragon dictation software.  Dictation errors may occur despite best attempts at proofreading.   Sharman Cheek, MD 01/12/21 [id number]

## 2021-01-12 NOTE — Plan of Care (Signed)
?  Problem: Education: ?Goal: Knowledge of General Education information will improve ?Description: Including pain rating scale, medication(s)/side effects and non-pharmacologic comfort measures ?Outcome: Progressing ?  ?Problem: Clinical Measurements: ?Goal: Ability to maintain clinical measurements within normal limits will improve ?Outcome: Progressing ?Goal: Will remain free from infection ?Outcome: Progressing ?Goal: Diagnostic test results will improve ?Outcome: Progressing ?Goal: Respiratory complications will improve ?Outcome: Progressing ?Goal: Cardiovascular complication will be avoided ?Outcome: Progressing ?  ?Problem: Pain Managment: ?Goal: General experience of comfort will improve ?Outcome: Progressing ?  ?Problem: Safety: ?Goal: Ability to remain free from injury will improve ?Outcome: Progressing ?  ?Problem: Skin Integrity: ?Goal: Risk for impaired skin integrity will decrease ?Outcome: Progressing ?  ?

## 2021-01-13 ENCOUNTER — Inpatient Hospital Stay
Admit: 2021-01-13 | Discharge: 2021-01-13 | Disposition: A | Discharge status: Home / Self Care | Payer: Self-pay | Attending: Cardiovascular Disease | Admitting: Cardiovascular Disease

## 2021-01-13 ENCOUNTER — Inpatient Hospital Stay: Admit: 2021-01-13 | Payer: Self-pay

## 2021-01-13 DIAGNOSIS — I5031 Acute diastolic (congestive) heart failure: Secondary | ICD-10-CM

## 2021-01-13 DIAGNOSIS — J810 Acute pulmonary edema: Secondary | ICD-10-CM

## 2021-01-13 LAB — ECHOCARDIOGRAM COMPLETE
AR max vel: 3.83 cm2
AV Peak grad: 7 mmHg
Ao pk vel: 1.32 m/s
Area-P 1/2: 2.92 cm2
Height: 66 in
S' Lateral: 3.93 cm
Weight: 4320 oz

## 2021-01-13 LAB — LIPID PANEL
Cholesterol: 253 mg/dL — ABNORMAL HIGH (ref 0–200)
HDL: 51 mg/dL (ref >40)
LDL Cholesterol: 169 mg/dL — ABNORMAL HIGH (ref 0–99)
Total CHOL/HDL Ratio: 5 RATIO
Triglycerides: 164 mg/dL — ABNORMAL HIGH (ref <150)
VLDL: 33 mg/dL (ref 0–40)

## 2021-01-13 LAB — HEMOGLOBIN A1C
Hgb A1c MFr Bld: 6.7 % — ABNORMAL HIGH (ref 4.8–5.6)
Mean Plasma Glucose: 145.59 mg/dL

## 2021-01-13 LAB — BASIC METABOLIC PANEL
Anion gap: 11 (ref 5–15)
BUN: 27 mg/dL — ABNORMAL HIGH (ref 6–20)
CO2: 24 mmol/L (ref 22–32)
Calcium: 9.2 mg/dL (ref 8.9–10.3)
Chloride: 105 mmol/L (ref 98–111)
Creatinine, Ser: 1.66 mg/dL — ABNORMAL HIGH (ref 0.61–1.24)
GFR, Estimated: 51 mL/min — ABNORMAL LOW (ref >60)
Glucose, Bld: 112 mg/dL — ABNORMAL HIGH (ref 70–99)
Potassium: 3.6 mmol/L (ref 3.5–5.1)
Sodium: 140 mmol/L (ref 135–145)

## 2021-01-13 LAB — MAGNESIUM: Magnesium: 2.2 mg/dL (ref 1.7–2.4)

## 2021-01-13 LAB — HIV ANTIBODY (ROUTINE TESTING W REFLEX): HIV Screen 4th Generation wRfx: NONREACTIVE

## 2021-01-13 MED ORDER — FUROSEMIDE 40 MG PO TABS: 40.0000 mg | ORAL_TABLET | Freq: Every day | ORAL | 11 refills | Status: DC

## 2021-01-13 MED ORDER — SODIUM CHLORIDE 0.9 % IV SOLN: INTRAVENOUS | Status: DC | PRN

## 2021-01-13 MED ORDER — AMLODIPINE BESYLATE 10 MG PO TABS: 10.0000 mg | ORAL_TABLET | Freq: Every day | ORAL | 1 refills | Status: DC

## 2021-01-13 MED ORDER — FAMOTIDINE IN NACL 20-0.9 MG/50ML-% IV SOLN: 20.0000 mg | Freq: Once | INTRAVENOUS | Status: DC | PRN

## 2021-01-13 MED ORDER — ROSUVASTATIN CALCIUM 20 MG PO TABS: 20.0000 mg | ORAL_TABLET | Freq: Every day | ORAL | 11 refills | Status: DC

## 2021-01-13 MED ORDER — EPINEPHRINE 0.3 MG/0.3ML IJ SOAJ
0.3000 mg | Freq: Once | INTRAMUSCULAR | Status: DC | PRN
  Filled 2021-01-13: qty 0.3

## 2021-01-13 MED ORDER — METHYLPREDNISOLONE SODIUM SUCC 125 MG IJ SOLR: 125.0000 mg | Freq: Once | INTRAMUSCULAR | Status: DC | PRN

## 2021-01-13 MED ORDER — DIPHENHYDRAMINE HCL 50 MG/ML IJ SOLN: 50.0000 mg | Freq: Once | INTRAMUSCULAR | Status: DC | PRN

## 2021-01-13 MED ORDER — ALBUTEROL SULFATE HFA 108 (90 BASE) MCG/ACT IN AERS: 2.0000 | INHALATION_SPRAY | RESPIRATORY_TRACT | 1 refills | Status: DC | PRN

## 2021-01-13 MED ORDER — HYDRALAZINE HCL 25 MG PO TABS: 25.0000 mg | ORAL_TABLET | Freq: Three times a day (TID) | ORAL | 1 refills | Status: DC

## 2021-01-13 MED ORDER — ENOXAPARIN SODIUM 80 MG/0.8ML IJ SOSY: 0.5000 mg/kg | PREFILLED_SYRINGE | INTRAMUSCULAR | Status: DC

## 2021-01-13 MED ORDER — NICOTINE 21 MG/24HR TD PT24: 21.0000 mg | MEDICATED_PATCH | Freq: Every day | TRANSDERMAL | 0 refills | Status: DC

## 2021-01-13 MED ORDER — HYDRALAZINE HCL 25 MG PO TABS: 25.0000 mg | ORAL_TABLET | Freq: Three times a day (TID) | ORAL | Status: DC

## 2021-01-13 MED ORDER — SODIUM CHLORIDE 0.9 % IV SOLN
100.0000 mg | Freq: Once | INTRAVENOUS | Status: DC
  Filled 2021-01-13: qty 20

## 2021-01-13 MED ORDER — ASPIRIN 81 MG PO TBEC: 81.0000 mg | DELAYED_RELEASE_TABLET | Freq: Every day | ORAL | 11 refills | Status: DC

## 2021-01-13 NOTE — Consult Note (Signed)
SUBJECTIVE: Patient still short of breath   Vitals:   01/13/21 0621 01/13/21 0627 01/13/21 0738 01/13/21 0809  BP: (!) 168/114 (!) 146/103 (!) 163/103 (!) 145/109  Pulse: (!) 59 60 62 63  Resp: 18 20 16 16   Temp:  98 F (36.7 C) 98 F (36.7 C)   TempSrc:  Oral    SpO2: 100% 100% 99% 92%  Weight:      Height:        Intake/Output Summary (Last 24 hours) at 01/13/2021 1221 Last data filed at 01/13/2021 0940 Gross per 24 hour  Intake 960 ml  Output 2600 ml  Net -1640 ml    LABS: Basic Metabolic Panel: Recent Labs    01/12/21 0629 01/13/21 0614  NA 140 140  K 4.0 3.6  CL 108 105  CO2 23 24  GLUCOSE 118* 112*  BUN 32* 27*  CREATININE 1.79* 1.66*  CALCIUM 9.0 9.2  MG  --  2.2   Liver Function Tests: Recent Labs    01/12/21 0629  AST 21  ALT 17  ALKPHOS 62  BILITOT 0.5  PROT 6.7  ALBUMIN 3.4*   Recent Labs    01/12/21 0629  LIPASE 35   CBC: Recent Labs    01/12/21 0629  WBC 8.0  HGB 12.8*  HCT 40.2  MCV 80.9  PLT 265   Cardiac Enzymes: No results for input(s): CKTOTAL, CKMB, CKMBINDEX, TROPONINI in the last 72 hours. BNP: Invalid input(s): POCBNP D-Dimer: Recent Labs    01/12/21 0629  DDIMER 0.98*   Hemoglobin A1C: No results for input(s): HGBA1C in the last 72 hours. Fasting Lipid Panel: Recent Labs    01/13/21 0614  CHOL 253*  HDL 51  LDLCALC 169*  TRIG 164*  CHOLHDL 5.0   Thyroid Function Tests: Recent Labs    01/12/21 0629  TSH 2.720   Anemia Panel: No results for input(s): VITAMINB12, FOLATE, FERRITIN, TIBC, IRON, RETICCTPCT in the last 72 hours.   PHYSICAL EXAM General: Well developed, well nourished, in no acute distress HEENT:  Normocephalic and atramatic Neck:  No JVD.  Lungs: Clear bilaterally to auscultation and percussion. Heart: HRRR . Normal S1 and S2 without gallops or murmurs.  Abdomen: Bowel sounds are positive, abdomen soft and non-tender  Msk:  Back normal, normal gait. Normal strength and tone for  age. Extremities: No clubbing, cyanosis or edema.   Neuro: Alert and oriented X 3. Psych:  Good affect, responds appropriately  TELEMETRY: Normal sinus rhythm  ASSESSMENT AND PLAN: Congestive heart failure due to hypertensive heart disease with renal insufficiency and elevated troponin secondary to demand ischemia.  Continue Lasix.  Add hydralazine and nitrates to control blood pressure.  Echo is still pending.  Principal Problem:   Acute CHF (HCC) Active Problems:   Hypertensive urgency   CKD (chronic kidney disease), stage IIIa   Elevated troponin   Asthma   Hypertension   Tobacco abuse   Positive D-dimer    01/14/21 A, MD, [facility] 01/13/2021 12:21 PM

## 2021-01-13 NOTE — Progress Notes (Signed)
*  PRELIMINARY RESULTS* Echocardiogram 2D Echocardiogram has been performed.  Brad [name] 01/13/2021, 1:38 PM

## 2021-01-13 NOTE — Progress Notes (Signed)
Instructions reviewed with the patient. He requested a letter for work. Dr Nelson Chimes notified and said I could write a letter that states the patient can return to work in 1 week.

## 2021-01-13 NOTE — Progress Notes (Signed)
  Jan 13, 2021  Patient: Brad [name]  Date of Birth: March 25, 1976  Date of Visit: 01/12/2021    To Whom It May Concern:  [name] was admitted to [facility] from May 21 -Jan 13, 2021. Per Dr Nelson Chimes, he can return to work on Jan 21, 2021.    Sincerely,    [place] Geophysicist/field seismologist Nurse [phone]

## 2021-01-13 NOTE — Progress Notes (Signed)
CCMD notified me that the patient's heart rate dropped to 41 and he had two different runs of junctional escape beats

## 2021-01-13 NOTE — Discharge Summary (Signed)
Physician Discharge Summary  Brad [name] WUJ:[id number] DOB: [birth year] DOA: 01/12/2021  PCP: Patient, No Pcp Per (Inactive)  Admit date: 01/12/2021 Discharge date: 01/13/2021  Admitted From: Home Disposition: Home  Recommendations for Outpatient Follow-up:  1. Follow up with PCP in 1-2 weeks 2. Follow-up with cardiology in 1 week 3. Please obtain BMP/CBC in one week 4. Please follow up on the following pending results: Echocardiogram official results  Home Health: No Equipment/Devices: None Discharge Condition: Stable CODE STATUS: Full Diet recommendation: Heart Healthy / Carb Modified   Brief/Interim Summary: Brad BadderFrank L [name] is a 45 y.o. male with medical history significant of HTN, asthma, CKD-IIIa, tobacco abuse, cocaine abuse, diverticulitis, who presents with SOB.  Patient states that he has been having short of breath for more than 2 weeks, which has been progressively worsening.  Patient has mild dry cough, no fever or chills.  Patient has intermittent chest discomfort, but currently denies any chest pain.  Patient states that he cannot lay flat to sleep in the night, therefore he feels very sleepy during the daytime.  He denies nausea, vomiting, diarrhea or abdominal pain.  No symptoms of UTI.  No unilateral weakness.  Patient was seen in ED on 01/03/2021, and found to have possible CHF.  Patient was started on Lasix 20 mg twice a day, without improvement of his shortness breath. Pt is supposed to have outpatient cardiology follow-up, but he seems to have not done yet. Patient was found to have BNP of 776, troponin positive in low 50s with a flat curve, no chest pain.  Lipid panel with elevated total cholesterol, triglyceride and LDL of 169 with a goal of 70. UDS was positive for cocaine.  Chest x-ray with cardiomegaly and pulmonary edema. Patient received IV diuresis while in the hospital, diuresed very well, resulted in improvement in his symptoms.  Clinically appears  euvolemic. Echocardiogram with normal EF and grade 1 diastolic dysfunction.  Patient needs to control his blood pressure and have a close follow-up with cardiology and PCP.  Patient was discharged home on new medications which include amlodipine, hydralazine, Lasix, aspirin and a statin and will follow up with cardiology closely for further recommendations as an outpatient.  Discharge Diagnoses:  Principal Problem:   Acute CHF (HCC) Active Problems:   Hypertensive urgency   CKD (chronic kidney disease), stage IIIa   Elevated troponin   Asthma   Hypertension   Tobacco abuse   Positive D-dimer   Acute pulmonary edema [facility](HCC)   Discharge Instructions  Discharge Instructions    Diet - low sodium heart healthy   Complete by: As directed    Discharge instructions   Complete by: As directed    It was pleasure taking care of you. You are being started on Lasix , Crestor, hydralazine and amlodipine, please take it as directed. It is very important to keep your blood pressure under good control. Stay away from cocaine and other street drugs. Follow-up with your cardiologist within a week.   Increase activity slowly   Complete by: As directed      Allergies as of 01/13/2021   No Known Allergies     Medication List    TAKE these medications   albuterol 108 (90 Base) MCG/ACT inhaler Commonly known as: VENTOLIN HFA Inhale 2 puffs into the lungs every 4 (four) hours as needed for wheezing or shortness of breath.   amLODipine 10 MG tablet Commonly known as: NORVASC Take 1 tablet (10 mg total) by mouth daily. Start  taking on: Jan 14, 2021   aspirin 81 MG EC tablet Take 1 tablet (81 mg total) by mouth daily. Swallow whole. Start taking on: Jan 14, 2021   furosemide 40 MG tablet Commonly known as: Lasix Take 1 tablet (40 mg total) by mouth daily. What changed:   medication strength  how much to take  when to take this   hydrALAZINE 25 MG tablet Commonly known as:  APRESOLINE Take 1 tablet (25 mg total) by mouth every 8 (eight) hours.   nicotine 21 mg/24hr patch Commonly known as: NICODERM CQ - dosed in mg/24 hours Place 1 patch (21 mg total) onto the skin daily. Start taking on: Jan 14, 2021   rosuvastatin 20 MG tablet Commonly known as: CRESTOR Take 1 tablet (20 mg total) by mouth daily.       Follow-up Information    Laurier Nancy, MD. Schedule an appointment as soon as possible for a visit in 1 week(s).   Specialty: Cardiology Contact information: [address] [place] [address] [phone]              No Known Allergies  Consultations:  Cardiology  Procedures/Studies: DG Chest 2 View  Result Date: 01/12/2021 CLINICAL DATA:  Shortness of breath. EXAM: CHEST - 2 VIEW COMPARISON:  Jan 03, 2021 FINDINGS: Persistent cardiomegaly. Hila and mediastinum are normal. No pneumothorax. Opacities in the lung bases with Kerley B lines seen in the lateral left lung base is most consistent with worsening pulmonary edema. An infectious process is considered less likely. No other acute abnormalities. IMPRESSION: Findings are most consistent with cardiomegaly and pulmonary edema as above. The edema has worsened in the interval. Electronically Signed   By: Gerome Sam III M.D   On: 01/12/2021 07:24   DG Chest 2 View  Result Date: 01/03/2021 CLINICAL DATA:  Shortness of breath EXAM: CHEST - 2 VIEW COMPARISON:  Chest radiograph dated 04/26/2020. FINDINGS: The heart is enlarged. Mild bilateral lower lung predominant interstitial opacities are noted. There is no pleural effusion or pneumothorax. The osseous structures are intact. IMPRESSION: Mild bilateral lower lung predominant interstitial opacities may represent pulmonary edema and/or pneumonia. Cardiomegaly. Electronically Signed   By: Romona Curls M.D.   On: 01/03/2021 14:58   US Venous Img Lower Bilateral (DVT)  Result Date: 01/12/2021 CLINICAL DATA:  Positive D-dimer. EXAM:  BILATERAL LOWER EXTREMITY VENOUS DOPPLER ULTRASOUND TECHNIQUE: Gray-scale sonography with compression, as well as color and duplex ultrasound, were performed to evaluate the deep venous system(s) from the level of the common femoral vein through the popliteal and proximal calf veins. COMPARISON:  None. FINDINGS: VENOUS Normal compressibility of the common femoral, superficial femoral, and popliteal veins, as well as the visualized calf veins. Visualized portions of profunda femoral vein and great saphenous vein unremarkable. No filling defects to suggest DVT on grayscale or color Doppler imaging. Doppler waveforms show normal direction of venous flow, normal respiratory plasticity and response to augmentation. Limited views of the contralateral common femoral vein are unremarkable. OTHER None. Limitations: none IMPRESSION: Negative. Electronically Signed   By: Gerome Sam III M.D   On: 01/12/2021 13:21   ECHOCARDIOGRAM COMPLETE  Result Date: 01/13/2021    ECHOCARDIOGRAM REPORT   Patient Name:   ASCHER SCHROEPFER Date of Exam: 01/13/2021 Medical Rec #:  [id number]        Height:       66.0 in Accession #:    [id number]       Weight:  270.0 lb Date of Birth:  12-30-1975        BSA:          2.272 m Patient Age:    45 years         BP:           145/109 mmHg Patient Gender: M                HR:           68 bpm. Exam Location:  [facility] Procedure: 2D Echo and Strain Analysis Indications:     CHF I50.31  History:         Patient has prior history of Echocardiogram examinations, most                  recent 04/19/2016.  Sonographer:     Overton Mam RDCS Referring Phys:  Lajuana Carry A KHAN Diagnosing Phys: Adrian Blackwater MD  Sonographer Comments: Global longitudinal strain was attempted. IMPRESSIONS  1. Left ventricular ejection fraction, by estimation, is 60 to 65%. The left ventricle has normal function. The left ventricle has no regional wall motion abnormalities. There is severe concentric left ventricular  hypertrophy. Left ventricular diastolic  parameters are consistent with Grade I diastolic dysfunction (impaired relaxation).  2. Right ventricular systolic function is normal. The right ventricular size is normal.  3. The mitral valve is normal in structure. No evidence of mitral valve regurgitation. No evidence of mitral stenosis.  4. The aortic valve is normal in structure. Aortic valve regurgitation is not visualized. No aortic stenosis is present.  5. The inferior vena cava is normal in size with greater than 50% respiratory variability, suggesting right atrial pressure of 3 mmHg. FINDINGS  Left Ventricle: Left ventricular ejection fraction, by estimation, is 60 to 65%. The left ventricle has normal function. The left ventricle has no regional wall motion abnormalities. The left ventricular internal cavity size was normal in size. There is  severe concentric left ventricular hypertrophy. Left ventricular diastolic parameters are consistent with Grade I diastolic dysfunction (impaired relaxation). Right Ventricle: The right ventricular size is normal. No increase in right ventricular wall thickness. Right ventricular systolic function is normal. Left Atrium: Left atrial size was normal in size. Right Atrium: Right atrial size was normal in size. Pericardium: There is no evidence of pericardial effusion. Mitral Valve: The mitral valve is normal in structure. No evidence of mitral valve regurgitation. No evidence of mitral valve stenosis. Tricuspid Valve: The tricuspid valve is normal in structure. Tricuspid valve regurgitation is not demonstrated. No evidence of tricuspid stenosis. Aortic Valve: The aortic valve is normal in structure. Aortic valve regurgitation is not visualized. No aortic stenosis is present. Aortic valve peak gradient measures 7.0 mmHg. Pulmonic Valve: The pulmonic valve was normal in structure. Pulmonic valve regurgitation is not visualized. No evidence of pulmonic stenosis. Aorta: The aortic  root is normal in size and structure. Venous: The inferior vena cava is normal in size with greater than 50% respiratory variability, suggesting right atrial pressure of 3 mmHg. IAS/Shunts: No atrial level shunt detected by color flow Doppler.  LEFT VENTRICLE PLAX 2D LVIDd:         5.89 cm  Diastology LVIDs:         3.93 cm  LV e' medial:    4.13 cm/s LV PW:         1.67 cm  LV E/e' medial:  8.8 LV IVS:        1.68 cm  LV e' lateral:   2.83 cm/s LVOT diam:     2.50 cm  LV E/e' lateral: 12.9 LV SV:         87 LV SV Index:   38 LVOT Area:     4.91 cm  RIGHT VENTRICLE RV Basal diam:  2.41 cm RV S prime:     16.80 cm/s TAPSE (M-mode): 2.6 cm LEFT ATRIUM              Index       RIGHT ATRIUM           Index LA diam:        5.40 cm  2.38 cm/m  RA Area:     16.10 cm LA Vol (A2C):   102.0 ml 44.90 ml/m RA Volume:   37.50 ml  16.51 ml/m LA Vol (A4C):   157.0 ml 69.11 ml/m LA Biplane Vol: 131.0 ml 57.66 ml/m  AORTIC VALVE                PULMONIC VALVE AV Area (Vmax): 3.83 cm    PV Vmax:       1.30 m/s AV Vmax:        132.00 cm/s PV Peak grad:  6.8 mmHg AV Peak Grad:   7.0 mmHg LVOT Vmax:      103.00 cm/s LVOT Vmean:     65.500 cm/s LVOT VTI:       0.178 m  AORTA Ao Root diam: 4.10 cm Ao Asc diam:  3.60 cm MITRAL VALVE MV Area (PHT): 2.92 cm    SHUNTS MV Decel Time: 260 msec    Systemic VTI:  0.18 m MV E velocity: 36.40 cm/s  Systemic Diam: 2.50 cm MV A velocity: 42.00 cm/s MV E/A ratio:  0.87 Adrian Blackwater MD Electronically signed by Adrian Blackwater MD Signature Date/Time: 01/13/2021/2:29:33 PM    Final     Subjective: Patient was seen and examined today.  He was feeling better.  Denies any chest pain or shortness of breath while he was resting comfortably.  Clinically appears euvolemic.  Discharge Exam: Vitals:   01/13/21 0809 01/13/21 1332  BP: (!) 145/109 (!) 185/121  Pulse: 63 72  Resp: 16 16  Temp:  98.6 F (37 C)  SpO2: 92% 100%   Vitals:   01/13/21 0627 01/13/21 0738 01/13/21 0809 01/13/21 1332   BP: (!) 146/103 (!) 163/103 (!) 145/109 (!) 185/121  Pulse: 60 62 63 72  Resp: 20 16 16 16   Temp: 98 F (36.7 C) 98 F (36.7 C)  98.6 F (37 C)  TempSrc: Oral   Oral  SpO2: 100% 99% 92% 100%  Weight:      Height:        General: Pt is alert, awake, not in acute distress Cardiovascular: RRR, S1/S2 +, no rubs, no gallops Respiratory: CTA bilaterally, no wheezing, no rhonchi Abdominal: Soft, NT, ND, bowel sounds + Extremities: no edema, no cyanosis   The results of significant diagnostics from this hospitalization (including imaging, microbiology, ancillary and laboratory) are listed below for reference.    Microbiology: Recent Results (from the past 240 hour(s))  Resp Panel by RT-PCR (Flu A&B, Covid) Nasopharyngeal Swab     Status: None   Collection Time: 01/12/21  7:46 AM   Specimen: Nasopharyngeal Swab; Nasopharyngeal(NP) swabs in vial transport medium  Result Value Ref Range Status   SARS Coronavirus 2 by RT PCR NEGATIVE NEGATIVE Final    Comment: (NOTE) SARS-CoV-2 target nucleic acids are NOT  DETECTED.  The SARS-CoV-2 RNA is generally detectable in upper respiratory specimens during the acute phase of infection. The lowest concentration of SARS-CoV-2 viral copies this assay can detect is 138 copies/mL. A negative result does not preclude SARS-Cov-2 infection and should not be used as the sole basis for treatment or other patient management decisions. A negative result may occur with  improper specimen collection/handling, submission of specimen other than nasopharyngeal swab, presence of viral mutation(s) within the areas targeted by this assay, and inadequate number of viral copies(<138 copies/mL). A negative result must be combined with clinical observations, patient history, and epidemiological information. The expected result is Negative.  Fact Sheet for Patients:  BloggerCourse.com  Fact Sheet for Healthcare Providers:   SeriousBroker.it  This test is no t yet approved or cleared by the [place] FDA and  has been authorized for detection and/or diagnosis of SARS-CoV-2 by FDA under an Emergency Use Authorization (EUA). This EUA will remain  in effect (meaning this test can be used) for the duration of the COVID-19 declaration under Section 564(b)(1) of the Act, 21 U.S.C.section 360bbb-3(b)(1), unless the authorization is terminated  or revoked sooner.       Influenza A by PCR NEGATIVE NEGATIVE Final   Influenza B by PCR NEGATIVE NEGATIVE Final    Comment: (NOTE) The Xpert Xpress SARS-CoV-2/FLU/RSV plus assay is intended as an aid in the diagnosis of influenza from Nasopharyngeal swab specimens and should not be used as a sole basis for treatment. Nasal washings and aspirates are unacceptable for Xpert Xpress SARS-CoV-2/FLU/RSV testing.  Fact Sheet for Patients: BloggerCourse.com  Fact Sheet for Healthcare Providers: SeriousBroker.it  This test is not yet approved or cleared by the [place] FDA and has been authorized for detection and/or diagnosis of SARS-CoV-2 by FDA under an Emergency Use Authorization (EUA). This EUA will remain in effect (meaning this test can be used) for the duration of the COVID-19 declaration under Section 564(b)(1) of the Act, 21 U.S.C. section 360bbb-3(b)(1), unless the authorization is terminated or revoked.  Performed at [facility], [address] [place], [place] [address]      Labs: BNP (last 3 results) Recent Labs    01/03/21 1417 01/12/21 0629  BNP 394.3* 776.9*   Basic Metabolic Panel: Recent Labs  Lab 01/12/21 0629 01/13/21 0614  NA 140 140  K 4.0 3.6  CL 108 105  CO2 23 24  GLUCOSE 118* 112*  BUN 32* 27*  CREATININE 1.79* 1.66*  CALCIUM 9.0 9.2  MG  --  2.2   Liver Function Tests: Recent Labs  Lab 01/12/21 0629  AST 21  ALT 17   ALKPHOS 62  BILITOT 0.5  PROT 6.7  ALBUMIN 3.4*   Recent Labs  Lab 01/12/21 0629  LIPASE 35   No results for input(s): AMMONIA in the last 168 hours. CBC: Recent Labs  Lab 01/12/21 0629  WBC 8.0  HGB 12.8*  HCT 40.2  MCV 80.9  PLT 265   Cardiac Enzymes: No results for input(s): CKTOTAL, CKMB, CKMBINDEX, TROPONINI in the last 168 hours. BNP: Invalid input(s): POCBNP CBG: No results for input(s): GLUCAP in the last 168 hours. D-Dimer Recent Labs    01/12/21 0629  DDIMER 0.98*   Hgb A1c No results for input(s): HGBA1C in the last 72 hours. Lipid Profile Recent Labs    01/13/21 0614  CHOL 253*  HDL 51  LDLCALC 169*  TRIG 164*  CHOLHDL 5.0   Thyroid function studies Recent Labs  01/12/21 0629  TSH 2.720   Anemia work up No results for input(s): VITAMINB12, FOLATE, FERRITIN, TIBC, IRON, RETICCTPCT in the last 72 hours. Urinalysis    Component Value Date/Time   COLORURINE YELLOW (A) 12/08/2019 2101   APPEARANCEUR CLEAR (A) 12/08/2019 2101   APPEARANCEUR Clear 03/23/2012 1704   LABSPEC 1.018 12/08/2019 2101   LABSPEC 1.021 03/23/2012 1704   PHURINE 6.0 12/08/2019 2101   GLUCOSEU 50 (A) 12/08/2019 2101   GLUCOSEU Negative 03/23/2012 1704   HGBUR NEGATIVE 12/08/2019 2101   BILIRUBINUR NEGATIVE 12/08/2019 2101   BILIRUBINUR Negative 03/23/2012 1704   KETONESUR 5 (A) 12/08/2019 2101   PROTEINUR >=300 (A) 12/08/2019 2101   NITRITE NEGATIVE 12/08/2019 2101   LEUKOCYTESUR NEGATIVE 12/08/2019 2101   LEUKOCYTESUR Negative 03/23/2012 1704   Sepsis Labs Invalid input(s): PROCALCITONIN,  WBC,  LACTICIDVEN Microbiology Recent Results (from the past 240 hour(s))  Resp Panel by RT-PCR (Flu A&B, Covid) Nasopharyngeal Swab     Status: None   Collection Time: 01/12/21  7:46 AM   Specimen: Nasopharyngeal Swab; Nasopharyngeal(NP) swabs in vial transport medium  Result Value Ref Range Status   SARS Coronavirus 2 by RT PCR NEGATIVE NEGATIVE Final    Comment:  (NOTE) SARS-CoV-2 target nucleic acids are NOT DETECTED.  The SARS-CoV-2 RNA is generally detectable in upper respiratory specimens during the acute phase of infection. The lowest concentration of SARS-CoV-2 viral copies this assay can detect is 138 copies/mL. A negative result does not preclude SARS-Cov-2 infection and should not be used as the sole basis for treatment or other patient management decisions. A negative result may occur with  improper specimen collection/handling, submission of specimen other than nasopharyngeal swab, presence of viral mutation(s) within the areas targeted by this assay, and inadequate number of viral copies(<138 copies/mL). A negative result must be combined with clinical observations, patient history, and epidemiological information. The expected result is Negative.  Fact Sheet for Patients:  BloggerCourse.com  Fact Sheet for Healthcare Providers:  SeriousBroker.it  This test is no t yet approved or cleared by the [place] FDA and  has been authorized for detection and/or diagnosis of SARS-CoV-2 by FDA under an Emergency Use Authorization (EUA). This EUA will remain  in effect (meaning this test can be used) for the duration of the COVID-19 declaration under Section 564(b)(1) of the Act, 21 U.S.C.section 360bbb-3(b)(1), unless the authorization is terminated  or revoked sooner.       Influenza A by PCR NEGATIVE NEGATIVE Final   Influenza B by PCR NEGATIVE NEGATIVE Final    Comment: (NOTE) The Xpert Xpress SARS-CoV-2/FLU/RSV plus assay is intended as an aid in the diagnosis of influenza from Nasopharyngeal swab specimens and should not be used as a sole basis for treatment. Nasal washings and aspirates are unacceptable for Xpert Xpress SARS-CoV-2/FLU/RSV testing.  Fact Sheet for Patients: BloggerCourse.com  Fact Sheet for Healthcare  Providers: SeriousBroker.it  This test is not yet approved or cleared by the [place] FDA and has been authorized for detection and/or diagnosis of SARS-CoV-2 by FDA under an Emergency Use Authorization (EUA). This EUA will remain in effect (meaning this test can be used) for the duration of the COVID-19 declaration under Section 564(b)(1) of the Act, 21 U.S.C. section 360bbb-3(b)(1), unless the authorization is terminated or revoked.  Performed at [facility], [address], [place], [place] [address]     Time coordinating discharge: Over 30 minutes  SIGNED:  Arnetha Courser, MD  [facility] 01/13/2021, 2:30 PM  If 7PM-7AM, please contact night-coverage [email or web address]  This record has been created using Systems analyst. Errors have been sought and corrected,but may not always be located. Such creation errors do not reflect on the standard of care.

## 2021-01-28 NOTE — Progress Notes (Signed)
Patient ID: Brad [name], male    DOB: [birth year], 45 y.o.   MRN: [id number]  HPI  Mr Dauphinais is a 45 y/o male with a history of HTN, tobacco use, substance abuse and chronic heart failure.   Echo report from 01/13/21 reviewed and showed an EF of 60-65% along with severe LVH.   Admitted 01/12/21 due to shortness of breath. Cardiology consult obtained. UDS + for cocaine. Initially given IV lasix with transition to oral diuretics. Discharged the following day. Was in the ED 01/03/21 due to shortness where he was treated and released.   He presents today for his initial visit with a chief complaint of moderate fatigue upon minimal exertion. He describes this as chronic in nature having been present for several months. He has associated chest tightness, shortness of breath, pedal edema, palpitations, snoring and apneic episodes along with this. He denies any difficulty sleeping, abdominal distention, chest pain, cough, dizziness or weight gain.   He says that he feels very fatigued after he takes his morning medications and has only been taking the hydralazine twice a day.   Not adding salt but does work at [employer] and eats there during work.   Has upcoming PCP appointment on 02/15/21 in [place] but hasn't made cardiology appointment yet. Currently uninsured.   Past Medical History:  Diagnosis Date  . Asthma   . CHF (congestive heart failure) (HCC)   . Diverticulitis   . Hypertension    Past Surgical History:  Procedure Laterality Date  . ANKLE SURGERY     Family History  Problem Relation Age of Onset  . Hypertension Mother    Social History   Tobacco Use  . Smoking status: Current Every Day Smoker    Packs/day: 0.50    Types: Cigarettes  . Smokeless tobacco: Never Used  Substance Use Topics  . Alcohol use: Yes    Comment: occas   No Known Allergies Prior to Admission medications   Medication Sig Start Date End Date Taking? Authorizing Provider  albuterol (VENTOLIN HFA) 108  (90 Base) MCG/ACT inhaler Inhale 2 puffs into the lungs every 4 (four) hours as needed for wheezing or shortness of breath. 01/13/21  Yes Arnetha Courser, MD  amLODipine (NORVASC) 10 MG tablet Take 1 tablet (10 mg total) by mouth daily. 01/14/21  Yes Arnetha Courser, MD  aspirin EC 81 MG EC tablet Take 1 tablet (81 mg total) by mouth daily. Swallow whole. 01/14/21  Yes Arnetha Courser, MD  furosemide (LASIX) 40 MG tablet Take 1 tablet (40 mg total) by mouth daily. 01/13/21 01/13/22 Yes Arnetha Courser, MD  hydrALAZINE (APRESOLINE) 25 MG tablet Take 1 tablet (25 mg total) by mouth twice a day 01/13/21  Yes Arnetha Courser, MD  rosuvastatin (CRESTOR) 20 MG tablet Take 1 tablet (20 mg total) by mouth daily. 01/13/21 01/13/22 Yes Arnetha Courser, MD  nicotine (NICODERM CQ - DOSED IN MG/24 HOURS) 21 mg/24hr patch Place 1 patch (21 mg total) onto the skin daily. Patient not taking: Reported on 01/29/2021 01/14/21   Arnetha Courser, MD    Review of Systems  Constitutional: Positive for fatigue (easily). Negative for appetite change.  HENT: Negative for congestion, postnasal drip and sore throat.   Eyes: Negative.   Respiratory: Positive for apnea, chest tightness and shortness of breath. Negative for cough.        + snoring  Cardiovascular: Positive for palpitations (if stressed) and leg swelling. Negative for chest pain.  Gastrointestinal: Negative for abdominal distention and  abdominal pain.  Endocrine: Negative.   Genitourinary: Negative.   Musculoskeletal: Negative for back pain and neck pain.  Skin: Negative.   Allergic/Immunologic: Negative.   Neurological: Negative for dizziness and light-headedness.  Hematological: Negative for adenopathy. Does not bruise/bleed easily.  Psychiatric/Behavioral: Negative for dysphoric mood and sleep disturbance (sleeping on 1 pillow although elevated). The patient is not nervous/anxious.     Vitals:   01/29/21 1228  BP: (!) 159/96  Pulse: 100  Resp: 20  SpO2: 97%  Weight:  282 lb (127.9 kg)  Height: 5\' 6"  (1.676 m)   Wt Readings from Last 3 Encounters:  01/29/21 282 lb (127.9 kg)  01/12/21 270 lb (122.5 kg)  01/03/21 269 lb (122 kg)   Lab Results  Component Value Date   CREATININE 1.66 (H) 01/13/2021   CREATININE 1.79 (H) 01/12/2021   CREATININE 1.65 (H) 01/03/2021   Physical Exam Vitals and nursing note reviewed. Exam conducted with a chaperone present (fiance').  Constitutional:      Appearance: Normal appearance.  HENT:     Head: Normocephalic and atraumatic.  Cardiovascular:     Rate and Rhythm: Regular rhythm. Tachycardia present.  Pulmonary:     Effort: Pulmonary effort is normal. No respiratory distress.     Breath sounds: No wheezing or rales.  Abdominal:     General: There is no distension.     Palpations: Abdomen is soft.     Tenderness: There is no abdominal tenderness.  Musculoskeletal:        General: No tenderness.     Cervical back: Normal range of motion and neck supple.     Right lower leg: No edema.     Left lower leg: Edema (trace pitting) present.  Skin:    General: Skin is warm and dry.  Neurological:     General: No focal deficit present.     Mental Status: He is alert and oriented to person, place, and time.  Psychiatric:        Mood and Affect: Mood normal.        Behavior: Behavior normal.        Thought Content: Thought content normal.     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)- - NYHA class III - euvolemic today - weighing daily and says that his weight has been stable; instructed to call for an overnight weight gain of > 2 pounds or a weekly weight gain of > 5 pounds - does not add salt to his food but does eat at [employer] while at work; reviewed the importance of limiting eating at fast food places and reading food labels for sodium content - low sodium cookbook given and explained that he needed to rinse any canned foods before  using - scheduled cardiology appt with Dr. 03/05/2021 for 02/04/21 - BNP 01/12/21 was 776.9 - PharmD reconciled medications with the patient - brochure given for [facility]; patient says they may stop over there after leaving here to get NP paperwork  2: HTN with CKD- - BP elevated today (159/96) - will stop hydralazine as he's only been taking it BID - move amlodipine to bedtime - begin losartan/ HCTZ (50/12.5mg ) daily - BMP 01/13/21 reviewed and showed sodium 140, potassium 3.6, creatinine 1.66 and GFR 51 - BMP/CBC drawn today  3: Tobacco use- - smoking 2-3 cigarettes daily - drinks alcohol on social occasions only (birthday/ holiday) - uses marijuana - denies any recent cocaine;  last use was last month  4: Snoring- - fiance' notes patient snores as well as has apneic episodes - patient says that he wakes himself up which then scares him and he's finding it stressful to now go to sleep because of this fear - most likely needs sleep study but will defer to PCP due to patient currently being uninsured and will need financial assistance   Patient did not bring his medications nor a list. Each medication was verbally reviewed with the patient and he was encouraged to bring the bottles to every visit to confirm accuracy of list.  Return in 3 weeks or sooner for any questions/problems before then.

## 2021-01-29 ENCOUNTER — Other Ambulatory Visit
Admission: RE | Admit: 2021-01-29 | Discharge: 2021-01-29 | Disposition: A | Discharge status: Home / Self Care | Payer: Self-pay | Source: Ambulatory Visit | Attending: Family | Admitting: Family

## 2021-01-29 ENCOUNTER — Encounter: Payer: Self-pay | Admitting: Pharmacist

## 2021-01-29 ENCOUNTER — Encounter: Payer: Self-pay | Admitting: Family

## 2021-01-29 ENCOUNTER — Other Ambulatory Visit: Payer: Self-pay

## 2021-01-29 ENCOUNTER — Ambulatory Visit: Payer: Self-pay | Admitting: Family

## 2021-01-29 ENCOUNTER — Ambulatory Visit: Payer: Self-pay | Attending: Family | Admitting: Family

## 2021-01-29 VITALS — BP 159/96 | HR 100 | Resp 20 | Ht 66.0 in | Wt 282.0 lb

## 2021-01-29 DIAGNOSIS — Z7901 Long term (current) use of anticoagulants: Secondary | ICD-10-CM | POA: Insufficient documentation

## 2021-01-29 DIAGNOSIS — I130 Hypertensive heart and chronic kidney disease with heart failure and stage 1 through stage 4 chronic kidney disease, or unspecified chronic kidney disease: Secondary | ICD-10-CM | POA: Insufficient documentation

## 2021-01-29 DIAGNOSIS — Z7982 Long term (current) use of aspirin: Secondary | ICD-10-CM | POA: Insufficient documentation

## 2021-01-29 DIAGNOSIS — I10 Essential (primary) hypertension: Secondary | ICD-10-CM

## 2021-01-29 DIAGNOSIS — Z8249 Family history of ischemic heart disease and other diseases of the circulatory system: Secondary | ICD-10-CM | POA: Insufficient documentation

## 2021-01-29 DIAGNOSIS — F17210 Nicotine dependence, cigarettes, uncomplicated: Secondary | ICD-10-CM | POA: Insufficient documentation

## 2021-01-29 DIAGNOSIS — R5383 Other fatigue: Secondary | ICD-10-CM | POA: Insufficient documentation

## 2021-01-29 DIAGNOSIS — Z79899 Other long term (current) drug therapy: Secondary | ICD-10-CM | POA: Insufficient documentation

## 2021-01-29 DIAGNOSIS — R002 Palpitations: Secondary | ICD-10-CM | POA: Insufficient documentation

## 2021-01-29 DIAGNOSIS — N189 Chronic kidney disease, unspecified: Secondary | ICD-10-CM | POA: Insufficient documentation

## 2021-01-29 DIAGNOSIS — Z01812 Encounter for preprocedural laboratory examination: Secondary | ICD-10-CM | POA: Insufficient documentation

## 2021-01-29 DIAGNOSIS — I5032 Chronic diastolic (congestive) heart failure: Secondary | ICD-10-CM | POA: Insufficient documentation

## 2021-01-29 DIAGNOSIS — R0789 Other chest pain: Secondary | ICD-10-CM | POA: Insufficient documentation

## 2021-01-29 DIAGNOSIS — Z720 Tobacco use: Secondary | ICD-10-CM

## 2021-01-29 DIAGNOSIS — R0683 Snoring: Secondary | ICD-10-CM | POA: Insufficient documentation

## 2021-01-29 LAB — BASIC METABOLIC PANEL
Anion gap: 7 (ref 5–15)
BUN: 29 mg/dL — ABNORMAL HIGH (ref 6–20)
CO2: 26 mmol/L (ref 22–32)
Calcium: 9.4 mg/dL (ref 8.9–10.3)
Chloride: 106 mmol/L (ref 98–111)
Creatinine, Ser: 1.76 mg/dL — ABNORMAL HIGH (ref 0.61–1.24)
GFR, Estimated: 48 mL/min — ABNORMAL LOW (ref >60)
Glucose, Bld: 101 mg/dL — ABNORMAL HIGH (ref 70–99)
Potassium: 3.8 mmol/L (ref 3.5–5.1)
Sodium: 139 mmol/L (ref 135–145)

## 2021-01-29 LAB — CBC
HCT: 40.9 % (ref 39.0–52.0)
Hemoglobin: 13.3 g/dL (ref 13.0–17.0)
MCH: 25.9 pg — ABNORMAL LOW (ref 26.0–34.0)
MCHC: 32.5 g/dL (ref 30.0–36.0)
MCV: 79.6 fL — ABNORMAL LOW (ref 80.0–100.0)
Platelets: 294 10*3/uL (ref 150–400)
RBC: 5.14 MIL/uL (ref 4.22–5.81)
RDW: 15.9 % — ABNORMAL HIGH (ref 11.5–15.5)
WBC: 8.4 10*3/uL (ref 4.0–10.5)
nRBC: 0 % (ref 0.0–0.2)

## 2021-01-29 MED ORDER — LOSARTAN POTASSIUM-HCTZ 50-12.5 MG PO TABS: 1.0000 | ORAL_TABLET | Freq: Every day | ORAL | 5 refills | Status: DC

## 2021-01-29 NOTE — Patient Instructions (Addendum)
Continue weighing daily and call for an overnight weight gain of > 2 pounds or a weekly weight gain of >5 pounds.   [facility]  [facility] [address], [place], [place] [address] [phone]  Monday June 13th at 1:45pm (Must arrive by 1:15pm for paperwork)    Take amlodipine at bedtime.   Stop hydralazine  Begin losartan/ HCTZ as 1 tablet every morning.

## 2021-01-29 NOTE — Progress Notes (Addendum)
[place] - PHARMACIST COUNSELING NOTE  Guideline-Directed Medical Therapy/Evidence Based Medicine  ACE/ARB/ARNI: N/A Beta Blocker: N/A Aldosterone Antagonist: N/A Diuretic: Furosemide 40 mg daily SGLT2i: N/A  Adherence Assessment  Do you ever forget to take your medication? [] Yes [x] No  Do you ever skip doses due to side effects? [] Yes [x] No  Do you have trouble affording your medicines? [] Yes [x] No  Are you ever unable to pick up your medication due to transportation difficulties? [] Yes [x] No  Do you ever stop taking your medications because you don't believe they are helping? [] Yes [x] No  Do you check your weight daily? [] Yes [x] No   Adherence strategy: Pt endorses adherence as apart of his daily routine  Barriers to obtaining medications: N/A  Vital signs: HR 100, BP 159/96, weight (pounds) 282 ECHO: Date 01/13/2021, EF 60-65%, notes: severe LVH  BMP Latest Ref Rng & Units 01/13/2021 01/12/2021 01/03/2021  Glucose 70 - 99 mg/dL 112(H) 118(H) 110(H)  BUN 6 - 20 mg/dL 27(H) 32(H) 31(H)  Creatinine 0.61 - 1.24 mg/dL 1.66(H) 1.79(H) 1.65(H)  Sodium 135 - 145 mmol/L 140 140 140  Potassium 3.5 - 5.1 mmol/L 3.6 4.0 4.0  Chloride 98 - 111 mmol/L 105 108 109  CO2 22 - 32 mmol/L 24 23 23   Calcium 8.9 - 10.3 mg/dL 9.2 9.0 9.0    Past Medical History:  Diagnosis Date  . Asthma   . CHF (congestive heart failure) ([place])   . Diverticulitis   . Hypertension     ASSESSMENT 45 year old male who presents to the HF clinic as a new patient. Pt BP is elevated today but states that this is the best that it has been. When checking BP at home pt reports 200-220s/100-110s. Pt fiance is with him at the visit and expressed her concern about the high blood pressure and wants to do anything to get it under control. Pt was newly started on a few antihypertensive medications. Pt hydralazine is prescribed every 8 hours, however, pt reports only taking  twice daily. Pt states that he wakes up full of energy and then feels sluggish/fatigue after taking his medications. Pt fiance also states that she notices that the pt seems more out of breath when he talks now.   Recent ED Visit (past 6 months): Date - 01/12/2021, [facility] - pulmonary edema; 01/03/2021 - SOB/new onset CHF  PLAN CHF/HTN  Continue daily BP checks   Discussed with provider changes to pt's current regimen  Continue furosemide 40 mg daily   Change amlodipine 10 mg daily to 10mg  at BEDTIME  Stop hydralazine 25 mg q8h  Start losartan/HCTZ 50-12.5 mg daily  Pt to get labs today and again in ~2 weeks  HLD  01/13/2021 Lipid Panel: LDL 169, TC 253, HDL 51, TG 164  Continue rosuvastatin 20 mg daily  Asthma   Continue albuterol PRN  Time spent: 10 minutes  Sherilyn Banker, PharmD Pharmacy Resident  01/29/2021 1:27 PM    Current Outpatient Medications:  .  albuterol (VENTOLIN HFA) 108 (90 Base) MCG/ACT inhaler, Inhale 2 puffs into the lungs every 4 (four) hours as needed for wheezing or shortness of breath., Disp: 8 g, Rfl: 1 .  amLODipine (NORVASC) 10 MG tablet, Take 1 tablet (10 mg total) by mouth daily., Disp: 30 tablet, Rfl: 1 .  aspirin EC 81 MG EC tablet, Take 1 tablet (81 mg total) by mouth daily. Swallow whole., Disp: 30 tablet, Rfl: 11 .  furosemide (LASIX) 40  MG tablet, Take 1 tablet (40 mg total) by mouth daily., Disp: 30 tablet, Rfl: 11 .  losartan-hydrochlorothiazide (HYZAAR) 50-12.5 MG tablet, Take 1 tablet by mouth daily., Disp: 30 tablet, Rfl: 5 .  nicotine (NICODERM CQ - DOSED IN MG/24 HOURS) 21 mg/24hr patch, Place 1 patch (21 mg total) onto the skin daily. (Patient not taking: Reported on 01/29/2021), Disp: 28 patch, Rfl: 0 .  rosuvastatin (CRESTOR) 20 MG tablet, Take 1 tablet (20 mg total) by mouth daily., Disp: 30 tablet, Rfl: 11   COUNSELING POINTS/CLINICAL PEARLS Losartan (Goal: 50-150 mg daily) Tell patient to avoid activities requiring coordination  until drug effects are realized, as this medicine may cause dizziness.  Advise patient to report lightheadedness or syncope.   Warn male patient to avoid pregnancy and to report a pregnancy that occurs during therapy.  Side effects may include dizziness, upper respiratory infection, nasal congestion, and back pain.  Warn patient to avoid use of potassium supplements or potassium-containing salt substitutes unless they consult healthcare provider. Furosemide  Drug causes sun-sensitivity. Advise patient to use sunscreen and avoid tanning beds. Patient should avoid activities requiring coordination until drug effects are realized, as drug may cause dizziness, vertigo, or blurred vision. This drug may cause excessive urination, hyperglycemia, hyperuricemia, constipation, diarrhea, loss of appetite, nausea, vomiting, purpuric disorder, cramps, spasticity, asthenia, headache, paresthesia, or scaling eczema. Instruct patient to report unusual bleeding/bruising or signs/symptoms of hypotension, infection, pancreatitis, or ototoxicity (tinnitus, hearing impairment). Advise patient to report signs/symptoms of a severe skin reactions (flu-like symptoms, spreading red rash, or skin/mucous membrane blistering) or erythema multiforme. Instruct patient to eat high-potassium foods during drug therapy, as directed by healthcare professional.  Patient should not drink alcohol while taking this drug. Warn patient to avoid use of nonprescription NSAID products without first discussing it with their healthcare provider.   DRUGS TO CAUTION IN HEART FAILURE  Drug or Class Mechanism  Analgesics . NSAIDs . COX-2 inhibitors . Glucocorticoids  Sodium and water retention, increased systemic vascular resistance, decreased response to diuretics   Diabetes Medications . Metformin . Thiazolidinediones o Rosiglitazone (Avandia) o Pioglitazone (Actos) . DPP4 Inhibitors o Saxagliptin (Onglyza) o Sitagliptin  (Januvia)   Lactic acidosis Possible calcium channel blockade   Unknown  Antiarrhythmics . Class I  o Flecainide o Disopyramide . Class III o Sotalol . Other o Dronedarone  Negative inotrope, proarrhythmic   Proarrhythmic, beta blockade  Negative inotrope  Antihypertensives . Alpha Blockers o Doxazosin . Calcium Channel Blockers o Diltiazem o Verapamil o Nifedipine . Central Alpha Adrenergics o Moxonidine . Peripheral Vasodilators o Minoxidil  Increases renin and aldosterone  Negative inotrope    Possible sympathetic withdrawal  Unknown  Anti-infective . Itraconazole . Amphotericin B  Negative inotrope Unknown  Hematologic . Anagrelide . Cilostazol   Possible inhibition of PD IV Inhibition of PD III causing arrhythmias  Neurologic/Psychiatric . Stimulants . Anti-Seizure Drugs o Carbamazepine o Pregabalin . Antidepressants o Tricyclics o Citalopram . Parkinsons o Bromocriptine o Pergolide o Pramipexole . Antipsychotics o Clozapine . Antimigraine o Ergotamine o Methysergide . Appetite suppressants . Bipolar o Lithium  Peripheral alpha and beta agonist activity  Negative inotrope and chronotrope Calcium channel blockade  Negative inotrope, proarrhythmic Dose-dependent QT prolongation  Excessive serotonin activity/valvular damage Excessive serotonin activity/valvular damage Unknown  IgE mediated hypersensitivy, calcium channel blockade  Excessive serotonin activity/valvular damage Excessive serotonin activity/valvular damage Valvular damage  Direct myofibrillar degeneration, adrenergic stimulation  Antimalarials . Chloroquine . Hydroxychloroquine Intracellular inhibition of lysosomal enzymes  Urologic  Agents . Alpha Blockers o Doxazosin o Prazosin o Tamsulosin o Terazosin  Increased renin and aldosterone  Adapted from Page RL, et al. "Drugs That May Cause or Exacerbate Heart Failure: A Scientific Statement from the  [place]." Circulation 2016; 299:M42-A83. DOI: 10.1161/CIR.0000000000000426   MEDICATION ADHERENCES TIPS AND STRATEGIES 1. Taking medication as prescribed improves patient outcomes in heart failure (reduces hospitalizations, improves symptoms, increases survival) 2. Side effects of medications can be managed by decreasing doses, switching agents, stopping drugs, or adding additional therapy. Please let someone in the [facility] know if you have having bothersome side effects so we can modify your regimen. Do not alter your medication regimen without talking to [place].  3. Medication reminders can help patients remember to take drugs on time. If you are missing or forgetting doses you can try linking behaviors, using pill boxes, or an electronic reminder like an alarm on your phone or an app. Some people can also get automated phone calls as medication reminders.

## 2021-02-15 ENCOUNTER — Ambulatory Visit: Payer: Self-pay

## 2021-02-15 ENCOUNTER — Ambulatory Visit: Payer: Self-pay | Attending: Internal Medicine | Admitting: Internal Medicine

## 2021-02-15 ENCOUNTER — Other Ambulatory Visit: Payer: Self-pay

## 2021-02-15 DIAGNOSIS — F1411 Cocaine abuse, in remission: Secondary | ICD-10-CM | POA: Insufficient documentation

## 2021-02-15 DIAGNOSIS — I5032 Chronic diastolic (congestive) heart failure: Secondary | ICD-10-CM

## 2021-02-15 DIAGNOSIS — I5033 Acute on chronic diastolic (congestive) heart failure: Secondary | ICD-10-CM | POA: Insufficient documentation

## 2021-02-15 DIAGNOSIS — I129 Hypertensive chronic kidney disease with stage 1 through stage 4 chronic kidney disease, or unspecified chronic kidney disease: Secondary | ICD-10-CM

## 2021-02-15 DIAGNOSIS — E1169 Type 2 diabetes mellitus with other specified complication: Secondary | ICD-10-CM

## 2021-02-15 DIAGNOSIS — F17200 Nicotine dependence, unspecified, uncomplicated: Secondary | ICD-10-CM

## 2021-02-15 DIAGNOSIS — Z7689 Persons encountering health services in other specified circumstances: Secondary | ICD-10-CM

## 2021-02-15 DIAGNOSIS — F1290 Cannabis use, unspecified, uncomplicated: Secondary | ICD-10-CM

## 2021-02-15 DIAGNOSIS — N1831 Chronic kidney disease, stage 3a: Secondary | ICD-10-CM

## 2021-02-15 MED ORDER — CARVEDILOL 3.125 MG PO TABS
3.1250 mg | ORAL_TABLET | Freq: Two times a day (BID) | ORAL | 3 refills | Status: DC
Start: 2021-02-15 — End: 2021-03-18

## 2021-02-15 MED ORDER — FUROSEMIDE 40 MG PO TABS
ORAL_TABLET | ORAL | 1 refills | Status: DC
Start: 2021-02-15 — End: 2021-02-18

## 2021-02-15 NOTE — Telephone Encounter (Signed)
Patient was given a sooner appt on July 25th at 2:50

## 2021-02-15 NOTE — Progress Notes (Signed)
Patient ID: Brad [name], male   DOB: 1975/11/09, 45 y.o.   MRN: [id number] Virtual Visit via Telephone Note  I connected with Brad [name] on 02/15/2021 at 10:01 a.m by telephone and verified that I am speaking with the correct person using two identifiers  Location: Patient: home Provider: office  Participants: Myself Patient and pt's mother.   I discussed the limitations, risks, security and privacy concerns of performing an evaluation and management service by telephone and the availability of in person appointments. I also discussed with the patient that there may be a patient responsible charge related to this service. The patient expressed understanding and agreed to proceed.   History of Present Illness: Patient with history of HTN, CKD 3, HL, diastolic CHF, tob dep, cocaine use, diverticulosis, DM.  This was supposed to be an in person visit but had to be changed to a telephone visit due to our shortage of nursing staff.  Purpose of today's visit is to establish care and hospital follow-up.  No previous PCP.   Patient was hospitalized 5/21-22/2022 with new onset diastolic CHF.  He presented with shortness of breath x2 weeks.  Echo was consistent with diastolic dysfunction.  Patient was diuresed and responded well.  He was discharged home on hydralazine, amlodipine, aspirin, furosemide, Crestor and nicotine patches. Chest x-ray: Cardiomegaly and pulmonary edema Echo: EF of 60 to 65%, LV and RV normal function.  Severe concentric left ventricular hypertrophy.  Grade 1 diastolic dysfunction.   UDS: Positive for cocaine Cardiac enzymes negative.  BNP 776.  LDL 169.  A1c 6.7.  Creatinine at the time of discharge was 1.76, GFR of 48.  Today:  CHF/HTN:  Since discharge, he has followed up with cardiology NP on the seventh of this month.  Hydralazine was changed to Cozaar/HCTZ. - no CP/SOB/PND/orthopnea  +Swelling in ankles making it difficult for him to stand at work so did not  go in today. Has plate in RT ankle.  Works at [employer]. -reports current wgh of 290 lbs up 20 pounds since hospital discharge. Reports compliance with meds and salt restriction.  His mother manages his medications.  She was able to do medication reconciliation with me over the phone today.  She confirms that he is taking furosemide, Cozaar/HCTZ, amlodipine, Crestor and aspirin. -checks BP daily.  Yesterday BP was 177/109.  Other readings 154/104, 170/103.    CKD 3: Upon review of his chart, it seems that patient has CKD stage III.  He reports that no one has ever told him that his kidney was not functioning well.  GFR over the past year has ranged from 43-52.  DM: Patient has had 2 A1c's done in the past 10 months.  Both of which are in range for diabetes.  Most recent 1 was 6.7.  Patient states he was not aware of this diagnosis either.  Tob dep:  still smoking 1 pk/Q2-3 days.  Wants to quit.  Patches prescribed for him but he has not picked up as yet.    Substance use disorder: He tells me that he has quit cocaine completely since hospital discharge.  Still smokes marijuana.   Outpatient Encounter Medications as of 02/15/2021  Medication Sig   albuterol (VENTOLIN HFA) 108 (90 Base) MCG/ACT inhaler Inhale 2 puffs into the lungs every 4 (four) hours as needed for wheezing or shortness of breath.   amLODipine (NORVASC) 10 MG tablet Take 1 tablet (10 mg total) by mouth daily.   aspirin EC 81 MG  EC tablet Take 1 tablet (81 mg total) by mouth daily. Swallow whole.   furosemide (LASIX) 40 MG tablet Take 1 tablet (40 mg total) by mouth daily.   losartan-hydrochlorothiazide (HYZAAR) 50-12.5 MG tablet Take 1 tablet by mouth daily.   nicotine (NICODERM CQ - DOSED IN MG/24 HOURS) 21 mg/24hr patch Place 1 patch (21 mg total) onto the skin daily. (Patient not taking: Reported on 01/29/2021)   rosuvastatin (CRESTOR) 20 MG tablet Take 1 tablet (20 mg total) by mouth daily.   No facility-administered  encounter medications on file as of 02/15/2021.      Observations/Objective:   Chemistry      Component Value Date/Time   NA 139 01/29/2021 1350   K 3.8 01/29/2021 1350   CL 106 01/29/2021 1350   CO2 26 01/29/2021 1350   BUN 29 (H) 01/29/2021 1350   CREATININE 1.76 (H) 01/29/2021 1350      Component Value Date/Time   CALCIUM 9.4 01/29/2021 1350   ALKPHOS 62 01/12/2021 0629   AST 21 01/12/2021 0629   ALT 17 01/12/2021 0629   BILITOT 0.5 01/12/2021 0629     Lab Results  Component Value Date   CHOL 253 (H) 01/13/2021   HDL 51 01/13/2021   LDLCALC 169 (H) 01/13/2021   TRIG 164 (H) 01/13/2021   CHOLHDL 5.0 01/13/2021   Lab Results  Component Value Date   WBC 8.4 01/29/2021   HGB 13.3 01/29/2021   HCT 40.9 01/29/2021   MCV 79.6 (L) 01/29/2021   PLT 294 01/29/2021   Lab Results  Component Value Date   HGBA1C 6.7 (H) 01/13/2021     Assessment and Plan: 1. Encounter to establish care   2. Chronic diastolic congestive heart failure (HCC) Discussed the importance of compliance with medications and low-salt diet.  He has had 20 pound weight gain since hospital discharge and is reporting some swelling in the ankles.  Swelling could be due to fluid building up again or as a side effect of Norvasc. -I recommend increasing furosemide to 40 mg twice a day for 3 days then going back to 40 mg once a day. - furosemide (LASIX) 40 MG tablet; 1 tab PO BID x 3 days then 1 tab daily thereafter.  Dispense: 33 tablet; Refill: 1 - carvedilol (COREG) 3.125 MG tablet; Take 1 tablet (3.125 mg total) by mouth 2 (two) times daily with a meal.  Dispense: 60 tablet; Refill: 3  3. Hypertensive kidney disease with stage 3a chronic kidney disease (HCC) Home blood pressure readings not at goal. Recommend adding carvedilol twice a day.  Continue other blood pressure medications.  Encouraged him to continue blood pressure checks.  Stressed the importance of good blood pressure control to help prevent  further decline in kidney function.  Avoid NSAIDs. -He has an appointment with the cardiologist on Monday so blood pressure can be rechecked at that time. - Basic Metabolic Panel; Future  4. Type 2 diabetes mellitus with morbid obesity (HCC) Discussed the importance of healthy eating habits.  Advised to eliminate sugary drinks from the diet, cut back on portion sizes of white carbohydrates, eat more lean white meat instead of red meat and incorporate fresh fruits and vegetables into the diet daily. -I would like to put him on Farxiga but I will wait to see whether his kidney function has stabilized on the HCTZ and furosemide.  If stable we will add the [place] which does provide some renal protective effects. - Microalbumin / creatinine urine ratio;  Future - Basic Metabolic Panel; Future  5. Tobacco dependence Pt is current smoker. Patient advised to quit smoking. Discussed health risks associated with smoking including lung and other types of cancers, chronic lung diseases and CV risks.. Pt ready to give trail of quitting.   Discussed methods to help quit including quitting cold [place], use of NRT, Chantix and Bupropion.  Pt wanting to try: patches.  He plans to pick up the prescription that is already at his pharmacy for the 21 mg patches.  I went over over with him how to use the patches. _3_ Minutes spent on counseling. F/U: Reassess progress on in person follow-up visit   6. 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  Encouraged him to remain free of street drug use.  He declines referral to an outpatient treatment program.  Advised patient that the new blood pressure medication that I added called carvedilol would interact adversely with cocaine.  Patient states this would not be a problem because he has discontinued using.  7. Marijuana user   Follow Up Instructions: F/u in 4 wks   I discussed the assessment and treatment plan with the patient. The patient was  provided an opportunity to ask questions and all were answered. The patient agreed with the plan and demonstrated an understanding of the instructions.   The patient was advised to call back or seek an in-person evaluation if the symptoms worsen or if the condition fails to improve as anticipated.  I  Spent 30 minutes on this telephone encounter  Jonah Blue, MD

## 2021-02-15 NOTE — Telephone Encounter (Signed)
Patient called and he gave his sister Olivia Mackie permission to speak to me. She says she called to go over his medications so she can have them correct in his pill box. I advised the medications on his profile and how he is supposed to take them, she verbalized understanding. Advised it's noted to come to the office in 4 weeks for a follow up. She says the appointments will have to be on Monday's because he's off that day. First available is on Monday, 04/22/21 at 1110, patient verbalized understanding and accepted the appointment.   Reason for Disposition . General information question, no triage required and triager able to answer question  Answer Assessment - Initial Assessment Questions 1. REASON FOR CALL or QUESTION: "What is your reason for calling today?" or "How can I best help you?" or "What question do you have that I can help answer?"     I would like to go over his medications  Protocols used: Information Only Call - No Triage-A-AH

## 2021-02-16 NOTE — Progress Notes (Signed)
Patient ID: Brad [name], male    DOB: [birth year], 45 y.o.   MRN: [id number]  HPI  Brad [name] is a 45 y/o male with a history of HTN, tobacco use, substance abuse and chronic heart failure.   Echo report from 01/13/21 reviewed and showed an EF of 60-65% along with severe LVH.   Admitted 01/12/21 due to shortness of breath. Cardiology consult obtained. UDS + for cocaine. Initially given IV lasix with transition to oral diuretics. Discharged the following day. Was in the ED 01/03/21 due to shortness where he was treated and released.   He presents today for a follow-up visit with a chief complaint of moderate fatigue with little exertion. He describes this as chronic in nature having been present for several years. He has associated chest tightness, apneic episodes, difficulty sleeping and fluctuating weight along with this. He denies any abdominal distention, palpitations, chest pain, shortness of breath, cough or dizziness.   He stopped his amlodipine because he thought it was contributing to his edema in his legs. Reports edema much improved since stopping amlodipine.   [facility] stop [facility] after last visit but has to finish his paperwork from them. Requests the medications be sent [facility] to make it easier for him to pick them up.   BP log at home shows elevated BP. Needs work note stating that he can return to work.   Past Medical History:  Diagnosis Date   Asthma    CHF (congestive heart failure) (HCC)    Diverticulitis    Hypertension    Past Surgical History:  Procedure Laterality Date   ANKLE SURGERY     Family History  Problem Relation Age of Onset   Hypertension Mother    Social History   Tobacco Use   Smoking status: Every Day    Packs/day: 0.50    Pack years: 0.00    Types: Cigarettes   Smokeless tobacco: Never  Substance Use Topics   Alcohol use: Yes    Comment: occas   No Known Allergies  Prior to Admission medications   Medication  Sig Start Date End Date Taking? Authorizing Provider  albuterol (VENTOLIN HFA) 108 (90 Base) MCG/ACT inhaler Inhale 2 puffs into the lungs every 4 (four) hours as needed for wheezing or shortness of breath. 01/13/21  Yes Arnetha Courser, MD  aspirin EC 81 MG EC tablet Take 1 tablet (81 mg total) by mouth daily. Swallow whole. 01/14/21  Yes Arnetha Courser, MD  carvedilol (COREG) 3.125 MG tablet Take 1 tablet (3.125 mg total) by mouth 2 (two) times daily with a meal. 02/15/21  Yes Marcine Matar, MD  furosemide (LASIX) 40 MG tablet Take 40 mg by mouth daily.   Yes [provider]  losartan-hydrochlorothiazide (HYZAAR) 100-25 MG tablet Take 1 tablet by mouth daily. 02/18/21  Yes Kamdyn Colborn A, FNP  nicotine (NICODERM CQ - DOSED IN MG/24 HOURS) 21 mg/24hr patch Place 1 patch (21 mg total) onto the skin daily. Patient not taking: Reported on 01/29/2021 01/14/21   Arnetha Courser, MD  rosuvastatin (CRESTOR) 20 MG tablet Take 1 tablet (20 mg total) by mouth daily. 02/18/21 02/18/22  Delma Freeze, FNP    Review of Systems  Constitutional:  Positive for fatigue (easily). Negative for appetite change.  HENT:  Negative for congestion, postnasal drip and sore throat.   Eyes: Negative.   Respiratory:  Positive for apnea and chest tightness. Negative for cough and shortness of breath.        +  snoring  Cardiovascular:  Positive for leg swelling (improved). Negative for chest pain and palpitations.  Gastrointestinal:  Negative for abdominal distention and abdominal pain.  Endocrine: Negative.   Genitourinary: Negative.   Musculoskeletal:  Negative for back pain and neck pain.  Skin: Negative.   Allergic/Immunologic: Negative.   Neurological:  Negative for dizziness and light-headedness.  Hematological:  Negative for adenopathy. Does not bruise/bleed easily.  Psychiatric/Behavioral:  Positive for sleep disturbance (sleeping on 1 pillow although elevated). Negative for dysphoric mood. The patient is not  nervous/anxious.    Vitals:   02/18/21 1413  BP: (!) 145/95  Pulse: 75  Resp: 20  SpO2: 96%  Weight: 295 lb 8 oz (134 kg)  Height: 5\' 6"  (1.676 m)   Wt Readings from Last 3 Encounters:  02/18/21 295 lb 8 oz (134 kg)  01/29/21 282 lb (127.9 kg)  01/12/21 270 lb (122.5 kg)   Lab Results  Component Value Date   CREATININE 1.79 (H) 02/18/2021   CREATININE 1.76 (H) 01/29/2021   CREATININE 1.66 (H) 01/13/2021   Physical Exam Vitals and nursing note reviewed. Exam conducted with a chaperone present (fiance').  Constitutional:      Appearance: Normal appearance.  HENT:     Head: Normocephalic and atraumatic.  Cardiovascular:     Rate and Rhythm: Normal rate and regular rhythm.  Pulmonary:     Effort: Pulmonary effort is normal. No respiratory distress.     Breath sounds: No wheezing or rales.  Abdominal:     General: There is no distension.     Palpations: Abdomen is soft.     Tenderness: There is no abdominal tenderness.  Musculoskeletal:        General: No tenderness.     Cervical back: Normal range of motion and neck supple.     Right lower leg: No edema.     Left lower leg: Edema (trace pitting) present.  Skin:    General: Skin is warm and dry.  Neurological:     General: No focal deficit present.     Mental Status: He is alert and oriented to person, place, and time.  Psychiatric:        Mood and Affect: Mood normal.        Behavior: Behavior normal.        Thought Content: Thought content normal.    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)- - NYHA class III - euvolemic today - weighing daily and says that his weight has been fluctuating; instructed to call for an overnight weight gain of > 2 pounds or a weekly weight gain of > 5 pounds - weight up 13 pounds from last visit here 3 weeks ago - does not add salt to his food but does eat at 01/15/2021 while at work; reviewed the importance of  limiting eating at fast food places and reading food labels for sodium content - did not show for cardiology appt with Dr. [employer] on 02/04/21 - will increase his losartan/HCTZ to 100/25mg  daily; advised him that he could finish his current dose by taking 2 tablets daily; once he gets his increased dose, he will resume taking it as 1 tablet daily - check BMP next visit - discussed changing this to entresto at next visit; will need [employer] patient assistance - he's unsure of his fluid intake; explained that he needed to drink ~ 60 ounces of fluid daily - BNP 01/12/21 was 776.9 -  has paperwork from [facility] that he has to return - paperwork given to him on [facility] [employer]  2: HTN with CKD- - BP looks good today - had telemedicine visit with PCP Laural Benes) 02/15/21 - BMP 01/29/21 reviewed and showed sodium 139, potassium 3.8, creatinine 1.76 and GFR 48 - check BMP today due to starting losartan/HCTZ at last visit  3: Tobacco use- - no longer smoking and is wearing nicotine patch - drinks alcohol on social occasions only (birthday/ holiday) - uses marijuana - denies any recent cocaine; last use was last month  4: Snoring- - fiance' notes patient snores as well as has apneic episodes - patient says that he wakes himself up which then scares him and he's finding it stressful to now go to sleep because of this fear - most likely needs sleep study but will defer to PCP due to patient currently being uninsured and will need financial assistance   Medication bottles reviewed.   Return in 6 weeks or sooner for any questions/problems before then.

## 2021-02-18 ENCOUNTER — Other Ambulatory Visit: Payer: Self-pay

## 2021-02-18 ENCOUNTER — Encounter: Payer: Self-pay | Admitting: Family

## 2021-02-18 ENCOUNTER — Ambulatory Visit: Payer: Self-pay | Attending: Family | Admitting: Family

## 2021-02-18 VITALS — BP 145/95 | HR 75 | Resp 20 | Ht 66.0 in | Wt 295.5 lb

## 2021-02-18 DIAGNOSIS — Z79899 Other long term (current) drug therapy: Secondary | ICD-10-CM | POA: Insufficient documentation

## 2021-02-18 DIAGNOSIS — Z8249 Family history of ischemic heart disease and other diseases of the circulatory system: Secondary | ICD-10-CM | POA: Insufficient documentation

## 2021-02-18 DIAGNOSIS — Z720 Tobacco use: Secondary | ICD-10-CM

## 2021-02-18 DIAGNOSIS — Z7901 Long term (current) use of anticoagulants: Secondary | ICD-10-CM | POA: Insufficient documentation

## 2021-02-18 DIAGNOSIS — I130 Hypertensive heart and chronic kidney disease with heart failure and stage 1 through stage 4 chronic kidney disease, or unspecified chronic kidney disease: Secondary | ICD-10-CM | POA: Insufficient documentation

## 2021-02-18 DIAGNOSIS — R0683 Snoring: Secondary | ICD-10-CM

## 2021-02-18 DIAGNOSIS — R0789 Other chest pain: Secondary | ICD-10-CM | POA: Insufficient documentation

## 2021-02-18 DIAGNOSIS — R5383 Other fatigue: Secondary | ICD-10-CM | POA: Insufficient documentation

## 2021-02-18 DIAGNOSIS — F17210 Nicotine dependence, cigarettes, uncomplicated: Secondary | ICD-10-CM | POA: Insufficient documentation

## 2021-02-18 DIAGNOSIS — I10 Essential (primary) hypertension: Secondary | ICD-10-CM

## 2021-02-18 DIAGNOSIS — N189 Chronic kidney disease, unspecified: Secondary | ICD-10-CM | POA: Insufficient documentation

## 2021-02-18 DIAGNOSIS — Z7982 Long term (current) use of aspirin: Secondary | ICD-10-CM | POA: Insufficient documentation

## 2021-02-18 DIAGNOSIS — I5032 Chronic diastolic (congestive) heart failure: Secondary | ICD-10-CM

## 2021-02-18 LAB — BASIC METABOLIC PANEL
Anion gap: 9 (ref 5–15)
BUN: 34 mg/dL — ABNORMAL HIGH (ref 6–20)
CO2: 27 mmol/L (ref 22–32)
Calcium: 9.3 mg/dL (ref 8.9–10.3)
Chloride: 104 mmol/L (ref 98–111)
Creatinine, Ser: 1.79 mg/dL — ABNORMAL HIGH (ref 0.61–1.24)
GFR, Estimated: 47 mL/min — ABNORMAL LOW (ref >60)
Glucose, Bld: 117 mg/dL — ABNORMAL HIGH (ref 70–99)
Potassium: 3.7 mmol/L (ref 3.5–5.1)
Sodium: 140 mmol/L (ref 135–145)

## 2021-02-18 MED ORDER — LOSARTAN POTASSIUM-HCTZ 100-25 MG PO TABS: 1.0000 | ORAL_TABLET | Freq: Every day | ORAL | 5 refills | Status: DC

## 2021-02-18 MED ORDER — ROSUVASTATIN CALCIUM 20 MG PO TABS
20.0000 mg | ORAL_TABLET | Freq: Every day | ORAL | 11 refills | Status: DC
  Filled 2021-04-04: qty 30, 30d supply, fill #0
  Filled 2021-05-08: qty 30, 30d supply, fill #1

## 2021-02-18 NOTE — Patient Instructions (Addendum)
Continue weighing daily and call for an overnight weight gain of > 2 pounds or a weekly weight gain of >5 pounds.    Drink about 60 ounces of fluid every day   Finish your current losartan/ HCTZ bottle by taking 2 tablets every morning until gone. When you pick up the prescription of 100mg / 25mg  dose you will resume taking 1 tablet daily.

## 2021-02-26 NOTE — Telephone Encounter (Signed)
Unable to reach number not in service at this time.

## 2021-03-18 ENCOUNTER — Ambulatory Visit: Payer: Self-pay | Attending: Internal Medicine | Admitting: Internal Medicine

## 2021-03-18 ENCOUNTER — Other Ambulatory Visit: Payer: Self-pay

## 2021-03-18 ENCOUNTER — Encounter: Payer: Self-pay | Admitting: Internal Medicine

## 2021-03-18 VITALS — BP 149/107 | HR 81 | Wt 303.0 lb

## 2021-03-18 DIAGNOSIS — I129 Hypertensive chronic kidney disease with stage 1 through stage 4 chronic kidney disease, or unspecified chronic kidney disease: Secondary | ICD-10-CM

## 2021-03-18 DIAGNOSIS — R0683 Snoring: Secondary | ICD-10-CM

## 2021-03-18 DIAGNOSIS — I5032 Chronic diastolic (congestive) heart failure: Secondary | ICD-10-CM

## 2021-03-18 DIAGNOSIS — N1831 Chronic kidney disease, stage 3a: Secondary | ICD-10-CM

## 2021-03-18 MED ORDER — CARVEDILOL 6.25 MG PO TABS
6.2500 mg | ORAL_TABLET | Freq: Two times a day (BID) | ORAL | 5 refills | Status: DC
  Filled 2021-04-04: qty 60, 30d supply, fill #0
  Filled 2021-05-08: qty 60, 30d supply, fill #1

## 2021-03-18 NOTE — Progress Notes (Signed)
Virtual Visit via Telephone Note  I connected with Brad [name] on 03/18/2021 at 3:37 p.m by telephone and verified that I am speaking with the correct person using two identifiers  Location: Patient: home Provider: office  Participants: Myself Patient and fianc    I discussed the limitations, risks, security and privacy concerns of performing an evaluation and management service by telephone and the availability of in person appointments. I also discussed with the patient that there may be a patient responsible charge related to this service. The patient expressed understanding and agreed to proceed.   History of Present Illness: Patient with history of HTN, CKD 3, HL, diastolic CHF (EF 60 to 65% 12/2020), tob dep, cocaine use (quit since hosp 12/2020), diverticulosis, DM.  Last eval 02/15/21 as hosp f/u.  dCHF/HTN/CKD 3: seen at CHF clinic in [place] several days after last visit with me. Pt d/c Norvasc as he felt it was contributing to LE edema. checks BP 5x/wk.  BP this a.m was 149/107. Few other readings for past several days: 150-160/low 100s.  Pulse today 81.  Wgh 303 lbs which is up 7 pounds from a month ago. Reports compliance with Losartan/HCTZ, Coreg and Furosemide He limits salt in diet No LE edema at this time.  Endorses SOB when laying down.  Sleeps on 1 pillow. Pt endorses loud snorning and fianc states he has moments where he stops breathing.  He endorses unrefreshed sleep.   -In regards to his kidneys, his creatinine has stabilized around 1.7 and GFR in the upper 40s. He has not applied for OC as yet.   Outpatient Encounter Medications as of 03/18/2021  Medication Sig   albuterol (VENTOLIN HFA) 108 (90 Base) MCG/ACT inhaler Inhale 2 puffs into the lungs every 4 (four) hours as needed for wheezing or shortness of breath.   aspirin EC 81 MG EC tablet Take 1 tablet (81 mg total) by mouth daily. Swallow whole.   carvedilol (COREG) 3.125 MG tablet Take 1 tablet (3.125  mg total) by mouth 2 (two) times daily with a meal.   furosemide (LASIX) 40 MG tablet Take 40 mg by mouth daily.   losartan-hydrochlorothiazide (HYZAAR) 100-25 MG tablet Take 1 tablet by mouth daily.   nicotine (NICODERM CQ - DOSED IN MG/24 HOURS) 21 mg/24hr patch Place 1 patch (21 mg total) onto the skin daily.   rosuvastatin (CRESTOR) 20 MG tablet Take 1 tablet (20 mg total) by mouth daily.   No facility-administered encounter medications on file as of 03/18/2021.     Observations/Objective:   Chemistry      Component Value Date/Time   NA 140 02/18/2021 1454   K 3.7 02/18/2021 1454   CL 104 02/18/2021 1454   CO2 27 02/18/2021 1454   BUN 34 (H) 02/18/2021 1454   CREATININE 1.79 (H) 02/18/2021 1454      Component Value Date/Time   CALCIUM 9.3 02/18/2021 1454   ALKPHOS 62 01/12/2021 0629   AST 21 01/12/2021 0629   ALT 17 01/12/2021 0629   BILITOT 0.5 01/12/2021 0629       Assessment and Plan: 1. Hypertensive kidney disease with stage 3a chronic kidney disease (HCC) Not at goal.  Increase carvedilol to 6.25 mg twice a day.  Follow-up with clinical pharmacist in 1 week for repeat blood pressure check. - carvedilol (COREG) 6.25 MG tablet; Take 1 tablet (6.25 mg total) by mouth 2 (two) times daily with a meal.  Dispense: 60 tablet; Refill: 5  2. Chronic diastolic congestive  heart failure (HCC) Advised to limit salt as much as possible. Continue furosemide and current blood pressure medications.  3. Snoring Advised to apply for the [employer] card/[employer] discount card.  Once approved he should let me know so that I can submit referral for sleep study as he most likely has sleep apnea.  Advised that sleep apnea can adversely affect blood pressure and can also play a role in decompensated CHF   Follow Up Instructions: 2 mths F/u with clinical pharmacist in 1 week for blood pressure check.   I discussed the assessment and treatment plan with the patient. The patient was provided an  opportunity to ask questions and all were answered. The patient agreed with the plan and demonstrated an understanding of the instructions.   The patient was advised to call back or seek an in-person evaluation if the symptoms worsen or if the condition fails to improve as anticipated.  I  Spent 21 minutes on this telephone encounter  Jonah Blue, MD

## 2021-03-29 ENCOUNTER — Ambulatory Visit: Payer: Self-pay | Admitting: Pharmacist

## 2021-04-04 ENCOUNTER — Other Ambulatory Visit: Payer: Self-pay

## 2021-04-04 MED ORDER — HYDROCHLOROTHIAZIDE 25 MG PO TABS
25.0000 mg | ORAL_TABLET | Freq: Every day | ORAL | 3 refills | Status: DC
  Filled 2021-04-04: qty 30, 30d supply, fill #0

## 2021-04-04 MED ORDER — LOSARTAN POTASSIUM 100 MG PO TABS
100.0000 mg | ORAL_TABLET | Freq: Every day | ORAL | 3 refills | Status: DC
  Filled 2021-04-04: qty 15, 15d supply, fill #0

## 2021-04-04 MED ORDER — FUROSEMIDE 40 MG PO TABS
40.0000 mg | ORAL_TABLET | Freq: Every day | ORAL | 0 refills | Status: DC
  Filled 2021-04-04: qty 33, 33d supply, fill #0

## 2021-04-05 NOTE — Progress Notes (Signed)
Patient ID: Brad [name], male    DOB: 1975-12-05, 45 y.o.   MRN: [id number]  HPI  Brad [name] is a 45 y/o male with a history of HTN, tobacco use, substance abuse and chronic heart failure.   Echo report from 01/13/21 reviewed and showed an EF of 60-65% along with severe LVH.   Admitted 01/12/21 due to shortness of breath. Cardiology consult obtained. UDS + for cocaine. Initially given IV lasix with transition to oral diuretics. Discharged the following day. Was in the ED 01/03/21 due to shortness where he was treated and released.   He presents today for a follow-up visit with a chief complaint of moderate shortness of breath upon minimal exertion. He describes this as chronic in nature having been present for several years although does feel like it has worsened over the last several week. He has associated fatigue, apnea, right shoulder pain, difficulty sleeping with PND and weight gain along with this. He denies any dizziness, abdominal distention, palpitations, pedal edema, chest pain or cough. Says that when he lays down at night, he becomes really short of breath and then sits up the recliner and can breath better.   Says that he's now getting his medications through [facility].   Past Medical History:  Diagnosis Date   Asthma    CHF (congestive heart failure) (HCC)    Diverticulitis    Hypertension    Past Surgical History:  Procedure Laterality Date   ANKLE SURGERY     Family History  Problem Relation Age of Onset   Hypertension Mother    Social History   Tobacco Use   Smoking status: Every Day    Packs/day: 0.50    Types: Cigarettes   Smokeless tobacco: Never  Substance Use Topics   Alcohol use: Yes    Comment: occas   No Known Allergies  Prior to Admission medications   Medication Sig Start Date End Date Taking? Authorizing Provider  albuterol (VENTOLIN HFA) 108 (90 Base) MCG/ACT inhaler Inhale 2 puffs into the lungs every 4 (four) hours as  needed for wheezing or shortness of breath. 01/13/21  Yes Arnetha Courser, MD  aspirin EC 81 MG EC tablet Take 1 tablet (81 mg total) by mouth daily. Swallow whole. 01/14/21  Yes Arnetha Courser, MD  carvedilol (COREG) 6.25 MG tablet Take 1 tablet (6.25 mg total) by mouth 2 (two) times daily with a meal. 03/18/21  Yes Marcine Matar, MD  furosemide (LASIX) 40 MG tablet Take 1 tablet (40 mg total) by mouth once daily. 02/15/21  Yes Marcine Matar, MD  losartan-hydrochlorothiazide (HYZAAR) 100-25 MG tablet Take 1 tablet by mouth daily. 02/18/21  Yes Clarisa Kindred A, FNP  rosuvastatin (CRESTOR) 20 MG tablet Take 1 tablet (20 mg total) by mouth once daily. 02/18/21 02/18/22 Yes Dyon Rotert A, FNP  nicotine (NICODERM CQ - DOSED IN MG/24 HOURS) 21 mg/24hr patch Place 1 patch (21 mg total) onto the skin daily. 04/08/21   Delma Freeze, FNP    Review of Systems  Constitutional:  Positive for fatigue (easily). Negative for appetite change.  HENT:  Negative for congestion, postnasal drip and sore throat.   Eyes: Negative.   Respiratory:  Positive for apnea and shortness of breath (worse when laying down). Negative for cough and chest tightness.        + snoring  Cardiovascular:  Negative for chest pain, palpitations and leg swelling.  Gastrointestinal:  Negative for abdominal distention and abdominal pain.  Endocrine: Negative.   Genitourinary: Negative.   Musculoskeletal:  Positive for arthralgias (right shoulder). Negative for back pain and neck pain.  Skin: Negative.   Allergic/Immunologic: Negative.   Neurological:  Negative for dizziness and light-headedness.  Hematological:  Negative for adenopathy. Does not bruise/bleed easily.  Psychiatric/Behavioral:  Positive for sleep disturbance (sleeping on 1 pillow ; + PND). Negative for dysphoric mood. The patient is not nervous/anxious.    Vitals:   04/08/21 1410  BP: (!) 185/110  Pulse: 71  Resp: 20  SpO2: 99%  Weight: (!) 307 lb 8 oz (139.5  kg)  Height: 5\' 6"  (1.676 m)   Wt Readings from Last 3 Encounters:  04/08/21 (!) 307 lb 8 oz (139.5 kg)  03/18/21 (!) 303 lb (137.4 kg)  02/18/21 295 lb 8 oz (134 kg)   Lab Results  Component Value Date   CREATININE 1.79 (H) 02/18/2021   CREATININE 1.76 (H) 01/29/2021   CREATININE 1.66 (H) 01/13/2021     Physical Exam Vitals and nursing note reviewed.  Constitutional:      Appearance: Normal appearance.  HENT:     Head: Normocephalic and atraumatic.  Cardiovascular:     Rate and Rhythm: Normal rate and regular rhythm.  Pulmonary:     Effort: Pulmonary effort is normal. No respiratory distress.     Breath sounds: No wheezing or rales.  Abdominal:     General: There is no distension.     Palpations: Abdomen is soft.     Tenderness: There is no abdominal tenderness.  Musculoskeletal:        General: No tenderness.     Cervical back: Normal range of motion and neck supple.     Right lower leg: No edema.     Left lower leg: No edema.  Skin:    General: Skin is warm and dry.  Neurological:     General: No focal deficit present.     Mental Status: He is alert and oriented to person, place, and time.  Psychiatric:        Mood and Affect: Mood normal.        Behavior: Behavior normal.        Thought Content: Thought content normal.    Assessment & Plan:  1: Acute on Chronic heart failure with preserved ejection fraction with structural changes (LVH)- - NYHA class III - euvolemic today - weighing daily and says that his weight has been increasing; reminded to call for an overnight weight gain of > 2 pounds or a weekly weight gain of > 5 pounds - weight up 12 pounds from last visit here 6 weeks ago - due to weight gain and worsening SOB along with PND, will send for 80mg  IV lasix/ 01/15/2021 PO potassium - check BNP/BMP as well today - does not add salt to his food but does eat at [employer] while at work; reviewed the importance of limiting eating at fast food places and  reading food labels for sodium content - did not show for cardiology appt with Dr. on 02/04/21 - will stop losartan/ HCTZ and begin entresto 49/51mg   as 1 tablet BID to start tomorrow. 1 month samples provided and RX sent [facility] - he's unsure of his fluid intake; explained that he needed to drink ~ 60 ounces of fluid daily - BNP 01/12/21 was 776.9  2: HTN with CKD- - BP elevated today; changing losartan/ HCTZ to entresto 49/51 per above - had telemedicine visit with PCP (  Johnson) 03/18/21 - BMP 02/18/21 reviewed and showed sodium 140, potassium 3.7, creatinine 1.79 and GFR 47  3: Tobacco use- - no longer smoking and is wearing nicotine patch - drinks alcohol on social occasions only (birthday/ holiday) - uses marijuana - denies any recent cocaine  4: Snoring- - patient continues to endorse snoring - patient says that he wakes himself up which then scares him and he's finding it stressful to now go to sleep because of this fear - most likely needs sleep study but will defer to PCP due to patient currently being uninsured and will need financial assistance   Medication bottles reviewed.   Return in 3 days or sooner for any questions/problems before then.

## 2021-04-08 ENCOUNTER — Other Ambulatory Visit: Payer: Self-pay

## 2021-04-08 ENCOUNTER — Other Ambulatory Visit: Payer: Self-pay | Admitting: Family

## 2021-04-08 ENCOUNTER — Ambulatory Visit: Payer: Self-pay | Attending: Family | Admitting: Family

## 2021-04-08 ENCOUNTER — Encounter: Payer: Self-pay | Admitting: Family

## 2021-04-08 ENCOUNTER — Ambulatory Visit: Payer: Self-pay | Admitting: Pharmacy Technician

## 2021-04-08 ENCOUNTER — Ambulatory Visit
Admission: RE | Admit: 2021-04-08 | Discharge: 2021-04-08 | Disposition: A | Discharge status: Home / Self Care | Payer: Self-pay | Source: Ambulatory Visit | Attending: Family | Admitting: Family

## 2021-04-08 VITALS — BP 185/110 | HR 71 | Resp 20 | Ht 66.0 in | Wt 307.5 lb

## 2021-04-08 DIAGNOSIS — Z87891 Personal history of nicotine dependence: Secondary | ICD-10-CM | POA: Insufficient documentation

## 2021-04-08 DIAGNOSIS — F1290 Cannabis use, unspecified, uncomplicated: Secondary | ICD-10-CM | POA: Insufficient documentation

## 2021-04-08 DIAGNOSIS — I5032 Chronic diastolic (congestive) heart failure: Secondary | ICD-10-CM | POA: Insufficient documentation

## 2021-04-08 DIAGNOSIS — I10 Essential (primary) hypertension: Secondary | ICD-10-CM

## 2021-04-08 DIAGNOSIS — Z7982 Long term (current) use of aspirin: Secondary | ICD-10-CM | POA: Insufficient documentation

## 2021-04-08 DIAGNOSIS — N189 Chronic kidney disease, unspecified: Secondary | ICD-10-CM | POA: Insufficient documentation

## 2021-04-08 DIAGNOSIS — R0683 Snoring: Secondary | ICD-10-CM | POA: Insufficient documentation

## 2021-04-08 DIAGNOSIS — Z720 Tobacco use: Secondary | ICD-10-CM

## 2021-04-08 DIAGNOSIS — Z8249 Family history of ischemic heart disease and other diseases of the circulatory system: Secondary | ICD-10-CM | POA: Insufficient documentation

## 2021-04-08 DIAGNOSIS — I130 Hypertensive heart and chronic kidney disease with heart failure and stage 1 through stage 4 chronic kidney disease, or unspecified chronic kidney disease: Secondary | ICD-10-CM | POA: Insufficient documentation

## 2021-04-08 DIAGNOSIS — Z79899 Other long term (current) drug therapy: Secondary | ICD-10-CM

## 2021-04-08 DIAGNOSIS — I5033 Acute on chronic diastolic (congestive) heart failure: Secondary | ICD-10-CM | POA: Insufficient documentation

## 2021-04-08 DIAGNOSIS — J45909 Unspecified asthma, uncomplicated: Secondary | ICD-10-CM | POA: Insufficient documentation

## 2021-04-08 LAB — BASIC METABOLIC PANEL
Anion gap: 8 (ref 5–15)
BUN: 33 mg/dL — ABNORMAL HIGH (ref 6–20)
CO2: 30 mmol/L (ref 22–32)
Calcium: 9.1 mg/dL (ref 8.9–10.3)
Chloride: 101 mmol/L (ref 98–111)
Creatinine, Ser: 1.99 mg/dL — ABNORMAL HIGH (ref 0.61–1.24)
GFR, Estimated: 41 mL/min — ABNORMAL LOW (ref >60)
Glucose, Bld: 119 mg/dL — ABNORMAL HIGH (ref 70–99)
Potassium: 4.2 mmol/L (ref 3.5–5.1)
Sodium: 139 mmol/L (ref 135–145)

## 2021-04-08 LAB — BRAIN NATRIURETIC PEPTIDE: B Natriuretic Peptide: 159.9 pg/mL — ABNORMAL HIGH (ref 0.0–100.0)

## 2021-04-08 MED ORDER — NICOTINE 21 MG/24HR TD PT24
21.0000 mg | MEDICATED_PATCH | Freq: Every day | TRANSDERMAL | 3 refills | Status: DC
  Filled 2021-04-08: qty 90, 90d supply, fill #0

## 2021-04-08 MED ORDER — SACUBITRIL-VALSARTAN 49-51 MG PO TABS
1.0000 | ORAL_TABLET | Freq: Two times a day (BID) | ORAL | 3 refills | Status: DC
  Filled 2021-04-08: qty 180, 90d supply, fill #0
  Filled 2021-05-08: qty 60, 30d supply, fill #0

## 2021-04-08 MED ORDER — FUROSEMIDE 10 MG/ML IJ SOLN
80.0000 mg | Freq: Once | INTRAMUSCULAR | Status: AC
Start: 2021-04-08 — End: 2021-04-08
  Administered 2021-04-08: 80 mg via INTRAVENOUS

## 2021-04-08 MED ORDER — POTASSIUM CHLORIDE CRYS ER 20 MEQ PO TBCR
40.0000 meq | EXTENDED_RELEASE_TABLET | Freq: Once | ORAL | Status: AC
  Administered 2021-04-08: 40 meq via ORAL

## 2021-04-08 NOTE — Patient Instructions (Addendum)
Continue weighing daily and call for an overnight weight gain of > 2 pounds or a weekly weight gain of >5 pounds.    Stop losartan/ HCTZ and starting tomorrow you will take entresto 49/51mg  as 1 tablet in the morning and 1 tablet in the evening.

## 2021-04-10 ENCOUNTER — Inpatient Hospital Stay
Admission: EM | Admit: 2021-04-10 | Discharge: 2021-04-13 | DRG: 917 | Disposition: A | Discharge status: Home / Self Care | Payer: Self-pay | Attending: Hospitalist | Admitting: Hospitalist

## 2021-04-10 ENCOUNTER — Emergency Department: Payer: Self-pay

## 2021-04-10 ENCOUNTER — Other Ambulatory Visit: Payer: Self-pay

## 2021-04-10 DIAGNOSIS — I5033 Acute on chronic diastolic (congestive) heart failure: Secondary | ICD-10-CM | POA: Diagnosis present

## 2021-04-10 DIAGNOSIS — Z6841 Body Mass Index (BMI) 40.0 and over, adult: Secondary | ICD-10-CM

## 2021-04-10 DIAGNOSIS — I5020 Unspecified systolic (congestive) heart failure: Secondary | ICD-10-CM

## 2021-04-10 DIAGNOSIS — Z7982 Long term (current) use of aspirin: Secondary | ICD-10-CM

## 2021-04-10 DIAGNOSIS — Z79899 Other long term (current) drug therapy: Secondary | ICD-10-CM

## 2021-04-10 DIAGNOSIS — J45909 Unspecified asthma, uncomplicated: Secondary | ICD-10-CM | POA: Diagnosis present

## 2021-04-10 DIAGNOSIS — R61 Generalized hyperhidrosis: Secondary | ICD-10-CM

## 2021-04-10 DIAGNOSIS — Z8616 Personal history of COVID-19: Secondary | ICD-10-CM

## 2021-04-10 DIAGNOSIS — R110 Nausea: Secondary | ICD-10-CM

## 2021-04-10 DIAGNOSIS — Z8249 Family history of ischemic heart disease and other diseases of the circulatory system: Secondary | ICD-10-CM

## 2021-04-10 DIAGNOSIS — F17210 Nicotine dependence, cigarettes, uncomplicated: Secondary | ICD-10-CM | POA: Diagnosis present

## 2021-04-10 DIAGNOSIS — F1410 Cocaine abuse, uncomplicated: Secondary | ICD-10-CM | POA: Diagnosis present

## 2021-04-10 DIAGNOSIS — I130 Hypertensive heart and chronic kidney disease with heart failure and stage 1 through stage 4 chronic kidney disease, or unspecified chronic kidney disease: Secondary | ICD-10-CM | POA: Diagnosis present

## 2021-04-10 DIAGNOSIS — F17200 Nicotine dependence, unspecified, uncomplicated: Secondary | ICD-10-CM | POA: Diagnosis present

## 2021-04-10 DIAGNOSIS — N183 Chronic kidney disease, stage 3 unspecified: Secondary | ICD-10-CM | POA: Diagnosis present

## 2021-04-10 DIAGNOSIS — R1084 Generalized abdominal pain: Secondary | ICD-10-CM

## 2021-04-10 DIAGNOSIS — I248 Other forms of acute ischemic heart disease: Secondary | ICD-10-CM | POA: Diagnosis present

## 2021-04-10 DIAGNOSIS — Z20822 Contact with and (suspected) exposure to covid-19: Secondary | ICD-10-CM | POA: Diagnosis present

## 2021-04-10 DIAGNOSIS — R079 Chest pain, unspecified: Secondary | ICD-10-CM

## 2021-04-10 DIAGNOSIS — N1831 Chronic kidney disease, stage 3a: Secondary | ICD-10-CM | POA: Diagnosis present

## 2021-04-10 DIAGNOSIS — I161 Hypertensive emergency: Secondary | ICD-10-CM

## 2021-04-10 DIAGNOSIS — T405X1A Poisoning by cocaine, accidental (unintentional), initial encounter: Principal | ICD-10-CM | POA: Diagnosis present

## 2021-04-10 LAB — RESP PANEL BY RT-PCR (FLU A&B, COVID) ARPGX2
Influenza A by PCR: NEGATIVE
Influenza B by PCR: NEGATIVE
SARS Coronavirus 2 by RT PCR: NEGATIVE

## 2021-04-10 LAB — CBC
HCT: 46.5 % (ref 39.0–52.0)
Hemoglobin: 15.4 g/dL (ref 13.0–17.0)
MCH: 26.7 pg (ref 26.0–34.0)
MCHC: 33.1 g/dL (ref 30.0–36.0)
MCV: 80.7 fL (ref 80.0–100.0)
Platelets: 306 10*3/uL (ref 150–400)
RBC: 5.76 MIL/uL (ref 4.22–5.81)
RDW: 16 % — ABNORMAL HIGH (ref 11.5–15.5)
WBC: 9.1 10*3/uL (ref 4.0–10.5)
nRBC: 0 % (ref 0.0–0.2)

## 2021-04-10 LAB — BRAIN NATRIURETIC PEPTIDE: B Natriuretic Peptide: 127.7 pg/mL — ABNORMAL HIGH (ref 0.0–100.0)

## 2021-04-10 LAB — URINALYSIS, COMPLETE (UACMP) WITH MICROSCOPIC
Bacteria, UA: NONE SEEN
Bilirubin Urine: NEGATIVE
Glucose, UA: 50 mg/dL — AB
Hgb urine dipstick: NEGATIVE
Ketones, ur: NEGATIVE mg/dL
Leukocytes,Ua: NEGATIVE
Nitrite: NEGATIVE
Protein, ur: >=300 mg/dL — AB
Specific Gravity, Urine: 1.037 — ABNORMAL HIGH (ref 1.005–1.030)
Squamous Epithelial / HPF: NONE SEEN (ref 0–5)
pH: 7 (ref 5.0–8.0)

## 2021-04-10 LAB — BASIC METABOLIC PANEL
Anion gap: 10 (ref 5–15)
BUN: 28 mg/dL — ABNORMAL HIGH (ref 6–20)
CO2: 24 mmol/L (ref 22–32)
Calcium: 9.2 mg/dL (ref 8.9–10.3)
Chloride: 105 mmol/L (ref 98–111)
Creatinine, Ser: 1.89 mg/dL — ABNORMAL HIGH (ref 0.61–1.24)
GFR, Estimated: 44 mL/min — ABNORMAL LOW (ref >60)
Glucose, Bld: 146 mg/dL — ABNORMAL HIGH (ref 70–99)
Potassium: 4.1 mmol/L (ref 3.5–5.1)
Sodium: 139 mmol/L (ref 135–145)

## 2021-04-10 LAB — HEPATIC FUNCTION PANEL
ALT: 21 U/L (ref 0–44)
AST: 28 U/L (ref 15–41)
Albumin: 4.3 g/dL (ref 3.5–5.0)
Alkaline Phosphatase: 63 U/L (ref 38–126)
Bilirubin, Direct: 0.2 mg/dL (ref 0.0–0.2)
Indirect Bilirubin: 0.4 mg/dL (ref 0.3–0.9)
Total Bilirubin: 0.6 mg/dL (ref 0.3–1.2)
Total Protein: 8.3 g/dL — ABNORMAL HIGH (ref 6.5–8.1)

## 2021-04-10 LAB — LACTIC ACID, PLASMA
Lactic Acid, Venous: 1.2 mmol/L (ref 0.5–1.9)
Lactic Acid, Venous: 1.3 mmol/L (ref 0.5–1.9)

## 2021-04-10 LAB — TROPONIN I (HIGH SENSITIVITY)
Troponin I (High Sensitivity): 30 ng/L — ABNORMAL HIGH (ref <18)
Troponin I (High Sensitivity): 38 ng/L — ABNORMAL HIGH (ref <18)

## 2021-04-10 LAB — LIPASE, BLOOD: Lipase: 28 U/L (ref 11–51)

## 2021-04-10 MED ORDER — FUROSEMIDE 10 MG/ML IJ SOLN
40.0000 mg | Freq: Two times a day (BID) | INTRAMUSCULAR | Status: DC
  Filled 2021-04-10: qty 4

## 2021-04-10 MED ORDER — LABETALOL HCL 5 MG/ML IV SOLN
10.0000 mg | INTRAVENOUS | Status: DC | PRN
  Administered 2021-04-10: 10 mg via INTRAVENOUS
  Filled 2021-04-10: qty 4

## 2021-04-10 MED ORDER — ACETAMINOPHEN 650 MG RE SUPP: 650.0000 mg | Freq: Four times a day (QID) | RECTAL | Status: DC | PRN

## 2021-04-10 MED ORDER — ONDANSETRON HCL 4 MG PO TABS: 4.0000 mg | ORAL_TABLET | Freq: Four times a day (QID) | ORAL | Status: DC | PRN

## 2021-04-10 MED ORDER — ASPIRIN EC 81 MG PO TBEC
81.0000 mg | DELAYED_RELEASE_TABLET | Freq: Every day | ORAL | Status: DC
  Administered 2021-04-11 – 2021-04-13 (×3): 81 mg via ORAL
  Filled 2021-04-10 (×3): qty 1

## 2021-04-10 MED ORDER — ACETAMINOPHEN 325 MG PO TABS: 650.0000 mg | ORAL_TABLET | Freq: Four times a day (QID) | ORAL | Status: DC | PRN

## 2021-04-10 MED ORDER — MORPHINE SULFATE (PF) 4 MG/ML IV SOLN
4.0000 mg | Freq: Once | INTRAVENOUS | Status: AC
Start: 2021-04-10 — End: 2021-04-10
  Administered 2021-04-10: 4 mg via INTRAVENOUS
  Filled 2021-04-10: qty 1

## 2021-04-10 MED ORDER — ONDANSETRON HCL 4 MG/2ML IJ SOLN
4.0000 mg | Freq: Once | INTRAMUSCULAR | Status: AC
  Administered 2021-04-10: 4 mg via INTRAVENOUS

## 2021-04-10 MED ORDER — CARVEDILOL 6.25 MG PO TABS
6.2500 mg | ORAL_TABLET | Freq: Two times a day (BID) | ORAL | Status: DC
  Administered 2021-04-11 – 2021-04-13 (×5): 6.25 mg via ORAL
  Filled 2021-04-10 (×11): qty 1

## 2021-04-10 MED ORDER — ONDANSETRON HCL 4 MG/2ML IJ SOLN: 4.0000 mg | Freq: Four times a day (QID) | INTRAMUSCULAR | Status: DC | PRN

## 2021-04-10 MED ORDER — ROSUVASTATIN CALCIUM 10 MG PO TABS
20.0000 mg | ORAL_TABLET | Freq: Every day | ORAL | Status: DC
  Administered 2021-04-11 – 2021-04-13 (×3): 20 mg via ORAL
  Filled 2021-04-10 (×2): qty 1
  Filled 2021-04-10: qty 2
  Filled 2021-04-10: qty 1
  Filled 2021-04-10: qty 2

## 2021-04-10 MED ORDER — IOHEXOL 350 MG/ML SOLN
100.0000 mL | Freq: Once | INTRAVENOUS | Status: AC | PRN
  Administered 2021-04-10: 100 mL via INTRAVENOUS

## 2021-04-10 MED ORDER — HYDROCODONE-ACETAMINOPHEN 5-325 MG PO TABS: 1.0000 | ORAL_TABLET | ORAL | Status: DC | PRN

## 2021-04-10 MED ORDER — ENOXAPARIN SODIUM 80 MG/0.8ML IJ SOSY
0.5000 mg/kg | PREFILLED_SYRINGE | Freq: Every day | INTRAMUSCULAR | Status: DC
  Administered 2021-04-11 – 2021-04-12 (×3): 70 mg via SUBCUTANEOUS
  Filled 2021-04-10: qty 0.7
  Filled 2021-04-10: qty 0.8
  Filled 2021-04-10: qty 0.7

## 2021-04-10 MED ORDER — ONDANSETRON HCL 4 MG/2ML IJ SOLN
INTRAMUSCULAR | Status: AC
  Filled 2021-04-10: qty 2

## 2021-04-10 MED ORDER — ALBUTEROL SULFATE (2.5 MG/3ML) 0.083% IN NEBU: 3.0000 mL | INHALATION_SOLUTION | RESPIRATORY_TRACT | Status: DC | PRN

## 2021-04-10 MED ORDER — NICOTINE 21 MG/24HR TD PT24
21.0000 mg | MEDICATED_PATCH | Freq: Every day | TRANSDERMAL | Status: DC
  Administered 2021-04-11 – 2021-04-13 (×3): 21 mg via TRANSDERMAL
  Filled 2021-04-10 (×3): qty 1

## 2021-04-10 MED ORDER — FUROSEMIDE 10 MG/ML IJ SOLN
40.0000 mg | Freq: Once | INTRAMUSCULAR | Status: AC
  Administered 2021-04-10: 40 mg via INTRAVENOUS
  Filled 2021-04-10: qty 4

## 2021-04-10 MED ORDER — ONDANSETRON HCL 4 MG/2ML IJ SOLN
4.0000 mg | Freq: Once | INTRAMUSCULAR | Status: AC
  Administered 2021-04-10: 4 mg via INTRAVENOUS
  Filled 2021-04-10: qty 2

## 2021-04-10 MED ORDER — SACUBITRIL-VALSARTAN 49-51 MG PO TABS
1.0000 | ORAL_TABLET | Freq: Two times a day (BID) | ORAL | Status: DC
  Administered 2021-04-11 – 2021-04-13 (×6): 1 via ORAL
  Filled 2021-04-10 (×14): qty 1

## 2021-04-10 NOTE — Progress Notes (Deleted)
Patient ID: Brad [name], male    DOB: 07/09/1976, 45 y.o.   MRN: [id number]  HPI  [name] is a 45 y/o male with a history of HTN, tobacco use, substance abuse and chronic heart failure.   Echo report from 01/13/21 reviewed and showed an EF of 60-65% along with severe LVH.   Admitted 01/12/21 due to shortness of breath. Cardiology consult obtained. UDS + for cocaine. Initially given IV lasix with transition to oral diuretics. Discharged the following day. Was in the ED 01/03/21 due to shortness where he was treated and released.   He presents today for a follow-up visit with a chief complaint of   Received 80mg  IV lasix/ PO potassium given 3 days ago.   Past Medical History:  Diagnosis Date   Asthma    CHF (congestive heart failure) (HCC)    Diverticulitis    Hypertension    Past Surgical History:  Procedure Laterality Date   ANKLE SURGERY     Family History  Problem Relation Age of Onset   Hypertension Mother    Social History   Tobacco Use   Smoking status: Every Day    Packs/day: 0.50    Types: Cigarettes   Smokeless tobacco: Never  Substance Use Topics   Alcohol use: Yes    Comment: occas   No Known Allergies    Review of Systems  Constitutional:  Positive for fatigue (easily). Negative for appetite change.  HENT:  Negative for congestion, postnasal drip and sore throat.   Eyes: Negative.   Respiratory:  Positive for apnea and shortness of breath (worse when laying down). Negative for cough and chest tightness.        + snoring  Cardiovascular:  Negative for chest pain, palpitations and leg swelling.  Gastrointestinal:  Negative for abdominal distention and abdominal pain.  Endocrine: Negative.   Genitourinary: Negative.   Musculoskeletal:  Positive for arthralgias (right shoulder). Negative for back pain and neck pain.  Skin: Negative.   Allergic/Immunologic: Negative.   Neurological:  Negative for dizziness and light-headedness.   Hematological:  Negative for adenopathy. Does not bruise/bleed easily.  Psychiatric/Behavioral:  Positive for sleep disturbance (sleeping on 1 pillow ; + PND). Negative for dysphoric mood. The patient is not nervous/anxious.        Physical Exam Vitals and nursing note reviewed.  Constitutional:      Appearance: Normal appearance.  HENT:     Head: Normocephalic and atraumatic.  Cardiovascular:     Rate and Rhythm: Normal rate and regular rhythm.  Pulmonary:     Effort: Pulmonary effort is normal. No respiratory distress.     Breath sounds: No wheezing or rales.  Abdominal:     General: There is no distension.     Palpations: Abdomen is soft.     Tenderness: There is no abdominal tenderness.  Musculoskeletal:        General: No tenderness.     Cervical back: Normal range of motion and neck supple.     Right lower leg: No edema.     Left lower leg: No edema.  Skin:    General: Skin is warm and dry.  Neurological:     General: No focal deficit present.     Mental Status: He is alert and oriented to person, place, and time.  Psychiatric:        Mood and Affect: Mood normal.        Behavior: Behavior normal.  Thought Content: Thought content normal.    Assessment & Plan:  1: Acute on Chronic heart failure with preserved ejection fraction with structural changes (LVH)- - NYHA class III - euvolemic today - weighing daily and says that his weight has been increasing; reminded to call for an overnight weight gain of > 2 pounds or a weekly weight gain of > 5 pounds - weight 307.8 pounds from last visit here 3 days ago - received 80mg  IV lasix/ PO potassium 3 days ago - does not add salt to his food but does eat at while at work; reviewed the importance of limiting eating at fast food places and reading food labels for sodium content - did not show for cardiology appt with Dr. [employer] on 02/04/21 - entresto 49/51mg  BID started at last visit - he's unsure of  his fluid intake; explained that he needed to drink ~ 60 ounces of fluid daily - BNP 04/08/21 was 159.9  2: HTN with CKD- - BP - had telemedicine visit with PCP 04/10/21) 03/18/21 - BMP 04/08/21 reviewed and showed sodium 139, potassium 4.2, creatinine 1.99 and GFR 41  3: Tobacco use- - no longer smoking and is wearing nicotine patch - drinks alcohol on social occasions only (birthday/ holiday) - uses marijuana - denies any recent cocaine  4: Snoring- - patient continues to endorse snoring - patient says that he wakes himself up which then scares him and he's finding it stressful to now go to sleep because of this fear - most likely needs sleep study but will defer to PCP due to patient currently being uninsured and will need financial assistance   Medication bottles reviewed.

## 2021-04-10 NOTE — ED Notes (Signed)
Pt given urinal and is requesting to sleep at this time.

## 2021-04-10 NOTE — ED Triage Notes (Addendum)
Pt states he has been nauseous and diaphoretic with HTN since this am. Pt diaphoretic in triage. States having generalized abd pain Pt unsure of cardiac hx, states he is just supposed to see doctor tomorrow. Pt rocking back and forth in chair, grunting. Appears uncomfortable. Pt has not been weighing daily. Thinks he has HF Pt with labored breathing in triage

## 2021-04-10 NOTE — H&P (Signed)
History and Physical    Brad [name] ZOX:[id number] DOB: [birth year] DOA: 04/10/2021  PCP: Patient, No Pcp Per (Inactive)   Patient coming from: home  I have personally briefly reviewed patient[facility]  Chief Complaint: chortness of breath  HPI: Brad BadderFrank L Derden is a 45 y.o. male with medical history significant for Asthma, nicotine dependence, class III obesity, CKD stage IIIa, hypertension, hospitalized in May 2022 with acute heart failure related to uncontrolled HTN, with echo on 5/22 showing EF of 60 to 65% with grade 1 diastolic dysfunction and severe LVH, who presents to the ED with shortness of breath with minimal exertion, abdominal pain, nausea and diaphoresis.  He denies chest pain or lower extremity edema but endorses orthopnea stating he needs to sit up in a recliner to sleep.  He denies cough, fever or chills and denies vomiting or diarrhea.  ED course: On arrival, BP 219/132, pulse 68, respirations 24 with O2 sat 97% on room air.  Afebrile Blood work: Troponin 30-38, BNP 127.7.  CBC, BMP unremarkable with creatinine at baseline at 1.89.  Lipase and LFTs, and lactic acid WNL.  Urinalysis unremarkable  EKG: NSR at 63 with nonspecific ST-T wave changes  Imaging: Chest x-ray with stable cardiomegaly and mild interstitial thickening representing pulmonary edema CTA abdomen and pelvis with and without contrast with no acute abnormalities  Patient treated with IV labetalol, IV Lasix and Zofran with improvement in BP to 146/106 by admission.  Hospitalist consulted for admission.  Review of Systems: As per HPI otherwise all other systems on review of systems negative.    Past Medical History:  Diagnosis Date   Asthma    CHF (congestive heart failure) (HCC)    Diverticulitis    Hypertension     Past Surgical History:  Procedure Laterality Date   ANKLE SURGERY       reports that he has been smoking cigarettes. He has been smoking an  average of .5 packs per day. He has never used smokeless tobacco. He reports current alcohol use. He reports current drug use. Drug: Marijuana.  No Known Allergies  Family History  Problem Relation Age of Onset   Hypertension Mother       Prior to Admission medications   Medication Sig Start Date End Date Taking? Authorizing Provider  albuterol (VENTOLIN HFA) 108 (90 Base) MCG/ACT inhaler Inhale 2 puffs into the lungs every 4 (four) hours as needed for wheezing or shortness of breath. 01/13/21  Yes Arnetha CourserAmin, Sumayya, MD  aspirin EC 81 MG EC tablet Take 1 tablet (81 mg total) by mouth daily. Swallow whole. 01/14/21  Yes Arnetha CourserAmin, Sumayya, MD  carvedilol (COREG) 6.25 MG tablet Take 1 tablet (6.25 mg total) by mouth 2 (two) times daily with a meal. 03/18/21  Yes Marcine MatarJohnson, Deborah B, MD  furosemide (LASIX) 40 MG tablet Take 1 tablet (40 mg total) by mouth once daily. 02/15/21  Yes Marcine MatarJohnson, Deborah B, MD  nicotine (NICODERM CQ - DOSED IN MG/24 HOURS) 21 mg/24hr patch Place 1 patch (21 mg total) onto the skin daily. 04/08/21  Yes Clarisa KindredHackney, Tina A, FNP  rosuvastatin (CRESTOR) 20 MG tablet Take 1 tablet (20 mg total) by mouth once daily. 02/18/21 02/18/22 Yes Hackney, Tina A, FNP  sacubitril-valsartan (ENTRESTO) 49-51 MG Take 1 tablet by mouth 2 (two) times daily. 04/08/21  Yes Delma FreezeHackney, Tina A, FNP    Physical Exam: Vitals:   04/10/21 2000 04/10/21 2030 04/10/21 2057 04/10/21 2128  BP: Marland Kitchen(!)  160/94 (!) 151/124    Pulse: 67 63 70 (!) 58  Resp: (!) 21 19  (!) 21  Temp:      TempSrc:      SpO2: 97% 97% 96% 93%  Weight:      Height:         Vitals:   04/10/21 2000 04/10/21 2030 04/10/21 2057 04/10/21 2128  BP: (!) 160/94 (!) 151/124    Pulse: 67 63 70 (!) 58  Resp: (!) 21 19  (!) 21  Temp:      TempSrc:      SpO2: 97% 97% 96% 93%  Weight:      Height:          Constitutional: Alert and oriented x 3 .  Conversational dyspnea HEENT:      Head: Normocephalic and atraumatic.         Eyes: PERLA, EOMI,  Conjunctivae are normal. Sclera is non-icteric.       Mouth/Throat: Mucous membranes are moist.       Neck: Supple with no signs of meningismus. Cardiovascular: Regular rate and rhythm. No murmurs, gallops, or rubs. 2+ symmetrical distal pulses are present . No JVD. No LE edema Respiratory: Respiratory effort increased with faint bibasilar crackles Gastrointestinal: Soft, non tender, and non distended with positive bowel sounds.  Genitourinary: No CVA tenderness. Musculoskeletal: Nontender with normal range of motion in all extremities. No cyanosis, or erythema of extremities. Neurologic:  Face is symmetric. Moving all extremities. No gross focal neurologic deficits . Skin: Skin is warm, dry.  No rash or ulcers Psychiatric: Mood and affect are normal    Labs on Admission: I have personally reviewed following labs and imaging studies  CBC: Recent Labs  Lab 04/10/21 1829  WBC 9.1  HGB 15.4  HCT 46.5  MCV 80.7  PLT 306   Basic Metabolic Panel: Recent Labs  Lab 04/08/21 1530 04/10/21 1829  NA 139 139  K 4.2 4.1  CL 101 105  CO2 30 24  GLUCOSE 119* 146*  BUN 33* 28*  CREATININE 1.99* 1.89*  CALCIUM 9.1 9.2   GFR: Estimated Creatinine Clearance: 65.6 mL/min (A) (by C-G formula based on SCr of 1.89 mg/dL (H)). Liver Function Tests: Recent Labs  Lab 04/10/21 1829  AST 28  ALT 21  ALKPHOS 63  BILITOT 0.6  PROT 8.3*  ALBUMIN 4.3   Recent Labs  Lab 04/10/21 1829  LIPASE 28   No results for input(s): AMMONIA in the last 168 hours. Coagulation Profile: No results for input(s): INR, PROTIME in the last 168 hours. Cardiac Enzymes: No results for input(s): CKTOTAL, CKMB, CKMBINDEX, TROPONINI in the last 168 hours. BNP (last 3 results) No results for input(s): PROBNP in the last 8760 hours. HbA1C: No results for input(s): HGBA1C in the last 72 hours. CBG: No results for input(s): GLUCAP in the last 168 hours. Lipid Profile: No results for input(s): CHOL, HDL,  LDLCALC, TRIG, CHOLHDL, LDLDIRECT in the last 72 hours. Thyroid Function Tests: No results for input(s): TSH, T4TOTAL, FREET4, T3FREE, THYROIDAB in the last 72 hours. Anemia Panel: No results for input(s): VITAMINB12, FOLATE, FERRITIN, TIBC, IRON, RETICCTPCT in the last 72 hours. Urine analysis:    Component Value Date/Time   COLORURINE YELLOW (A) 04/10/2021 1921   APPEARANCEUR CLEAR (A) 04/10/2021 1921   APPEARANCEUR Clear 03/23/2012 1704   LABSPEC 1.037 (H) 04/10/2021 1921   LABSPEC 1.021 03/23/2012 1704   PHURINE 7.0 04/10/2021 1921   GLUCOSEU 50 (A) 04/10/2021 1921  GLUCOSEU Negative 03/23/2012 1704   HGBUR NEGATIVE 04/10/2021 1921   BILIRUBINUR NEGATIVE 04/10/2021 1921   BILIRUBINUR Negative 03/23/2012 1704   KETONESUR NEGATIVE 04/10/2021 1921   PROTEINUR >=300 (A) 04/10/2021 1921   NITRITE NEGATIVE 04/10/2021 1921   LEUKOCYTESUR NEGATIVE 04/10/2021 1921   LEUKOCYTESUR Negative 03/23/2012 1704    Radiological Exams on Admission: DG Chest Port 1 View  Result Date: 04/10/2021 CLINICAL DATA:  Cardiac chest pain. EXAM: PORTABLE CHEST 1 VIEW COMPARISON:  01/12/2021 FINDINGS: Cardiomegaly is stable. Unchanged mediastinal contours. There is mild interstitial thickening that may represent pulmonary edema, improved from prior exam. No pleural fluid. No pneumothorax. No confluent consolidation. No acute osseous abnormalities are seen. There is left shoulder arthropathy. IMPRESSION: Stable cardiomegaly. Mild interstitial thickening that may represent pulmonary edema, however is improved from exam earlier this day. Electronically Signed   By: Narda Rutherford M.D.   On: 04/10/2021 19:10   CT Angio Abd/Pel w/ and/or w/o  Result Date: 04/10/2021 CLINICAL DATA:  Pt states he has been nauseous and diaphoretic with HTN since this am. Diaphoretic. Nonlocalized acute abdominal pain. EXAM: CTA ABDOMEN AND PELVIS WITHOUT AND WITH CONTRAST TECHNIQUE: Multidetector CT imaging of the abdomen and  pelvis was performed using the standard protocol during bolus administration of intravenous contrast. Multiplanar reconstructed images and MIPs were obtained and reviewed to evaluate the vascular anatomy. CONTRAST:  OMNIPAQUE IOHEXOL 350 MG/ML SOLN COMPARISON:  None. FINDINGS: VASCULAR Aorta: Mild atherosclerotic plaque. Normal caliber aorta without aneurysm, dissection, vasculitis or significant stenosis. Celiac: Patent without evidence of aneurysm, dissection, vasculitis or significant stenosis. SMA: Patent without evidence of aneurysm, dissection, vasculitis or significant stenosis. Renals: Both renal arteries are patent without evidence of aneurysm, dissection, vasculitis, fibromuscular dysplasia or significant stenosis. IMA: Patent without evidence of aneurysm, dissection, vasculitis or significant stenosis. Inflow: Mild atherosclerotic plaque. Patent without evidence of aneurysm, dissection, vasculitis or significant stenosis. Proximal Outflow: Bilateral common femoral and visualized portions of the superficial and profunda femoral arteries are patent without evidence of aneurysm, dissection, vasculitis or significant stenosis. Veins: No obvious venous abnormality within the limitations of this arterial phase study. Review of the MIP images confirms the above findings. NON-VASCULAR Lower chest: Prominent left atrium.  No acute abnormality. Hepatobiliary: Enlarged liver. There is a 0.7 cm hyperdensity within the right hepatic lobe on arterial phase there is not visualized on the portal venous phase. Finding a seen previously on CT abdomen pelvis 12/05/2016. Otherwise no focal hepatic lesion. No gallstones, gallbladder wall thickening, or pericholecystic fluid. No biliary dilatation. Pancreas: No focal lesion. Normal pancreatic contour. No surrounding inflammatory changes. No main pancreatic ductal dilatation. Spleen: Normal in size without focal abnormality. Adrenals/Urinary Tract: No adrenal nodule  bilaterally. Bilateral kidneys enhance symmetrically. No hydronephrosis. No hydroureter. The urinary bladder is unremarkable. Stomach/Bowel: Stomach is within normal limits. No evidence of bowel wall thickening or dilatation. Scattered colonic diverticulosis. Appendix appears normal. Lymphatic: No lymphadenopathy. Reproductive: Prostate is unremarkable. Other: No intraperitoneal free fluid. No intraperitoneal free gas. No organized fluid collection. Musculoskeletal: No abdominal wall hernia or abnormality. No suspicious lytic or blastic osseous lesions. No acute displaced fracture. IMPRESSION: VASCULAR 1. No acute vascular abnormality. 2.  Aortic Atherosclerosis (ICD10-I70.0) - mild. NON-VASCULAR 1. Prominent left cardiac atrium. 2. Scattered colonic diverticulosis with no acute diverticulitis. 3. Hepatomegaly. Electronically Signed   By: Tish Frederickson M.D.   On: 04/10/2021 20:39     Assessment/Plan 45 year old male with history of asthma, nicotine dependence, class III obesity, CKD stage IIIa, hypertension, hospitalized  in May 2022 with acute heart failure related to uncontrolled HTN, with echo on 5/22 showing EF of 60 to 65% with grade 1 diastolic dysfunction and severe LVH, who presents to the ED with shortness of breath with minimal exertion.  BP in ED 219/132.,    Hypertensive emergency   Acute on chronic diastolic CHF (congestive heart failure)  -Patient with orthopnea and shortness of breath with exertion, troponin 127.7 and chest x-ray with interstitial thickening representing pulmonary edema - Troponin 30-38, likely secondary to demand ischemia - Echo from 5/22 showing EF 60 to 65%, grade 1 DD and LVH - IV Lasix - Continue home carvedilol.  Continue Entresto pending med rec - Daily weights with intake and output monitoring - BP control    CKD (chronic kidney disease), stage IIIa - Creatinine 1.89 which is at baseline    Asthma - Albuterol as needed    Tobacco dependence - NicoDerm  patch if required    Obesity, Class III, BMI 40-49.9 (morbid obesity) (HCC) - Complicating factor to overall prognosis and care    DVT prophylaxis: Lovenox  Code Status: full code  Family Communication:  none  Disposition Plan: Back to previous home environment Consults called: none  Status:At the time of admission, it appears that the appropriate admission status for this patient is INPATIENT. This is judged to be reasonable and necessary in order to provide the required intensity of service to ensure the patient's safety given the presenting symptoms, physical exam findings, and initial radiographic and laboratory data in the context of their  Comorbid conditions.   Patient requires inpatient status due to high intensity of service, high risk for further deterioration and high frequency of surveillance required.   I certify that at the point of admission it is my clinical judgment that the patient will require inpatient hospital care spanning beyond 2 midnights     Andris Baumann MD [facility]     04/10/2021, 11:17 PM

## 2021-04-10 NOTE — ED Provider Notes (Signed)
[facility] Emergency Department Provider Note  ____________________________________________   Event Date/Time   First MD Initiated Contact with Patient 04/10/21 1911     (approximate)  I have reviewed the triage vital signs and the nursing notes.   HISTORY  Chief Complaint Nausea   HPI Pincus BadderFrank L Hargraves is a 45 y.o. male who reports that several hours ago he became nauseated had a bellyache across his whole belly.  He was sweating and his blood pressure went up very high.  He does not appear to be comfortable now.  He does not sweat any longer though but still has pain in his belly that is deep inside.  It does not radiate he just kind of deep bad pain.  He cannot describe it any further.  It is diffuse in his belly worse in the upper part above the umbilicus.  Nothing seems to make it better or worse.  Has been constant since this morning.         Past Medical History:  Diagnosis Date   Asthma    CHF (congestive heart failure) (HCC)    Diverticulitis    Hypertension     Patient Active Problem List   Diagnosis Date Noted   Obesity, Class III, BMI 40-49.9 (morbid obesity) (HCC) 04/10/2021   Hypertensive emergency 04/10/2021   Acute on chronic diastolic CHF (congestive heart failure) (HCC) 02/15/2021   Hypertensive kidney disease with stage 3a chronic kidney disease (HCC) 02/15/2021   Type 2 diabetes mellitus with morbid obesity (HCC) 02/15/2021   Tobacco dependence 02/15/2021   Cocaine abuse in remission (HCC) 02/15/2021   Marijuana user 02/15/2021   Acute pulmonary edema (HCC)    Acute CHF (HCC) 01/12/2021   Asthma    Hypertension    Cocaine abuse (HCC) 04/17/2020   Pneumonia due to COVID-19 virus 04/16/2020   Acute hypoxemic respiratory failure due to COVID-19 (HCC) 04/16/2020   Benign essential HTN 04/16/2020   CKD (chronic kidney disease), stage IIIa 04/16/2020   Diverticulitis 10/06/2018   Hypertensive urgency 04/18/2016   Intractable  nausea and vomiting 05/31/2015    Past Surgical History:  Procedure Laterality Date   ANKLE SURGERY      Prior to Admission medications   Medication Sig Start Date End Date Taking? Authorizing Provider  albuterol (VENTOLIN HFA) 108 (90 Base) MCG/ACT inhaler Inhale 2 puffs into the lungs every 4 (four) hours as needed for wheezing or shortness of breath. 01/13/21  Yes Arnetha CourserAmin, Sumayya, MD  aspirin EC 81 MG EC tablet Take 1 tablet (81 mg total) by mouth daily. Swallow whole. 01/14/21  Yes Arnetha CourserAmin, Sumayya, MD  carvedilol (COREG) 6.25 MG tablet Take 1 tablet (6.25 mg total) by mouth 2 (two) times daily with a meal. 03/18/21  Yes Marcine MatarJohnson, Deborah B, MD  furosemide (LASIX) 40 MG tablet Take 1 tablet (40 mg total) by mouth once daily. 02/15/21  Yes Marcine MatarJohnson, Deborah B, MD  nicotine (NICODERM CQ - DOSED IN MG/24 HOURS) 21 mg/24hr patch Place 1 patch (21 mg total) onto the skin daily. 04/08/21  Yes Clarisa KindredHackney, Tina A, FNP  rosuvastatin (CRESTOR) 20 MG tablet Take 1 tablet (20 mg total) by mouth once daily. 02/18/21 02/18/22 Yes Hackney, Tina A, FNP  sacubitril-valsartan (ENTRESTO) 49-51 MG Take 1 tablet by mouth 2 (two) times daily. 04/08/21  Yes Delma FreezeHackney, Tina A, FNP    Allergies Patient has no known allergies.  Family History  Problem Relation Age of Onset   Hypertension Mother  Social History Social History   Tobacco Use   Smoking status: Every Day    Packs/day: 0.50    Types: Cigarettes   Smokeless tobacco: Never  Substance Use Topics   Alcohol use: Yes    Comment: occas   Drug use: Yes    Types: Marijuana    Comment: daily    Review of Systems  Constitutional: No fever/chills Eyes: No visual changes. ENT: No sore throat. Cardiovascular: Denies chest pain. Respiratory: Denies shortness of breath. Gastrointestinal:abdominal pain.   nausea, no vomiting.  No diarrhea.  No constipation. Genitourinary: Negative for dysuria. Musculoskeletal: Negative for back pain. Skin: Negative for  rash. Neurological: Negative for headaches, focal weakness  ____________________________________________   PHYSICAL EXAM:  VITAL SIGNS: ED Triage Vitals  Enc Vitals Group     BP 04/10/21 1826 (!) 219/132     Pulse Rate 04/10/21 1826 68     Resp 04/10/21 1821 (!) 24     Temp 04/10/21 1821 97.8 F (36.6 C)     Temp Source 04/10/21 1821 Oral     SpO2 04/10/21 1826 97 %     Weight 04/10/21 1822 (!) 307 lb (139.3 kg)     Height 04/10/21 1822 5\' 6"  (1.676 m)     Head Circumference --      Peak Flow --      Pain Score 04/10/21 1822 8     Pain Loc --      Pain Edu? --      Excl. in GC? --     Constitutional: Alert and oriented.  Looks uncomfortable Eyes: Conjunctivae are normal. PERRL. EOMI. Head: Atraumatic. Nose: No congestion/rhinnorhea. Mouth/Throat: Mucous membranes are moist.  Oropharynx non-erythematous. Neck: No stridor.   Cardiovascular: Normal rate, regular rhythm. Grossly normal heart sounds.  Good peripheral circulation. Respiratory: Normal respiratory effort.  No retractions. Lungs CTAB. Gastrointestinal: Soft mild diffuse tenderness no distention. No abdominal bruits. No CVA tenderness. Musculoskeletal: No lower extremity tenderness nor edema.   Neurologic:  Normal speech and language. No gross focal neurologic deficits are appreciated.  Skin:  Skin is warm, dry and intact. No rash noted.   ____________________________________________   LABS (all labs ordered are listed, but only abnormal results are displayed)  Labs Reviewed  BASIC METABOLIC PANEL - Abnormal; Notable for the following components:      Result Value   Glucose, Bld 146 (*)    BUN 28 (*)    Creatinine, Ser 1.89 (*)    GFR, Estimated 44 (*)    All other components within normal limits  CBC - Abnormal; Notable for the following components:   RDW 16.0 (*)    All other components within normal limits  HEPATIC FUNCTION PANEL - Abnormal; Notable for the following components:   Total Protein 8.3  (*)    All other components within normal limits  URINALYSIS, COMPLETE (UACMP) WITH MICROSCOPIC - Abnormal; Notable for the following components:   Color, Urine YELLOW (*)    APPearance CLEAR (*)    Specific Gravity, Urine 1.037 (*)    Glucose, UA 50 (*)    Protein, ur >=300 (*)    All other components within normal limits  BRAIN NATRIURETIC PEPTIDE - Abnormal; Notable for the following components:   B Natriuretic Peptide 127.7 (*)    All other components within normal limits  TROPONIN I (HIGH SENSITIVITY) - Abnormal; Notable for the following components:   Troponin I (High Sensitivity) 30 (*)    All other components within normal  limits  TROPONIN I (HIGH SENSITIVITY) - Abnormal; Notable for the following components:   Troponin I (High Sensitivity) 38 (*)    All other components within normal limits  RESP PANEL BY RT-PCR (FLU A&B, COVID) ARPGX2  LIPASE, BLOOD  LACTIC ACID, PLASMA  LACTIC ACID, PLASMA  CBC  CREATININE, SERUM   ____________________________________________  EKG  EKG read interpreted by me shows normal sinus rhythm rate of 66 first-degree AV block and PVCs similar to prior EKG from May. ____________________________________________  RADIOLOGY Jill Poling, personally viewed and evaluated these images (plain radiographs) as part of my medical decision making, as well as reviewing the written report by the radiologist.  ED MD interpretation: Chest x-ray read by radiology reviewed by me shows some cardiomegaly which could radiology feels unstable possible pulmonary edema.  Official radiology report(s): DG Chest Port 1 View  Result Date: 04/10/2021 CLINICAL DATA:  Cardiac chest pain. EXAM: PORTABLE CHEST 1 VIEW COMPARISON:  01/12/2021 FINDINGS: Cardiomegaly is stable. Unchanged mediastinal contours. There is mild interstitial thickening that may represent pulmonary edema, improved from prior exam. No pleural fluid. No pneumothorax. No confluent consolidation. No  acute osseous abnormalities are seen. There is left shoulder arthropathy. IMPRESSION: Stable cardiomegaly. Mild interstitial thickening that may represent pulmonary edema, however is improved from exam earlier this day. Electronically Signed   By: Narda Rutherford M.D.   On: 04/10/2021 19:10   CT Angio Abd/Pel w/ and/or w/o  Result Date: 04/10/2021 CLINICAL DATA:  Pt states he has been nauseous and diaphoretic with HTN since this am. Diaphoretic. Nonlocalized acute abdominal pain. EXAM: CTA ABDOMEN AND PELVIS WITHOUT AND WITH CONTRAST TECHNIQUE: Multidetector CT imaging of the abdomen and pelvis was performed using the standard protocol during bolus administration of intravenous contrast. Multiplanar reconstructed images and MIPs were obtained and reviewed to evaluate the vascular anatomy. CONTRAST:  OMNIPAQUE IOHEXOL 350 MG/ML SOLN COMPARISON:  None. FINDINGS: VASCULAR Aorta: Mild atherosclerotic plaque. Normal caliber aorta without aneurysm, dissection, vasculitis or significant stenosis. Celiac: Patent without evidence of aneurysm, dissection, vasculitis or significant stenosis. SMA: Patent without evidence of aneurysm, dissection, vasculitis or significant stenosis. Renals: Both renal arteries are patent without evidence of aneurysm, dissection, vasculitis, fibromuscular dysplasia or significant stenosis. IMA: Patent without evidence of aneurysm, dissection, vasculitis or significant stenosis. Inflow: Mild atherosclerotic plaque. Patent without evidence of aneurysm, dissection, vasculitis or significant stenosis. Proximal Outflow: Bilateral common femoral and visualized portions of the superficial and profunda femoral arteries are patent without evidence of aneurysm, dissection, vasculitis or significant stenosis. Veins: No obvious venous abnormality within the limitations of this arterial phase study. Review of the MIP images confirms the above findings. NON-VASCULAR Lower chest: Prominent left  atrium.  No acute abnormality. Hepatobiliary: Enlarged liver. There is a 0.7 cm hyperdensity within the right hepatic lobe on arterial phase there is not visualized on the portal venous phase. Finding a seen previously on CT abdomen pelvis 12/05/2016. Otherwise no focal hepatic lesion. No gallstones, gallbladder wall thickening, or pericholecystic fluid. No biliary dilatation. Pancreas: No focal lesion. Normal pancreatic contour. No surrounding inflammatory changes. No main pancreatic ductal dilatation. Spleen: Normal in size without focal abnormality. Adrenals/Urinary Tract: No adrenal nodule bilaterally. Bilateral kidneys enhance symmetrically. No hydronephrosis. No hydroureter. The urinary bladder is unremarkable. Stomach/Bowel: Stomach is within normal limits. No evidence of bowel wall thickening or dilatation. Scattered colonic diverticulosis. Appendix appears normal. Lymphatic: No lymphadenopathy. Reproductive: Prostate is unremarkable. Other: No intraperitoneal free fluid. No intraperitoneal free gas. No organized fluid  collection. Musculoskeletal: No abdominal wall hernia or abnormality. No suspicious lytic or blastic osseous lesions. No acute displaced fracture. IMPRESSION: VASCULAR 1. No acute vascular abnormality. 2.  Aortic Atherosclerosis (ICD10-I70.0) - mild. NON-VASCULAR 1. Prominent left cardiac atrium. 2. Scattered colonic diverticulosis with no acute diverticulitis. 3. Hepatomegaly. Electronically Signed   By: Tish Frederickson M.D.   On: 04/10/2021 20:39    ____________________________________________   PROCEDURES  Procedure(s) performed (including Critical Care):  Procedures   ____________________________________________   INITIAL IMPRESSION / ASSESSMENT AND PLAN / ED COURSE Patient's blood pressure is quite high 215 systolic when I walk in the room.  He is not sweating he is awake alert and oriented.  He has no focal neurological findings and no headache currently.  I will begin  treating him with some labetalol to try to get the blood pressure down.  We will get a CT with contrast to evaluate his abdomen with particular attention to blood flow.  I am not can order an angiogram however.   ----------------------------------------- 11:57 PM on 04/10/2021 ----------------------------------------- Patient's blood pressure comes down nicely with 1 dose of labetalol and some pain medication and Lasix.  He CHF improves.  I am worried about his abdominal pain nausea and sweating being an anginal equivalent.  Additionally I am worried about his extreme hypertension and CHF which appear to me to be hypertensive emergency.  We will get him in the hospital and observe him overnight make sure that he is continuing to improve before discharging him later.             ____________________________________________   FINAL CLINICAL IMPRESSION(S) / ED DIAGNOSES  Final diagnoses:  Systolic congestive heart failure, unspecified HF chronicity (HCC)  Hypertensive emergency  Generalized abdominal pain  Sweating increase  Nausea     ED Discharge Orders     None        Note:  This document was prepared using Dragon voice recognition software and may include unintentional dictation errors.    Arnaldo Natal, MD 04/10/21 [phone]

## 2021-04-10 NOTE — ED Notes (Addendum)
Pt is asleep at this time. Equal rise and fall of the chest. Oxygen removed from the pt at this time.

## 2021-04-10 NOTE — Progress Notes (Signed)
PHARMACIST - PHYSICIAN COMMUNICATION  CONCERNING:  Enoxaparin (Lovenox) for DVT Prophylaxis    RECOMMENDATION: Patient was prescribed enoxaparin 40mg  q24 hours for VTE prophylaxis.   Filed Weights   04/10/21 1822  Weight: (!) 139.3 kg (307 lb)    Body mass index is 49.55 kg/m.  Estimated Creatinine Clearance: 65.6 mL/min (A) (by C-G formula based on SCr of 1.89 mg/dL (H)).   Based on [facility] policy patient is candidate for enoxaparin 0.5mg /kg TBW SQ every 24 hours based on BMI being >30.  DESCRIPTION: Pharmacy has adjusted enoxaparin dose per [facility] policy.  Patient is now receiving enoxaparin 70 mg every 24 hours    [facility] 04/10/2021 11:28 PM

## 2021-04-11 ENCOUNTER — Ambulatory Visit: Payer: Self-pay | Admitting: Family

## 2021-04-11 ENCOUNTER — Encounter: Payer: Self-pay | Admitting: Internal Medicine

## 2021-04-11 LAB — URINE DRUG SCREEN, QUALITATIVE (ARMC ONLY)
Amphetamines, Ur Screen: NOT DETECTED
Barbiturates, Ur Screen: NOT DETECTED
Benzodiazepine, Ur Scrn: NOT DETECTED
Cannabinoid 50 Ng, Ur ~~LOC~~: POSITIVE — AB
Cocaine Metabolite,Ur ~~LOC~~: POSITIVE — AB
MDMA (Ecstasy)Ur Screen: NOT DETECTED
Methadone Scn, Ur: NOT DETECTED
Opiate, Ur Screen: POSITIVE — AB
Phencyclidine (PCP) Ur S: NOT DETECTED
Tricyclic, Ur Screen: NOT DETECTED

## 2021-04-11 MED ORDER — ORAL CARE MOUTH RINSE
15.0000 mL | Freq: Two times a day (BID) | OROMUCOSAL | Status: DC
  Administered 2021-04-12 (×3): 15 mL via OROMUCOSAL

## 2021-04-11 MED ORDER — HYDRALAZINE HCL 25 MG PO TABS
25.0000 mg | ORAL_TABLET | Freq: Four times a day (QID) | ORAL | Status: DC
  Administered 2021-04-11 – 2021-04-12 (×5): 25 mg via ORAL
  Filled 2021-04-11 (×4): qty 1

## 2021-04-11 MED ORDER — HYDRALAZINE HCL 50 MG PO TABS: 25.0000 mg | ORAL_TABLET | Freq: Four times a day (QID) | ORAL | Status: DC | PRN

## 2021-04-11 MED ORDER — FUROSEMIDE 10 MG/ML IJ SOLN
60.0000 mg | Freq: Two times a day (BID) | INTRAMUSCULAR | Status: DC
  Administered 2021-04-11 – 2021-04-13 (×5): 60 mg via INTRAVENOUS
  Filled 2021-04-11: qty 6
  Filled 2021-04-11: qty 8
  Filled 2021-04-11 (×2): qty 6

## 2021-04-11 NOTE — Progress Notes (Signed)
Patient failed to provide requested proof of income information.  [facility] unable to provide additional medication assistance until eligibility for program has been determined.  Sherilyn Dacosta Care Manager [facility]   [address] [place], [place]  [address]   April 11, 2021    Earlean Polka, Montez Hageman [address] [place], [place]  [address]  Dear Homero Fellers:  This is to inform you that you are no longer eligible to receive medication assistance at [facility].  The reason(s) are:    _____Your total gross monthly household income exceeds 250% of the Federal Poverty Level.   _____Tangible assets (savings, checking, stocks/bonds, pension, retirement, etc.) exceeds our limit  _____You are eligible to receive benefits from [facility], [facility] or HIV Medication              Assistance Program _____You are eligible to receive benefits from a Medicare Part "D" plan _____You have prescription insurance  _____You are not an [facility] resident __X__Failure to provide all requested documentation (proof of income for 2022, and/or Patient Intake Application, DOH Attestation, Contract, etc).    Medication assistance will resume once all requested documentation has been returned to our clinic.  If you have questions, please contact our clinic at [phone].    Thank you,  [facility]

## 2021-04-11 NOTE — Progress Notes (Signed)
PROGRESS NOTE    Brad [name]  CHY:[id number] DOB: [birth year] DOA: 04/10/2021 PCP: Patient, No Pcp Per (Inactive)  243A/243A-AA   Assessment & Plan:   Principal Problem:   Hypertensive emergency Active Problems:   CKD (chronic kidney disease), stage IIIa   Asthma   Acute on chronic diastolic CHF (congestive heart failure) (HCC)   Tobacco dependence   Obesity, Class III, BMI 40-49.9 (morbid obesity) (HCC)   Brad [name] is a 45 y.o. male with medical history significant for Asthma, nicotine dependence, class III obesity, CKD stage IIIa, hypertension, hospitalized in May 2022 with acute heart failure related to uncontrolled HTN, with echo on 5/22 showing EF of 60 to 65% with grade 1 diastolic dysfunction and severe LVH, who presents to the ED with shortness of breath with minimal exertion, abdominal pain, nausea and diaphoresis.  He denies chest pain or lower extremity edema but endorses orthopnea stating he needs to sit up in a recliner to sleep.      Acute on chronic diastolic CHF (congestive heart failure)  -Patient with orthopnea and shortness of breath with exertion, BNP 127.7 and chest x-ray with interstitial thickening representing pulmonary edema - Echo from 5/22 showing EF 60 to 65%, grade 1 DD and LVH - started on IV Lasix Plan: --cont IV lasix 60 mg BID --Strict I/O    Hypertensive emergency --BP in ED 219/132.  Questionable compliance with home BP medication. Plan: --cont home coreg, Entresto, hydralazine --cont diuresis  Mild trop elevation 2/2 demand ischemia - Troponin 30-38    CKD (chronic kidney disease), stage IIIa - Creatinine 1.89 which is at baseline     Asthma - albuterol neb PRN     Tobacco dependence - nicotine patch     Obesity, Class III, BMI 40-49.9 (morbid obesity) (HCC) - Complicating factor to overall prognosis and care  Active and chronic cocaine use --likely the cause of pt's early CHF --cessation advised   DVT prophylaxis:  Lovenox SQ Code Status: Full code  Family Communication:  Level of care: Progressive Cardiac Dispo:   The patient is from: home Anticipated d/c is to: home Anticipated d/c date is: 1-2 days Patient currently is not medically ready to d/c due to: IV diuresis   Subjective and Interval History:  Pt reported N/V resolved.  No abdominal pain.   Objective: Vitals:   04/11/21 2100 04/11/21 2205 04/11/21 2258 04/11/21 2306  BP:  (!) 141/85  (!) 165/71  Pulse: (!) 58 81  80  Resp: 19 (!) 27  20  Temp:  98.9 F (37.2 C)  98.9 F (37.2 C)  TempSrc:  Oral  Oral  SpO2: 93% 93%  93%  Weight:   (!) 136.4 kg   Height:   5\' 6"  (1.676 m)     Intake/Output Summary (Last 24 hours) at 04/12/2021 0013 Last data filed at 04/11/2021 1953 Gross per 24 hour  Intake --  Output 2025 ml  Net -2025 ml   Filed Weights   04/10/21 1822 04/11/21 2258  Weight: (!) 139.3 kg (!) 136.4 kg    Examination:   Constitutional: NAD, AAOx3 HEENT: conjunctivae and lids normal, EOMI CV: No cyanosis.   RESP: normal respiratory effort Extremities: No effusions, edema in BLE SKIN: warm, dry Neuro: II - XII grossly intact.   Psych: Normal mood and affect.  Appropriate judgement and reason   Data Reviewed: I have personally reviewed following labs and imaging studies  CBC: Recent Labs  Lab 04/10/21 1829  WBC  9.1  HGB 15.4  HCT 46.5  MCV 80.7  PLT 306   Basic Metabolic Panel: Recent Labs  Lab 04/08/21 1530 04/10/21 1829  NA 139 139  K 4.2 4.1  CL 101 105  CO2 30 24  GLUCOSE 119* 146*  BUN 33* 28*  CREATININE 1.99* 1.89*  CALCIUM 9.1 9.2   GFR: Estimated Creatinine Clearance: 64.8 mL/min (A) (by C-G formula based on SCr of 1.89 mg/dL (H)). Liver Function Tests: Recent Labs  Lab 04/10/21 1829  AST 28  ALT 21  ALKPHOS 63  BILITOT 0.6  PROT 8.3*  ALBUMIN 4.3   Recent Labs  Lab 04/10/21 1829  LIPASE 28   No results for input(s): AMMONIA in the last 168 hours. Coagulation  Profile: No results for input(s): INR, PROTIME in the last 168 hours. Cardiac Enzymes: No results for input(s): CKTOTAL, CKMB, CKMBINDEX, TROPONINI in the last 168 hours. BNP (last 3 results) No results for input(s): PROBNP in the last 8760 hours. HbA1C: No results for input(s): HGBA1C in the last 72 hours. CBG: No results for input(s): GLUCAP in the last 168 hours. Lipid Profile: No results for input(s): CHOL, HDL, LDLCALC, TRIG, CHOLHDL, LDLDIRECT in the last 72 hours. Thyroid Function Tests: No results for input(s): TSH, T4TOTAL, FREET4, T3FREE, THYROIDAB in the last 72 hours. Anemia Panel: No results for input(s): VITAMINB12, FOLATE, FERRITIN, TIBC, IRON, RETICCTPCT in the last 72 hours. Sepsis Labs: Recent Labs  Lab 04/10/21 1829 04/10/21 1934  LATICACIDVEN 1.2 1.3    Recent Results (from the past 240 hour(s))  Resp Panel by RT-PCR (Flu A&B, Covid) Nasopharyngeal Swab     Status: None   Collection Time: 04/10/21 10:30 PM   Specimen: Nasopharyngeal Swab; Nasopharyngeal(NP) swabs in vial transport medium  Result Value Ref Range Status   SARS Coronavirus 2 by RT PCR NEGATIVE NEGATIVE Final    Comment: (NOTE) SARS-CoV-2 target nucleic acids are NOT DETECTED.  The SARS-CoV-2 RNA is generally detectable in upper respiratory specimens during the acute phase of infection. The lowest concentration of SARS-CoV-2 viral copies this assay can detect is 138 copies/mL. A negative result does not preclude SARS-Cov-2 infection and should not be used as the sole basis for treatment or other patient management decisions. A negative result may occur with  improper specimen collection/handling, submission of specimen other than nasopharyngeal swab, presence of viral mutation(s) within the areas targeted by this assay, and inadequate number of viral copies(<138 copies/mL). A negative result must be combined with clinical observations, patient history, and epidemiological information. The  expected result is Negative.  Fact Sheet for Patients:  BloggerCourse.com  Fact Sheet for Healthcare Providers:  SeriousBroker.it  This test is no t yet approved or cleared by the [place] FDA and  has been authorized for detection and/or diagnosis of SARS-CoV-2 by FDA under an Emergency Use Authorization (EUA). This EUA will remain  in effect (meaning this test can be used) for the duration of the COVID-19 declaration under Section 564(b)(1) of the Act, 21 U.S.C.section 360bbb-3(b)(1), unless the authorization is terminated  or revoked sooner.       Influenza A by PCR NEGATIVE NEGATIVE Final   Influenza B by PCR NEGATIVE NEGATIVE Final    Comment: (NOTE) The Xpert Xpress SARS-CoV-2/FLU/RSV plus assay is intended as an aid in the diagnosis of influenza from Nasopharyngeal swab specimens and should not be used as a sole basis for treatment. Nasal washings and aspirates are unacceptable for Xpert Xpress SARS-CoV-2/FLU/RSV testing.  Fact Sheet for  Patients: BloggerCourse.comhttps://www.fda.gov/media/152166/download  Fact Sheet for Healthcare Providers: SeriousBroker.ithttps://www.fda.gov/media/152162/download  This test is not yet approved or cleared by the [place] FDA and has been authorized for detection and/or diagnosis of SARS-CoV-2 by FDA under an Emergency Use Authorization (EUA). This EUA will remain in effect (meaning this test can be used) for the duration of the COVID-19 declaration under Section 564(b)(1) of the Act, 21 U.S.C. section 360bbb-3(b)(1), unless the authorization is terminated or revoked.  Performed at [facility], [address] [place], [place] [id number]       Radiology [facility] [place] [facility]  Result Date: 04/10/2021 CLINICAL DATA:  Cardiac chest pain. EXAM: PORTABLE CHEST 1 VIEW COMPARISON:  01/12/2021 FINDINGS: Cardiomegaly is stable. Unchanged mediastinal contours. There is mild interstitial  thickening that may represent pulmonary edema, improved from prior exam. No pleural fluid. No pneumothorax. No confluent consolidation. No acute osseous abnormalities are seen. There is left shoulder arthropathy. IMPRESSION: Stable cardiomegaly. Mild interstitial thickening that may represent pulmonary edema, however is improved from exam earlier this day. Electronically Signed   By: Narda RutherfordMelanie  Sanford M.D.   On: 04/10/2021 19:10   CT Angio Abd/Pel w/ and/or w/o  Result Date: 04/10/2021 CLINICAL DATA:  Pt states he has been nauseous and diaphoretic with HTN since this am. Diaphoretic. Nonlocalized acute abdominal pain. EXAM: CTA ABDOMEN AND PELVIS WITHOUT AND WITH CONTRAST TECHNIQUE: Multidetector CT imaging of the abdomen and pelvis was performed using the standard protocol during bolus administration of intravenous contrast. Multiplanar reconstructed images and MIPs were obtained and reviewed to evaluate the vascular anatomy. CONTRAST:  100mL OMNIPAQUE IOHEXOL 350 MG/ML SOLN COMPARISON:  None. FINDINGS: VASCULAR Aorta: Mild atherosclerotic plaque. Normal caliber aorta without aneurysm, dissection, vasculitis or significant stenosis. Celiac: Patent without evidence of aneurysm, dissection, vasculitis or significant stenosis. SMA: Patent without evidence of aneurysm, dissection, vasculitis or significant stenosis. Renals: Both renal arteries are patent without evidence of aneurysm, dissection, vasculitis, fibromuscular dysplasia or significant stenosis. IMA: Patent without evidence of aneurysm, dissection, vasculitis or significant stenosis. Inflow: Mild atherosclerotic plaque. Patent without evidence of aneurysm, dissection, vasculitis or significant stenosis. Proximal Outflow: Bilateral common femoral and visualized portions of the superficial and profunda femoral arteries are patent without evidence of aneurysm, dissection, vasculitis or significant stenosis. Veins: No obvious venous abnormality within the  limitations of this arterial phase study. Review of the MIP images confirms the above findings. NON-VASCULAR Lower chest: Prominent left atrium.  No acute abnormality. Hepatobiliary: Enlarged liver. There is a 0.7 cm hyperdensity within the right hepatic lobe on arterial phase there is not visualized on the portal venous phase. Finding a seen previously on CT abdomen pelvis 12/05/2016. Otherwise no focal hepatic lesion. No gallstones, gallbladder wall thickening, or pericholecystic fluid. No biliary dilatation. Pancreas: No focal lesion. Normal pancreatic contour. No surrounding inflammatory changes. No main pancreatic ductal dilatation. Spleen: Normal in size without focal abnormality. Adrenals/Urinary Tract: No adrenal nodule bilaterally. Bilateral kidneys enhance symmetrically. No hydronephrosis. No hydroureter. The urinary bladder is unremarkable. Stomach/Bowel: Stomach is within normal limits. No evidence of bowel wall thickening or dilatation. Scattered colonic diverticulosis. Appendix appears normal. Lymphatic: No lymphadenopathy. Reproductive: Prostate is unremarkable. Other: No intraperitoneal free fluid. No intraperitoneal free gas. No organized fluid collection. Musculoskeletal: No abdominal wall hernia or abnormality. No suspicious lytic or blastic osseous lesions. No acute displaced fracture. IMPRESSION: VASCULAR 1. No acute vascular abnormality. 2.  Aortic Atherosclerosis (ICD10-I70.0) - mild. NON-VASCULAR 1. Prominent left cardiac atrium. 2. Scattered colonic diverticulosis with no acute  diverticulitis. 3. Hepatomegaly. Electronically Signed   By: Tish Frederickson M.D.   On: 04/10/2021 20:39     Scheduled Meds:  aspirin EC  81 mg Oral Daily   carvedilol  6.25 mg Oral BID WC   enoxaparin (LOVENOX) injection  0.5 mg/kg Subcutaneous Q2200   furosemide  60 mg Intravenous BID   hydrALAZINE  25 mg Oral Q6H   mouth rinse  15 mL Mouth Rinse BID   nicotine  21 mg Transdermal Daily   rosuvastatin  20  mg Oral Daily   sacubitril-valsartan  1 tablet Oral BID   Continuous Infusions:   LOS: 2 days     Darlin Priestly, MD [facility] If 7PM-7AM, please contact night-coverage 04/12/2021, 12:13 AM

## 2021-04-11 NOTE — ED Notes (Signed)
Pharmacy messaged for missing med 

## 2021-04-11 NOTE — Consult Note (Addendum)
   Heart failure nurse navigator note  HFpEF 60 to 65%.  Grade 1 diastolic dysfunction.  Severe LVH.  He presented to the emergency room with complaints of shortness of breath, abdominal pain nausea and diaphoresis and PND.  Comorbidities:  Asthma Continued tobacco abuse Obesity Chronic kidney disease stage III Hypertension  Medications:  Furosemide 60 mg twice daily Aspirin 81 mg daily Carvedilol 6.25 mg twice a day Crestor 20 mg daily Entresto 49/51 mg twice a day  Labs:  Sodium 139, potassium 4.1, chloride 105, CO2 24, BUN 28, creatinine 1.89, BNP 127 Chest x-ray revealed cardiomegaly with pulmonary edema  Assessment:  General-he is awake and alert lying on a gurney in no acute distress  HEENT-pupils are equal normocephalic  Cardiac-heart tones of regular rate and rhythm  Chest-breath sounds are diminished to posterior auscultation  Abdomen-obese rounded soft nontender  Musculoskeletal-no lower extremity edema noted.  Neurologic-speech is clear moves all extremities without difficulty  Psych-is pleasant and appropriate.   Initial meeting with patient.  He states after he had seen Inetta Fermo in the outpatient heart failure clinic on Monday and received IV Lasix. States that on  Monday night and Tuesday night he slept well.  States Wednesday he woke up and felt nauseated but ate breakfast and continued on to work, where there he was more nauseated and became diaphoretic and vomited.  Then proceeded to the emergency room.  Prior to this week patient had gone 2 weeks without his medications due to not having the money to pay for them.  Discussed with him that if this ever happens again if he is getting down to his last days of medications to please give the outpatient heart failure clinic a call  so he does not have to go without medications  Patient works at [employer] tends to eat his meals there.  Discussed carrying his own meal. We then  we looked up on the Internet  sodium content of some of the foods from there.  Discussed 2000  mg sodium diet restriction.  He states in addition to eating at [employer] he and his fiance had been to eat out at restaurants more here lately.  Also discussed the importance of sticking with the fluid restriction of 64 ounces in 24 hours, using ice in place of drinking a lot of fluids  Daily weights and recording, what to report to physician.  He states he also has a blood pressure cuff and recommended taking daily blood pressure readings and recording and bringing those to his appointments.     Tresa Endo RN CHFN

## 2021-04-12 ENCOUNTER — Encounter: Payer: Self-pay | Admitting: Internal Medicine

## 2021-04-12 LAB — BASIC METABOLIC PANEL
Anion gap: 10 (ref 5–15)
BUN: 28 mg/dL — ABNORMAL HIGH (ref 6–20)
CO2: 26 mmol/L (ref 22–32)
Calcium: 8.6 mg/dL — ABNORMAL LOW (ref 8.9–10.3)
Chloride: 100 mmol/L (ref 98–111)
Creatinine, Ser: 2 mg/dL — ABNORMAL HIGH (ref 0.61–1.24)
GFR, Estimated: 41 mL/min — ABNORMAL LOW (ref >60)
Glucose, Bld: 116 mg/dL — ABNORMAL HIGH (ref 70–99)
Potassium: 3.4 mmol/L — ABNORMAL LOW (ref 3.5–5.1)
Sodium: 136 mmol/L (ref 135–145)

## 2021-04-12 LAB — CBC
HCT: 47.1 % (ref 39.0–52.0)
Hemoglobin: 15.3 g/dL (ref 13.0–17.0)
MCH: 27.3 pg (ref 26.0–34.0)
MCHC: 32.5 g/dL (ref 30.0–36.0)
MCV: 84 fL (ref 80.0–100.0)
Platelets: 261 10*3/uL (ref 150–400)
RBC: 5.61 MIL/uL (ref 4.22–5.81)
RDW: 16.3 % — ABNORMAL HIGH (ref 11.5–15.5)
WBC: 7 10*3/uL (ref 4.0–10.5)
nRBC: 0 % (ref 0.0–0.2)

## 2021-04-12 LAB — MAGNESIUM: Magnesium: 2.1 mg/dL (ref 1.7–2.4)

## 2021-04-12 MED ORDER — HYDRALAZINE HCL 50 MG PO TABS
100.0000 mg | ORAL_TABLET | Freq: Three times a day (TID) | ORAL | Status: DC
  Administered 2021-04-13 (×2): 100 mg via ORAL
  Filled 2021-04-12 (×11): qty 2

## 2021-04-12 MED ORDER — POTASSIUM CHLORIDE CRYS ER 20 MEQ PO TBCR
40.0000 meq | EXTENDED_RELEASE_TABLET | Freq: Once | ORAL | Status: AC
  Administered 2021-04-12: 40 meq via ORAL
  Filled 2021-04-12: qty 2

## 2021-04-12 MED ORDER — HYDRALAZINE HCL 20 MG/ML IJ SOLN: 10.0000 mg | Freq: Four times a day (QID) | INTRAMUSCULAR | Status: DC | PRN

## 2021-04-12 NOTE — TOC Initial Note (Signed)
Transition of Care [facility]) - Initial/Assessment Note    Patient Details  Name: Brad [name] MRN: [id number] Date of Birth: Oct 11, 1975  [facility] CM/SW Contact:    Liliana Cline, LCSW Phone Number: 04/12/2021, 3:13 PM  Clinical Narrative:       Spoke to patient for high risk screening. Patient is uninsured. Patient lives with his mother and fiance. Mother provides transportation. [facility]. Patient refuses SA resources at this time. Patient has no PCP. Is interested in using [facility]. Printed application and made referral in HUB. Patient denies additional needs.              Expected Discharge Plan: Home/Self Care Barriers to Discharge: Continued Medical Work up   Patient Goals and CMS Choice Patient states their goals for this hospitalization and ongoing recovery are:: to return home CMS Medicare.gov Compare Post Acute Care list provided to:: Patient Choice offered to / list presented to : Patient  Expected Discharge Plan and Services Expected Discharge Plan: Home/Self Care       Living arrangements for the past 2 months: Single Family Home                                      Prior Living Arrangements/Services Living arrangements for the past 2 months: Single Family Home Lives with:: Parents, Significant Other Patient language and need for interpreter reviewed:: Yes Do you feel safe going back to the place where you live?: Yes      Need for Family Participation in Patient Care: Yes (Comment) Care giver support system in place?: Yes (comment)   Criminal Activity/Legal Involvement Pertinent to Current Situation/Hospitalization: No - Comment as needed  Activities of Daily Living Home Assistive Devices/Equipment: Blood pressure cuff ADL Screening (condition at time of admission) Patient's cognitive ability adequate to safely complete daily activities?: Yes Is the patient deaf or have difficulty hearing?: No Does the  patient have difficulty seeing, even when wearing glasses/contacts?: No Does the patient have difficulty concentrating, remembering, or making decisions?: No Patient able to express need for assistance with ADLs?: Yes Does the patient have difficulty dressing or bathing?: No Independently performs ADLs?: Yes (appropriate for developmental age) Does the patient have difficulty walking or climbing stairs?: No Weakness of Legs: None Weakness of Arms/Hands: None  Permission Sought/Granted Permission sought to share information with : Facility Industrial/product designer granted to share information with : Yes, Verbal Permission Granted     Permission granted to share info w AGENCY: [facility]        Emotional Assessment       Orientation: : Oriented to Self, Oriented to Place, Oriented to  Time, Oriented to Situation Alcohol / Substance Use: Not Applicable Psych Involvement: No (comment)  Admission diagnosis:  Sweating increase [R61] Nausea [R11.0] Generalized abdominal pain [R10.84] Cardiac chest pain [R07.9] CHF exacerbation (HCC) [I50.9] Hypertensive emergency [I16.1] Systolic congestive heart failure, unspecified HF chronicity (HCC) [I50.20] Patient Active Problem List   Diagnosis Date Noted   Obesity, Class III, BMI 40-49.9 (morbid obesity) (HCC) 04/10/2021   Hypertensive emergency 04/10/2021   Acute on chronic diastolic CHF (congestive heart failure) (HCC) 02/15/2021   Hypertensive kidney disease with stage 3a chronic kidney disease (HCC) 02/15/2021   Type 2 diabetes mellitus with morbid obesity (HCC) 02/15/2021   Tobacco dependence 02/15/2021   Cocaine abuse in remission (HCC) 02/15/2021  Marijuana user 02/15/2021   Acute pulmonary edema (HCC)    Acute CHF (HCC) 01/12/2021   Asthma    Hypertension    Cocaine abuse (HCC) 04/17/2020   Pneumonia due to COVID-19 virus 04/16/2020   Acute hypoxemic respiratory failure due to COVID-19 (HCC) 04/16/2020    Benign essential HTN 04/16/2020   CKD (chronic kidney disease), stage IIIa 04/16/2020   Diverticulitis 10/06/2018   Hypertensive urgency 04/18/2016   Intractable nausea and vomiting 05/31/2015   PCP:  Patient, No Pcp Per (Inactive) [facility]acy:   [facility] [address] [place] [facility] [address] [place] [address] Phone: [phone] Fax: [phone]  [facility] [address] [place] [address] Phone: [phone] Fax: [phone]     Social Determinants of Health (SDOH) Interventions    Readmission Risk Interventions Readmission Risk Prevention Plan 04/12/2021  Transportation Screening Complete  PCP or Specialist Appt within 3-5 Days Complete  HRI or Home Care Consult Complete  Social Work Consult for Recovery Care Planning/Counseling Complete  Palliative Care Screening Complete  Medication Review Oceanographer) Complete  Some recent data might be hidden

## 2021-04-12 NOTE — Progress Notes (Addendum)
   Heart Failure Nurse Navigator Note  Met with patient today, he was sitting on the edge of the bed.  Again discussed his heart function  with the LVH.  Also talked about his tobacco abuse ,which he has done for a long time and states that he is not willing to give that up.  Also made him aware  his urine drug screen revealed  that cocaine, opiate and cannabinoid was detected.  He admits to daily marijuana use.  He denies any other drug use.  Explained these habits are not for his heart whatsoever a good  thing as he already has severe thickening of his left ventricle.  Again explained the importance of taking better care of himself, taking his medications as ordered, sticking with low-sodium diet and fluid restriction, monitoring his blood pressure,  maintaining overall good healthy habits.  Poor eye contact  maintained throughout visit.   He had no further questions.  Pricilla Riffle RN CHFN

## 2021-04-12 NOTE — Progress Notes (Signed)
PROGRESS NOTE    Brad [name]  [name]:[id number] DOB: 1976-04-03 DOA: 04/10/2021 PCP: Patient, No Pcp Per (Inactive)  243A/243A-AA   Assessment & Plan:   Principal Problem:   Hypertensive emergency Active Problems:   CKD (chronic kidney disease), stage IIIa   Asthma   Acute on chronic diastolic CHF (congestive heart failure) (HCC)   Tobacco dependence   Obesity, Class III, BMI 40-49.9 (morbid obesity) (HCC)   Brad [name] is a 45 y.o. male with medical history significant for Asthma, nicotine dependence, class III obesity, CKD stage IIIa, hypertension, hospitalized in May 2022 with acute heart failure related to uncontrolled HTN, with echo on 5/22 showing EF of 60 to 65% with grade 1 diastolic dysfunction and severe LVH, who presents to the ED with shortness of breath with minimal exertion, abdominal pain, nausea and diaphoresis.  He denies chest pain or lower extremity edema but endorses orthopnea stating he needs to sit up in a recliner to sleep.      Acute on chronic diastolic CHF (congestive heart failure)  -Patient with orthopnea and shortness of breath with exertion, BNP 127.7 and chest x-ray with interstitial thickening representing pulmonary edema - Echo from 5/22 showing EF 60 to 65%, grade 1 DD and LVH - started on IV Lasix Plan: --cont IV lasix 60 mg BID --Strict I/O    Hypertensive emergency --BP in ED 219/132.  Questionable compliance with home BP medication. --BP remained uncontrolled even after resuming home BP meds. Plan: --cont home coreg, Entresto --increase hydralazine to 100 mg q8h --cont diuresis  Mild trop elevation 2/2 demand ischemia - Troponin 30-38    CKD (chronic kidney disease), stage IIIa - Creatinine 1.89 which is at baseline --monitor Cr while diuresing     Asthma - albuterol neb PRN     Tobacco dependence - nicotine patch     Obesity, Class III, BMI 40-49.9 (morbid obesity) (HCC) - Complicating factor to overall prognosis and  care  Active and chronic cocaine use --likely the cause of pt's early CHF --cessation advised   DVT prophylaxis: Lovenox SQ Code Status: Full code  Family Communication: sister updated on the speaker phone today in front of pt Level of care: Progressive Cardiac Dispo:   The patient is from: home Anticipated d/c is to: home Anticipated d/c date is: tomorrow Patient currently is not medically ready to d/c due to: IV diuresis   Subjective and Interval History:  Pt reported breathing better.  Off O2.  Good urine output.  No N/V.   Objective: Vitals:   04/12/21 0810 04/12/21 1117 04/12/21 1227 04/12/21 1506  BP: (!) 164/94 (!) 153/127 (!) 160/123 (!) 173/100  Pulse: 68 63 74 76  Resp: 20 19  20   Temp: 97.8 F (36.6 C) 97.9 F (36.6 C)  98.2 F (36.8 C)  TempSrc:      SpO2: 96% 95%  92%  Weight:      Height:        Intake/Output Summary (Last 24 hours) at 04/12/2021 1815 Last data filed at 04/12/2021 1231 Gross per 24 hour  Intake 930 ml  Output 200 ml  Net 730 ml   Filed Weights   04/10/21 1822 04/11/21 2258 04/12/21 0500  Weight: (!) 139.3 kg (!) 136.4 kg (!) 136.4 kg    Examination:   Constitutional: NAD, AAOx3 HEENT: conjunctivae and lids normal, EOMI CV: No cyanosis.   RESP: normal respiratory effort, on RA Extremities: No effusions, edema in BLE SKIN: warm, dry Neuro: II -  XII grossly intact.   Psych: depressed mood and affect.  Appropriate judgement and reason   Data Reviewed: I have personally reviewed following labs and imaging studies  CBC: Recent Labs  Lab 04/10/21 1829 04/12/21 0509  WBC 9.1 7.0  HGB 15.4 15.3  HCT 46.5 47.1  MCV 80.7 84.0  PLT 306 261   Basic Metabolic Panel: Recent Labs  Lab 04/08/21 1530 04/10/21 1829 04/12/21 0509  NA 139 139 136  K 4.2 4.1 3.4*  CL 101 105 100  CO2 30 24 26   GLUCOSE 119* 146* 116*  BUN 33* 28* 28*  CREATININE 1.99* 1.89* 2.00*  CALCIUM 9.1 9.2 8.6*  MG  --   --  2.1   GFR: Estimated  Creatinine Clearance: 61.2 mL/min (A) (by C-G formula based on SCr of 2 mg/dL (H)). Liver Function Tests: Recent Labs  Lab 04/10/21 1829  AST 28  ALT 21  ALKPHOS 63  BILITOT 0.6  PROT 8.3*  ALBUMIN 4.3   Recent Labs  Lab 04/10/21 1829  LIPASE 28   No results for input(s): AMMONIA in the last 168 hours. Coagulation Profile: No results for input(s): INR, PROTIME in the last 168 hours. Cardiac Enzymes: No results for input(s): CKTOTAL, CKMB, CKMBINDEX, TROPONINI in the last 168 hours. BNP (last 3 results) No results for input(s): PROBNP in the last 8760 hours. HbA1C: No results for input(s): HGBA1C in the last 72 hours. CBG: No results for input(s): GLUCAP in the last 168 hours. Lipid Profile: No results for input(s): CHOL, HDL, LDLCALC, TRIG, CHOLHDL, LDLDIRECT in the last 72 hours. Thyroid Function Tests: No results for input(s): TSH, T4TOTAL, FREET4, T3FREE, THYROIDAB in the last 72 hours. Anemia Panel: No results for input(s): VITAMINB12, FOLATE, FERRITIN, TIBC, IRON, RETICCTPCT in the last 72 hours. Sepsis Labs: Recent Labs  Lab 04/10/21 1829 04/10/21 1934  LATICACIDVEN 1.2 1.3    Recent Results (from the past 240 hour(s))  Resp Panel by RT-PCR (Flu A&B, Covid) Nasopharyngeal Swab     Status: None   Collection Time: 04/10/21 10:30 PM   Specimen: Nasopharyngeal Swab; Nasopharyngeal(NP) swabs in vial transport medium  Result Value Ref Range Status   SARS Coronavirus 2 by RT PCR NEGATIVE NEGATIVE Final    Comment: (NOTE) SARS-CoV-2 target nucleic acids are NOT DETECTED.  The SARS-CoV-2 RNA is generally detectable in upper respiratory specimens during the acute phase of infection. The lowest concentration of SARS-CoV-2 viral copies this assay can detect is 138 copies/mL. A negative result does not preclude SARS-Cov-2 infection and should not be used as the sole basis for treatment or other patient management decisions. A negative result may occur with  improper  specimen collection/handling, submission of specimen other than nasopharyngeal swab, presence of viral mutation(s) within the areas targeted by this assay, and inadequate number of viral copies(<138 copies/mL). A negative result must be combined with clinical observations, patient history, and epidemiological information. The expected result is Negative.  Fact Sheet for Patients:  BloggerCourse.comhttps://www.fda.gov/media/152166/download  Fact Sheet for Healthcare Providers:  SeriousBroker.ithttps://www.fda.gov/media/152162/download  This test is no t yet approved or cleared by the [place] FDA and  has been authorized for detection and/or diagnosis of SARS-CoV-2 by FDA under an Emergency Use Authorization (EUA). This EUA will remain  in effect (meaning this test can be used) for the duration of the COVID-19 declaration under Section 564(b)(1) of the Act, 21 U.S.C.section 360bbb-3(b)(1), unless the authorization is terminated  or revoked sooner.       Influenza A by  PCR NEGATIVE NEGATIVE Final   Influenza B by PCR NEGATIVE NEGATIVE Final    Comment: (NOTE) The Xpert Xpress SARS-CoV-2/FLU/RSV plus assay is intended as an aid in the diagnosis of influenza from Nasopharyngeal swab specimens and should not be used as a sole basis for treatment. Nasal washings and aspirates are unacceptable for Xpert Xpress SARS-CoV-2/FLU/RSV testing.  Fact Sheet for Patients: BloggerCourse.com  Fact Sheet for Healthcare Providers: SeriousBroker.it  This test is not yet approved or cleared by the [place] FDA and has been authorized for detection and/or diagnosis of SARS-CoV-2 by FDA under an Emergency Use Authorization (EUA). This EUA will remain in effect (meaning this test can be used) for the duration of the COVID-19 declaration under Section 564(b)(1) of the Act, 21 U.S.C. section 360bbb-3(b)(1), unless the authorization is terminated or revoked.  Performed at  [facility], [address], [place], [place] [address]       Radiology Studies: [facility] Chest [place] 1 View  Result Date: 04/10/2021 CLINICAL DATA:  Cardiac chest pain. EXAM: PORTABLE CHEST 1 VIEW COMPARISON:  01/12/2021 FINDINGS: Cardiomegaly is stable. Unchanged mediastinal contours. There is mild interstitial thickening that may represent pulmonary edema, improved from prior exam. No pleural fluid. No pneumothorax. No confluent consolidation. No acute osseous abnormalities are seen. There is left shoulder arthropathy. IMPRESSION: Stable cardiomegaly. Mild interstitial thickening that may represent pulmonary edema, however is improved from exam earlier this day. Electronically Signed   By: Narda Rutherford M.D.   On: 04/10/2021 19:10   CT Angio Abd/Pel w/ and/or w/o  Result Date: 04/10/2021 CLINICAL DATA:  Pt states he has been nauseous and diaphoretic with HTN since this am. Diaphoretic. Nonlocalized acute abdominal pain. EXAM: CTA ABDOMEN AND PELVIS WITHOUT AND WITH CONTRAST TECHNIQUE: Multidetector CT imaging of the abdomen and pelvis was performed using the standard protocol during bolus administration of intravenous contrast. Multiplanar reconstructed images and MIPs were obtained and reviewed to evaluate the vascular anatomy. CONTRAST:  OMNIPAQUE IOHEXOL 350 MG/ML SOLN COMPARISON:  None. FINDINGS: VASCULAR Aorta: Mild atherosclerotic plaque. Normal caliber aorta without aneurysm, dissection, vasculitis or significant stenosis. Celiac: Patent without evidence of aneurysm, dissection, vasculitis or significant stenosis. SMA: Patent without evidence of aneurysm, dissection, vasculitis or significant stenosis. Renals: Both renal arteries are patent without evidence of aneurysm, dissection, vasculitis, fibromuscular dysplasia or significant stenosis. IMA: Patent without evidence of aneurysm, dissection, vasculitis or significant stenosis. Inflow: Mild atherosclerotic plaque. Patent without  evidence of aneurysm, dissection, vasculitis or significant stenosis. Proximal Outflow: Bilateral common femoral and visualized portions of the superficial and profunda femoral arteries are patent without evidence of aneurysm, dissection, vasculitis or significant stenosis. Veins: No obvious venous abnormality within the limitations of this arterial phase study. Review of the MIP images confirms the above findings. NON-VASCULAR Lower chest: Prominent left atrium.  No acute abnormality. Hepatobiliary: Enlarged liver. There is a 0.7 cm hyperdensity within the right hepatic lobe on arterial phase there is not visualized on the portal venous phase. Finding a seen previously on CT abdomen pelvis 12/05/2016. Otherwise no focal hepatic lesion. No gallstones, gallbladder wall thickening, or pericholecystic fluid. No biliary dilatation. Pancreas: No focal lesion. Normal pancreatic contour. No surrounding inflammatory changes. No main pancreatic ductal dilatation. Spleen: Normal in size without focal abnormality. Adrenals/Urinary Tract: No adrenal nodule bilaterally. Bilateral kidneys enhance symmetrically. No hydronephrosis. No hydroureter. The urinary bladder is unremarkable. Stomach/Bowel: Stomach is within normal limits. No evidence of bowel wall thickening or dilatation. Scattered colonic diverticulosis. Appendix appears normal. Lymphatic:  No lymphadenopathy. Reproductive: Prostate is unremarkable. Other: No intraperitoneal free fluid. No intraperitoneal free gas. No organized fluid collection. Musculoskeletal: No abdominal wall hernia or abnormality. No suspicious lytic or blastic osseous lesions. No acute displaced fracture. IMPRESSION: VASCULAR 1. No acute vascular abnormality. 2.  Aortic Atherosclerosis (ICD10-I70.0) - mild. NON-VASCULAR 1. Prominent left cardiac atrium. 2. Scattered colonic diverticulosis with no acute diverticulitis. 3. Hepatomegaly. Electronically Signed   By: Tish Frederickson M.D.   On: 04/10/2021  20:39     Scheduled Meds:  aspirin EC  81 mg Oral Daily   carvedilol  6.25 mg Oral BID WC   enoxaparin (LOVENOX) injection  0.5 mg/kg Subcutaneous Q2200   furosemide  60 mg Intravenous BID   hydrALAZINE  100 mg Oral Q8H   mouth rinse  15 mL Mouth Rinse BID   nicotine  21 mg Transdermal Daily   rosuvastatin  20 mg Oral Daily   sacubitril-valsartan  1 tablet Oral BID   Continuous Infusions:   LOS: 2 days     Darlin Priestly, MD [facility] If 7PM-7AM, please contact night-coverage 04/12/2021, 6:15 PM

## 2021-04-13 LAB — CBC
HCT: 46.3 % (ref 39.0–52.0)
Hemoglobin: 15.4 g/dL (ref 13.0–17.0)
MCH: 26.8 pg (ref 26.0–34.0)
MCHC: 33.3 g/dL (ref 30.0–36.0)
MCV: 80.5 fL (ref 80.0–100.0)
Platelets: 321 10*3/uL (ref 150–400)
RBC: 5.75 MIL/uL (ref 4.22–5.81)
RDW: 15.8 % — ABNORMAL HIGH (ref 11.5–15.5)
WBC: 8 10*3/uL (ref 4.0–10.5)
nRBC: 0 % (ref 0.0–0.2)

## 2021-04-13 LAB — BASIC METABOLIC PANEL
Anion gap: 12 (ref 5–15)
BUN: 31 mg/dL — ABNORMAL HIGH (ref 6–20)
CO2: 27 mmol/L (ref 22–32)
Calcium: 8.9 mg/dL (ref 8.9–10.3)
Chloride: 99 mmol/L (ref 98–111)
Creatinine, Ser: 2.06 mg/dL — ABNORMAL HIGH (ref 0.61–1.24)
GFR, Estimated: 40 mL/min — ABNORMAL LOW (ref >60)
Glucose, Bld: 122 mg/dL — ABNORMAL HIGH (ref 70–99)
Potassium: 3.7 mmol/L (ref 3.5–5.1)
Sodium: 138 mmol/L (ref 135–145)

## 2021-04-13 LAB — MAGNESIUM: Magnesium: 2.4 mg/dL (ref 1.7–2.4)

## 2021-04-13 MED ORDER — HYDRALAZINE HCL 100 MG PO TABS: 100.0000 mg | ORAL_TABLET | Freq: Three times a day (TID) | ORAL | 0 refills | Status: DC

## 2021-04-13 MED ORDER — FUROSEMIDE 40 MG PO TABS
40.0000 mg | ORAL_TABLET | Freq: Every day | ORAL | Status: DC
  Filled 2021-04-13: qty 7

## 2021-04-13 MED ORDER — CARVEDILOL 6.25 MG PO TABS
6.2500 mg | ORAL_TABLET | Freq: Two times a day (BID) | ORAL | Status: DC
  Filled 2021-04-13: qty 6

## 2021-04-13 MED ORDER — HYDRALAZINE HCL 50 MG PO TABS
100.0000 mg | ORAL_TABLET | Freq: Three times a day (TID) | ORAL | Status: DC
  Filled 2021-04-13: qty 18

## 2021-04-13 MED ORDER — HYDRALAZINE HCL 100 MG PO TABS
100.0000 mg | ORAL_TABLET | Freq: Three times a day (TID) | ORAL | 2 refills | Status: DC
  Filled 2021-04-13: qty 90, 30d supply, fill #0
  Filled 2021-05-08: qty 90, 30d supply, fill #1

## 2021-04-13 MED ORDER — SACUBITRIL-VALSARTAN 49-51 MG PO TABS: 1.0000 | ORAL_TABLET | Freq: Two times a day (BID) | ORAL | 0 refills | Status: AC

## 2021-04-13 MED ORDER — FUROSEMIDE 40 MG PO TABS: 40.0000 mg | ORAL_TABLET | Freq: Every day | ORAL | 0 refills | Status: DC

## 2021-04-13 MED ORDER — CARVEDILOL 6.25 MG PO TABS: 6.2500 mg | ORAL_TABLET | Freq: Two times a day (BID) | ORAL | 0 refills | Status: DC

## 2021-04-13 MED ORDER — SACUBITRIL-VALSARTAN 49-51 MG PO TABS
1.0000 | ORAL_TABLET | Freq: Two times a day (BID) | ORAL | Status: DC
  Filled 2021-04-13: qty 6

## 2021-04-13 NOTE — TOC Transition Note (Signed)
Transition of Care [facility]) - CM/SW Discharge Note   Patient Details  Name: Brad [name] MRN: [id number] Date of Birth: [birth year]  Transition of Care [facility]) CM/SW Contact:  Bing Quarry, RN Phone Number: 04/13/2021, 2:26 PM   Clinical Narrative:  Patient to be discharged today. Prior CM assessment/high risk assessment indicated patient refused SA information at this time. Patient does not have PCP and lives with mother and fiance. Mother provides transportation. [facility] information given. Gabriel Cirri RN CM     Final next level of care: Home/Self Care Barriers to Discharge: Barriers Resolved   Patient Goals and CMS Choice Patient states their goals for this hospitalization and ongoing recovery are:: to return home CMS Medicare.gov Compare Post Acute Care list provided to:: Patient Choice offered to / list presented to : Patient  Discharge Placement                       Discharge Plan and Services                DME Arranged: N/A DME Agency: NA         HH Agency: NA        Social Determinants of Health (SDOH) Interventions     Readmission Risk Interventions Readmission Risk Prevention Plan 04/12/2021  Transportation Screening Complete  PCP or Specialist Appt within 3-5 Days Complete  HRI or Home Care Consult Complete  Social Work Consult for Recovery Care Planning/Counseling Complete  Palliative Care Screening Complete  Medication Review [employer]) Complete  Some recent data might be hidden

## 2021-04-13 NOTE — Progress Notes (Signed)
Discharge instructions explained to pt/ verbalized an understanding/ iv and tele removed/ will transport off unit via wheelchair.  

## 2021-04-13 NOTE — Discharge Summary (Signed)
Physician Discharge Summary   Brad [name]  male DOB: 1975-09-28  YJE:[id number]  PCP: Patient, No Pcp Per (Inactive)  Admit date: 04/10/2021 Discharge date: 04/13/2021  Admitted From: home Disposition:  home CODE STATUS: Full code  Discharge Instructions     Discharge instructions   Complete by: As directed    As we discussed, your cocaine use and high blood pressure are causing your heart failure.    BP and heart failure medications had [facility] after your last hospitalization in May 2022.  Please pick them up on Monday, if you haven't already.  In the meantime, we are providing you with 3 days of Coreg, Entresto, hydralazine, and 7 days of Lasix for you to take home before you pick up your medications on Monday.   Dr. Darlin Priestly [facility] Course:  For full details, please see H&P, progress notes, consult notes and ancillary notes.  Briefly,  Brad [name] is a 45 y.o. male with medical history significant for Asthma, nicotine dependence, cocaine abuse, class III obesity, CKD stage IIIa, hypertension, who presented to the ED with shortness of breath with minimal exertion, abdominal pain, nausea and diaphoresis.  He denies chest pain or lower extremity edema but endorses orthopnea stating he needs to sit up in a recliner to sleep.    Pt was hospitalized in May 2022 with acute heart failure related to uncontrolled HTN, with echo on 5/22 showing EF of 60 to 65% with grade 1 diastolic dysfunction and severe LVH.    Acute on chronic diastolic CHF (congestive heart failure)  -Patient with orthopnea and shortness of breath with exertion, BNP 127.7 and chest x-ray with interstitial thickening representing pulmonary edema - Echo from 5/22 showing EF 60 to 65%, grade 1 DD and LVH. --pt presented with severely elevated BP, likely contributing to pulm edema. -pt was diuresed with IV lasix 60 mg BID with good urine output and  improvement in symptoms. --Pt was discharged on 7 days of oral Lasix 40 mg daily, and advised to be compliant with his BP meds.     Hypertensive emergency Medication non-compliance --BP in ED 219/132.  BP meds were prescribed to [facility] during May 2022 hospitalization, but records showed pt didn't pick up refills. --BP remained uncontrolled even after resuming coreg and Entresto, so hydralazine was added. --Pt was provided with 3 days of Coreg, Entresto, hydralazine, and 7 days of Lasix to take home with him and pt was advised to pick up his refills on Monday from [facility].   Mild trop elevation 2/2 demand ischemia - Troponin 30-38     CKD (chronic kidney disease), stage IIIa - Creatinine 1.89 which is at baseline     Asthma - albuterol neb PRN     Tobacco dependence - nicotine patch     Obesity, Class III, BMI 40-49.9 (morbid obesity) (HCC) - Complicating factor to overall prognosis and care   Active and chronic cocaine use --likely contributing to pt's early CHF and uncontrolled HTN --cessation advised   Discharge Diagnoses:  Principal Problem:   Hypertensive emergency Active Problems:   CKD (chronic kidney disease), stage IIIa   Asthma   Acute on chronic diastolic CHF (congestive heart failure) (HCC)   Tobacco dependence   Obesity, Class III, BMI 40-49.9 (morbid obesity) (HCC)   30 Day Unplanned Readmission Risk Score    Flowsheet Row ED to Hosp-Admission (Current) from 04/10/2021 in  [facility]  30 Day Unplanned Readmission Risk Score (%) 21.11 Filed at 04/13/2021 0800       This score is the patient's risk of an unplanned readmission within 30 days of being discharged (0 -100%). The score is based on dignosis, age, lab data, medications, orders, and past utilization.   Low:  0-14.9   Medium: 15-21.9   High: 22-29.9   Extreme: 30 and above         Discharge Instructions:  Allergies as of 04/13/2021   No Known Allergies       Medication List     TAKE these medications    albuterol 108 (90 Base) MCG/ACT inhaler Commonly known as: VENTOLIN HFA Inhale 2 puffs into the lungs every 4 (four) hours as needed for wheezing or shortness of breath.   aspirin 81 MG EC tablet Take 1 tablet (81 mg total) by mouth daily. Swallow whole.   carvedilol 6.25 MG tablet Commonly known as: COREG Take 1 tablet (6.25 mg total) by mouth 2 (two) times daily with a meal. What changed: Another medication with the same name was added. Make sure you understand how and when to take each.   carvedilol 6.25 MG tablet Commonly known as: Coreg Take 1 tablet (6.25 mg total) by mouth 2 (two) times daily for 3 days. Pick up the rest of the prescription from [facility]. What changed: You were already taking a medication with the same name, and this prescription was added. Make sure you understand how and when to take each.   furosemide 40 MG tablet Commonly known as: Lasix Take 1 tablet (40 mg total) by mouth daily for 7 days.   hydrALAZINE 100 MG tablet Commonly known as: APRESOLINE Take 1 tablet (100 mg total) by mouth 3 (three) times daily.   hydrALAZINE 100 MG tablet Commonly known as: APRESOLINE Take 1 tablet (100 mg total) by mouth 3 (three) times daily for 3 days. Pick up the rest of the prescription from [facility].   nicotine 21 mg/24hr patch Commonly known as: NICODERM CQ - dosed in mg/24 hours Place 1 patch (21 mg total) onto the skin daily.   rosuvastatin 20 MG tablet Commonly known as: CRESTOR Take 1 tablet (20 mg total) by mouth once daily.   sacubitril-valsartan 49-51 MG Commonly known as: ENTRESTO Take 1 tablet by mouth 2 (two) times daily. What changed: Another medication with the same name was added. Make sure you understand how and when to take each.   sacubitril-valsartan 49-51 MG Commonly known as: ENTRESTO Take 1 tablet by mouth 2 (two) times daily for 3 days. Pick up the  rest of the prescription from [facility]. What changed: You were already taking a medication with the same name, and this prescription was added. Make sure you understand how and when to take each.         Follow-up Information     Laurier Nancy, MD Follow up in 1 week(s).   Specialty: Cardiology Contact information: [address] [place] [address] [phone]                 No Known Allergies   The results of significant diagnostics from this hospitalization (including imaging, microbiology, ancillary and laboratory) are listed below for reference.   Consultations:   Procedures/Studies: DG Chest Port 1 View  Result Date: 04/10/2021 CLINICAL DATA:  Cardiac chest pain. EXAM: PORTABLE CHEST 1 VIEW COMPARISON:  01/12/2021 FINDINGS: Cardiomegaly is  stable. Unchanged mediastinal contours. There is mild interstitial thickening that may represent pulmonary edema, improved from prior exam. No pleural fluid. No pneumothorax. No confluent consolidation. No acute osseous abnormalities are seen. There is left shoulder arthropathy. IMPRESSION: Stable cardiomegaly. Mild interstitial thickening that may represent pulmonary edema, however is improved from exam earlier this day. Electronically Signed   By: Narda RutherfordMelanie  Sanford M.D.   On: 04/10/2021 19:10   CT Angio Abd/Pel w/ and/or w/o  Result Date: 04/10/2021 CLINICAL DATA:  Pt states he has been nauseous and diaphoretic with HTN since this am. Diaphoretic. Nonlocalized acute abdominal pain. EXAM: CTA ABDOMEN AND PELVIS WITHOUT AND WITH CONTRAST TECHNIQUE: Multidetector CT imaging of the abdomen and pelvis was performed using the standard protocol during bolus administration of intravenous contrast. Multiplanar reconstructed images and MIPs were obtained and reviewed to evaluate the vascular anatomy. CONTRAST:  100mL OMNIPAQUE IOHEXOL 350 MG/ML SOLN COMPARISON:  None. FINDINGS: VASCULAR Aorta: Mild atherosclerotic plaque.  Normal caliber aorta without aneurysm, dissection, vasculitis or significant stenosis. Celiac: Patent without evidence of aneurysm, dissection, vasculitis or significant stenosis. SMA: Patent without evidence of aneurysm, dissection, vasculitis or significant stenosis. Renals: Both renal arteries are patent without evidence of aneurysm, dissection, vasculitis, fibromuscular dysplasia or significant stenosis. IMA: Patent without evidence of aneurysm, dissection, vasculitis or significant stenosis. Inflow: Mild atherosclerotic plaque. Patent without evidence of aneurysm, dissection, vasculitis or significant stenosis. Proximal Outflow: Bilateral common femoral and visualized portions of the superficial and profunda femoral arteries are patent without evidence of aneurysm, dissection, vasculitis or significant stenosis. Veins: No obvious venous abnormality within the limitations of this arterial phase study. Review of the MIP images confirms the above findings. NON-VASCULAR Lower chest: Prominent left atrium.  No acute abnormality. Hepatobiliary: Enlarged liver. There is a 0.7 cm hyperdensity within the right hepatic lobe on arterial phase there is not visualized on the portal venous phase. Finding a seen previously on CT abdomen pelvis 12/05/2016. Otherwise no focal hepatic lesion. No gallstones, gallbladder wall thickening, or pericholecystic fluid. No biliary dilatation. Pancreas: No focal lesion. Normal pancreatic contour. No surrounding inflammatory changes. No main pancreatic ductal dilatation. Spleen: Normal in size without focal abnormality. Adrenals/Urinary Tract: No adrenal nodule bilaterally. Bilateral kidneys enhance symmetrically. No hydronephrosis. No hydroureter. The urinary bladder is unremarkable. Stomach/Bowel: Stomach is within normal limits. No evidence of bowel wall thickening or dilatation. Scattered colonic diverticulosis. Appendix appears normal. Lymphatic: No lymphadenopathy. Reproductive:  Prostate is unremarkable. Other: No intraperitoneal free fluid. No intraperitoneal free gas. No organized fluid collection. Musculoskeletal: No abdominal wall hernia or abnormality. No suspicious lytic or blastic osseous lesions. No acute displaced fracture. IMPRESSION: VASCULAR 1. No acute vascular abnormality. 2.  Aortic Atherosclerosis (ICD10-I70.0) - mild. NON-VASCULAR 1. Prominent left cardiac atrium. 2. Scattered colonic diverticulosis with no acute diverticulitis. 3. Hepatomegaly. Electronically Signed   By: Tish FredericksonMorgane  Naveau M.D.   On: 04/10/2021 20:39      Labs: BNP (last 3 results) Recent Labs    01/12/21 0629 04/08/21 1530 04/10/21 1829  BNP 776.9* 159.9* 127.7*   Basic Metabolic Panel: Recent Labs  Lab 04/08/21 1530 04/10/21 1829 04/12/21 0509 04/13/21 0445  NA 139 139 136 138  K 4.2 4.1 3.4* 3.7  CL 101 105 100 99  CO2 30 24 26 27   GLUCOSE 119* 146* 116* 122*  BUN 33* 28* 28* 31*  CREATININE 1.99* 1.89* 2.00* 2.06*  CALCIUM 9.1 9.2 8.6* 8.9  MG  --   --  2.1 2.4   Liver Function Tests: Recent  Labs  Lab 04/10/21 1829  AST 28  ALT 21  ALKPHOS 63  BILITOT 0.6  PROT 8.3*  ALBUMIN 4.3   Recent Labs  Lab 04/10/21 1829  LIPASE 28   No results for input(s): AMMONIA in the last 168 hours. CBC: Recent Labs  Lab 04/10/21 1829 04/12/21 0509 04/13/21 0445  WBC 9.1 7.0 8.0  HGB 15.4 15.3 15.4  HCT 46.5 47.1 46.3  MCV 80.7 84.0 80.5  PLT 306 261 321   Cardiac Enzymes: No results for input(s): CKTOTAL, CKMB, CKMBINDEX, TROPONINI in the last 168 hours. BNP: Invalid input(s): POCBNP CBG: No results for input(s): GLUCAP in the last 168 hours. D-Dimer No results for input(s): DDIMER in the last 72 hours. Hgb A1c No results for input(s): HGBA1C in the last 72 hours. Lipid Profile No results for input(s): CHOL, HDL, LDLCALC, TRIG, CHOLHDL, LDLDIRECT in the last 72 hours. Thyroid function studies No results for input(s): TSH, T4TOTAL, T3FREE, THYROIDAB in  the last 72 hours.  Invalid input(s): FREET3 Anemia work up No results for input(s): VITAMINB12, FOLATE, FERRITIN, TIBC, IRON, RETICCTPCT in the last 72 hours. Urinalysis    Component Value Date/Time   COLORURINE YELLOW (A) 04/10/2021 1921   APPEARANCEUR CLEAR (A) 04/10/2021 1921   APPEARANCEUR Clear 03/23/2012 1704   LABSPEC 1.037 (H) 04/10/2021 1921   LABSPEC 1.021 03/23/2012 1704   PHURINE 7.0 04/10/2021 1921   GLUCOSEU 50 (A) 04/10/2021 1921   GLUCOSEU Negative 03/23/2012 1704   HGBUR NEGATIVE 04/10/2021 1921   BILIRUBINUR NEGATIVE 04/10/2021 1921   BILIRUBINUR Negative 03/23/2012 1704   KETONESUR NEGATIVE 04/10/2021 1921   PROTEINUR >=300 (A) 04/10/2021 1921   NITRITE NEGATIVE 04/10/2021 1921   LEUKOCYTESUR NEGATIVE 04/10/2021 1921   LEUKOCYTESUR Negative 03/23/2012 1704   Sepsis Labs Invalid input(s): PROCALCITONIN,  WBC,  LACTICIDVEN Microbiology Recent Results (from the past 240 hour(s))  Resp Panel by RT-PCR (Flu A&B, Covid) Nasopharyngeal Swab     Status: None   Collection Time: 04/10/21 10:30 PM   Specimen: Nasopharyngeal Swab; Nasopharyngeal(NP) swabs in vial transport medium  Result Value Ref Range Status   SARS Coronavirus 2 by RT PCR NEGATIVE NEGATIVE Final    Comment: (NOTE) SARS-CoV-2 target nucleic acids are NOT DETECTED.  The SARS-CoV-2 RNA is generally detectable in upper respiratory specimens during the acute phase of infection. The lowest concentration of SARS-CoV-2 viral copies this assay can detect is 138 copies/mL. A negative result does not preclude SARS-Cov-2 infection and should not be used as the sole basis for treatment or other patient management decisions. A negative result may occur with  improper specimen collection/handling, submission of specimen other than nasopharyngeal swab, presence of viral mutation(s) within the areas targeted by this assay, and inadequate number of viral copies(<138 copies/mL). A negative result must be combined  with clinical observations, patient history, and epidemiological information. The expected result is Negative.  Fact Sheet for Patients:  BloggerCourse.com  Fact Sheet for Healthcare Providers:  SeriousBroker.it  This test is no t yet approved or cleared by the [place] FDA and  has been authorized for detection and/or diagnosis of SARS-CoV-2 by FDA under an Emergency Use Authorization (EUA). This EUA will remain  in effect (meaning this test can be used) for the duration of the COVID-19 declaration under Section 564(b)(1) of the Act, 21 U.S.C.section 360bbb-3(b)(1), unless the authorization is terminated  or revoked sooner.       Influenza A by PCR NEGATIVE NEGATIVE Final   Influenza B by PCR NEGATIVE  NEGATIVE Final    Comment: (NOTE) The Xpert Xpress SARS-CoV-2/FLU/RSV plus assay is intended as an aid in the diagnosis of influenza from Nasopharyngeal swab specimens and should not be used as a sole basis for treatment. Nasal washings and aspirates are unacceptable for Xpert Xpress SARS-CoV-2/FLU/RSV testing.  Fact Sheet for Patients: BloggerCourse.com  Fact Sheet for Healthcare Providers: SeriousBroker.it  This test is not yet approved or cleared by the [place] FDA and has been authorized for detection and/or diagnosis of SARS-CoV-2 by FDA under an Emergency Use Authorization (EUA). This EUA will remain in effect (meaning this test can be used) for the duration of the COVID-19 declaration under Section 564(b)(1) of the Act, 21 U.S.C. section 360bbb-3(b)(1), unless the authorization is terminated or revoked.  Performed at [facility], [address] [place], [place] [address]      Total time spend on discharging this patient, including the last patient exam, discussing the hospital stay, instructions for ongoing care as it relates to all pertinent  caregivers, as well as preparing the medical discharge records, prescriptions, and/or referrals as applicable, is 50 minutes.    Darlin Priestly, MD  [facility] 04/13/2021, 10:08 AM

## 2021-04-15 ENCOUNTER — Other Ambulatory Visit: Payer: Self-pay

## 2021-04-17 ENCOUNTER — Other Ambulatory Visit: Payer: Self-pay

## 2021-04-17 ENCOUNTER — Ambulatory Visit: Payer: Self-pay | Admitting: Family

## 2021-04-19 ENCOUNTER — Ambulatory Visit: Payer: Self-pay | Admitting: Family

## 2021-04-22 ENCOUNTER — Ambulatory Visit: Payer: Self-pay | Admitting: Internal Medicine

## 2021-04-29 NOTE — Progress Notes (Signed)
Patient ID: Brad [name], male    DOB: 12-19-1975, 45 y.o.   MRN: [id number]  HPI  Brad [name] is a 45 y/o male with a history of HTN, tobacco use, substance abuse and chronic heart failure.   Echo report from 01/13/21 reviewed and showed an EF of 60-65% along with severe LVH.   Admitted 04/10/21 due to shortness of breath and GI symptoms. Found to be hypertensive. Initially treated with IV lasix and then transitioned to oral diuretics. Troponin elevation thought to be due to demand ischemia. + cocaine use. Discharged after 3 days. Admitted 01/12/21 due to shortness of breath. Cardiology consult obtained. UDS + for cocaine. Initially given IV lasix with transition to oral diurets. Discharged the following day. Was in the ED 01/03/21 due to shortness where he was treated and released.   He presents today for a follow-up visit with a chief complaint of moderate fatigue with little exertion. He describes this as chronic in nature having been present for several years. He has associated shortness of breath, apnea and difficulty sleeping along with this. He denies any dizziness, abdominal distention, palpitations, pedal edema, chest pain, cough or weight gain.   He says that he's no longer working at [employer] and is working at [employer]. He says that there's a lot of walking involved and physical labor that he doesn't feel he can do. Says that he gets quite short of breath while doing the job and is considering filing for disability.   Did not bring his medications and is very unclear as to what he is taking.   Past Medical History:  Diagnosis Date   Asthma    CHF (congestive heart failure) (HCC)    Diverticulitis    Hypertension    Past Surgical History:  Procedure Laterality Date   ANKLE SURGERY     Family History  Problem Relation Age of Onset   Hypertension Mother    Social History   Tobacco Use   Smoking status: Every Day    Packs/day: 0.50    Types: Cigarettes   Smokeless  tobacco: Never  Substance Use Topics   Alcohol use: Yes    Comment: occas   No Known Allergies  Prior to Admission medications   Medication Sig Start Date End Date Taking? Authorizing Provider  albuterol (VENTOLIN HFA) 108 (90 Base) MCG/ACT inhaler Inhale 2 puffs into the lungs every 4 (four) hours as needed for wheezing or shortness of breath. 01/13/21  Yes Arnetha Courser, MD  aspirin EC 81 MG EC tablet Take 1 tablet (81 mg total) by mouth daily. Swallow whole. 01/14/21  Yes Arnetha Courser, MD  carvedilol (COREG) 6.25 MG tablet Take 1 tablet (6.25 mg total) by mouth 2 (two) times daily for 3 days. Pick up the rest of the prescription from [facility]. 04/13/21  Yes Darlin Priestly, MD  hydrALAZINE (APRESOLINE) 100 MG tablet Take 1 tablet (100 mg total) by mouth 3 (three) times daily. 04/13/21 07/12/21 Yes Darlin Priestly, MD  rosuvastatin (CRESTOR) 20 MG tablet Take 1 tablet (20 mg total) by mouth once daily. 02/18/21 02/18/22 Yes Delma Freeze, FNP  carvedilol (COREG) 6.25 MG tablet Take 1 tablet (6.25 mg total) by mouth 2 (two) times daily with a meal. 03/18/21   Brad Matar, MD  furosemide (LASIX) 40 MG tablet Take 1 tablet (40 mg total) by mouth daily for 7 days. 04/13/21 04/20/21  Darlin Priestly, MD  hydrALAZINE (APRESOLINE) 100 MG tablet Take 1 tablet (100  mg total) by mouth 3 (three) times daily for 3 days. Pick up the rest of the prescription from [facility]. 04/13/21 04/16/21  Darlin Priestly, MD  nicotine (NICODERM CQ - DOSED IN MG/24 HOURS) 21 mg/24hr patch Place 1 patch (21 mg total) onto the skin daily. Patient not taking: Reported on 04/30/2021 04/08/21   Delma Freeze, FNP  sacubitril-valsartan (ENTRESTO) 49-51 MG Take 1 tablet by mouth 2 (two) times daily. 04/08/21   Delma Freeze, FNP    Review of Systems  Constitutional:  Positive for fatigue (easily). Negative for appetite change.  HENT:  Negative for congestion, postnasal drip and sore throat.   Eyes: Negative.    Respiratory:  Positive for apnea and shortness of breath (easily). Negative for cough and chest tightness.        + snoring  Cardiovascular:  Negative for chest pain, palpitations and leg swelling.  Gastrointestinal:  Negative for abdominal distention and abdominal pain.  Endocrine: Negative.   Genitourinary: Negative.   Musculoskeletal:  Positive for arthralgias (right shoulder). Negative for back pain and neck pain.  Skin: Negative.   Allergic/Immunologic: Negative.   Neurological:  Negative for dizziness and light-headedness.  Hematological:  Negative for adenopathy. Does not bruise/bleed easily.  Psychiatric/Behavioral:  Positive for sleep disturbance (sleeping on 1 pillow). Negative for dysphoric mood. The patient is not nervous/anxious.    Vitals:   04/30/21 0845 04/30/21 0853  BP: (!) 147/101 (!) 151/89  Pulse: 67   Resp: 18   SpO2: 98%   Weight: (!) 300 lb 4 oz (136.2 kg)   Height: 5\' 6"  (1.676 m)    Wt Readings from Last 3 Encounters:  04/30/21 (!) 300 lb 4 oz (136.2 kg)  04/12/21 (!) 300 lb 9.6 oz (136.4 kg)  04/08/21 (!) 307 lb 8 oz (139.5 kg)   Lab Results  Component Value Date   CREATININE 2.06 (H) 04/13/2021   CREATININE 2.00 (H) 04/12/2021   CREATININE 1.89 (H) 04/10/2021     Physical Exam Vitals and nursing note reviewed.  Constitutional:      Appearance: Normal appearance.  HENT:     Head: Normocephalic and atraumatic.  Cardiovascular:     Rate and Rhythm: Normal rate and regular rhythm.  Pulmonary:     Effort: Pulmonary effort is normal. No respiratory distress.     Breath sounds: No wheezing or rales.  Abdominal:     General: There is no distension.     Palpations: Abdomen is soft.     Tenderness: There is no abdominal tenderness.  Musculoskeletal:        General: No tenderness.     Cervical back: Normal range of motion and neck supple.     Right lower leg: No edema.     Left lower leg: No edema.  Skin:    General: Skin is warm and dry.   Neurological:     General: No focal deficit present.     Mental Status: He is alert and oriented to person, place, and time.  Psychiatric:        Mood and Affect: Mood normal.        Behavior: Behavior normal.        Thought Content: Thought content normal.    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)- - NYHA class III - euvolemic today - weighing daily; reminded to call for an overnight weight gain of > 2 pounds or a weekly  weight gain of > 5 pounds - weight down 7 pounds from last visit here 3 weeks ago - does not add salt to his food; reviewed the importance of reading food labels for sodium content - did not show for cardiology appt with Dr. Welton Flakes on 02/04/21 - he's unsure of his fluid intake; explained that he needed to drink ~ 60 ounces of fluid daily - did not bring his medications and he's completely unsure of what he's taking; RN called his mom who he says manages his meds and she wasn't home so she was also unsure of what he was taking - BNP 04/10/21 was 127.7  2: HTN with CKD- - BP initially elevated (147/101) but had improved somewhat after recheck (151/89) - had telemedicine visit with PCP Brad [name]) 03/18/21 - BMP 04/13/21 reviewed and showed sodium 138, potassium 3.7, creatinine 2.06 and GFR 40  3: Tobacco use- - no longer smoking - drinks alcohol on social occasions only (birthday/ holiday) - uses marijuana - denies any recent cocaine  4: Snoring- - patient continues to endorse snoring - patient says that he wakes himself up which then scares him and he's finding it stressful to now go to sleep because of this fear - needs sleep study but will defer to PCP due to patient currently being uninsured and will need financial assistance   Patient did not bring his medications nor a list. Emphasized the importance of bringing his medication bottles to every visit every time.   Return in 1 month or sooner  for any questions/problems before then.

## 2021-04-30 ENCOUNTER — Encounter: Payer: Self-pay | Admitting: Family

## 2021-04-30 ENCOUNTER — Other Ambulatory Visit: Payer: Self-pay

## 2021-04-30 ENCOUNTER — Ambulatory Visit: Payer: Self-pay | Attending: Family | Admitting: Family

## 2021-04-30 VITALS — BP 151/89 | HR 67 | Resp 18 | Ht 66.0 in | Wt 300.2 lb

## 2021-04-30 DIAGNOSIS — Z7982 Long term (current) use of aspirin: Secondary | ICD-10-CM | POA: Insufficient documentation

## 2021-04-30 DIAGNOSIS — N189 Chronic kidney disease, unspecified: Secondary | ICD-10-CM | POA: Insufficient documentation

## 2021-04-30 DIAGNOSIS — Z8249 Family history of ischemic heart disease and other diseases of the circulatory system: Secondary | ICD-10-CM | POA: Insufficient documentation

## 2021-04-30 DIAGNOSIS — F1290 Cannabis use, unspecified, uncomplicated: Secondary | ICD-10-CM | POA: Insufficient documentation

## 2021-04-30 DIAGNOSIS — J45909 Unspecified asthma, uncomplicated: Secondary | ICD-10-CM | POA: Insufficient documentation

## 2021-04-30 DIAGNOSIS — Z87891 Personal history of nicotine dependence: Secondary | ICD-10-CM | POA: Insufficient documentation

## 2021-04-30 DIAGNOSIS — R0683 Snoring: Secondary | ICD-10-CM | POA: Insufficient documentation

## 2021-04-30 DIAGNOSIS — I130 Hypertensive heart and chronic kidney disease with heart failure and stage 1 through stage 4 chronic kidney disease, or unspecified chronic kidney disease: Secondary | ICD-10-CM | POA: Insufficient documentation

## 2021-04-30 DIAGNOSIS — Z720 Tobacco use: Secondary | ICD-10-CM

## 2021-04-30 DIAGNOSIS — I5032 Chronic diastolic (congestive) heart failure: Secondary | ICD-10-CM | POA: Insufficient documentation

## 2021-04-30 DIAGNOSIS — Z79899 Other long term (current) drug therapy: Secondary | ICD-10-CM | POA: Insufficient documentation

## 2021-04-30 DIAGNOSIS — I10 Essential (primary) hypertension: Secondary | ICD-10-CM

## 2021-04-30 NOTE — Patient Instructions (Addendum)
Continue weighing daily and call for an overnight weight gain of > 2 pounds or a weekly weight gain of >5 pounds.   Bring ALL medications including any over the counter or vitamins to every visit.   

## 2021-05-08 ENCOUNTER — Other Ambulatory Visit: Payer: Self-pay

## 2021-05-08 ENCOUNTER — Ambulatory Visit: Payer: Self-pay | Admitting: Pharmacy Technician

## 2021-05-08 DIAGNOSIS — Z79899 Other long term (current) drug therapy: Secondary | ICD-10-CM

## 2021-05-08 NOTE — Progress Notes (Signed)
Completed [facility] application and contract.  Patient agreed to all terms of the [facility] contract.    Patient approved to receive medication assistance at [facility] until time for re-certification in 2585 and as long as eligibility criteria continues to be met.    Provided patient with Civil engineer, contracting based on his particular needs.    [facility]

## 2021-05-09 ENCOUNTER — Other Ambulatory Visit: Payer: Self-pay | Admitting: Family

## 2021-05-09 ENCOUNTER — Telehealth: Payer: Self-pay | Admitting: Gerontology

## 2021-05-09 ENCOUNTER — Other Ambulatory Visit: Payer: Self-pay

## 2021-05-09 DIAGNOSIS — I129 Hypertensive chronic kidney disease with stage 1 through stage 4 chronic kidney disease, or unspecified chronic kidney disease: Secondary | ICD-10-CM

## 2021-05-09 DIAGNOSIS — N1831 Chronic kidney disease, stage 3a: Secondary | ICD-10-CM

## 2021-05-09 MED ORDER — CARVEDILOL 6.25 MG PO TABS
6.2500 mg | ORAL_TABLET | Freq: Two times a day (BID) | ORAL | 3 refills | Status: DC
  Filled 2021-05-09 – 2021-05-29 (×2): qty 180, 90d supply, fill #0

## 2021-05-09 MED ORDER — ROSUVASTATIN CALCIUM 20 MG PO TABS
20.0000 mg | ORAL_TABLET | Freq: Every day | ORAL | 3 refills | Status: DC
  Filled 2021-05-09: qty 90, 90d supply, fill #0

## 2021-05-09 MED ORDER — HYDRALAZINE HCL 100 MG PO TABS
100.0000 mg | ORAL_TABLET | Freq: Three times a day (TID) | ORAL | 3 refills | Status: DC
  Filled 2021-05-09: qty 270, 90d supply, fill #0

## 2021-05-09 MED ORDER — FUROSEMIDE 40 MG PO TABS
40.0000 mg | ORAL_TABLET | Freq: Every day | ORAL | 3 refills | Status: DC
  Filled 2021-05-09 – 2021-05-22 (×2): qty 90, 90d supply, fill #0

## 2021-05-09 NOTE — Telephone Encounter (Signed)
Called twice, but it did not go through.

## 2021-05-09 NOTE — Progress Notes (Signed)
Sent refills to med management clinic for:  Carvedilol, hydralazine, rosuvastatin and furosemide

## 2021-05-10 ENCOUNTER — Encounter: Payer: Self-pay | Admitting: Internal Medicine

## 2021-05-10 ENCOUNTER — Ambulatory Visit: Payer: Self-pay | Attending: Internal Medicine | Admitting: Internal Medicine

## 2021-05-10 ENCOUNTER — Other Ambulatory Visit: Payer: Self-pay

## 2021-05-10 VITALS — BP 164/94 | HR 67 | Resp 16 | Wt 295.4 lb

## 2021-05-10 DIAGNOSIS — F1910 Other psychoactive substance abuse, uncomplicated: Secondary | ICD-10-CM

## 2021-05-10 DIAGNOSIS — N1832 Chronic kidney disease, stage 3b: Secondary | ICD-10-CM

## 2021-05-10 DIAGNOSIS — M25571 Pain in right ankle and joints of right foot: Secondary | ICD-10-CM

## 2021-05-10 DIAGNOSIS — G8929 Other chronic pain: Secondary | ICD-10-CM | POA: Insufficient documentation

## 2021-05-10 DIAGNOSIS — I10 Essential (primary) hypertension: Secondary | ICD-10-CM

## 2021-05-10 DIAGNOSIS — Z09 Encounter for follow-up examination after completed treatment for conditions other than malignant neoplasm: Secondary | ICD-10-CM

## 2021-05-10 DIAGNOSIS — I5032 Chronic diastolic (congestive) heart failure: Secondary | ICD-10-CM

## 2021-05-10 DIAGNOSIS — Z2821 Immunization not carried out because of patient refusal: Secondary | ICD-10-CM

## 2021-05-10 MED ORDER — ISOSORBIDE MONONITRATE ER 30 MG PO TB24
30.0000 mg | ORAL_TABLET | Freq: Every day | ORAL | 2 refills | Status: DC
Start: 2021-05-10 — End: 2021-06-10

## 2021-05-10 NOTE — Patient Instructions (Signed)
We have added a medication called isosorbide to try to get your blood pressure better.  You should take it once a day.  I strongly encourage you to discontinue the use of cocaine and marijuana.  Please apply for the orange card/cone discount card.  Once approved we can refer you for your sleep study and to see an orthopedic specialist for your right ankle.

## 2021-05-10 NOTE — Progress Notes (Signed)
Patient ID: Brad [name]., male    DOB: 1975-09-24  MRN: [id number]  CC: Hospitalization Follow-up   Subjective: Brad [name] is a 45 y.o. male who presents for hosp f/u His concerns today include:  Patient with history of DM, HTN, CKD 3, HL, diastolic CHF (EF 60 to 65% 12/2020), tob dep, polysubst use (cocaine,marijuana), diverticulosis.  Patient hospitalized 8/17-20/2022 with hypertensive emergency and acute on chronic diastolic CHF.  He was diuresed with improvement in his symptoms.  He had mild troponin elevation.  He was discharged on carvedilol, Entresto, hydralazine and furosemide.  Creatinine at the time of discharge was 1.89.  Urine drug screen positive for marijuana, cocaine and opiates.  Today: CHF/HTN/CKD: Patient reports compliance with his medications.  He has his medication bottles with him today.  Reportedly gets his medications through a free clinic in [place] where he lives.  He sees a Publishing rights manager in the heart failure clinic in [place].  Saw her yesterday. -weight is down 5 pounds since hospital discharge.  He limits salt in the foods. -No PND or orthopnea at this time.  Sleeps on 2 pillows.  Still snores very loud.  Advised to apply for the orange card on last visit with me so that we could refer for sleep study.  He has not done so as yet. -His BP device no longer works. -Reports that he snorts cocaine every now and then.  Still uses marijuana. -Wanting to apply for disability.  Reports shortness of breath and decreased energy.  Used to work for [employer] but had to quit as the heat made his breathing worse.  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  Gets too winded to do that.  Also has a steel plate states in the right ankle from fracture sustained in the early 2000's.  Ankle gives out sometimes and gets stiff when he does a lot of walking. Patient Active Problem List   Diagnosis Date Noted   Obesity, Class III, BMI  40-49.9 (morbid obesity) (HCC) 04/10/2021   Hypertensive emergency 04/10/2021   Acute on chronic diastolic CHF (congestive heart failure) (HCC) 02/15/2021   Hypertensive kidney disease with stage 3a chronic kidney disease (HCC) 02/15/2021   Type 2 diabetes mellitus with morbid obesity (HCC) 02/15/2021   Tobacco dependence 02/15/2021   Cocaine abuse in remission (HCC) 02/15/2021   Marijuana user 02/15/2021   Acute pulmonary edema (HCC)    Acute CHF (HCC) 01/12/2021   Asthma    Hypertension    Cocaine abuse (HCC) 04/17/2020   Pneumonia due to COVID-19 virus 04/16/2020   Acute hypoxemic respiratory failure due to COVID-19 (HCC) 04/16/2020   Benign essential HTN 04/16/2020   CKD (chronic kidney disease), stage IIIa 04/16/2020   Diverticulitis 10/06/2018   Hypertensive urgency 04/18/2016   Intractable nausea and vomiting 05/31/2015     Current Outpatient Medications on File Prior to Visit  Medication Sig Dispense Refill   albuterol (VENTOLIN HFA) 108 (90 Base) MCG/ACT inhaler Inhale 2 puffs into the lungs every 4 (four) hours as needed for wheezing or shortness of breath. 8 g 1   aspirin EC 81 MG EC tablet Take 1 tablet (81 mg total) by mouth daily. Swallow whole. 30 tablet 11   carvedilol (COREG) 6.25 MG tablet Take 1 tablet (6.25 mg total) by mouth 2 (two) times daily with a meal. 180 tablet 3   furosemide (LASIX) 40 MG tablet Take 1 tablet (40 mg total) by mouth  once daily. 90 tablet 3   hydrALAZINE (APRESOLINE) 100 MG tablet Take 1 tablet (100 mg total) by mouth 3 (three) times daily. 270 tablet 3   nicotine (NICODERM CQ - DOSED IN MG/24 HOURS) 21 mg/24hr patch Place 1 patch (21 mg total) onto the skin daily. 90 patch 3   rosuvastatin (CRESTOR) 20 MG tablet Take 1 tablet (20 mg total) by mouth once daily. 90 tablet 3   sacubitril-valsartan (ENTRESTO) 49-51 MG Take 1 tablet by mouth 2 (two) times daily. 180 tablet 3   No current facility-administered medications on file prior to visit.     No Known Allergies  Social History   Socioeconomic History   Marital status: Single    Spouse name: Not on file   Number of children: Not on file   Years of education: Not on file   Highest education level: Not on file  Occupational History   Not on file  Tobacco Use   Smoking status: Every Day    Packs/day: 0.50    Types: Cigarettes   Smokeless tobacco: Never  Substance and Sexual Activity   Alcohol use: Yes    Comment: occas   Drug use: Yes    Types: Marijuana    Comment: daily   Sexual activity: Not on file  Other Topics Concern   Not on file  Social History Narrative   Not on file   Social Determinants of Health   Financial Resource Strain: Not on file  Food Insecurity: Not on file  Transportation Needs: Not on file  Physical Activity: Not on file  Stress: Not on file  Social Connections: Not on file  Intimate Partner Violence: Not on file    Family History  Problem Relation Age of Onset   Hypertension Mother     Past Surgical History:  Procedure Laterality Date   ANKLE SURGERY      ROS: Review of Systems Negative except as stated above  PHYSICAL EXAM: BP (!) 164/94   Pulse 67   Resp 16   Wt 295 lb 6.4 oz (134 kg)   SpO2 95%   BMI 47.68 kg/m   Wt Readings from Last 3 Encounters:  05/10/21 295 lb 6.4 oz (134 kg)  04/30/21 (!) 300 lb 4 oz (136.2 kg)  04/12/21 (!) 300 lb 9.6 oz (136.4 kg)   Physical Exam  General appearance - alert, well appearing, obese middle-age African-American male and in no distress Mental status - normal mood, behavior, speech, dress, motor activity, and thought processes Neck - supple, no significant adenopathy Chest - clear to auscultation, no wheezes, rales or rhonchi, symmetric air entry Heart - normal rate, regular rhythm, normal S1, S2, no murmurs, rubs, clicks or gallops Extremities - peripheral pulses normal, no pedal edema, no clubbing or cyanosis MSK: Right ankle: Mild bony enlargement laterally.  No  point tenderness. Depression screen [facility] 2/9 05/10/2021 04/30/2021  Decreased Interest 3 0  Down, Depressed, Hopeless 2 1  PHQ - 2 Score 5 1  Altered sleeping 3 -  Tired, decreased energy 3 -  Change in appetite 2 -  Trouble concentrating 0 -  Moving slowly or fidgety/restless 0 -  Suicidal thoughts 0 -  PHQ-9 Score 13 -    CMP Latest Ref Rng & Units 04/13/2021 04/12/2021 04/10/2021  Glucose 70 - 99 mg/dL 315(Q) 008(Q) 761(P)  BUN 6 - 20 mg/dL 50(D) 32(I) 71(I)  Creatinine 0.61 - 1.24 mg/dL 4.58(K) 9.98(P) 3.82(N)  Sodium 135 - 145 mmol/L 138 136  139  Potassium 3.5 - 5.1 mmol/L 3.7 3.4(L) 4.1  Chloride 98 - 111 mmol/L 99 100 105  CO2 22 - 32 mmol/L 27 26 24   Calcium 8.9 - 10.3 mg/dL 8.9 ) 9.2  Total Protein 6.5 - 8.1 g/dL - - 8.3(H)  Total Bilirubin 0.3 - 1.2 mg/dL - - 0.6  Alkaline Phos 38 - 126 U/L - - 63  AST 15 - 41 U/L - - 28  ALT 0 - 44 U/L - - 21   Lipid Panel     Component Value Date/Time   CHOL 253 (H) 01/13/2021 0614   TRIG 164 (H) 01/13/2021 0614   HDL 51 01/13/2021 0614   CHOLHDL 5.0 01/13/2021 0614   VLDL 33 01/13/2021 0614   LDLCALC 169 (H) 01/13/2021 0614    CBC    Component Value Date/Time   WBC 8.0 04/13/2021 0445   RBC 5.75 04/13/2021 0445   HGB 15.4 04/13/2021 0445   HCT 46.3 04/13/2021 0445   PLT 321 04/13/2021 0445   MCV 80.5 04/13/2021 0445   MCH 26.8 04/13/2021 0445   MCHC 33.3 04/13/2021 0445   RDW 15.8 (H) 04/13/2021 0445   LYMPHSABS 1.2 04/19/2020 0528   MONOABS 0.6 04/19/2020 0528   EOSABS 0.0 04/19/2020 0528   BASOSABS 0.0 04/19/2020 0528   Lab Results  Component Value Date   HGBA1C 6.7 (H) 01/13/2021     ASSESSMENT AND PLAN: 1. Hospital discharge follow-up   2. Chronic diastolic congestive heart failure (HCC) Compensated at this time on current medications. Encourage compliance with medications and continued low-salt diet. Strongly advised to discontinue street drug use especially cocaine. Given forms to apply for the  orange card/cone discount card.  Once approved we can refer him for the sleep study as previously planned. Advised patient that he can apply for disability if he desires.  However I think he will get better with medication compliance, cessation of street drug use, wgh loss and use of CPAP if dx with OSA when he has sleep study done  3. Essential hypertension Not at goal.  We will add low-dose of isosorbide. - isosorbide mononitrate (IMDUR) 30 MG 24 hr tablet; Take 1 tablet (30 mg total) by mouth daily.  Dispense: 30 tablet; Refill: 2  4. Stage 3b chronic kidney disease (HCC) Interplay of congestive heart failure.  Avoid NSAIDs. - Basic Metabolic Panel  5. Polysubstance abuse (HCC) Strongly advised to quit.  He declines referral to a treatment group at this time.  He states he will try to quit on his own  6. Chronic pain of right ankle Once he has been approved for the orange card, we will refer him to orthopedics.  7. Influenza vaccination declined Commended.  Patient declined.    Patient was given the opportunity to ask questions.  Patient verbalized understanding of the plan and was able to repeat key elements of the plan.   No orders of the defined types were placed in this encounter.    Requested Prescriptions    No prescriptions requested or ordered in this encounter    No follow-ups on file.  01/15/2021, MD, FACP

## 2021-05-11 LAB — BASIC METABOLIC PANEL
BUN/Creatinine Ratio: 12 (ref 9–20)
BUN: 24 mg/dL (ref 6–24)
CO2: 23 mmol/L (ref 20–29)
Calcium: 9.3 mg/dL (ref 8.7–10.2)
Chloride: 105 mmol/L (ref 96–106)
Creatinine, Ser: 1.93 mg/dL — ABNORMAL HIGH (ref 0.76–1.27)
Glucose: 145 mg/dL — ABNORMAL HIGH (ref 65–99)
Potassium: 4 mmol/L (ref 3.5–5.2)
Sodium: 142 mmol/L (ref 134–144)
eGFR: 43 mL/min/{1.73_m2} — ABNORMAL LOW (ref >59)

## 2021-05-13 ENCOUNTER — Telehealth: Payer: Self-pay

## 2021-05-13 NOTE — Telephone Encounter (Signed)
Contacted pt to go over lab results Phone call went straight to voicemail and I wasn't able to leave a message.

## 2021-05-14 ENCOUNTER — Telehealth: Payer: Self-pay | Admitting: Pharmacist

## 2021-05-14 ENCOUNTER — Telehealth: Payer: Self-pay | Admitting: Gerontology

## 2021-05-14 NOTE — Telephone Encounter (Signed)
-----  Message from Gean Maidens sent at 05/09/2021  1:53 PM EDT ----- Call patient to inform about the application process. ----- Message ----- From: Dannette Barbara Sent: 04/15/2021  10:03 AM EDT To: Harriette Bouillon Hedgebeth  Morning!  I think Meagan meant to send this your way as it's a referral to [facility].  It looks like patient met with Inez Catalina recently about eligibility but didn't turn everything in.     ----- Message ----- From: Magnus Ivan, LCSW Sent: 04/12/2021   3:02 PM EDT To: [facility] Case Mgr Assistant  This patient will need [facility] follow up. Will provide application. Thanks!

## 2021-05-14 NOTE — Telephone Encounter (Signed)
[employer]  -- Rhetta Mura - Tuesday, May 14, 2021 10:14 AM --Arneta Cliche [employer] application for [employer] 49/51 Take 1 tablet by mouth twice daily.

## 2021-05-15 ENCOUNTER — Ambulatory Visit: Payer: Self-pay | Admitting: Family

## 2021-05-16 ENCOUNTER — Other Ambulatory Visit: Payer: Self-pay

## 2021-05-17 ENCOUNTER — Other Ambulatory Visit: Payer: Self-pay

## 2021-05-22 ENCOUNTER — Other Ambulatory Visit: Payer: Self-pay

## 2021-05-23 ENCOUNTER — Other Ambulatory Visit: Payer: Self-pay

## 2021-05-23 MED ORDER — ISOSORBIDE MONONITRATE ER 30 MG PO TB24
30.0000 mg | ORAL_TABLET | Freq: Every day | ORAL | 2 refills | Status: DC
  Filled 2021-05-23: qty 30, 30d supply, fill #0

## 2021-05-24 ENCOUNTER — Other Ambulatory Visit: Payer: Self-pay

## 2021-05-29 ENCOUNTER — Other Ambulatory Visit: Payer: Self-pay | Admitting: Family

## 2021-05-29 ENCOUNTER — Other Ambulatory Visit: Payer: Self-pay

## 2021-05-29 NOTE — Progress Notes (Signed)
Opened in error

## 2021-05-30 ENCOUNTER — Ambulatory Visit: Payer: Self-pay | Admitting: Family

## 2021-06-08 NOTE — Progress Notes (Signed)
Patient ID: Brad Lynch., male    DOB: 12/14/1975, 45 y.o.   MRN: [id number]  HPI  Brad [name] is a 45 y/o male with a history of HTN, tobacco use, substance abuse and chronic heart failure.   Echo report from 01/13/21 reviewed and showed an EF of 60-65% along with severe LVH.   Admitted 04/10/21 due to shortness of breath and GI symptoms. Found to be hypertensive. Initially treated with IV lasix and then transitioned to oral diuretics. Troponin elevation thought to be due to demand ischemia. + cocaine use. Discharged after 3 days. Admitted 01/12/21 due to shortness of breath. Cardiology consult obtained. UDS + for cocaine. Initially given IV lasix with transition to oral diurets. Discharged the following day. Was in the ED 01/03/21 due to shortness where he was treated and released.   He presents today for a follow-up visit with a chief complaint of minimal fatigue upon moderate exertion. He describes this as having been present for several months but feels like his energy level has improved since he was last here. He has associated back pain and intermittent periods of apnea. He denies any dizziness, difficulty sleeping, abdominal distention, palpitations, pedal edema, chest pain, shortness of breath, cough or weight gain.   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 He says that he does a lot of walking at this job but that he really doesn't have any shortness of breath anymore.   Has been taking his hydralazine only twice a day. Did not go to work today because his back was hurting and requests a note for today.   Past Medical History:  Diagnosis Date   Asthma    CHF (congestive heart failure) (HCC)    Diverticulitis    Hypertension    Past Surgical History:  Procedure Laterality Date   ANKLE SURGERY     Family History  Problem Relation Age of Onset   Hypertension Mother    Social History   Tobacco Use   Smoking status: Every Day    Packs/day: 0.50     Types: Cigarettes   Smokeless tobacco: Never  Substance Use Topics   Alcohol use: Yes    Comment: occas   No Known Allergies  Prior to Admission medications   Medication Sig Start Date End Date Taking? Authorizing Provider  aspirin EC 81 MG EC tablet Take 1 tablet (81 mg total) by mouth daily. Swallow whole. 01/14/21  Yes Arnetha Courser, MD  carvedilol (COREG) 6.25 MG tablet Take 1 tablet (6.25 mg total) by mouth 2 (two) times daily with a meal. 05/09/21  Yes Clarisa Kindred A, FNP  furosemide (LASIX) 40 MG tablet Take 1 tablet (40 mg total) by mouth once daily. 05/09/21  Yes Clarisa Kindred A, FNP  hydrALAZINE (APRESOLINE) 100 MG tablet Take 1 tablet (100 mg total) by mouth 2 (two) times daily. 05/09/21 08/07/21 Yes Clarisa Kindred A, FNP  isosorbide mononitrate (IMDUR) 30 MG 24 hr tablet Take 1 tablet (30 mg total) by mouth once daily. 05/23/21  Yes Marcine Matar, MD  sacubitril-valsartan (ENTRESTO) 49-51 MG Take 1 tablet by mouth 2 (two) times daily. 04/08/21  Yes Nandan Willems, Inetta Fermo A, FNP  albuterol (VENTOLIN HFA) 108 (90 Base) MCG/ACT inhaler Inhale 2 puffs into the lungs every 4 (four) hours as needed for wheezing or shortness of breath. Patient not taking: Reported on 06/10/2021 01/13/21   Arnetha Courser, MD  rosuvastatin (CRESTOR) 20 MG tablet Take 1 tablet (20 mg total)  by mouth once daily. Patient not taking: Reported on 06/10/2021 05/09/21 05/09/22  Delma Freeze, FNP   Review of Systems  Constitutional:  Positive for fatigue. Negative for appetite change.  HENT:  Negative for congestion, postnasal drip and sore throat.   Eyes: Negative.   Respiratory:  Positive for apnea. Negative for cough, chest tightness and shortness of breath.        + snoring  Cardiovascular:  Negative for chest pain, palpitations and leg swelling.  Gastrointestinal:  Negative for abdominal distention and abdominal pain.  Endocrine: Negative.   Genitourinary: Negative.   Musculoskeletal:  Positive for back pain.  Negative for arthralgias and neck pain.  Skin: Negative.   Allergic/Immunologic: Negative.   Neurological:  Negative for dizziness and light-headedness.  Hematological:  Negative for adenopathy. Does not bruise/bleed easily.  Psychiatric/Behavioral:  Negative for dysphoric mood, hallucinations and sleep disturbance (sleeping on 1 pillow). The patient is not nervous/anxious.    Vitals:   06/10/21 1554  BP: (!) 191/108  Pulse: 72  Resp: 18  SpO2: 98%  Weight: 288 lb 8 oz (130.9 kg)  Height: 5\' 6"  (1.676 m)   Wt Readings from Last 3 Encounters:  06/10/21 288 lb 8 oz (130.9 kg)  05/10/21 295 lb 6.4 oz (134 kg)  04/30/21 (!) 300 lb 4 oz (136.2 kg)   Lab Results  Component Value Date   CREATININE 1.93 (H) 05/10/2021   CREATININE 2.06 (H) 04/13/2021   CREATININE 2.00 (H) 04/12/2021   Physical Exam Vitals and nursing note reviewed.  Constitutional:      Appearance: Normal appearance.  HENT:     Head: Normocephalic and atraumatic.  Cardiovascular:     Rate and Rhythm: Normal rate and regular rhythm.  Pulmonary:     Effort: Pulmonary effort is normal. No respiratory distress.     Breath sounds: No wheezing or rales.  Abdominal:     General: There is no distension.     Palpations: Abdomen is soft.     Tenderness: There is no abdominal tenderness.  Musculoskeletal:        General: No tenderness.     Cervical back: Normal range of motion and neck supple.     Right lower leg: No edema.     Left lower leg: No edema.  Skin:    General: Skin is warm and dry.  Neurological:     General: No focal deficit present.     Mental Status: He is alert and oriented to person, place, and time.  Psychiatric:        Mood and Affect: Mood normal.        Behavior: Behavior normal.        Thought Content: Thought content normal.    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)- - NYHA class II - euvolemic today - not weighing  daily but does have his scales; reminded to call for an overnight weight gain of > 2 pounds or a weekly weight gain of > 5 pounds - weight down 12 pounds from last visit here 1 month ago; doing a lot more walking at work - does not add salt to his food; reviewed the importance of reading food labels for sodium content - did not show for cardiology appt with Dr. 04/14/2021 on 02/04/21 - he's unsure of his fluid intake; explained that he needed to drink ~ 60 ounces of fluid daily - BNP 04/10/21 was 127.7  2:  HTN with CKD- - BP initially elevated (191/108) but had improved some upon recheck (180/100) - has been taking his hydralazine BID and he was instructed to increase it to TID - saw PCP Laural Benes) 05/10/21 - BMP 05/10/21 reviewed and showed sodium 142, potassium 4.0, creatinine 1.93 and GFR 43  3: Tobacco use- - smoking 3-4 cigarettes daily - drinks alcohol on social occasions only (birthday/ holiday) - uses marijuana - denies any recent cocaine  4: Snoring- - patient continues to endorse snoring on occasion - needs sleep study to rule out sleep apnea - will message PCP about the ability for him to get a sleep study as this could be contributing to his HTN   Medication bottles reviewed.   Return in 1 month or sooner for any questions/problems before then.

## 2021-06-10 ENCOUNTER — Ambulatory Visit: Payer: Self-pay | Attending: Family | Admitting: Family

## 2021-06-10 ENCOUNTER — Encounter: Payer: Self-pay | Admitting: Family

## 2021-06-10 ENCOUNTER — Other Ambulatory Visit: Payer: Self-pay

## 2021-06-10 VITALS — BP 191/108 | HR 72 | Resp 18 | Ht 66.0 in | Wt 288.5 lb

## 2021-06-10 DIAGNOSIS — I10 Essential (primary) hypertension: Secondary | ICD-10-CM

## 2021-06-10 DIAGNOSIS — Z8249 Family history of ischemic heart disease and other diseases of the circulatory system: Secondary | ICD-10-CM | POA: Insufficient documentation

## 2021-06-10 DIAGNOSIS — Z720 Tobacco use: Secondary | ICD-10-CM

## 2021-06-10 DIAGNOSIS — Z7982 Long term (current) use of aspirin: Secondary | ICD-10-CM | POA: Insufficient documentation

## 2021-06-10 DIAGNOSIS — F17210 Nicotine dependence, cigarettes, uncomplicated: Secondary | ICD-10-CM | POA: Insufficient documentation

## 2021-06-10 DIAGNOSIS — R0683 Snoring: Secondary | ICD-10-CM

## 2021-06-10 DIAGNOSIS — M549 Dorsalgia, unspecified: Secondary | ICD-10-CM | POA: Insufficient documentation

## 2021-06-10 DIAGNOSIS — I5032 Chronic diastolic (congestive) heart failure: Secondary | ICD-10-CM

## 2021-06-10 DIAGNOSIS — R0681 Apnea, not elsewhere classified: Secondary | ICD-10-CM | POA: Insufficient documentation

## 2021-06-10 DIAGNOSIS — I130 Hypertensive heart and chronic kidney disease with heart failure and stage 1 through stage 4 chronic kidney disease, or unspecified chronic kidney disease: Secondary | ICD-10-CM | POA: Insufficient documentation

## 2021-06-10 DIAGNOSIS — Z79899 Other long term (current) drug therapy: Secondary | ICD-10-CM | POA: Insufficient documentation

## 2021-06-10 NOTE — Patient Instructions (Addendum)
Resume weighing daily and call for an overnight weight gain of > 2 pounds or a weekly weight gain of >5 pounds.    Increase hydralazine to 1 tablet three times a day

## 2021-07-10 NOTE — Progress Notes (Deleted)
Patient ID: Brad [name]., male    DOB: Sep 19, 1975, 45 y.o.   MRN: [id number]  HPI  Mr Medlen is a 45 y/o male with a history of HTN, tobacco use, substance abuse and chronic heart failure.   Echo report from 01/13/21 reviewed and showed an EF of 60-65% along with severe LVH.   Admitted 04/10/21 due to shortness of breath and GI symptoms. Found to be hypertensive. Initially treated with IV lasix and then transitioned to oral diuretics. Troponin elevation thought to be due to demand ischemia. + cocaine use. Discharged after 3 days. Admitted 01/12/21 due to shortness of breath. Cardiology consult obtained. UDS + for cocaine. Initially given IV lasix with transition to oral diurets. Discharged the following day. Was in the ED 01/03/21 due to shortness where he was treated and released.   He presents today for a follow-up visit with a chief complaint of   Past Medical History:  Diagnosis Date   Asthma    CHF (congestive heart failure) (HCC)    Diverticulitis    Hypertension    Past Surgical History:  Procedure Laterality Date   ANKLE SURGERY     Family History  Problem Relation Age of Onset   Hypertension Mother    Social History   Tobacco Use   Smoking status: Every Day    Packs/day: 0.50    Types: Cigarettes   Smokeless tobacco: Never  Substance Use Topics   Alcohol use: Yes    Comment: occas   No Known Allergies   Review of Systems  Constitutional:  Positive for fatigue. Negative for appetite change.  HENT:  Negative for congestion, postnasal drip and sore throat.   Eyes: Negative.   Respiratory:  Positive for apnea. Negative for cough, chest tightness and shortness of breath.        + snoring  Cardiovascular:  Negative for chest pain, palpitations and leg swelling.  Gastrointestinal:  Negative for abdominal distention and abdominal pain.  Endocrine: Negative.   Genitourinary: Negative.   Musculoskeletal:  Positive for back pain. Negative for arthralgias and  neck pain.  Skin: Negative.   Allergic/Immunologic: Negative.   Neurological:  Negative for dizziness and light-headedness.  Hematological:  Negative for adenopathy. Does not bruise/bleed easily.  Psychiatric/Behavioral:  Negative for dysphoric mood, hallucinations and sleep disturbance (sleeping on 1 pillow). The patient is not nervous/anxious.      Physical Exam Vitals and nursing note reviewed.  Constitutional:      Appearance: Normal appearance.  HENT:     Head: Normocephalic and atraumatic.  Cardiovascular:     Rate and Rhythm: Normal rate and regular rhythm.  Pulmonary:     Effort: Pulmonary effort is normal. No respiratory distress.     Breath sounds: No wheezing or rales.  Abdominal:     General: There is no distension.     Palpations: Abdomen is soft.     Tenderness: There is no abdominal tenderness.  Musculoskeletal:        General: No tenderness.     Cervical back: Normal range of motion and neck supple.     Right lower leg: No edema.     Left lower leg: No edema.  Skin:    General: Skin is warm and dry.  Neurological:     General: No focal deficit present.     Mental Status: He is alert and oriented to person, place, and time.  Psychiatric:        Mood and Affect: Mood  normal.        Behavior: Behavior normal.        Thought Content: Thought content normal.    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)- - NYHA class II - euvolemic today - not weighing daily but does have his scales; reminded to call for an overnight weight gain of > 2 pounds or a weekly weight gain of > 5 pounds - weight 288.8 pounds from last visit here 1 month ago - does not add salt to his food; reviewed the importance of reading food labels for sodium content - did not show for cardiology appt with Dr. Welton Flakes on 02/04/21 - he's unsure of his fluid intake; explained that he needed to drink ~ 60 ounces of fluid daily - BNP  04/10/21 was 127.7  2: HTN with CKD- - BP  - saw PCP Laural Benes) 05/10/21 - BMP 05/10/21 reviewed and showed sodium 142, potassium 4.0, creatinine 1.93 and GFR 43  3: Tobacco use- - smoking 3-4 cigarettes daily - drinks alcohol on social occasions only (birthday/ holiday) - uses marijuana - denies any recent cocaine  4: Snoring- - patient continues to endorse snoring on occasion - needs sleep study to rule out sleep apnea    Medication bottles reviewed.

## 2021-07-11 ENCOUNTER — Telehealth: Payer: Self-pay | Admitting: Family

## 2021-07-11 ENCOUNTER — Ambulatory Visit: Payer: Self-pay | Admitting: Family

## 2021-07-11 NOTE — Telephone Encounter (Signed)
Patient did not show for his Heart Failure Clinic appointment on 07/11/21 even after confirming appointment. Will attempt to reschedule.

## 2021-08-01 ENCOUNTER — Ambulatory Visit: Payer: Self-pay | Attending: Family | Admitting: Family

## 2021-08-01 ENCOUNTER — Encounter: Payer: Self-pay | Admitting: Family

## 2021-08-01 ENCOUNTER — Other Ambulatory Visit: Payer: Self-pay

## 2021-08-01 VITALS — BP 182/114 | HR 90 | Resp 20 | Ht 66.0 in | Wt 286.0 lb

## 2021-08-01 DIAGNOSIS — I5032 Chronic diastolic (congestive) heart failure: Secondary | ICD-10-CM

## 2021-08-01 DIAGNOSIS — Z720 Tobacco use: Secondary | ICD-10-CM

## 2021-08-01 DIAGNOSIS — G8929 Other chronic pain: Secondary | ICD-10-CM | POA: Insufficient documentation

## 2021-08-01 DIAGNOSIS — I10 Essential (primary) hypertension: Secondary | ICD-10-CM

## 2021-08-01 DIAGNOSIS — F1290 Cannabis use, unspecified, uncomplicated: Secondary | ICD-10-CM | POA: Insufficient documentation

## 2021-08-01 DIAGNOSIS — Z79899 Other long term (current) drug therapy: Secondary | ICD-10-CM | POA: Insufficient documentation

## 2021-08-01 DIAGNOSIS — I130 Hypertensive heart and chronic kidney disease with heart failure and stage 1 through stage 4 chronic kidney disease, or unspecified chronic kidney disease: Secondary | ICD-10-CM | POA: Insufficient documentation

## 2021-08-01 DIAGNOSIS — R0683 Snoring: Secondary | ICD-10-CM

## 2021-08-01 DIAGNOSIS — N189 Chronic kidney disease, unspecified: Secondary | ICD-10-CM | POA: Insufficient documentation

## 2021-08-01 DIAGNOSIS — J45909 Unspecified asthma, uncomplicated: Secondary | ICD-10-CM | POA: Insufficient documentation

## 2021-08-01 DIAGNOSIS — F17210 Nicotine dependence, cigarettes, uncomplicated: Secondary | ICD-10-CM | POA: Insufficient documentation

## 2021-08-01 NOTE — Progress Notes (Signed)
Patient ID: Brad [name]., male    DOB: 05/05/1976, 45 y.o.   MRN: [id number]  HPI  Brad [name] is a 45 y/o male with a history of HTN, tobacco use, substance abuse and chronic heart failure.   Echo report from 01/13/21 reviewed and showed an EF of 60-65% along with severe LVH.   Admitted 04/10/21 due to shortness of breath and GI symptoms. Found to be hypertensive. Initially treated with IV lasix and then transitioned to oral diuretics. Troponin elevation thought to be due to demand ischemia. + cocaine use. Discharged after 3 days. Admitted 01/12/21 due to shortness of breath. Cardiology consult obtained. UDS + for cocaine. Initially given IV lasix with transition to oral diurets. Discharged the following day. Was in the ED 01/03/21 due to shortness where he was treated and released.   He presents today for a follow-up visit with a chief complaint of minimal fatigue upon moderate exertion. He describes this as chronic in nature having been present for several years. He has associated periods of apnea and chronic back pain along with this. He denies any difficulty sleeping, dizziness, abdominal distention, palpitations, pedal edema, chest pain, shortness of breath, cough or weight gain.   Hasn't been weighing daily but does have scales at home. Not adding salt but occasionally eats something salty. He said that he used some sore of chicken seasoning recently and it tasted so salty and made his heart race that he now knows that he can't use that.   Confusion regarding his medications as he's only been taking carvedilol once a day (instead of BID) and hydralazine 1-2 times/ day (instead of TID).   Past Medical History:  Diagnosis Date   Asthma    CHF (congestive heart failure) (HCC)    Diverticulitis    Hypertension    Past Surgical History:  Procedure Laterality Date   ANKLE SURGERY     Family History  Problem Relation Age of Onset   Hypertension Mother    Social History   Tobacco  Use   Smoking status: Every Day    Packs/day: 0.50    Types: Cigarettes   Smokeless tobacco: Never  Substance Use Topics   Alcohol use: Yes    Comment: occas   No Known Allergies  Prior to Admission medications   Medication Sig Start Date End Date Taking? Authorizing Provider  carvedilol (COREG) 6.25 MG tablet Taking 1 tablet (6.25 mg total) by mouth once daily 05/09/21  Yes Alysandra Lobue A, FNP  furosemide (LASIX) 40 MG tablet Take 1 tablet (40 mg total) by mouth once daily. 05/09/21  Yes Clarisa Kindred A, FNP  hydrALAZINE (APRESOLINE) 100 MG tablet Take 1 tablet (100 mg total) by mouth 3 (three) times daily. Patient taking differently: Take 100 mg by mouth 1- 2 times daily. 05/09/21 08/07/21 Yes Shaniah Baltes A, FNP  sacubitril-valsartan (ENTRESTO) 49-51 MG Take 1 tablet by mouth 2 (two) times daily. 04/08/21  Yes Laurrie Toppin, Inetta Fermo A, FNP  albuterol (VENTOLIN HFA) 108 (90 Base) MCG/ACT inhaler Inhale 2 puffs into the lungs every 4 (four) hours as needed for wheezing or shortness of breath. Patient not taking: Reported on 06/10/2021 01/13/21   Arnetha Courser, MD  aspirin EC 81 MG EC tablet Take 1 tablet (81 mg total) by mouth daily. Swallow whole. Patient not taking: Reported on 08/01/2021 01/14/21   Arnetha Courser, MD  isosorbide mononitrate (IMDUR) 30 MG 24 hr tablet Take 1 tablet (30 mg total) by mouth once daily. Patient  not taking: Reported on 08/01/2021 05/23/21   Marcine Matar, MD  rosuvastatin (CRESTOR) 20 MG tablet Take 1 tablet (20 mg total) by mouth once daily. Patient not taking: Reported on 06/10/2021 05/09/21 05/09/22  Delma Freeze, FNP    Review of Systems  Constitutional:  Positive for fatigue. Negative for appetite change.  HENT:  Negative for congestion, postnasal drip and sore throat.   Eyes: Negative.   Respiratory:  Positive for apnea. Negative for cough, chest tightness and shortness of breath.        + snoring  Cardiovascular:  Negative for chest pain, palpitations and  leg swelling.  Gastrointestinal:  Negative for abdominal distention and abdominal pain.  Endocrine: Negative.   Genitourinary: Negative.   Musculoskeletal:  Positive for back pain. Negative for arthralgias and neck pain.  Skin: Negative.   Allergic/Immunologic: Negative.   Neurological:  Negative for dizziness and light-headedness.  Hematological:  Negative for adenopathy. Does not bruise/bleed easily.  Psychiatric/Behavioral:  Negative for dysphoric mood, hallucinations and sleep disturbance (sleeping on 1 pillow). The patient is not nervous/anxious.    Vitals:   08/01/21 1315 08/01/21 1406  BP: (!) 203/132 (!) 182/114  Pulse: 90   Resp: 20   SpO2: 97%   Weight: 286 lb (129.7 kg)   Height: 5\' 6"  (1.676 m)    Wt Readings from Last 3 Encounters:  08/01/21 286 lb (129.7 kg)  06/10/21 288 lb 8 oz (130.9 kg)  05/10/21 295 lb 6.4 oz (134 kg)   Lab Results  Component Value Date   CREATININE 1.93 (H) 05/10/2021   CREATININE 2.06 (H) 04/13/2021   CREATININE 2.00 (H) 04/12/2021   Physical Exam Vitals and nursing note reviewed.  Constitutional:      Appearance: Normal appearance.  HENT:     Head: Normocephalic and atraumatic.  Cardiovascular:     Rate and Rhythm: Normal rate and regular rhythm.  Pulmonary:     Effort: Pulmonary effort is normal. No respiratory distress.     Breath sounds: No wheezing or rales.  Abdominal:     General: There is no distension.     Palpations: Abdomen is soft.     Tenderness: There is no abdominal tenderness.  Musculoskeletal:        General: No tenderness.     Cervical back: Normal range of motion and neck supple.     Right lower leg: No edema.     Left lower leg: No edema.  Skin:    General: Skin is warm and dry.  Neurological:     General: No focal deficit present.     Mental Status: He is alert and oriented to person, place, and time.  Psychiatric:        Mood and Affect: Mood normal.        Behavior: Behavior normal.        Thought  Content: Thought content normal.    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)- - NYHA class II - euvolemic today - not weighing daily but does have his scales; reminded to call for an overnight weight gain of > 2 pounds or a weekly weight gain of > 5 pounds - weight down 2 pounds from last visit here 2 months ago - does not add salt to his food; reviewed the importance of reading food labels for sodium content - has only been taking his carvedilol once daily; emphasized that he had to take it  BID along with his BID entresto - did not show for cardiology appt with Dr. Welton Flakes on 02/04/21 - he's unsure of his fluid intake; explained that he needed to drink ~ 60 ounces of fluid daily - BNP 04/10/21 was 127.7  2: HTN with CKD- - BP initially quite elevated (203/132) but he was rushing to get here as he just woke up and he hasn't taken any of his medications yet today (it's 1:15pm when he arrived) - has only been taking his hydralazine 1-2 times/ day; emphasized that he needed to take it TID - patient took his medications while in the office, waited ~ 20 minutes and rechecked his BP and it was starting to come down at 182/114 - reviewed the importance of taking all his medications correctly as prescribed as his uncontrolled HTN puts him at high risk of a stroke - saw PCP Laural Benes) 05/10/21; returns January 2023 - BMP 05/10/21 reviewed and showed sodium 142, potassium 4.0, creatinine 1.93 and GFR 43  3: Tobacco use- - smoking 3-4 cigarettes daily - drinks alcohol on social occasions only (birthday/ holiday) - uses marijuana almost daily - last cocaine use was 5 days ago; discussed that cocaine use put him at high risk for a stroke and / or MI  4: Snoring- - patient continues to endorse snoring on occasion - needs sleep study to rule out sleep apnea - says that he has the orange card application from his PCP; emphasized that he  needed to get this completed so that he can get a sleep study done   Medication bottles reviewed.   Return in 1 month or sooner for any questions/problems before then.

## 2021-08-01 NOTE — Patient Instructions (Addendum)
Resume weighing daily and call for an overnight weight gain of 3 pounds or more or a weekly weight gain of more than 5 pounds.     Fill out the orange card information that your primary care doctor in [place] gave you.    Take your carvedilol and entresto twice a day.   Take hydralazine three times a day.

## 2021-08-07 ENCOUNTER — Other Ambulatory Visit: Payer: Self-pay

## 2021-08-08 ENCOUNTER — Other Ambulatory Visit: Payer: Self-pay | Admitting: Family

## 2021-08-08 ENCOUNTER — Other Ambulatory Visit: Payer: Self-pay

## 2021-08-08 DIAGNOSIS — I129 Hypertensive chronic kidney disease with stage 1 through stage 4 chronic kidney disease, or unspecified chronic kidney disease: Secondary | ICD-10-CM

## 2021-08-08 MED ORDER — HYDRALAZINE HCL 100 MG PO TABS
100.0000 mg | ORAL_TABLET | Freq: Three times a day (TID) | ORAL | 3 refills | Status: DC
  Filled 2021-08-08: qty 270, 90d supply, fill #0

## 2021-08-08 MED ORDER — FUROSEMIDE 40 MG PO TABS
40.0000 mg | ORAL_TABLET | Freq: Every day | ORAL | 3 refills | Status: DC
  Filled 2021-08-08: qty 90, 90d supply, fill #0

## 2021-08-08 MED ORDER — SACUBITRIL-VALSARTAN 49-51 MG PO TABS
1.0000 | ORAL_TABLET | Freq: Two times a day (BID) | ORAL | 3 refills | Status: DC
  Filled 2021-08-08: qty 60, 30d supply, fill #0
  Filled 2021-09-02: qty 60, 30d supply, fill #1
  Filled 2021-09-19: qty 180, 90d supply, fill #1

## 2021-08-08 MED ORDER — CARVEDILOL 6.25 MG PO TABS
6.2500 mg | ORAL_TABLET | Freq: Two times a day (BID) | ORAL | 3 refills | Status: DC
  Filled 2021-08-08: qty 180, 90d supply, fill #0

## 2021-08-08 NOTE — Progress Notes (Signed)
RX sen to medication management clinic

## 2021-08-13 ENCOUNTER — Telehealth: Payer: Self-pay | Admitting: Pharmacist

## 2021-08-13 NOTE — Telephone Encounter (Signed)
08/13/2021 10:14:28 AM - Call to [employer] [facility] on [address]  -- Rhetta Mura - Tuesday, August 13, 2021 10:12 AM --[employer] on patient's Entresto--spoke with Hope Pigeon stated has previously contacted patient to set up delivery-No response. I was able today to set up the shipment to OUR office, they will ship today and deliver 08/15/21.

## 2021-08-22 ENCOUNTER — Other Ambulatory Visit: Payer: Self-pay

## 2021-09-02 ENCOUNTER — Other Ambulatory Visit: Payer: Self-pay

## 2021-09-02 NOTE — Progress Notes (Deleted)
Patient ID: Brad Lynch., male    DOB: 1976/03/11, 46 y.o.   MRN: [id number]  HPI  Brad [name] is a 46 y/o male with a history of HTN, tobacco use, substance abuse and chronic heart failure.   Echo report from 01/13/21 reviewed and showed an EF of 60-65% along with severe LVH.   Admitted 04/10/21 due to shortness of breath and GI symptoms. Found to be hypertensive. Initially treated with IV lasix and then transitioned to oral diuretics. Troponin elevation thought to be due to demand ischemia. + cocaine use. Discharged after 3 days.   He presents today for a follow-up visit with a chief complaint of   Past Medical History:  Diagnosis Date   Asthma    CHF (congestive heart failure) (HCC)    Diverticulitis    Hypertension    Past Surgical History:  Procedure Laterality Date   ANKLE SURGERY     Family History  Problem Relation Age of Onset   Hypertension Mother    Social History   Tobacco Use   Smoking status: Every Day    Packs/day: 0.50    Types: Cigarettes   Smokeless tobacco: Never  Substance Use Topics   Alcohol use: Yes    Comment: occas   No Known Allergies    Review of Systems  Constitutional:  Positive for fatigue. Negative for appetite change.  HENT:  Negative for congestion, postnasal drip and sore throat.   Eyes: Negative.   Respiratory:  Positive for apnea. Negative for cough, chest tightness and shortness of breath.        + snoring  Cardiovascular:  Negative for chest pain, palpitations and leg swelling.  Gastrointestinal:  Negative for abdominal distention and abdominal pain.  Endocrine: Negative.   Genitourinary: Negative.   Musculoskeletal:  Positive for back pain. Negative for arthralgias and neck pain.  Skin: Negative.   Allergic/Immunologic: Negative.   Neurological:  Negative for dizziness and light-headedness.  Hematological:  Negative for adenopathy. Does not bruise/bleed easily.  Psychiatric/Behavioral:  Negative for dysphoric mood,  hallucinations and sleep disturbance (sleeping on 1 pillow). The patient is not nervous/anxious.      Physical Exam Vitals and nursing note reviewed.  Constitutional:      Appearance: Normal appearance.  HENT:     Head: Normocephalic and atraumatic.  Cardiovascular:     Rate and Rhythm: Normal rate and regular rhythm.  Pulmonary:     Effort: Pulmonary effort is normal. No respiratory distress.     Breath sounds: No wheezing or rales.  Abdominal:     General: There is no distension.     Palpations: Abdomen is soft.     Tenderness: There is no abdominal tenderness.  Musculoskeletal:        General: No tenderness.     Cervical back: Normal range of motion and neck supple.     Right lower leg: No edema.     Left lower leg: No edema.  Skin:    General: Skin is warm and dry.  Neurological:     General: No focal deficit present.     Mental Status: He is alert and oriented to person, place, and time.  Psychiatric:        Mood and Affect: Mood normal.        Behavior: Behavior normal.        Thought Content: Thought content normal.    Assessment & Plan:  1: Chronic heart failure with preserved ejection fraction with structural  changes (LVH)- - NYHA class II - euvolemic today - not weighing daily but does have his scales; reminded to call for an overnight weight gain of > 2 pounds or a weekly weight gain of > 5 pounds - weight 286 pounds from last visit here 1 month ago - does not add salt to his food; reviewed the importance of reading food labels for sodium content - on GDMT of entresto - did not show for cardiology appt with Dr. Welton Flakes on 02/04/21 - he's unsure of his fluid intake; explained that he needed to drink ~ 60 ounces of fluid daily - BNP 04/10/21 was 127.7  2: HTN with CKD- - BP  - saw PCP Laural Benes) 05/10/21; returns January 2023 - BMP 05/10/21 reviewed and showed sodium 142, potassium 4.0, creatinine 1.93 and GFR 43  3: Tobacco use- - smoking 3-4 cigarettes daily -  drinks alcohol on social occasions only (birthday/ holiday) - uses marijuana almost daily - last cocaine use was 5 days ago; discussed that cocaine use put him at high risk for a stroke and / or MI  4: Snoring- - patient continues to endorse snoring on occasion - needs sleep study to rule out sleep apnea - says that he has the orange card application from his PCP; emphasized that he needed to get this completed so that he can get a sleep study done   Medication bottles reviewed.

## 2021-09-03 ENCOUNTER — Telehealth: Payer: Self-pay | Admitting: Family

## 2021-09-03 ENCOUNTER — Ambulatory Visit: Payer: Self-pay | Admitting: Family

## 2021-09-03 NOTE — Telephone Encounter (Signed)
Patient did not show for his [facility] Failure [facility] appointment on 09/03/21 even after confirming appointment. Will attempt to reschedule.

## 2021-09-13 ENCOUNTER — Ambulatory Visit: Payer: Self-pay | Admitting: Internal Medicine

## 2021-09-17 ENCOUNTER — Ambulatory Visit: Payer: Self-pay | Attending: Family | Admitting: Family

## 2021-09-17 ENCOUNTER — Encounter: Payer: Self-pay | Admitting: Pharmacist

## 2021-09-17 ENCOUNTER — Encounter: Payer: Self-pay | Admitting: Family

## 2021-09-17 ENCOUNTER — Other Ambulatory Visit: Payer: Self-pay

## 2021-09-17 VITALS — BP 168/116 | HR 83 | Resp 20 | Ht 66.0 in | Wt 286.0 lb

## 2021-09-17 DIAGNOSIS — R0683 Snoring: Secondary | ICD-10-CM | POA: Insufficient documentation

## 2021-09-17 DIAGNOSIS — F17210 Nicotine dependence, cigarettes, uncomplicated: Secondary | ICD-10-CM | POA: Insufficient documentation

## 2021-09-17 DIAGNOSIS — I5032 Chronic diastolic (congestive) heart failure: Secondary | ICD-10-CM | POA: Insufficient documentation

## 2021-09-17 DIAGNOSIS — I129 Hypertensive chronic kidney disease with stage 1 through stage 4 chronic kidney disease, or unspecified chronic kidney disease: Secondary | ICD-10-CM

## 2021-09-17 DIAGNOSIS — N189 Chronic kidney disease, unspecified: Secondary | ICD-10-CM | POA: Insufficient documentation

## 2021-09-17 DIAGNOSIS — I130 Hypertensive heart and chronic kidney disease with heart failure and stage 1 through stage 4 chronic kidney disease, or unspecified chronic kidney disease: Secondary | ICD-10-CM | POA: Insufficient documentation

## 2021-09-17 DIAGNOSIS — Z720 Tobacco use: Secondary | ICD-10-CM

## 2021-09-17 DIAGNOSIS — N1831 Chronic kidney disease, stage 3a: Secondary | ICD-10-CM

## 2021-09-17 DIAGNOSIS — Z79899 Other long term (current) drug therapy: Secondary | ICD-10-CM | POA: Insufficient documentation

## 2021-09-17 MED ORDER — METOPROLOL SUCCINATE ER 25 MG PO TB24
25.0000 mg | ORAL_TABLET | Freq: Every day | ORAL | 3 refills | Status: DC
  Filled 2021-09-17: qty 90, 90d supply, fill #0

## 2021-09-17 MED ORDER — AMLODIPINE BESYLATE 5 MG PO TABS
5.0000 mg | ORAL_TABLET | Freq: Every day | ORAL | 3 refills | Status: DC
  Filled 2021-09-17: qty 90, 90d supply, fill #0

## 2021-09-17 NOTE — Progress Notes (Signed)
Patient ID: Brad [name]., male    DOB: [birth year], 46 y.o.   MRN: [id number]   [name] is [name] 46 y/o male with [name] history of HTN, tobacco use, substance abuse and chronic heart failure.   Echo report from 01/13/21 reviewed and showed an EF of 60-65% along with severe LVH.   Admitted 04/10/21 due to shortness of breath and GI symptoms. Found to be hypertensive. Initially treated with IV lasix and then transitioned to oral diuretics. Troponin elevation thought to be due to demand ischemia. + cocaine use. Discharged after 3 days.   He presents today for [name] follow-up visit with [name] chief complaint of minimal fatigue upon moderate exertion. He describes this as chronic in nature having been present for several years. He has associated periods of chest pain, SOB, difficulty sleeping, and chronic back pain along with this. He denies any dizziness, abdominal distention, palpitations, pedal edema, chest pain, cough or weight gain.   Hasn't been weighing daily but does have scales at home. Not adding salt but occasionally eats something salty.   Continued confusion regarding his medications as he's only been taking carvedilol once [name] day (instead of BID) and hydralazine 1-2 times/ day (instead of TID).   He reports he had chest pain over the weekend that "lasted all weekend", as well as [name] headache. He endorses eating chicken wings the day before his chest pain started. He had to miss two days of work d/t this.   Past Medical History:  Diagnosis Date   Asthma    CHF (congestive heart failure) (HCC)    Diverticulitis    Hypertension    Past Surgical History:  Procedure Laterality Date   ANKLE SURGERY     Family History  Problem Relation Age of Onset   Hypertension Mother    Social History   Tobacco Use   Smoking status: Every Day    Packs/day: 0.50    Types: Cigarettes   Smokeless tobacco: Never  Substance Use Topics   Alcohol use: Yes    Comment: occas   No Known  Allergies  Prior to Admission medications   Medication Sig Start Date End Date Taking? Authorizing Provider  furosemide (LASIX) 40 MG tablet Take 1 tablet (40 mg total) by mouth once daily. 08/08/21  Yes Brad [name], Brad [name]  hydrALAZINE (APRESOLINE) 100 MG tablet Take 1 tablet (100 mg total) by mouth 3 (three) times daily. Patient taking differently: Take 100 mg by mouth 3 (three) times daily. Patient taking BID 08/08/21 11/18/21 Yes Brad [name], Brad [name], Brad [name]  sacubitril-valsartan (ENTRESTO) 49-51 MG Take 1 tablet by mouth 2 (two) times daily. 08/08/21  Yes Brad [name], Brad [name], Brad [name]  albuterol (VENTOLIN HFA) 108 (90 Base) MCG/ACT inhaler Inhale 2 puffs into the lungs every 4 (four) hours as needed for wheezing or shortness of breath. Patient not taking: Reported on 06/10/2021 01/13/21   Brad [name], Brad [name]  aspirin EC 81 MG EC tablet Take 1 tablet (81 mg total) by mouth daily. Swallow whole. Patient not taking: Reported on 08/01/2021 01/14/21   Brad [name], Brad [name]  isosorbide mononitrate (IMDUR) 30 MG 24 hr tablet Take 1 tablet (30 mg total) by mouth once daily. Patient not taking: Reported on 08/01/2021 05/23/21   Brad [name], Brad [name]  rosuvastatin (CRESTOR) 20 MG tablet Take 1 tablet (20 mg total) by mouth once daily. Patient not taking: Reported on 06/10/2021 05/09/21 05/09/22  Brad [name], Brad [name]    Review of Systems  Constitutional:  Positive for fatigue. Negative for appetite change.  HENT:  Negative for congestion, postnasal drip and sore throat.   Eyes: Negative.   Respiratory:  Positive for apnea. Negative for cough, chest tightness and shortness of breath.        + snoring  Cardiovascular:  Positive for chest pain. Negative for palpitations and leg swelling.  Gastrointestinal:  Negative for abdominal distention and abdominal pain.  Endocrine: Negative.   Genitourinary: Negative.   Musculoskeletal:  Positive for back pain. Negative for arthralgias and neck pain.  Skin: Negative.    Allergic/Immunologic: Negative.   Neurological:  Negative for dizziness and light-headedness.  Hematological:  Negative for adenopathy. Does not bruise/bleed easily.  Psychiatric/Behavioral:  Positive for sleep disturbance (sleeping on 1 pillow). Negative for dysphoric mood and hallucinations. The patient is not nervous/anxious.    Vitals:   09/17/21 1058  BP: (!) 168/116  Pulse: 83  Resp: 20  SpO2: 95%  Weight: 286 lb (129.7 kg)  Height: 5\' 6"  (1.676 m)   Wt Readings from Last 3 Encounters:  09/17/21 286 lb (129.7 kg)  08/01/21 286 lb (129.7 kg)  06/10/21 288 lb 8 oz (130.9 kg)   Lab Results  Component Value Date   CREATININE 1.93 (H) 05/10/2021   CREATININE 2.06 (H) 04/13/2021   CREATININE 2.00 (H) 04/12/2021   Physical Exam Vitals and nursing note reviewed.  Constitutional:      Appearance: Normal appearance.  HENT:     Head: Normocephalic and atraumatic.  Cardiovascular:     Rate and Rhythm: Normal rate and regular rhythm.  Pulmonary:     Effort: Pulmonary effort is normal. No respiratory distress.     Breath sounds: No wheezing or rales.  Abdominal:     General: There is no distension.     Palpations: Abdomen is soft.     Tenderness: There is no abdominal tenderness.  Musculoskeletal:        General: No tenderness.     Cervical back: Normal range of motion and neck supple.     Right lower leg: No edema.     Left lower leg: No edema.  Skin:    General: Skin is warm and dry.  Neurological:     General: No focal deficit present.     Mental Status: He is alert and oriented to person, place, and time.  Psychiatric:        Mood and Affect: Mood normal.        Behavior: Behavior normal.        Thought Content: Thought content normal.    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)- - NYHA class II - euvolemic today - not weighing daily but does have his scales; reminded to call for an overnight  weight gain of > 2 pounds or [name] weekly weight gain of > 5 pounds - weight 287 today, was 286 at his last visit - does not add salt to his food; reviewed the importance of reading food labels for sodium content - has only been taking his carvedilol once daily so will change his coreg to metoprolol 25 mg qd due to ongoing difficulties remembering to take coreg twice daily  - did not show for cardiology appt with Brad [name] on 02/04/21 - he's unsure of his fluid intake; explained that he needed to drink ~ 60 ounces of fluid daily - BNP 04/10/21 was 127.7  2: HTN with CKD- - BP 168/116 initially,  re-check 187/106 - has only been taking his hydralazine 1-2 times/day "if I take it at all"; emphasized that he needed to take it TID - will add amlodipine 5 mg qd - reviewed the importance of taking all his medications correctly as prescribed as his uncontrolled HTN puts him at high risk of [name] stroke - saw PCP Brad [name]) 05/10/21 - BMP 05/10/21 reviewed and showed sodium 142, potassium 4.0, creatinine 1.93 and GFR 43  3: Tobacco use- - smoking 3-4 cigarettes daily - drinks alcohol on social occasions only (birthday/ holiday)  4: Snoring- - patient continues to endorse snoring on occasion - needs sleep study to rule out sleep apnea - says that he has the orange card application from his PCP; emphasized that he needed to get this completed so that he can get [name] sleep study done   Medication bottles reviewed.   Return in 2 weeks or sooner for any questions/problems before then.

## 2021-09-17 NOTE — Progress Notes (Signed)
[facility] - HEART FAILURE CLINIC - PHARMACIST COUNSELING NOTE  Guideline-Directed Medical Therapy/Evidence Based Medicine  ACE/ARB/ARNI: Sacubitril-valsartan 49-51 mg twice daily Beta Blocker: Carvedilol 6.25 mg twice daily - pt reports taking once daily Aldosterone Antagonist:  N/A Diuretic: Furosemide 40 mg daily SGLT2i:  N/A  Adherence Assessment  Do you ever forget to take your medication? [x] Yes [] No  Do you ever skip doses due to side effects? [] Yes [x] No  Do you have trouble affording your medicines? [] Yes [x] No  Are you ever unable to pick up your medication due to transportation difficulties? [] Yes [x] No  Do you ever stop taking your medications because you don't believe they are helping? [] Yes [x] No  Do you check your weight daily? [] Yes [x] No    Vital signs: HR 83, BP 168/116, weight (pounds) 287 ECHO: Date 01/13/21, EF 60-65%, severe LVH  BMP Latest Ref Rng & Units 05/10/2021 04/13/2021 04/12/2021  Glucose 65 - 99 mg/dL ) ) )  BUN 6 - 24 mg/dL 24 ) )  Creatinine 0.76 - 1.27 mg/dL ) ) )  BUN/Creat Ratio 9 - 20 12 - -  Sodium 134 - 144 mmol/L 142 138 136  Potassium 3.5 - 5.2 mmol/L 4.0 3.7 3.4(L)  Chloride 96 - 106 mmol/L 105 99 100  CO2 20 - 29 mmol/L 23 27 26   Calcium 8.7 - 10.2 mg/dL 9.3 8.9 01/15/21)    Past Medical History:  Diagnosis Date   Asthma    CHF (congestive heart failure) (HCC)    Diverticulitis    Hypertension     ASSESSMENT 46 year old male who presents to the HF clinic for a follow up visit. I personally did not see the pt but discussed with nurse who had seen him. He reports only taking his carvedilol once daily (prescribed as twice daily) and his hydralazine twice daily (prescribed as three times daily) which had been an issue when I had seen him 01/29/21.  Recent ED Visit (past 6 months): Date - 04/10/21, CC - nausea  PLAN CHF/HTN Discussed with provider changes to pt's current  regimen Continue furosemide 40 mg daily  Change hydralazine 100 mg TID to 100 mg BID Start amlodipine 5 mg daily Change carvedilol 6.25 mg BID to metoprolol succinate 25 mg daily to help with adherence   Savannah Morford O Jesslyn Viglione, Pharm.D. Clinical Pharmacist 09/17/2021 11:03 AM    Current Outpatient Medications:    albuterol (VENTOLIN HFA) 108 (90 Base) MCG/ACT inhaler, Inhale 2 puffs into the lungs every 4 (four) hours as needed for wheezing or shortness of breath. (Patient not taking: Reported on 06/10/2021), Disp: 8 g, Rfl: 1   aspirin EC 81 MG EC tablet, Take 1 tablet (81 mg total) by mouth daily. Swallow whole. (Patient not taking: Reported on 08/01/2021), Disp: 30 tablet, Rfl: 11   carvedilol (COREG) 6.25 MG tablet, Take 1 tablet (6.25 mg total) by mouth 2 (two) times daily with a meal. (Patient taking differently: Take 6.25 mg by mouth 2 (two) times daily with a meal. Patient taking once daily), Disp: 180 tablet, Rfl: 3   furosemide (LASIX) 40 MG tablet, Take 1 tablet (40 mg total) by mouth once daily., Disp: 90 tablet, Rfl: 3   hydrALAZINE (APRESOLINE) 100 MG tablet, Take 1 tablet (100 mg total) by mouth 3 (three) times daily. (Patient taking differently: Take 100 mg by mouth 3 (three) times daily. Patient taking BID), Disp: 270 tablet, Rfl: 3   isosorbide mononitrate (IMDUR) 30 MG 24 hr tablet, Take 1  tablet (30 mg total) by mouth once daily. (Patient not taking: Reported on 08/01/2021), Disp: 30 tablet, Rfl: 2   rosuvastatin (CRESTOR) 20 MG tablet, Take 1 tablet (20 mg total) by mouth once daily. (Patient not taking: Reported on 06/10/2021), Disp: 90 tablet, Rfl: 3   sacubitril-valsartan (ENTRESTO) 49-51 MG, Take 1 tablet by mouth 2 (two) times daily., Disp: 180 tablet, Rfl: 3    DRUGS TO CAUTION IN HEART FAILURE  Drug or Class Mechanism  Analgesics NSAIDs COX-2 inhibitors Glucocorticoids  Sodium and water retention, increased systemic vascular resistance, decreased response to  diuretics   Diabetes Medications Metformin Thiazolidinediones Rosiglitazone (Avandia) Pioglitazone (Actos) DPP4 Inhibitors Saxagliptin (Onglyza) Sitagliptin (Januvia)   Lactic acidosis Possible calcium channel blockade   Unknown  Antiarrhythmics Class I  Flecainide Disopyramide Class III Sotalol Other Dronedarone  Negative inotrope, proarrhythmic   Proarrhythmic, beta blockade  Negative inotrope  Antihypertensives Alpha Blockers Doxazosin Calcium Channel Blockers Diltiazem Verapamil Nifedipine Central Alpha Adrenergics Moxonidine Peripheral Vasodilators Minoxidil  Increases renin and aldosterone  Negative inotrope    Possible sympathetic withdrawal  Unknown  Anti-infective Itraconazole Amphotericin B  Negative inotrope Unknown  Hematologic Anagrelide Cilostazol   Possible inhibition of PD IV Inhibition of PD III causing arrhythmias  Neurologic/Psychiatric Stimulants Anti-Seizure Drugs Carbamazepine Pregabalin Antidepressants Tricyclics Citalopram Parkinsons Bromocriptine Pergolide Pramipexole Antipsychotics Clozapine Antimigraine Ergotamine Methysergide Appetite suppressants Bipolar Lithium  Peripheral alpha and beta agonist activity  Negative inotrope and chronotrope Calcium channel blockade  Negative inotrope, proarrhythmic Dose-dependent QT prolongation  Excessive serotonin activity/valvular damage Excessive serotonin activity/valvular damage Unknown  IgE mediated hypersensitivy, calcium channel blockade  Excessive serotonin activity/valvular damage Excessive serotonin activity/valvular damage Valvular damage  Direct myofibrillar degeneration, adrenergic stimulation  Antimalarials Chloroquine Hydroxychloroquine Intracellular inhibition of lysosomal enzymes  Urologic Agents Alpha Blockers Doxazosin Prazosin Tamsulosin Terazosin  Increased renin and aldosterone  Adapted from Page Williemae Natter, et al. Drugs That May  Cause or Exacerbate Heart Failure: A Scientific Statement from the American Heart  Association. Circulation 2016; 134:e32-e69. DOI: 10.1161/CIR.0000000000000426   MEDICATION ADHERENCES TIPS AND STRATEGIES Taking medication as prescribed improves patient outcomes in heart failure (reduces hospitalizations, improves symptoms, increases survival) Side effects of medications can be managed by decreasing doses, switching agents, stopping drugs, or adding additional therapy. Please let someone in the Heart Failure Clinic know if you have having bothersome side effects so we can modify your regimen. Do not alter your medication regimen without talking to [place].  Medication reminders can help patients remember to take drugs on time. If you are missing or forgetting doses you can try linking behaviors, using pill boxes, or an electronic reminder like an alarm on your phone or an app. Some people can also get automated phone calls as medication reminders.

## 2021-09-17 NOTE — Patient Instructions (Signed)
The Heart Failure Clinic will be moving around the corner to [address] mid-February. Our phone number will remain the same.  Once you pick up your metoprolol take it once/day and STOP taking coreg.  Start amlodipine once/day.  Take Entresto twice/day.   Continue to take your hydralazine as directed on bottle.   Return in two weeks.

## 2021-09-19 ENCOUNTER — Other Ambulatory Visit: Payer: Self-pay

## 2021-10-01 ENCOUNTER — Other Ambulatory Visit: Payer: Self-pay

## 2021-10-01 ENCOUNTER — Encounter: Payer: Self-pay | Admitting: Family

## 2021-10-01 ENCOUNTER — Ambulatory Visit: Payer: Self-pay | Attending: Family | Admitting: Family

## 2021-10-01 VITALS — BP 193/120 | HR 95 | Resp 20 | Ht 66.0 in | Wt 286.0 lb

## 2021-10-01 DIAGNOSIS — Z720 Tobacco use: Secondary | ICD-10-CM

## 2021-10-01 DIAGNOSIS — F1410 Cocaine abuse, uncomplicated: Secondary | ICD-10-CM | POA: Insufficient documentation

## 2021-10-01 DIAGNOSIS — G8929 Other chronic pain: Secondary | ICD-10-CM | POA: Insufficient documentation

## 2021-10-01 DIAGNOSIS — F17210 Nicotine dependence, cigarettes, uncomplicated: Secondary | ICD-10-CM | POA: Insufficient documentation

## 2021-10-01 DIAGNOSIS — I5032 Chronic diastolic (congestive) heart failure: Secondary | ICD-10-CM | POA: Insufficient documentation

## 2021-10-01 DIAGNOSIS — Z79899 Other long term (current) drug therapy: Secondary | ICD-10-CM | POA: Insufficient documentation

## 2021-10-01 DIAGNOSIS — I129 Hypertensive chronic kidney disease with stage 1 through stage 4 chronic kidney disease, or unspecified chronic kidney disease: Secondary | ICD-10-CM

## 2021-10-01 DIAGNOSIS — G479 Sleep disorder, unspecified: Secondary | ICD-10-CM | POA: Insufficient documentation

## 2021-10-01 DIAGNOSIS — N1831 Chronic kidney disease, stage 3a: Secondary | ICD-10-CM

## 2021-10-01 DIAGNOSIS — M5442 Lumbago with sciatica, left side: Secondary | ICD-10-CM

## 2021-10-01 DIAGNOSIS — I130 Hypertensive heart and chronic kidney disease with heart failure and stage 1 through stage 4 chronic kidney disease, or unspecified chronic kidney disease: Secondary | ICD-10-CM | POA: Insufficient documentation

## 2021-10-01 DIAGNOSIS — F1090 Alcohol use, unspecified, uncomplicated: Secondary | ICD-10-CM | POA: Insufficient documentation

## 2021-10-01 DIAGNOSIS — M545 Low back pain, unspecified: Secondary | ICD-10-CM | POA: Insufficient documentation

## 2021-10-01 DIAGNOSIS — N189 Chronic kidney disease, unspecified: Secondary | ICD-10-CM | POA: Insufficient documentation

## 2021-10-01 MED ORDER — AMLODIPINE BESYLATE 10 MG PO TABS: 10.0000 mg | ORAL_TABLET | Freq: Every day | ORAL | 5 refills | Status: DC

## 2021-10-01 NOTE — Patient Instructions (Signed)
The Heart Failure Clinic will be moving around the corner to [address] mid-February. Our phone number will remain the same.  Call Dr. Laural Benes, you need to see her to discuss your back pain!  Keep trying to decrease your tobacco intake.  Keep trying to decrease your alcohol intake, and other recreational drugs.

## 2021-10-01 NOTE — Progress Notes (Signed)
Patient ID: Brad Lynch., male    DOB: 25-Sep-1975, 46 y.o.   MRN: [id number]   Brad [name] is a 46 y/o male with a history of HTN, tobacco use, substance abuse and chronic heart failure.   Echo report from 01/13/21 reviewed and showed an EF of 60-65% along with severe LVH.   Admitted 04/10/21 due to shortness of breath and GI symptoms. Found to be hypertensive. Initially treated with IV lasix and then transitioned to oral diuretics. Troponin elevation thought to be due to demand ischemia. + cocaine use. Discharged after 3 days.   He presents today for a follow-up visit with a chief complaint of minimal fatigue upon moderate exertion. He describes this as chronic in nature having been present for several years. He has associated periods of chest pain, SOB, difficulty sleeping, and chronic back pain along with this. He denies any dizziness, abdominal distention, palpitations, pedal edema, chest pain, cough or weight gain.   Hasn't been weighing daily but does have scales at home. Not adding salt but occasionally eats something salty.   Reports he has been taking all of his medications as prescribed with the exception of today, he did not take any medications prior to his visit (1230pm). BP was elevated 185/111. He had all of his medications with him, advised him to take while in office.   His biggest complaint is lower back pain that radiates down his left leg. He had to miss several days of work again for this. He was taking something of his mothers for pain, but she told him he could not take anymore as it may "damage his kidneys". He reports that pain is relieved with her medication and if he uses cocaine. Advised him strongly to immediately stop cocaine use and the deleterious effects it has on all vital organs.   Past Medical History:  Diagnosis Date   Asthma    CHF (congestive heart failure) (HCC)    Diverticulitis    Hypertension    Past Surgical History:  Procedure Laterality  Date   ANKLE SURGERY     Family History  Problem Relation Age of Onset   Hypertension Mother    Social History   Tobacco Use   Smoking status: Every Day    Packs/day: 0.50    Types: Cigarettes   Smokeless tobacco: Never  Substance Use Topics   Alcohol use: Yes    Comment: occas   No Known Allergies  Prior to Admission medications   Medication Sig Start Date End Date Taking? Authorizing Provider  furosemide (LASIX) 40 MG tablet Take 1 tablet (40 mg total) by mouth once daily. 08/08/21  Yes Clarisa Kindred A, FNP  hydrALAZINE (APRESOLINE) 100 MG tablet Take 1 tablet (100 mg total) by mouth 3 (three) times daily. Patient taking differently: Take 100 mg by mouth 3 (three) times daily. Patient taking BID 08/08/21 11/18/21 Yes Clarisa Kindred A, FNP  metoprolol succinate (TOPROL-XL) 25 MG 24 hr tablet Take 1 tablet (25 mg total) by mouth once daily. Take with or immediately following a meal. 09/17/21 12/23/21 Yes Hackney, Tina A, FNP  sacubitril-valsartan (ENTRESTO) 49-51 MG Take 1 tablet by mouth 2 (two) times daily. 08/08/21  Yes Hackney, Inetta Fermo A, FNP  albuterol (VENTOLIN HFA) 108 (90 Base) MCG/ACT inhaler Inhale 2 puffs into the lungs every 4 (four) hours as needed for wheezing or shortness of breath. Patient not taking: Reported on 06/10/2021 01/13/21   Arnetha Courser, MD  amLODipine (NORVASC) 10 MG tablet  Take 1 tablet (10 mg total) by mouth daily. 10/01/21 12/30/21  Delma Freeze, FNP  aspirin EC 81 MG EC tablet Take 1 tablet (81 mg total) by mouth daily. Swallow whole. Patient not taking: Reported on 08/01/2021 01/14/21   Arnetha Courser, MD  isosorbide mononitrate (IMDUR) 30 MG 24 hr tablet Take 1 tablet (30 mg total) by mouth once daily. Patient not taking: Reported on 08/01/2021 05/23/21   Marcine Matar, MD  rosuvastatin (CRESTOR) 20 MG tablet Take 1 tablet (20 mg total) by mouth once daily. Patient not taking: Reported on 06/10/2021 05/09/21 05/09/22  Delma Freeze, FNP   Review of  Systems  Constitutional:  Positive for fatigue. Negative for appetite change.  HENT:  Negative for congestion, postnasal drip and sore throat.   Eyes: Negative.   Respiratory:  Positive for apnea and shortness of breath. Negative for cough and chest tightness.        + snoring  Cardiovascular:  Positive for chest pain (at times). Negative for palpitations and leg swelling.  Gastrointestinal:  Negative for abdominal distention and abdominal pain.  Endocrine: Negative.   Genitourinary: Negative.   Musculoskeletal:  Positive for back pain (radiates down left leg). Negative for arthralgias and neck pain.  Skin: Negative.   Allergic/Immunologic: Negative.   Neurological:  Negative for dizziness and light-headedness.  Hematological:  Negative for adenopathy. Does not bruise/bleed easily.  Psychiatric/Behavioral:  Positive for dysphoric mood and sleep disturbance (sleeping on 1 pillow). Negative for hallucinations. The patient is not nervous/anxious.    Vitals:   10/01/21 1224  BP: (!) 185/111  Pulse: 95  Resp: 20  SpO2: 97%  Weight: 286 lb (129.7 kg)  Height: 5\' 6"  (1.676 m)   Wt Readings from Last 3 Encounters:  10/01/21 286 lb (129.7 kg)  09/17/21 286 lb (129.7 kg)  08/01/21 286 lb (129.7 kg)   Lab Results  Component Value Date   CREATININE 1.93 (H) 05/10/2021   CREATININE 2.06 (H) 04/13/2021   CREATININE 2.00 (H) 04/12/2021   Physical Exam Vitals and nursing note reviewed.  Constitutional:      General: He is not in acute distress.    Appearance: Normal appearance.  HENT:     Head: Normocephalic and atraumatic.  Cardiovascular:     Rate and Rhythm: Normal rate and regular rhythm.  Pulmonary:     Effort: Pulmonary effort is normal. No respiratory distress.     Breath sounds: No wheezing or rales.  Abdominal:     General: There is no distension.     Palpations: Abdomen is soft.     Tenderness: There is no abdominal tenderness.  Musculoskeletal:        General: No  tenderness.     Cervical back: Normal range of motion and neck supple.     Right lower leg: No edema.     Left lower leg: No edema.  Skin:    General: Skin is warm and dry.  Neurological:     General: No focal deficit present.     Mental Status: He is alert and oriented to person, place, and time.     Motor: No weakness.     Gait: Gait normal.  Psychiatric:        Mood and Affect: Mood normal.        Behavior: Behavior normal.        Thought Content: Thought content normal.    Assessment & Plan:  1: Chronic heart failure with preserved ejection fraction  with structural changes (LVH)- - NYHA class II - euvolemic today - not weighing daily but does have his scales; reminded to call for an overnight weight gain of > 2 pounds or a weekly weight gain of > 5 pounds - weight 286 today, unchanged from last visit - does not add salt to his food; reviewed the importance of reading food labels for sodium content - on metoprolol 25 mg QD - did not show for cardiology appt with Dr. Welton Flakes on 02/04/21 - he's unsure of his fluid intake; explained that he needed to drink ~ 60 ounces of fluid daily - BNP 04/10/21 was 127.7  2: HTN with CKD- - BP 185/111, recheck after 20 minutes and taking his am meds 193/120 - reports he is taking all meds as directed with the exception of not taking them today  - metoprolol 25 mg QD, entresto 49/51 BID, norvasc 5 mg QD, hydralazine 100 TID - will increase norvasc to 10 mg QD - reviewed the importance of taking all his medications correctly as prescribed as his uncontrolled HTN puts him at high risk of a stroke - reports cocaine use to deal with back pain, advised him to immediately stop this  - saw PCP Laural Benes) 05/10/21; has not had a f/u appt for his back pain. Advised him the HF clinic cannot manage his back pain and he needs to f/u with his PCP for treatment options.  - BMP 05/10/21 reviewed and showed sodium 142, potassium 4.0, creatinine 1.93 and GFR 43  3:  Tobacco use- - smoking 3-4 cigarettes daily - drinks alcohol on social occasions only (birthday/ holiday)  4: Cocaine abuse- - uses on occasion to deal with back pain - advised to immediately discontinue use of cocaine and the deleterious effects it has on his organs  5: Back pain- - lower back, radiates down his left leg - was using "something" of his mothers that helped with the pain but she informed him to not take anymore as this would effect his kidney function - missed work over the weekend d/t back pain, requesting missed work note; provided work note but advised cannot provide in the future and he will need to f/u with his PCP for pain management.    Medication bottles reviewed.   Return in 1 month or sooner for any questions/problems before then.

## 2021-10-07 ENCOUNTER — Emergency Department: Payer: Worker's Compensation

## 2021-10-07 ENCOUNTER — Emergency Department
Admission: EM | Admit: 2021-10-07 | Discharge: 2021-10-07 | Disposition: A | Discharge status: Home / Self Care | Payer: Worker's Compensation | Attending: Emergency Medicine | Admitting: Emergency Medicine

## 2021-10-07 ENCOUNTER — Other Ambulatory Visit: Payer: Self-pay

## 2021-10-07 DIAGNOSIS — Z79899 Other long term (current) drug therapy: Secondary | ICD-10-CM | POA: Diagnosis not present

## 2021-10-07 DIAGNOSIS — I5030 Unspecified diastolic (congestive) heart failure: Secondary | ICD-10-CM | POA: Insufficient documentation

## 2021-10-07 DIAGNOSIS — I10 Essential (primary) hypertension: Secondary | ICD-10-CM

## 2021-10-07 DIAGNOSIS — S93431A Sprain of tibiofibular ligament of right ankle, initial encounter: Secondary | ICD-10-CM | POA: Diagnosis not present

## 2021-10-07 DIAGNOSIS — W1809XA Striking against other object with subsequent fall, initial encounter: Secondary | ICD-10-CM | POA: Insufficient documentation

## 2021-10-07 DIAGNOSIS — Y990 Civilian activity done for income or pay: Secondary | ICD-10-CM | POA: Diagnosis not present

## 2021-10-07 DIAGNOSIS — S93492A Sprain of other ligament of left ankle, initial encounter: Secondary | ICD-10-CM

## 2021-10-07 DIAGNOSIS — N189 Chronic kidney disease, unspecified: Secondary | ICD-10-CM | POA: Diagnosis not present

## 2021-10-07 DIAGNOSIS — S99912A Unspecified injury of left ankle, initial encounter: Secondary | ICD-10-CM | POA: Diagnosis present

## 2021-10-07 DIAGNOSIS — I130 Hypertensive heart and chronic kidney disease with heart failure and stage 1 through stage 4 chronic kidney disease, or unspecified chronic kidney disease: Secondary | ICD-10-CM | POA: Insufficient documentation

## 2021-10-07 DIAGNOSIS — M545 Low back pain, unspecified: Secondary | ICD-10-CM

## 2021-10-07 DIAGNOSIS — S30810A Abrasion of lower back and pelvis, initial encounter: Secondary | ICD-10-CM | POA: Diagnosis not present

## 2021-10-07 DIAGNOSIS — Y92512 Supermarket, store or market as the place of occurrence of the external cause: Secondary | ICD-10-CM | POA: Diagnosis not present

## 2021-10-07 MED ORDER — ACETAMINOPHEN 500 MG PO TABS
1000.0000 mg | ORAL_TABLET | Freq: Once | ORAL | Status: AC
  Administered 2021-10-07: 1000 mg via ORAL
  Filled 2021-10-07: qty 2

## 2021-10-07 MED ORDER — METOPROLOL SUCCINATE ER 50 MG PO TB24
25.0000 mg | ORAL_TABLET | Freq: Every day | ORAL | Status: DC
  Administered 2021-10-07: 25 mg via ORAL
  Filled 2021-10-07: qty 1

## 2021-10-07 MED ORDER — AMLODIPINE BESYLATE 5 MG PO TABS
10.0000 mg | ORAL_TABLET | Freq: Every day | ORAL | Status: DC
  Administered 2021-10-07: 10 mg via ORAL
  Filled 2021-10-07: qty 2

## 2021-10-07 MED ORDER — LIDOCAINE 5 % EX PTCH
1.0000 | MEDICATED_PATCH | CUTANEOUS | Status: DC
  Administered 2021-10-07: 1 via TRANSDERMAL
  Filled 2021-10-07: qty 1

## 2021-10-07 MED ORDER — ISOSORBIDE MONONITRATE ER 60 MG PO TB24
30.0000 mg | ORAL_TABLET | Freq: Every day | ORAL | Status: DC
  Administered 2021-10-07: 30 mg via ORAL
  Filled 2021-10-07: qty 1

## 2021-10-07 NOTE — ED Provider Notes (Signed)
[facility] Provider Note    Event Date/Time   First MD Initiated Contact with Patient 10/07/21 [phone]     (approximate)   History   Ankle Pain   HPI  Brad [name]. is a 46 y.o. male with past medical history of diastolic heart failure, HTN and CKD who presents for evaluation of some lower back pain and pain outside of the left ankle that he states began when he was at work and some shopping cart fell off a forklift hitting him in the lower back and left ankle.  This happened 3 days ago patient has been using a cane since then due to some soreness in left ankle.  No subsequent injuries or falls, numbness, incontinence, abdominal pain, fevers or any other acute pain.  No other recent sick symptoms.  States he did not take his blood pressure medicine this morning.  No history of IV drug use, steroids or malignancy or other red flag symptoms.      Physical Exam  Triage Vital Signs: ED Triage Vitals  Enc Vitals Group     BP 10/07/21 0939 (!) 197/128     Pulse Rate 10/07/21 0939 84     Resp 10/07/21 0939 20     Temp 10/07/21 0939 97.6 F (36.4 C)     Temp Source 10/07/21 0939 Oral     SpO2 10/07/21 0939 97 %     Weight --      Height --      Head Circumference --      Peak Flow --      Pain Score 10/07/21 0915 10     Pain Loc --      Pain Edu? --      Excl. in GC? --     Most recent vital signs: Vitals:   10/07/21 0939 10/07/21 1032  BP: (!) 197/128 (!) 160/122  Pulse: 84   Resp: 20   Temp: 97.6 F (36.4 C)   SpO2: 97%     General: Awake, no distress.  CV:  Good peripheral perfusion.  Resp:  Normal effort.  Abd:  No distention.  Other:  Small abrasion over the left paralumbar muscles and some tenderness over the bilateral lumbar muscles and L-spine.  No tenderness or overlying skin changes over the C or T-spine.  Patient has full strength and range of motion throughout the bilateral lower extremities although has some edema and  tenderness over the lateral malleolus of the left ankle.  2+ DP pulses.  Sensation is intact light touch throughout.  No other obvious trauma to lower extremities.   ED Results / Procedures / Treatments  Labs (all labs ordered are listed, but only abnormal results are displayed) Labs Reviewed - No data to display   EKG   RADIOLOGY   Plain films of the L-spine interpreted myself as no acute fracture or dislocation.  I also reviewed radiologist interpretation and agree with the findings with additional notation of some degenerative changes.  Plain films left ankle interpreted and reviewed by myself shows no acute fracture dislocation.  Also reviewed radiologist interpretation and agree with their findings.  PROCEDURES:     MEDICATIONS ORDERED IN ED: Medications  lidocaine (LIDODERM) 5 % 1 patch (1 patch Transdermal Patch Applied 10/07/21 1036)  amLODipine (NORVASC) tablet 10 mg (10 mg Oral Given 10/07/21 1033)  isosorbide mononitrate (IMDUR) 24 hr tablet 30 mg (30 mg Oral Given 10/07/21 1032)  metoprolol succinate (TOPROL-XL) 24 hr tablet  25 mg (25 mg Oral Given 10/07/21 1032)  acetaminophen (TYLENOL) tablet 1,000 mg (1,000 mg Oral Given 10/07/21 1031)     IMPRESSION / MDM / ASSESSMENT AND PLAN / ED COURSE  I reviewed the triage vital signs and the nursing notes.                              Differential diagnosis includes, but is not limited to muscle strain versus contusion versus pathologic fracture and possible sciatic pain from herniated disc in the lower spine.  Regards to the left ankle concern for possible contusion versus strain versus underlying ankle fracture.  He is otherwise neurovascular intact without any other pain on history or tenderness on exam.  No symptoms or history findings at this time to suggest acute infectious process or spinal cord compression.  Low suspicion for any compartment syndrome in the ankle as it is fairly soft.  Plain films of the L-spine  interpreted myself as no acute fracture or dislocation.  I also reviewed radiologist interpretation and agree with the findings with additional notation of some degenerative changes.  Plain films left ankle interpreted and reviewed by myself shows no acute fracture dislocation.  Also reviewed radiologist interpretation and agree with their findings.  Suspect contusion and possible muscle strain in the lower back and ankle sprain.  Will place in cam walking boot.  Discussed using Tylenol rest ice and elevation for the ankle and following up with PCP for any persistent symptoms in addition to having his blood pressure rechecked.  He is amenable with plan.  Discharged in stable condition.  Strict precautions advised and discussed      FINAL CLINICAL IMPRESSION(S) / ED DIAGNOSES   Final diagnoses:  Sprain of anterior talofibular ligament of left ankle, initial encounter  Acute left-sided low back pain without sciatica  Hypertension, unspecified type     Rx / DC Orders   ED Discharge Orders     None        Note:  This document was prepared using Dragon voice recognition software and may include unintentional dictation errors.   Gilles Chiquito, MD 10/07/21 1040

## 2021-10-07 NOTE — ED Triage Notes (Signed)
Pt comes with c/o left ankle and back pain. Pt states pain in foot.. pt states this happened at work. Pt wishes to file for workers comp.

## 2021-10-14 ENCOUNTER — Ambulatory Visit: Payer: Self-pay | Admitting: Internal Medicine

## 2021-10-29 ENCOUNTER — Other Ambulatory Visit: Payer: Self-pay

## 2021-10-29 ENCOUNTER — Encounter: Payer: Self-pay | Admitting: Family

## 2021-10-29 ENCOUNTER — Ambulatory Visit: Payer: Self-pay | Attending: Family | Admitting: Family

## 2021-10-29 VITALS — BP 165/104 | HR 89 | Resp 20 | Ht 66.0 in | Wt 285.0 lb

## 2021-10-29 DIAGNOSIS — I129 Hypertensive chronic kidney disease with stage 1 through stage 4 chronic kidney disease, or unspecified chronic kidney disease: Secondary | ICD-10-CM

## 2021-10-29 DIAGNOSIS — F17210 Nicotine dependence, cigarettes, uncomplicated: Secondary | ICD-10-CM | POA: Insufficient documentation

## 2021-10-29 DIAGNOSIS — G479 Sleep disorder, unspecified: Secondary | ICD-10-CM | POA: Insufficient documentation

## 2021-10-29 DIAGNOSIS — X58XXXD Exposure to other specified factors, subsequent encounter: Secondary | ICD-10-CM | POA: Insufficient documentation

## 2021-10-29 DIAGNOSIS — M545 Low back pain, unspecified: Secondary | ICD-10-CM | POA: Insufficient documentation

## 2021-10-29 DIAGNOSIS — G8929 Other chronic pain: Secondary | ICD-10-CM | POA: Insufficient documentation

## 2021-10-29 DIAGNOSIS — S93402D Sprain of unspecified ligament of left ankle, subsequent encounter: Secondary | ICD-10-CM | POA: Insufficient documentation

## 2021-10-29 DIAGNOSIS — I130 Hypertensive heart and chronic kidney disease with heart failure and stage 1 through stage 4 chronic kidney disease, or unspecified chronic kidney disease: Secondary | ICD-10-CM | POA: Insufficient documentation

## 2021-10-29 DIAGNOSIS — Z760 Encounter for issue of repeat prescription: Secondary | ICD-10-CM | POA: Insufficient documentation

## 2021-10-29 DIAGNOSIS — N1831 Chronic kidney disease, stage 3a: Secondary | ICD-10-CM

## 2021-10-29 DIAGNOSIS — F1410 Cocaine abuse, uncomplicated: Secondary | ICD-10-CM | POA: Insufficient documentation

## 2021-10-29 DIAGNOSIS — Z720 Tobacco use: Secondary | ICD-10-CM

## 2021-10-29 DIAGNOSIS — Y990 Civilian activity done for income or pay: Secondary | ICD-10-CM | POA: Insufficient documentation

## 2021-10-29 DIAGNOSIS — M5442 Lumbago with sciatica, left side: Secondary | ICD-10-CM

## 2021-10-29 DIAGNOSIS — I5032 Chronic diastolic (congestive) heart failure: Secondary | ICD-10-CM | POA: Insufficient documentation

## 2021-10-29 DIAGNOSIS — Z79899 Other long term (current) drug therapy: Secondary | ICD-10-CM | POA: Insufficient documentation

## 2021-10-29 MED ORDER — HYDRALAZINE HCL 100 MG PO TABS
100.0000 mg | ORAL_TABLET | Freq: Three times a day (TID) | ORAL | 3 refills | Status: DC
  Filled 2021-10-29: qty 270, 90d supply, fill #0

## 2021-10-29 MED ORDER — FUROSEMIDE 40 MG PO TABS
40.0000 mg | ORAL_TABLET | Freq: Every day | ORAL | 3 refills | Status: DC
  Filled 2021-10-29: qty 90, 90d supply, fill #0

## 2021-10-29 MED ORDER — SACUBITRIL-VALSARTAN 97-103 MG PO TABS
1.0000 | ORAL_TABLET | Freq: Two times a day (BID) | ORAL | 3 refills | Status: DC
  Filled 2021-10-29: qty 180, 90d supply, fill #0

## 2021-10-29 NOTE — Patient Instructions (Signed)
You are doing good with taking your meds as prescribed!!! ? ?If you have voicemail, please make sure your mailbox is cleaned out so that we may leave a message and please make sure to listen to any voicemails.   ? ?Resume taking lasix once/day (the one you were out of). ? ?When you pick up your new entresto bottle, it will be at a higher dose when you pick up the new bottle. ? ?Return in 1 month.  ?

## 2021-10-29 NOTE — Progress Notes (Signed)
? Patient ID: Brad Lynch., male    DOB: 1976/04/28, 46 y.o.   MRN: [id number] ? ? ?Brad [name] is a 46 y/o male with a history of HTN, tobacco use, substance abuse and chronic heart failure.  ? ?Echo report from 01/13/21 reviewed and showed an EF of 60-65% along with severe LVH.  ? ?Was in the ED 10/07/21 due to left ankle sprain and low back pain after injury at work. Evaluated and released.   ? ?He presents today for a follow-up visit with a chief complaint of minimal fatigue upon moderate exertion. He describes this as chronic in nature having been present for several years. He has associated periods of chest pain, SOB, difficulty sleeping, and chronic back pain along with this. He denies any dizziness, abdominal distention, palpitations, pedal edema, chest pain, cough or weight gain.  ? ?Resumed weighing daily since his last visit 1 month ago, weights are stable 280-285. ? ?He has been taking his hydralazine, metoprolol, and entresto as prescribed. He has been out of his lasix ~ 6 weeks.  ? ?His biggest complaint is lower back pain that radiates down his left leg r/t an injury that occurred at work. He has begun the process to proceed through [employer] and has a f/u appt on 3/10 to see when his MRI will be scheduled. ? ?Past Medical History:  ?Diagnosis Date  ? Asthma   ? CHF (congestive heart failure) (HCC)   ? Diverticulitis   ? Hypertension   ? ?Past Surgical History:  ?Procedure Laterality Date  ? ANKLE SURGERY    ? ?Family History  ?Problem Relation Age of Onset  ? Hypertension Mother   ? ?Social History  ? ?Tobacco Use  ? Smoking status: Every Day  ?  Packs/day: 0.50  ?  Types: Cigarettes  ? Smokeless tobacco: Never  ?Substance Use Topics  ? Alcohol use: Yes  ?  Comment: occas  ? ?No Known Allergies ? ?Prior to Admission medications   ?Medication Sig Start Date End Date Taking? Authorizing Provider  ?amLODipine (NORVASC) 10 MG tablet Take 1 tablet (10 mg total) by mouth daily. 10/01/21 12/30/21 Yes  Delma Freeze, FNP  ?metoprolol succinate (TOPROL-XL) 25 MG 24 hr tablet Take 1 tablet (25 mg total) by mouth once daily. Take with or immediately following a meal. 09/17/21 12/23/21 Yes Delma Freeze, FNP  ?sacubitril-valsartan (ENTRESTO) 97-103 MG Take 1 tablet by mouth 2 (two) times daily. 10/29/21  Yes Clarisa Kindred A, FNP  ?albuterol (VENTOLIN HFA) 108 (90 Base) MCG/ACT inhaler Inhale 2 puffs into the lungs every 4 (four) hours as needed for wheezing or shortness of breath. ?Patient not taking: Reported on 06/10/2021 01/13/21   Arnetha Courser, MD  ?aspirin EC 81 MG EC tablet Take 1 tablet (81 mg total) by mouth daily. Swallow whole. ?Patient not taking: Reported on 08/01/2021 01/14/21   Arnetha Courser, MD  ?furosemide (LASIX) 40 MG tablet Take 1 tablet (40 mg total) by mouth once daily. 10/29/21   Delma Freeze, FNP  ?hydrALAZINE (APRESOLINE) 100 MG tablet Take 1 tablet (100 mg total) by mouth 3 (three) times daily. 10/29/21 01/27/22  Delma Freeze, FNP  ?isosorbide mononitrate (IMDUR) 30 MG 24 hr tablet Take 1 tablet (30 mg total) by mouth once daily. ?Patient not taking: Reported on 10/29/2021 05/23/21   Marcine Matar, MD  ?rosuvastatin (CRESTOR) 20 MG tablet Take 1 tablet (20 mg total) by mouth once daily. ?Patient not taking: Reported on 06/10/2021  05/09/21 05/09/22  Delma Freeze, FNP  ? ? ?Review of Systems  ?Constitutional:  Positive for fatigue. Negative for appetite change.  ?HENT:  Negative for congestion, postnasal drip and sore throat.   ?Eyes: Negative.   ?Respiratory:  Positive for shortness of breath. Negative for apnea, cough and chest tightness.   ?     + snoring  ?Cardiovascular:  Positive for chest pain (at times). Negative for palpitations and leg swelling.  ?Gastrointestinal:  Negative for abdominal distention and abdominal pain.  ?Endocrine: Negative.   ?Genitourinary: Negative.   ?Musculoskeletal:  Positive for back pain (radiates down left leg). Negative for arthralgias and neck pain.  ?Skin:  Negative.   ?Allergic/Immunologic: Negative.   ?Neurological:  Negative for dizziness and light-headedness.  ?Hematological:  Negative for adenopathy. Does not bruise/bleed easily.  ?Psychiatric/Behavioral:  Positive for sleep disturbance (sleeping on 1 pillow). Negative for dysphoric mood and hallucinations. The patient is not nervous/anxious.   ? ?Vitals:  ? 10/29/21 1129  ?BP: (!) 165/104  ?Pulse: 89  ?Resp: 20  ?SpO2: 99%  ?Weight: 285 lb (129.3 kg)  ?Height: 5\' 6"  (1.676 m)  ? ?Wt Readings from Last 3 Encounters:  ?10/29/21 285 lb (129.3 kg)  ?10/07/21 284 lb 6.3 oz (129 kg)  ?10/01/21 286 lb (129.7 kg)  ? ?Lab Results  ?Component Value Date  ? CREATININE 1.93 (H) 05/10/2021  ? CREATININE 2.06 (H) 04/13/2021  ? CREATININE 2.00 (H) 04/12/2021  ? ?Physical Exam ?Vitals and nursing note reviewed.  ?Constitutional:   ?   General: He is not in acute distress. ?   Appearance: Normal appearance.  ?HENT:  ?   Head: Normocephalic and atraumatic.  ?Cardiovascular:  ?   Rate and Rhythm: Normal rate and regular rhythm.  ?Pulmonary:  ?   Effort: Pulmonary effort is normal. No respiratory distress.  ?   Breath sounds: No wheezing or rales.  ?Abdominal:  ?   General: There is no distension.  ?   Palpations: Abdomen is soft.  ?   Tenderness: There is no abdominal tenderness.  ?Musculoskeletal:     ?   General: No tenderness.  ?   Cervical back: Normal range of motion and neck supple.  ?   Right lower leg: No edema.  ?   Left lower leg: No edema.  ?Skin: ?   General: Skin is warm and dry.  ?Neurological:  ?   General: No focal deficit present.  ?   Mental Status: He is alert and oriented to person, place, and time.  ?   Motor: No weakness.  ?   Gait: Gait normal.  ?Psychiatric:     ?   Mood and Affect: Mood normal.     ?   Behavior: Behavior normal.     ?   Thought Content: Thought content normal.  ?  ?Assessment & Plan: ? 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 Welton Flakes on 02/04/21 ?- he's unsure of his fluid intake; explained that he needed to drink ~ 60 ounces of fluid daily ?- BNP 04/10/21 was 127.7 ? ?2: HTN with CKD- ?- BP 164/104, improved from last visit ?- reports he is taking all meds as directed with the exception of lasix which he ran out of ?- metoprolol 25 mg QD, entresto 49/51 BID, norvasc 10 mg QD, hydralazine 100 TID ?- will increase entresto once he takes all of current bottle ?- reviewed the importance of taking all his medications correctly as prescribed as his uncontrolled HTN puts him at high risk of a stroke ?- saw PCP Laural Benes) 05/10/21 ?- BMP 05/10/21 reviewed and showed sodium 142, potassium 4.0, creatinine 1.93 and GFR 43 ? ?3: Tobacco use- ?- smoking 3-4 cigarettes daily ? ?4: Cocaine abuse- ?- uses on occasion to deal with back pain; he has cut down his usage of this ?- he understands the negative effects this has on his health  ?- encouraged complete cessation of cocaine and the deleterious effects it has on his organs ? ?5: Back pain- ?- lower back, radiates down his left leg ?- following up with urgent care, who is helping him with workmans comp procedures; returns 11/01/21 to determine when he will have MRI ? ? ?Medication bottles reviewed.  ? ?Return in 1 month or sooner for any questions/problems before then.  ? ? ? ? ?

## 2021-10-30 ENCOUNTER — Other Ambulatory Visit: Payer: Self-pay

## 2021-10-30 ENCOUNTER — Telehealth: Payer: Self-pay | Admitting: Pharmacist

## 2021-10-30 NOTE — Telephone Encounter (Signed)
10/30/2021 11:21:00 AM - Sherryll Burger dose increase forms to dr ?-- Tarry Kos - Wednesday, October 30, 2021 11:17 AM --  ?Received printout from pharmacy for Entresto 97-103 DOSE INCREASE. Forms mailed to Clarisa Kindred, NP ?

## 2021-10-31 ENCOUNTER — Other Ambulatory Visit: Payer: Self-pay

## 2021-11-12 ENCOUNTER — Telehealth: Payer: Self-pay | Admitting: Pharmacist

## 2021-11-12 NOTE — Telephone Encounter (Signed)
11/12/2021 10:21:15 AM - Sherryll Burger faxed to [facility] ?-- Tarry Kos - Tuesday, November 12, 2021 10:19 AM --  ?Faxed Entresto 97/103mg  (DOSE INCREASE) to [facility]. ? ?

## 2021-11-29 ENCOUNTER — Ambulatory Visit: Payer: Self-pay | Admitting: Family

## 2021-12-04 ENCOUNTER — Encounter: Payer: Self-pay | Admitting: Family

## 2021-12-04 ENCOUNTER — Ambulatory Visit: Payer: Self-pay | Attending: Family | Admitting: Family

## 2021-12-04 VITALS — BP 185/118 | HR 96 | Resp 16 | Ht 66.0 in | Wt 283.0 lb

## 2021-12-04 DIAGNOSIS — N189 Chronic kidney disease, unspecified: Secondary | ICD-10-CM | POA: Insufficient documentation

## 2021-12-04 DIAGNOSIS — M549 Dorsalgia, unspecified: Secondary | ICD-10-CM | POA: Insufficient documentation

## 2021-12-04 DIAGNOSIS — N1831 Chronic kidney disease, stage 3a: Secondary | ICD-10-CM

## 2021-12-04 DIAGNOSIS — Z79899 Other long term (current) drug therapy: Secondary | ICD-10-CM | POA: Insufficient documentation

## 2021-12-04 DIAGNOSIS — I5032 Chronic diastolic (congestive) heart failure: Secondary | ICD-10-CM

## 2021-12-04 DIAGNOSIS — I129 Hypertensive chronic kidney disease with stage 1 through stage 4 chronic kidney disease, or unspecified chronic kidney disease: Secondary | ICD-10-CM

## 2021-12-04 DIAGNOSIS — M5442 Lumbago with sciatica, left side: Secondary | ICD-10-CM

## 2021-12-04 DIAGNOSIS — F17210 Nicotine dependence, cigarettes, uncomplicated: Secondary | ICD-10-CM | POA: Insufficient documentation

## 2021-12-04 DIAGNOSIS — R0602 Shortness of breath: Secondary | ICD-10-CM | POA: Insufficient documentation

## 2021-12-04 DIAGNOSIS — F1410 Cocaine abuse, uncomplicated: Secondary | ICD-10-CM

## 2021-12-04 DIAGNOSIS — Z720 Tobacco use: Secondary | ICD-10-CM

## 2021-12-04 DIAGNOSIS — G8929 Other chronic pain: Secondary | ICD-10-CM

## 2021-12-04 DIAGNOSIS — R5383 Other fatigue: Secondary | ICD-10-CM | POA: Insufficient documentation

## 2021-12-04 DIAGNOSIS — I130 Hypertensive heart and chronic kidney disease with heart failure and stage 1 through stage 4 chronic kidney disease, or unspecified chronic kidney disease: Secondary | ICD-10-CM | POA: Insufficient documentation

## 2021-12-04 NOTE — Progress Notes (Signed)
? Patient ID: Brad Lynch., male    DOB: 1976/07/26, 46 y.o.   MRN: [id number] ? ? ?Brad [name] is a 46 y/o male with a history of HTN, tobacco use, substance abuse and chronic heart failure.  ? ?Echo report from 01/13/21 reviewed and showed an EF of 60-65% along with severe LVH.  ? ?Was in the ED 10/07/21 due to left ankle sprain and low back pain after injury at work. Evaluated and released.   ? ?He presents today for a follow-up visit with a chief complaint of minimal fatigue upon moderate exertion. Describes this as chronic in nature. Has associated shortness of breath, difficulty sleeping and back pain along with this. He denies any dizziness, abdominal distention, palpitations, pedal edema, chest pain, cough or weight gain.  ? ?Says that his biggest complaint is of this severe back pain with radiation down his left leg. Has upcoming MRI next week. Admits to doing more cocaine than usual because "it's the only thing that helps with the pain".  ? ?Has only been taking his entresto as 1 tablet daily and taking his hydralazine twice daily. Has not taken any of his medications yet today.  ? ?Past Medical History:  ?Diagnosis Date  ? Asthma   ? CHF (congestive heart failure) (HCC)   ? Diverticulitis   ? Hypertension   ? ?Past Surgical History:  ?Procedure Laterality Date  ? ANKLE SURGERY    ? ?Family History  ?Problem Relation Age of Onset  ? Hypertension Mother   ? ?Social History  ? ?Tobacco Use  ? Smoking status: Every Day  ?  Packs/day: 0.50  ?  Types: Cigarettes  ? Smokeless tobacco: Never  ?Substance Use Topics  ? Alcohol use: Yes  ?  Comment: occas  ? ?No Known Allergies ? ?Prior to Admission medications   ?Medication Sig Start Date End Date Taking? Authorizing Provider  ?amLODipine (NORVASC) 10 MG tablet Take 1 tablet (10 mg total) by mouth daily. 10/01/21 12/30/21 Yes Delma Freeze, FNP  ?furosemide (LASIX) 40 MG tablet Take 1 tablet (40 mg total) by mouth once daily. 10/29/21  Yes Delma Freeze, FNP   ?hydrALAZINE (APRESOLINE) 100 MG tablet Take 1 tablet (100 mg total) by mouth 3 (three) times daily. 10/29/21 01/27/22 Yes Delma Freeze, FNP  ?metoprolol succinate (TOPROL-XL) 25 MG 24 hr tablet Take 1 tablet (25 mg total) by mouth once daily. Take with or immediately following a meal. 09/17/21 12/23/21 Yes Delma Freeze, FNP  ?sacubitril-valsartan (ENTRESTO) 49-51 MG Take 1 tablet by mouth daily.   Yes [provider]  ?albuterol (VENTOLIN HFA) 108 (90 Base) MCG/ACT inhaler Inhale 2 puffs into the lungs every 4 (four) hours as needed for wheezing or shortness of breath. ?Patient not taking: Reported on 06/10/2021 01/13/21   Arnetha Courser, MD  ?aspirin EC 81 MG EC tablet Take 1 tablet (81 mg total) by mouth daily. Swallow whole. ?Patient not taking: Reported on 08/01/2021 01/14/21   Arnetha Courser, MD  ?isosorbide mononitrate (IMDUR) 30 MG 24 hr tablet Take 1 tablet (30 mg total) by mouth once daily. ?Patient not taking: Reported on 10/29/2021 05/23/21   Marcine Matar, MD  ?rosuvastatin (CRESTOR) 20 MG tablet Take 1 tablet (20 mg total) by mouth once daily. ?Patient not taking: Reported on 06/10/2021 05/09/21 05/09/22  Delma Freeze, FNP  ? ?Review of Systems  ?Constitutional:  Positive for fatigue. Negative for appetite change.  ?HENT:  Negative for congestion, postnasal drip and  sore throat.   ?Eyes: Negative.   ?Respiratory:  Positive for shortness of breath. Negative for apnea, cough and chest tightness.   ?     + snoring  ?Cardiovascular:  Negative for chest pain, palpitations and leg swelling.  ?Gastrointestinal:  Negative for abdominal distention and abdominal pain.  ?Endocrine: Negative.   ?Genitourinary: Negative.   ?Musculoskeletal:  Positive for arthralgias (left leg) and back pain (radiates down left leg). Negative for neck pain.  ?Skin: Negative.   ?Allergic/Immunologic: Negative.   ?Neurological:  Negative for dizziness and light-headedness.  ?Hematological:  Negative for adenopathy. Does not  bruise/bleed easily.  ?Psychiatric/Behavioral:  Positive for sleep disturbance (sleeping on 1 pillow). Negative for dysphoric mood. The patient is not nervous/anxious.   ? ?Vitals:  ? 12/04/21 1327  ?BP: (!) 189/106  ?Pulse: 96  ?Resp: 16  ?SpO2: 96%  ?Weight: 283 lb (128.4 kg)  ?Height: 5\' 6"  (1.676 m)  ? ?Wt Readings from Last 3 Encounters:  ?12/04/21 283 lb (128.4 kg)  ?10/29/21 285 lb (129.3 kg)  ?10/07/21 284 lb 6.3 oz (129 kg)  ? ?Lab Results  ?Component Value Date  ? CREATININE 1.93 (H) 05/10/2021  ? CREATININE 2.06 (H) 04/13/2021  ? CREATININE 2.00 (H) 04/12/2021  ? ?Physical Exam ?Vitals and nursing note reviewed.  ?Constitutional:   ?   General: He is not in acute distress. ?   Appearance: Normal appearance.  ?HENT:  ?   Head: Normocephalic and atraumatic.  ?Cardiovascular:  ?   Rate and Rhythm: Normal rate and regular rhythm.  ?Pulmonary:  ?   Effort: Pulmonary effort is normal. No respiratory distress.  ?   Breath sounds: No wheezing or rales.  ?Abdominal:  ?   General: There is no distension.  ?   Palpations: Abdomen is soft.  ?   Tenderness: There is no abdominal tenderness.  ?Musculoskeletal:     ?   General: No tenderness.  ?   Cervical back: Normal range of motion and neck supple.  ?   Right lower leg: No edema.  ?   Left lower leg: No edema.  ?Skin: ?   General: Skin is warm and dry.  ?Neurological:  ?   General: No focal deficit present.  ?   Mental Status: He is alert and oriented to person, place, and time.  ?   Motor: No weakness.  ?   Gait: Gait normal.  ?Psychiatric:     ?   Mood and Affect: Mood normal.     ?   Behavior: Behavior normal.     ?   Thought Content: Thought content normal.  ?  ?Assessment & Plan: ? 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)- ?- NYHA class II ?- euvolemic today ?- weighing daily; reminded to call for an overnight weight gain of > 2 pounds or a weekly weight gain of > 5 pounds ?- weight down 2 pounds from last visit  here 1 month ago ?- does not add salt to his food; reviewed the importance of reading food labels for sodium content ?- on GDMT of entresto although has only been taking it once daily; emphasized that he must take this twice daily ?- he has the 49/51mg  bottle of entresto with him so unclear if he has the 97/103mg  dose ?- check BMP next visit ?- would like to add SGLT2 ?- did not show for cardiology appt with Dr. 04/14/2021 on 02/04/21 ?- BNP 04/10/21 was 127.7 ? ?2:  HTN with CKD- ?- BP elevated (189/106) but hasn't taken any of his medications yet today (~2pm) and has been taking entresto once daily ?- emphasized to take entresto BID and make sure he takes hydralazine BID ?- saw PCP Laural Benes([facility]) 05/10/21; returns 12/24/21 ?- BMP 05/10/21 reviewed and showed sodium 142, potassium 4.0, creatinine 1.93 and GFR 43 ? ?3: Tobacco use- ?- smoking 3-4 cigarettes daily ? ?4: Cocaine abuse- ?- has been using this more often lately due to severe back pain ?- says that "no one will treat the pain so I'm using the cocaine for relief" ?- explained that a provider may not want to prescribe narcotics if he's actively using cocaine ? ?5: Back pain- ?- lower back, radiates down his left leg ?- says that he has a MRI next week ? ? ?Medication bottles reviewed although he says that he has some other bottles at home.  ? ?Return in 6 weeks, sooner if needed ? ? ? ? ?

## 2021-12-04 NOTE — Patient Instructions (Addendum)
Continue weighing daily and call for an overnight weight gain of 3 pounds or more or a weekly weight gain of more than 5 pounds. ? ? ?If you have voicemail, please make sure your mailbox is cleaned out so that we may leave a message and please make sure to listen to any voicemails.  ? ? ?You must take entresto as 1 tablet in the morning and 1 tablet in the evening.  ? ? ?Go pick up medications at [facility]. I sent prescription for your hydralazine there at last visit ? ? ?

## 2021-12-16 ENCOUNTER — Ambulatory Visit: Payer: Self-pay | Admitting: Pharmacy Technician

## 2021-12-16 DIAGNOSIS — Z79899 Other long term (current) drug therapy: Secondary | ICD-10-CM

## 2021-12-16 NOTE — Progress Notes (Signed)
Received updated proof of income.  Patient eligible to receive medication assistance at [facility] until time for re-certification in 4462, and as long as eligibility requirements continue to be met. ? ?Brad [name] ?Care Manager ?[facility]  ?Brad [name] ?Care Manager ?[facility] ?

## 2021-12-20 ENCOUNTER — Other Ambulatory Visit: Payer: Self-pay | Admitting: Family

## 2021-12-20 ENCOUNTER — Other Ambulatory Visit: Payer: Self-pay

## 2021-12-20 MED ORDER — SACUBITRIL-VALSARTAN 49-51 MG PO TABS
1.0000 | ORAL_TABLET | Freq: Two times a day (BID) | ORAL | 3 refills | Status: DC
  Filled 2021-12-20 – 2021-12-30 (×2): qty 180, 90d supply, fill #0

## 2021-12-20 MED ORDER — FUROSEMIDE 40 MG PO TABS
40.0000 mg | ORAL_TABLET | Freq: Every day | ORAL | 3 refills | Status: DC
  Filled 2021-12-20 – 2022-04-16 (×2): qty 90, 90d supply, fill #0

## 2021-12-20 MED ORDER — AMLODIPINE BESYLATE 10 MG PO TABS
10.0000 mg | ORAL_TABLET | Freq: Every day | ORAL | 3 refills | Status: DC
  Filled 2021-12-20: qty 30, 30d supply, fill #0

## 2021-12-20 MED ORDER — METOPROLOL SUCCINATE ER 25 MG PO TB24
25.0000 mg | ORAL_TABLET | Freq: Every day | ORAL | 3 refills | Status: DC
  Filled 2021-12-20: qty 30, 30d supply, fill #0

## 2021-12-20 MED ORDER — HYDRALAZINE HCL 100 MG PO TABS
100.0000 mg | ORAL_TABLET | Freq: Three times a day (TID) | ORAL | 3 refills | Status: DC
  Filled 2021-12-20 – 2022-04-16 (×2): qty 90, 30d supply, fill #0

## 2021-12-23 ENCOUNTER — Other Ambulatory Visit: Payer: Self-pay

## 2021-12-24 ENCOUNTER — Ambulatory Visit: Payer: Self-pay | Admitting: Internal Medicine

## 2021-12-27 ENCOUNTER — Other Ambulatory Visit: Payer: Self-pay

## 2021-12-30 ENCOUNTER — Other Ambulatory Visit: Payer: Self-pay

## 2021-12-30 ENCOUNTER — Other Ambulatory Visit: Payer: Self-pay | Admitting: Family

## 2021-12-30 MED ORDER — SACUBITRIL-VALSARTAN 97-103 MG PO TABS
1.0000 | ORAL_TABLET | Freq: Two times a day (BID) | ORAL | 3 refills | Status: DC
  Filled 2021-12-30 (×2): qty 180, 90d supply, fill #0

## 2021-12-30 NOTE — Progress Notes (Signed)
New RX for entresto 97/103mg  [facility] ?

## 2021-12-31 NOTE — Telephone Encounter (Signed)
Not available per mother lm to call office  ?

## 2022-01-01 ENCOUNTER — Other Ambulatory Visit: Payer: Self-pay

## 2022-01-06 ENCOUNTER — Other Ambulatory Visit: Payer: Self-pay

## 2022-01-20 NOTE — Progress Notes (Unsigned)
Patient ID: Brad Lynch., male    DOB: March 28, 1976, 46 y.o.   MRN: [id number]   Brad [name] is a 46 y/o male with a history of HTN, tobacco use, substance abuse and chronic heart failure.   Echo report from 01/13/21 reviewed and showed an EF of 60-65% along with severe LVH.   Was in the ED 10/07/21 due to left ankle sprain and low back pain after injury at work. Evaluated and released.    He presents today for a follow-up visit with a chief complaint of minimal fatigue upon moderate exertion. Describes this as chronic in nature. He has shortness of breath and chronic pain along with this. He denies any dizziness, difficulty sleeping, abdominal distention, palpitations, pedal edema, chest pain, cough or weight gain.   Has been taking entresto 49/51 and 97/103mg  BID.  Hasn't been weighing himself daily but does have working scales.   Past Medical History:  Diagnosis Date   Asthma    CHF (congestive heart failure) (HCC)    Diverticulitis    Hypertension    Past Surgical History:  Procedure Laterality Date   ANKLE SURGERY     Family History  Problem Relation Age of Onset   Hypertension Mother    Social History   Tobacco Use   Smoking status: Every Day    Packs/day: 0.50    Types: Cigarettes   Smokeless tobacco: Never  Substance Use Topics   Alcohol use: Yes    Comment: occas   No Known Allergies  Prior to Admission medications   Medication Sig Start Date End Date Taking? Authorizing Provider  albuterol (VENTOLIN HFA) 108 (90 Base) MCG/ACT inhaler Inhale 2 puffs into the lungs every 4 (four) hours as needed for wheezing or shortness of breath. 01/13/21  Yes Arnetha Courser, MD  amLODipine (NORVASC) 10 MG tablet Take 1 tablet (10 mg total) by mouth once daily. 12/20/21  Yes Clarisa Kindred A, FNP  aspirin EC 81 MG EC tablet Take 1 tablet (81 mg total) by mouth daily. Swallow whole. 01/14/21  Yes Arnetha Courser, MD  furosemide (LASIX) 40 MG tablet Take 1 tablet (40 mg total) by  mouth once daily. 12/20/21  Yes Clarisa Kindred A, FNP  hydrALAZINE (APRESOLINE) 100 MG tablet Take 1 tablet (100 mg total) by mouth 3 (three) times daily. 12/20/21  Yes Clarisa Kindred A, FNP  metoprolol succinate (TOPROL-XL) 25 MG 24 hr tablet Take 1 tablet (25 mg total) by mouth once daily. Take with or immediately following a meal. Patient taking differently: Take 50 mg by mouth daily. 12/20/21  Yes Hoyt Leanos A, FNP  sacubitril-valsartan (ENTRESTO) 97-103 MG Take 1 tablet by mouth 2 (two) times daily. 12/30/21  Yes Clarisa Kindred A, FNP  rosuvastatin (CRESTOR) 20 MG tablet Take 1 tablet (20 mg total) by mouth once daily. Patient not taking: Reported on 06/10/2021 05/09/21 05/09/22  Delma Freeze, FNP    Review of Systems  Constitutional:  Positive for fatigue. Negative for appetite change.  HENT:  Negative for congestion, postnasal drip and sore throat.   Eyes: Negative.   Respiratory:  Positive for shortness of breath. Negative for apnea, cough and chest tightness.        + snoring  Cardiovascular:  Negative for chest pain, palpitations and leg swelling.  Gastrointestinal:  Negative for abdominal distention and abdominal pain.  Endocrine: Negative.   Genitourinary: Negative.   Musculoskeletal:  Positive for arthralgias (left leg) and back pain (radiates down left leg). Negative  for neck pain.  Skin: Negative.   Allergic/Immunologic: Negative.   Neurological:  Negative for dizziness and light-headedness.  Hematological:  Negative for adenopathy. Does not bruise/bleed easily.  Psychiatric/Behavioral:  Negative for dysphoric mood and sleep disturbance (sleeping on 1 pillow). The patient is not nervous/anxious.    Vitals:   01/21/22 1322  BP: (!) 188/111  Pulse: 87  Resp: 20  SpO2: 97%  Weight: 276 lb 2 oz (125.2 kg)  Height: 5\' 6"  (1.676 m)   Wt Readings from Last 3 Encounters:  01/21/22 276 lb 2 oz (125.2 kg)  12/04/21 283 lb (128.4 kg)  10/29/21 285 lb (129.3 kg)   Lab Results   Component Value Date   CREATININE 1.93 (H) 05/10/2021   CREATININE 2.06 (H) 04/13/2021   CREATININE 2.00 (H) 04/12/2021   Physical Exam Vitals and nursing note reviewed.  Constitutional:      General: He is not in acute distress.    Appearance: Normal appearance.  HENT:     Head: Normocephalic and atraumatic.  Cardiovascular:     Rate and Rhythm: Normal rate and regular rhythm.  Pulmonary:     Effort: Pulmonary effort is normal. No respiratory distress.     Breath sounds: No wheezing or rales.  Abdominal:     General: There is no distension.     Palpations: Abdomen is soft.     Tenderness: There is no abdominal tenderness.  Musculoskeletal:        General: No tenderness.     Cervical back: Normal range of motion and neck supple.     Right lower leg: No edema.     Left lower leg: No edema.  Skin:    General: Skin is warm and dry.  Neurological:     General: No focal deficit present.     Mental Status: He is alert and oriented to person, place, and time.     Motor: No weakness.     Gait: Gait normal.  Psychiatric:        Mood and Affect: Mood normal.        Behavior: Behavior normal.        Thought Content: Thought content normal.    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)- - NYHA class II - euvolemic today - not weighing daily but has working scales; encouraged to resume weighing daily so that he can call for an overnight weight gain of > 2 pounds or a weekly weight gain of > 5 pounds - weight down 7 pounds from last visit here 6 weeks ago - does not add salt to his food; reviewed the importance of reading food labels for sodium content - on GDMT of entresto; instructed to stop taking entresto 49/51mg  and continue the 97/103mg  dose - check BMP tomorrow as he has another appointment today that he doesn't want to be late for - consider adding SGLT2 - did not show for cardiology appt with Dr. Welton FlakesKhan on  02/04/21 - BNP 04/10/21 was 127.7 - PharmD reconciled medications with the patient  2: HTN with CKD- - BP elevated even after recheck (188/111) - given a BP cuff from TOC and he was instructed to check his BP daily and write the readings down - will resume isosorbide 30mg  daily - says that he completely misses taking his medications 1-2 times/ week because he gets busy and forgets - saw PCP Laural Benes([place]) 05/10/21; returns 01/31/22 - BMP 05/10/21 reviewed and showed  sodium 142, potassium 4.0, creatinine 1.93 and GFR 43  3: Tobacco use- - smoking 3-4 cigarettes daily - last smoked a few hours prior to appointment  4: Cocaine abuse- - tends to use on the weekends and says that he uses because of his back pain - says that "no one will treat the pain so I'm using the cocaine for relief" - explained that a provider may not want to prescribe narcotics if he's actively using cocaine  5: Back pain- - lower back, radiates down his left leg - pain is worse if he stands/walks too long - has finished PT on his foot - says that his back / foot pain started when at work grocery carts fell off a rack onto his back; working through the [employer] process   Medication bottles reviewed. Big X was put on the entresto 49/51mg  bottle so that he wouldn't take it  Return in 1 month, sooner if needed.

## 2022-01-21 ENCOUNTER — Other Ambulatory Visit: Payer: Self-pay

## 2022-01-21 ENCOUNTER — Ambulatory Visit: Payer: Self-pay | Attending: Family | Admitting: Family

## 2022-01-21 ENCOUNTER — Encounter: Payer: Self-pay | Admitting: Family

## 2022-01-21 ENCOUNTER — Other Ambulatory Visit: Payer: Self-pay | Admitting: Family

## 2022-01-21 VITALS — BP 188/111 | HR 87 | Resp 20 | Ht 66.0 in | Wt 276.1 lb

## 2022-01-21 DIAGNOSIS — Z720 Tobacco use: Secondary | ICD-10-CM

## 2022-01-21 DIAGNOSIS — I5032 Chronic diastolic (congestive) heart failure: Secondary | ICD-10-CM

## 2022-01-21 DIAGNOSIS — M79672 Pain in left foot: Secondary | ICD-10-CM | POA: Insufficient documentation

## 2022-01-21 DIAGNOSIS — M545 Low back pain, unspecified: Secondary | ICD-10-CM | POA: Insufficient documentation

## 2022-01-21 DIAGNOSIS — G8929 Other chronic pain: Secondary | ICD-10-CM

## 2022-01-21 DIAGNOSIS — Z79899 Other long term (current) drug therapy: Secondary | ICD-10-CM | POA: Insufficient documentation

## 2022-01-21 DIAGNOSIS — F17210 Nicotine dependence, cigarettes, uncomplicated: Secondary | ICD-10-CM | POA: Insufficient documentation

## 2022-01-21 DIAGNOSIS — N189 Chronic kidney disease, unspecified: Secondary | ICD-10-CM | POA: Insufficient documentation

## 2022-01-21 DIAGNOSIS — F1410 Cocaine abuse, uncomplicated: Secondary | ICD-10-CM | POA: Insufficient documentation

## 2022-01-21 DIAGNOSIS — M5442 Lumbago with sciatica, left side: Secondary | ICD-10-CM

## 2022-01-21 DIAGNOSIS — I10 Essential (primary) hypertension: Secondary | ICD-10-CM

## 2022-01-21 DIAGNOSIS — I130 Hypertensive heart and chronic kidney disease with heart failure and stage 1 through stage 4 chronic kidney disease, or unspecified chronic kidney disease: Secondary | ICD-10-CM | POA: Insufficient documentation

## 2022-01-21 MED ORDER — ISOSORBIDE MONONITRATE ER 30 MG PO TB24
30.0000 mg | ORAL_TABLET | Freq: Every day | ORAL | 3 refills | Status: DC
  Filled 2022-01-21: qty 90, 90d supply, fill #0
  Filled 2022-06-16: qty 90, 90d supply, fill #1
  Filled 2022-12-30: qty 90, 90d supply, fill #2

## 2022-01-21 NOTE — Progress Notes (Signed)
[facility] - PHARMACIST COUNSELING NOTE  Guideline-Directed Medical Therapy/Evidence Based Medicine  ACE/ARB/ARNI: Sacubitril-valsartan 97-103 mg twice daily Beta Blocker: Metoprolol succinate 25 mg daily Aldosterone Antagonist: none Diuretic: Furosemide 40 mg daily SGLT2i: none  Adherence Assessment  Do you ever forget to take your medication? [x] Yes [] No  Do you ever skip doses due to side effects? [] Yes [x] No  Do you have trouble affording your medicines? [] Yes [x] No  Are you ever unable to pick up your medication due to transportation difficulties? [] Yes [] No  Do you ever stop taking your medications because you don't believe they are helping? [] Yes [] No  Do you check your weight daily? [] Yes [x] No   Adherence strategy: keeps medications together in bag  Barriers to obtaining medications: none - Medication Management patient  Vital signs: HR 87, BP 188/111, weight (pounds) 276 lb ECHO: Date 01/13/2021, EF 60-65%, notes Grade I diastolic dysfunction     Latest Ref Rng & Units 05/10/2021   10:31 AM 04/13/2021    4:45 AM 04/12/2021    5:09 AM  BMP  Glucose 65 - 99 mg/dL 145   122   116    BUN 6 - 24 mg/dL 24   31   28     Creatinine 0.76 - 1.27 mg/dL 1.93   2.06   2.00    BUN/Creat Ratio 9 - 20 12      Sodium 134 - 144 mmol/L 142   138   136    Potassium 3.5 - 5.2 mmol/L 4.0   3.7   3.4    Chloride 96 - 106 mmol/L 105   99   100    CO2 20 - 29 mmol/L 23   27   26     Calcium 8.7 - 10.2 mg/dL 9.3   8.9   8.6      Past Medical History:  Diagnosis Date   Asthma    CHF (congestive heart failure) (HCC)    Diverticulitis    Hypertension     ASSESSMENT 46 year old male who presents to the HF clinic for follow up. PMH relevant to HTN, tobacco use, HFpEF. On Entresto, isosorbide mononitrate, and furosemide. Inquires about dosing/names of medications, and is unsure of names/doses of medications.  Recent ED Visit (past 6  months): Date - 10/07/2021, CC - ankle pain  PLAN Reached out to medication management to inquire about pill boxes. Likely needs assistance to place correct medications in pill box. Recommend keeping hydralazine nearby instead of at the house to assist in adherence of midday dose  Time spent: 20 minutes  Wynelle Cleveland, PharmD Pharmacy Resident  01/21/2022 5:06 PM   Current Outpatient Medications:    albuterol (VENTOLIN HFA) 108 (90 Base) MCG/ACT inhaler, Inhale 2 puffs into the lungs every 4 (four) hours as needed for wheezing or shortness of breath., Disp: 8 g, Rfl: 1   amLODipine (NORVASC) 10 MG tablet, Take 1 tablet (10 mg total) by mouth once daily., Disp: 90 tablet, Rfl: 3   aspirin EC 81 MG EC tablet, Take 1 tablet (81 mg total) by mouth daily. Swallow whole., Disp: 30 tablet, Rfl: 11   furosemide (LASIX) 40 MG tablet, Take 1 tablet (40 mg total) by mouth once daily., Disp: 90 tablet, Rfl: 3   hydrALAZINE (APRESOLINE) 100 MG tablet, Take 1 tablet (100 mg total) by mouth 3 (three) times daily., Disp: 270 tablet, Rfl: 3   isosorbide mononitrate (IMDUR) 30 MG 24 hr tablet,  Take 1 tablet (30 mg total) by mouth once daily., Disp: 90 tablet, Rfl: 3   metoprolol succinate (TOPROL-XL) 25 MG 24 hr tablet, Take 1 tablet (25 mg total) by mouth once daily. Take with or immediately following a meal. (Patient taking differently: Take 50 mg by mouth daily.), Disp: 90 tablet, Rfl: 3   rosuvastatin (CRESTOR) 20 MG tablet, Take 1 tablet (20 mg total) by mouth once daily. (Patient not taking: Reported on 06/10/2021), Disp: 90 tablet, Rfl: 3   sacubitril-valsartan (ENTRESTO) 97-103 MG, Take 1 tablet by mouth 2 (two) times daily., Disp: 180 tablet, Rfl: 3   COUNSELING POINTS/CLINICAL PEARLS   DRUGS TO CAUTION IN HEART FAILURE  Drug or Class Mechanism  Analgesics NSAIDs COX-2 inhibitors Glucocorticoids  Sodium and water retention, increased systemic vascular resistance, decreased response to  diuretics   Diabetes Medications Metformin Thiazolidinediones Rosiglitazone (Avandia) Pioglitazone (Actos) DPP4 Inhibitors Saxagliptin (Onglyza) Sitagliptin (Januvia)   Lactic acidosis Possible calcium channel blockade   Unknown  Antiarrhythmics Class I  Flecainide Disopyramide Class III Sotalol Other Dronedarone  Negative inotrope, proarrhythmic   Proarrhythmic, beta blockade  Negative inotrope  Antihypertensives Alpha Blockers Doxazosin Calcium Channel Blockers Diltiazem Verapamil Nifedipine Central Alpha Adrenergics Moxonidine Peripheral Vasodilators Minoxidil  Increases renin and aldosterone  Negative inotrope    Possible sympathetic withdrawal  Unknown  Anti-infective Itraconazole Amphotericin B  Negative inotrope Unknown  Hematologic Anagrelide Cilostazol   Possible inhibition of PD IV Inhibition of PD III causing arrhythmias  Neurologic/Psychiatric Stimulants Anti-Seizure Drugs Carbamazepine Pregabalin Antidepressants Tricyclics Citalopram Parkinsons Bromocriptine Pergolide Pramipexole Antipsychotics Clozapine Antimigraine Ergotamine Methysergide Appetite suppressants Bipolar Lithium  Peripheral alpha and beta agonist activity  Negative inotrope and chronotrope Calcium channel blockade  Negative inotrope, proarrhythmic Dose-dependent QT prolongation  Excessive serotonin activity/valvular damage Excessive serotonin activity/valvular damage Unknown  IgE mediated hypersensitivy, calcium channel blockade  Excessive serotonin activity/valvular damage Excessive serotonin activity/valvular damage Valvular damage  Direct myofibrillar degeneration, adrenergic stimulation  Antimalarials Chloroquine Hydroxychloroquine Intracellular inhibition of lysosomal enzymes  Urologic Agents Alpha Blockers Doxazosin Prazosin Tamsulosin Terazosin  Increased renin and aldosterone  Adapted from Page Carleene Overlie, et al. "Drugs That May  Cause or Exacerbate Heart Failure: A Scientific Statement from the American Heart  Association." Circulation 2016; 134:e32-e69. DOI: 10.1161/CIR.0000000000000426   MEDICATION ADHERENCES TIPS AND STRATEGIES Taking medication as prescribed improves patient outcomes in heart failure (reduces hospitalizations, improves symptoms, increases survival) Side effects of medications can be managed by decreasing doses, switching agents, stopping drugs, or adding additional therapy. Please let someone in the [facility] know if you have having bothersome side effects so we can modify your regimen. Do not alter your medication regimen without talking to [place].  Medication reminders can help patients remember to take drugs on time. If you are missing or forgetting doses you can try linking behaviors, using pill boxes, or an electronic reminder like an alarm on your phone or an app. Some people can also get automated phone calls as medication reminders.

## 2022-01-21 NOTE — Patient Instructions (Addendum)
Resume weighing daily and call for an overnight weight gain of 3 pounds or more or a weekly weight gain of more than 5 pounds.   If you have voicemail, please make sure your mailbox is cleaned out so that we may leave a message and please make sure to listen to any voicemails   Start taking isosorbide as 1 tablet every morning.

## 2022-01-22 ENCOUNTER — Inpatient Hospital Stay: Admission: RE | Admit: 2022-01-22 | Payer: Self-pay | Source: Ambulatory Visit

## 2022-01-23 ENCOUNTER — Other Ambulatory Visit
Admission: RE | Admit: 2022-01-23 | Discharge: 2022-01-23 | Disposition: A | Discharge status: Home / Self Care | Payer: Self-pay | Source: Ambulatory Visit | Attending: Family | Admitting: Family

## 2022-01-23 ENCOUNTER — Encounter
Admission: RE | Admit: 2022-01-23 | Discharge: 2022-01-23 | Disposition: A | Discharge status: Home / Self Care | Payer: Self-pay | Source: Ambulatory Visit | Attending: Family | Admitting: Family

## 2022-01-23 ENCOUNTER — Other Ambulatory Visit: Payer: Self-pay

## 2022-01-23 DIAGNOSIS — I5032 Chronic diastolic (congestive) heart failure: Secondary | ICD-10-CM | POA: Insufficient documentation

## 2022-01-23 LAB — BASIC METABOLIC PANEL WITH GFR
Anion gap: 10 (ref 5–15)
BUN: 34 mg/dL — ABNORMAL HIGH (ref 6–20)
CO2: 22 mmol/L (ref 22–32)
Calcium: 9.1 mg/dL (ref 8.9–10.3)
Chloride: 107 mmol/L (ref 98–111)
Creatinine, Ser: 1.92 mg/dL — ABNORMAL HIGH (ref 0.61–1.24)
GFR, Estimated: 43 mL/min — ABNORMAL LOW
Glucose, Bld: 141 mg/dL — ABNORMAL HIGH (ref 70–99)
Potassium: 4 mmol/L (ref 3.5–5.1)
Sodium: 139 mmol/L (ref 135–145)

## 2022-01-31 ENCOUNTER — Encounter: Payer: Self-pay | Admitting: Internal Medicine

## 2022-01-31 ENCOUNTER — Ambulatory Visit: Payer: Self-pay | Attending: Internal Medicine | Admitting: Internal Medicine

## 2022-01-31 VITALS — BP 143/92 | HR 76 | Wt 269.6 lb

## 2022-01-31 DIAGNOSIS — I5032 Chronic diastolic (congestive) heart failure: Secondary | ICD-10-CM

## 2022-01-31 DIAGNOSIS — E1169 Type 2 diabetes mellitus with other specified complication: Secondary | ICD-10-CM

## 2022-01-31 DIAGNOSIS — M5416 Radiculopathy, lumbar region: Secondary | ICD-10-CM

## 2022-01-31 DIAGNOSIS — I10 Essential (primary) hypertension: Secondary | ICD-10-CM

## 2022-01-31 DIAGNOSIS — N1832 Chronic kidney disease, stage 3b: Secondary | ICD-10-CM

## 2022-01-31 DIAGNOSIS — G8929 Other chronic pain: Secondary | ICD-10-CM

## 2022-01-31 LAB — GLUCOSE, POCT (MANUAL RESULT ENTRY): POC Glucose: 128 mg/dl — AB (ref 70–99)

## 2022-01-31 LAB — POCT GLYCOSYLATED HEMOGLOBIN (HGB A1C): HbA1c, POC (controlled diabetic range): 6.3 % (ref 0.0–7.0)

## 2022-01-31 NOTE — Patient Instructions (Addendum)
Your blood pressure is not at goal.  You are missing one of your blood pressure medication called isosorbide 30 mg.  Please pick up refills from the pharmacy. Check your blood pressure at least twice a week with goal being 130/80 or lower.  Please let me know if you are running higher than that.  You are also missing a cholesterol medication called rosuvastatin.  Please pick up a refill from the pharmacy.  Try to get an eye exam at least once a year given that you have diabetes.  Please get the forms for the orange card/cone discount card when you go through checkout today.  Take them home and apply for the cone discount.  Once approved, please let me know so that we can order the sleep study.  Please get a printed report of your MRI results from [facility] where you had the MRI done.  Diabetes Mellitus and Nutrition, Adult When you have diabetes, or diabetes mellitus, it is very important to have healthy eating habits because your blood sugar (glucose) levels are greatly affected by what you eat and drink. Eating healthy foods in the right amounts, at about the same times every day, can help you: Manage your blood glucose. Lower your risk of heart disease. Improve your blood pressure. Reach or maintain a healthy weight. What can affect my meal plan? Every person with diabetes is different, and each person has different needs for a meal plan. Your health care provider may recommend that you work with a dietitian to make a meal plan that is best for you. Your meal plan may vary depending on factors such as: The calories you need. The medicines you take. Your weight. Your blood glucose, blood pressure, and cholesterol levels. Your activity level. Other health conditions you have, such as heart or kidney disease. How do carbohydrates affect me? Carbohydrates, also called carbs, affect your blood glucose level more than any other type of food. Eating carbs raises the amount of glucose in your  blood. It is important to know how many carbs you can safely have in each meal. This is different for every person. Your dietitian can help you calculate how many carbs you should have at each meal and for each snack. How does alcohol affect me? Alcohol can cause a decrease in blood glucose (hypoglycemia), especially if you use insulin or take certain diabetes medicines by mouth. Hypoglycemia can be a life-threatening condition. Symptoms of hypoglycemia, such as sleepiness, dizziness, and confusion, are similar to symptoms of having too much alcohol. Do not drink alcohol if: Your health care provider tells you not to drink. You are pregnant, may be pregnant, or are planning to become pregnant. If you drink alcohol: Limit how much you have to: 0-1 drink a day for women. 0-2 drinks a day for men. Know how much alcohol is in your drink. In the [place] one drink equals one 12 oz bottle of beer (355 mL), one 5 oz glass of wine (148 mL), or one 1 oz glass of hard liquor (44 mL). Keep yourself hydrated with water, diet soda, or unsweetened iced tea. Keep in mind that regular soda, juice, and other mixers may contain a lot of sugar and must be counted as carbs. What are tips for following this plan?  Reading food labels Start by checking the serving size on the Nutrition Facts label of packaged foods and drinks. The number of calories and the amount of carbs, fats, and other nutrients listed on the label are  based on one serving of the item. Many items contain more than one serving per package. Check the total grams (g) of carbs in one serving. Check the number of grams of saturated fats and trans fats in one serving. Choose foods that have a low amount or none of these fats. Check the number of milligrams (mg) of salt (sodium) in one serving. Most people should limit total sodium intake to less than 2,300 mg per day. Always check the nutrition information of foods labeled as "low-fat" or "nonfat." These  foods may be higher in added sugar or refined carbs and should be avoided. Talk to your dietitian to identify your daily goals for nutrients listed on the label. Shopping Avoid buying canned, pre-made, or processed foods. These foods tend to be high in fat, sodium, and added sugar. Shop around the outside edge of the grocery store. This is where you will most often find fresh fruits and vegetables, bulk grains, fresh meats, and fresh dairy products. Cooking Use low-heat cooking methods, such as baking, instead of high-heat cooking methods, such as deep frying. Cook using healthy oils, such as olive, canola, or sunflower oil. Avoid cooking with butter, cream, or high-fat meats. Meal planning Eat meals and snacks regularly, preferably at the same times every day. Avoid going long periods of time without eating. Eat foods that are high in fiber, such as fresh fruits, vegetables, beans, and whole grains. Eat 4-6 oz (112-168 g) of lean protein each day, such as lean meat, chicken, fish, eggs, or tofu. One ounce (oz) (28 g) of lean protein is equal to: 1 oz (28 g) of meat, chicken, or fish. 1 egg.  cup (62 g) of tofu. Eat some foods each day that contain healthy fats, such as avocado, nuts, seeds, and fish. What foods should I eat? Fruits Berries. Apples. Oranges. Peaches. Apricots. Plums. Grapes. Mangoes. Papayas. Pomegranates. Kiwi. Cherries. Vegetables Leafy greens, including lettuce, spinach, kale, chard, collard greens, mustard greens, and cabbage. Beets. Cauliflower. Broccoli. Carrots. Green beans. Tomatoes. Peppers. Onions. Cucumbers. Brussels sprouts. Grains Whole grains, such as whole-wheat or whole-grain bread, crackers, tortillas, cereal, and pasta. Unsweetened oatmeal. Quinoa. Brown or wild rice. Meats and other proteins Seafood. Poultry without skin. Lean cuts of poultry and beef. Tofu. Nuts. Seeds. Dairy Low-fat or fat-free dairy products such as milk, yogurt, and cheese. The  items listed above may not be a complete list of foods and beverages you can eat and drink. Contact a dietitian for more information. What foods should I avoid? Fruits Fruits canned with syrup. Vegetables Canned vegetables. Frozen vegetables with butter or cream sauce. Grains Refined white flour and flour products such as bread, pasta, snack foods, and cereals. Avoid all processed foods. Meats and other proteins Fatty cuts of meat. Poultry with skin. Breaded or fried meats. Processed meat. Avoid saturated fats. Dairy Full-fat yogurt, cheese, or milk. Beverages Sweetened drinks, such as soda or iced tea. The items listed above may not be a complete list of foods and beverages you should avoid. Contact a dietitian for more information. Questions to ask a health care provider Do I need to meet with a certified diabetes care and education specialist? Do I need to meet with a dietitian? What number can I call if I have questions? When are the best times to check my blood glucose? Where to find more information: American Diabetes Association: diabetes.org Academy of Nutrition and Dietetics: eatright.[employer] of Diabetes and Digestive and Kidney Diseases: [email or web address].si Association of Diabetes Care &  Education Specialists: diabeteseducator.org Summary It is important to have healthy eating habits because your blood sugar (glucose) levels are greatly affected by what you eat and drink. It is important to use alcohol carefully. A healthy meal plan will help you manage your blood glucose and lower your risk of heart disease. Your health care provider may recommend that you work with a dietitian to make a meal plan that is best for you. This information is not intended to replace advice given to you by your health care provider. Make sure you discuss any questions you have with your health care provider. Document Revised: 03/14/2020 Document Reviewed: 03/14/2020 Elsevier Patient  Education  2023 [employer]

## 2022-01-31 NOTE — Progress Notes (Signed)
Patient ID: Brad [name]., male    DOB: Dec 31, 1975  MRN: [id number]  CC: chronic ds management  Subjective: Brad [name] is a 46 y.o. male who presents for chronic ds management.  I last saw him 04/2021 His concerns today include:  Patient with history of DM, HTN, CKD 3, HL, diastolic CHF (EF 60 to 123456 12/2020), tob dep, polysubst use (cocaine,marijuana), diverticulosis  Patient brings this with results of MRI of the left ankle and lumbar spine that was done at [facility] in April of this year for my review.   He reports being injured at work in February of this year.  He reports that about 6 shopping carts that were being carried by a forklift fell over on his back causing him to fall forward.  He injured his left ankle and was having back pain since.  He was denied Workmen's Comp.  He is now working with a Chief Executive Officer to try to get Workmen's Comp. to cover his injury.   Reports being diagnosed with a sprain to the left ankle.  He was having to wear a boot to the left ankle for 1 to 2 months.  He did physical therapy which ended last month.  Complains of pain across the lower back that radiates into the buttock on both sides.  No numbness or tingling.  No saddle anesthesia.  No incontinence of bowel or bladder. Pain is constant. Pain is worse with standing or walking for more than 5 to 10 minutes. Chiropractic treatments have helped somewhat.  Seeing a chiropractor 3 times a week.  He has a few sessions left.  CHF/HTN/CKD3:  Has meds with him.  He is supposed to be on amlodipine 10 mg daily, furosemide 40 mg daily, hydralazine 100 mg 3 times a day, metoprolol 25 mg daily, Entresto 97/103 mg daily and isosorbide 30 mg daily.  He does not have isosorbide.  He is also supposed to be on Crestor 20 mg daily for cholesterol but he does not have that either.  He is followed by heart failure clinic in [place].   Took meds already today Checks BP QOD - getting 170s/90 No CP/SOB/LE edema Not on  NSAIDS Creatinine has ranged 1.79-2.  Most recent reading was 1.93.  GFR has been in the 40s with most recent 143  Tob: down to 2-3 cig/day.  Trying to quit.  DM: Lab Results  Component Value Date   HGBA1C 6.3 01/31/2022  Not on any medications for this.  Weight is down 26 pounds from when I last saw him in September of last year.  He reports he has not been eating as much.  No insurance at this time to get eye exam done.  HM: He declines colon cancer screening at this time even with fit test.  He thinks he has had a Tdap vaccine within the past 10 years.  Patient Active Problem List   Diagnosis Date Noted   Chronic pain of right ankle 05/10/2021   Obesity, Class III, BMI 40-49.9 (morbid obesity) ([place]) 04/10/2021   Hypertensive emergency 04/10/2021   Acute on chronic diastolic CHF (congestive heart failure) ([place]) 02/15/2021   Hypertensive kidney disease with stage 3a chronic kidney disease ([place]) 02/15/2021   Type 2 diabetes mellitus with morbid obesity ([place]) 02/15/2021   Tobacco dependence 02/15/2021   Cocaine abuse in remission ([place]) 02/15/2021   Marijuana user 02/15/2021   Acute pulmonary edema ([place])    Acute CHF ([place]) 01/12/2021   Asthma  Hypertension    Cocaine abuse ([place]) 04/17/2020   Pneumonia due to COVID-19 virus 04/16/2020   Acute hypoxemic respiratory failure due to COVID-19 [facility]) 04/16/2020   Benign essential HTN 04/16/2020   CKD (chronic kidney disease), stage IIIa 04/16/2020   Diverticulitis 10/06/2018   Hypertensive urgency 04/18/2016   Intractable nausea and vomiting 05/31/2015     Current Outpatient Medications on File Prior to Visit  Medication Sig Dispense Refill   albuterol (VENTOLIN HFA) 108 (90 Base) MCG/ACT inhaler Inhale 2 puffs into the lungs every 4 (four) hours as needed for wheezing or shortness of breath. 8 g 1   amLODipine (NORVASC) 10 MG tablet Take 1 tablet (10 mg total) by mouth once daily. 90 tablet 3   aspirin EC 81 MG EC tablet Take 1  tablet (81 mg total) by mouth daily. Swallow whole. 30 tablet 11   furosemide (LASIX) 40 MG tablet Take 1 tablet (40 mg total) by mouth once daily. 90 tablet 3   hydrALAZINE (APRESOLINE) 100 MG tablet Take 1 tablet (100 mg total) by mouth 3 (three) times daily. 270 tablet 3   isosorbide mononitrate (IMDUR) 30 MG 24 hr tablet Take 1 tablet (30 mg total) by mouth once daily. 90 tablet 3   metoprolol succinate (TOPROL-XL) 25 MG 24 hr tablet Take 1 tablet (25 mg total) by mouth once daily. Take with or immediately following a meal. (Patient taking differently: Take 50 mg by mouth daily.) 90 tablet 3   rosuvastatin (CRESTOR) 20 MG tablet Take 1 tablet (20 mg total) by mouth once daily. 90 tablet 3   sacubitril-valsartan (ENTRESTO) 97-103 MG Take 1 tablet by mouth 2 (two) times daily. 180 tablet 3   No current facility-administered medications on file prior to visit.    No Known Allergies  Social History   Socioeconomic History   Marital status: Single    Spouse name: Not on file   Number of children: Not on file   Years of education: Not on file   Highest education level: Not on file  Occupational History   Not on file  Tobacco Use   Smoking status: Every Day    Packs/day: 0.50    Types: Cigarettes   Smokeless tobacco: Never  Substance and Sexual Activity   Alcohol use: Yes    Comment: occas   Drug use: Yes    Types: Marijuana, Cocaine    Comment: daily, cocaine not as often   Sexual activity: Not on file  Other Topics Concern   Not on file  Social History Narrative   Not on file   Social Determinants of Health   Financial Resource Strain: Not on file  Food Insecurity: Not on file  Transportation Needs: Not on file  Physical Activity: Not on file  Stress: Not on file  Social Connections: Not on file  Intimate Partner Violence: Not on file    Family History  Problem Relation Age of Onset   Hypertension Mother     Past Surgical History:  Procedure Laterality Date    ANKLE SURGERY      ROS: Review of Systems Negative except as stated above  PHYSICAL EXAM: BP (!) 143/92   Pulse 76   Wt 269 lb 9.6 oz (122.3 kg)   SpO2 96%   BMI 43.51 kg/m   Wt Readings from Last 3 Encounters:  01/31/22 269 lb 9.6 oz (122.3 kg)  01/21/22 276 lb 2 oz (125.2 kg)  12/04/21 283 lb (128.4 kg)  Physical Exam General appearance - alert, well appearing, and in no distress Mental status - normal mood, behavior, speech, dress, motor activity, and thought processes Neck - supple, no significant adenopathy Chest - clear to auscultation, no wheezes, rales or rhonchi, symmetric air entry Heart - normal rate, regular rhythm, normal S1, S2, no murmurs, rubs, clicks or gallops Extremities - peripheral pulses normal, no pedal edema, no clubbing or cyanosis MSK: Gait is normal. No tenderness on palpation of the lumbar spine. He is able to elevate both legs beyond 60 degrees without problems on straight leg raise. Power in both lower extremities 5/5 proximally and distally.  Gross sensation intact. Diabetic Foot Exam - Simple   Simple Foot Form Diabetic Foot exam was performed with the following findings: Yes 01/31/2022  2:57 PM  Visual Inspection See comments: Yes Sensation Testing Intact to touch and monofilament testing bilaterally: Yes Pulse Check Posterior Tibialis and Dorsalis pulse intact bilaterally: Yes Comments Toenails are discolored and overgrown.  Thickened skin on the soles of both feet.        Latest Ref Rng & Units 01/23/2022    2:08 PM 05/10/2021   10:31 AM 04/13/2021    4:45 AM  CMP  Glucose 70 - 99 mg/dL 141  145  122   BUN 6 - 20 mg/dL 34  24  31   Creatinine 0.61 - 1.24 mg/dL 1.92  1.93  2.06   Sodium 135 - 145 mmol/L 139  142  138   Potassium 3.5 - 5.1 mmol/L 4.0  4.0  3.7   Chloride 98 - 111 mmol/L 107  105  99   CO2 22 - 32 mmol/L 22  23  27    Calcium 8.9 - 10.3 mg/dL 9.1  9.3  8.9    Lipid Panel     Component Value Date/Time   CHOL 253  (H) 01/13/2021 0614   TRIG 164 (H) 01/13/2021 0614   HDL 51 01/13/2021 0614   CHOLHDL 5.0 01/13/2021 0614   VLDL 33 01/13/2021 0614   LDLCALC 169 (H) 01/13/2021 0614    CBC    Component Value Date/Time   WBC 8.0 04/13/2021 0445   RBC 5.75 04/13/2021 0445   HGB 15.4 04/13/2021 0445   HCT 46.3 04/13/2021 0445   PLT 321 04/13/2021 0445   MCV 80.5 04/13/2021 0445   MCH 26.8 04/13/2021 0445   MCHC 33.3 04/13/2021 0445   RDW 15.8 (H) 04/13/2021 0445   LYMPHSABS 1.2 04/19/2020 0528   MONOABS 0.6 04/19/2020 0528   EOSABS 0.0 04/19/2020 0528   BASOSABS 0.0 04/19/2020 0528    ASSESSMENT AND PLAN: 1. Essential hypertension Not at goal.  He is missing Imdur.  He still has refills on the prescription.  Advised to pick that up today from his pharmacy.  Otherwise continue amlodipine, metoprolol, hydralazine, furosemide and Entresto.  2. Chronic diastolic congestive heart failure ([place]) Compensated.  Continue Entresto, isosorbide, metoprolol, furosemide  3. Type 2 diabetes mellitus with morbid obesity ([place]) Commended him on weight loss.  We do not need to put him on medication at this time. Patient advised to eliminate sugary drinks from the diet, cut back on portion sizes especially of white carbohydrates, eat more white lean meat like chicken [place] and seafood instead of beef or pork and incorporate fresh fruits and vegetables into the diet daily.  - POCT glucose (manual entry) - POCT glycosylated hemoglobin (Hb A1C) - CBC - Lipid panel - Hepatic function panel  4. Stage 3b chronic  kidney disease ([place]) Stable.  Continue to monitor  5. Chronic radicular pain of lower back Advised patient that I do not read MRI images.  It would be better for him to get the written report from the radiology department where he had the MRIs done.  Once I see the report of the MRI of the lumbar spine, we can decide whether he needs to see a spine specialist.    Patient was given the opportunity to  ask questions.  Patient verbalized understanding of the plan and was able to repeat key elements of the plan.   This documentation was completed using Radio producer.  Any transcriptional errors are unintentional.  Orders Placed This Encounter  Procedures   CBC   Lipid panel   Hepatic function panel   POCT glucose (manual entry)   POCT glycosylated hemoglobin (Hb A1C)     Requested Prescriptions    No prescriptions requested or ordered in this encounter    Return in about 3 months (around 05/03/2022).  Karle Plumber, MD, FACP

## 2022-02-01 ENCOUNTER — Other Ambulatory Visit: Payer: Self-pay | Admitting: Internal Medicine

## 2022-02-01 ENCOUNTER — Other Ambulatory Visit: Payer: Self-pay

## 2022-02-01 LAB — HEPATIC FUNCTION PANEL
ALT: 14 IU/L (ref 0–44)
AST: 13 IU/L (ref 0–40)
Albumin: 4.6 g/dL (ref 4.0–5.0)
Alkaline Phosphatase: 75 IU/L (ref 44–121)
Bilirubin Total: 0.3 mg/dL (ref 0.0–1.2)
Bilirubin, Direct: 0.11 mg/dL (ref 0.00–0.40)
Total Protein: 7.7 g/dL (ref 6.0–8.5)

## 2022-02-01 LAB — CBC
Hematocrit: 47.1 % (ref 37.5–51.0)
Hemoglobin: 16 g/dL (ref 13.0–17.7)
MCH: 26.3 pg — ABNORMAL LOW (ref 26.6–33.0)
MCHC: 34 g/dL (ref 31.5–35.7)
MCV: 78 fL — ABNORMAL LOW (ref 79–97)
Platelets: 317 10*3/uL (ref 150–450)
RBC: 6.08 x10E6/uL — ABNORMAL HIGH (ref 4.14–5.80)
RDW: 15.6 % — ABNORMAL HIGH (ref 11.6–15.4)
WBC: 6.9 10*3/uL (ref 3.4–10.8)

## 2022-02-01 LAB — LIPID PANEL
Chol/HDL Ratio: 4.6 ratio (ref 0.0–5.0)
Cholesterol, Total: 239 mg/dL — ABNORMAL HIGH (ref 100–199)
HDL: 52 mg/dL (ref >39)
LDL Chol Calc (NIH): 169 mg/dL — ABNORMAL HIGH (ref 0–99)
Triglycerides: 103 mg/dL (ref 0–149)
VLDL Cholesterol Cal: 18 mg/dL (ref 5–40)

## 2022-02-01 MED ORDER — ROSUVASTATIN CALCIUM 20 MG PO TABS
20.0000 mg | ORAL_TABLET | Freq: Every day | ORAL | 3 refills | Status: DC
  Filled 2022-02-01: qty 30, 30d supply, fill #0

## 2022-02-01 NOTE — Progress Notes (Signed)
Let pt know his blood cell counts are normal. Cholesterol level is elevated.  Please take the Rosuvastatin as prescribed.  I have sent an updated prescription to his pharmacy.  Liver function test is normal

## 2022-02-02 ENCOUNTER — Other Ambulatory Visit: Payer: Self-pay

## 2022-02-03 ENCOUNTER — Other Ambulatory Visit: Payer: Self-pay

## 2022-02-18 ENCOUNTER — Other Ambulatory Visit: Payer: Self-pay

## 2022-02-18 MED ORDER — SACUBITRIL-VALSARTAN 49-51 MG PO TABS: ORAL_TABLET | ORAL | 3 refills | Status: DC

## 2022-02-21 ENCOUNTER — Ambulatory Visit: Payer: Self-pay | Admitting: Family

## 2022-02-27 ENCOUNTER — Other Ambulatory Visit: Payer: Self-pay

## 2022-03-04 ENCOUNTER — Emergency Department: Payer: Self-pay

## 2022-03-04 ENCOUNTER — Inpatient Hospital Stay (HOSPITAL_COMMUNITY)
Admit: 2022-03-04 | Discharge: 2022-03-04 | Disposition: A | Discharge status: Home / Self Care | Payer: Self-pay | Attending: Adult Health | Admitting: Adult Health

## 2022-03-04 ENCOUNTER — Inpatient Hospital Stay
Admission: EM | Admit: 2022-03-04 | Discharge: 2022-03-06 | DRG: 280 | Disposition: A | Discharge status: Home / Self Care | Payer: Self-pay | Attending: Internal Medicine | Admitting: Internal Medicine

## 2022-03-04 ENCOUNTER — Other Ambulatory Visit: Payer: Self-pay

## 2022-03-04 DIAGNOSIS — Z720 Tobacco use: Secondary | ICD-10-CM

## 2022-03-04 DIAGNOSIS — J439 Emphysema, unspecified: Secondary | ICD-10-CM | POA: Diagnosis present

## 2022-03-04 DIAGNOSIS — F1410 Cocaine abuse, uncomplicated: Secondary | ICD-10-CM

## 2022-03-04 DIAGNOSIS — J45909 Unspecified asthma, uncomplicated: Secondary | ICD-10-CM | POA: Diagnosis present

## 2022-03-04 DIAGNOSIS — N183 Chronic kidney disease, stage 3 unspecified: Secondary | ICD-10-CM

## 2022-03-04 DIAGNOSIS — R778 Other specified abnormalities of plasma proteins: Secondary | ICD-10-CM

## 2022-03-04 DIAGNOSIS — F1490 Cocaine use, unspecified, uncomplicated: Secondary | ICD-10-CM

## 2022-03-04 DIAGNOSIS — I4891 Unspecified atrial fibrillation: Principal | ICD-10-CM | POA: Diagnosis present

## 2022-03-04 DIAGNOSIS — I130 Hypertensive heart and chronic kidney disease with heart failure and stage 1 through stage 4 chronic kidney disease, or unspecified chronic kidney disease: Secondary | ICD-10-CM | POA: Diagnosis present

## 2022-03-04 DIAGNOSIS — I509 Heart failure, unspecified: Secondary | ICD-10-CM

## 2022-03-04 DIAGNOSIS — R0989 Other specified symptoms and signs involving the circulatory and respiratory systems: Secondary | ICD-10-CM

## 2022-03-04 DIAGNOSIS — E1122 Type 2 diabetes mellitus with diabetic chronic kidney disease: Secondary | ICD-10-CM | POA: Diagnosis present

## 2022-03-04 DIAGNOSIS — Z6841 Body Mass Index (BMI) 40.0 and over, adult: Secondary | ICD-10-CM

## 2022-03-04 DIAGNOSIS — N1831 Chronic kidney disease, stage 3a: Secondary | ICD-10-CM | POA: Diagnosis present

## 2022-03-04 DIAGNOSIS — F17210 Nicotine dependence, cigarettes, uncomplicated: Secondary | ICD-10-CM | POA: Diagnosis present

## 2022-03-04 DIAGNOSIS — R570 Cardiogenic shock: Secondary | ICD-10-CM | POA: Diagnosis present

## 2022-03-04 DIAGNOSIS — I214 Non-ST elevation (NSTEMI) myocardial infarction: Secondary | ICD-10-CM | POA: Diagnosis present

## 2022-03-04 DIAGNOSIS — I5033 Acute on chronic diastolic (congestive) heart failure: Secondary | ICD-10-CM | POA: Diagnosis present

## 2022-03-04 DIAGNOSIS — Z79899 Other long term (current) drug therapy: Secondary | ICD-10-CM

## 2022-03-04 DIAGNOSIS — N179 Acute kidney failure, unspecified: Secondary | ICD-10-CM | POA: Diagnosis present

## 2022-03-04 DIAGNOSIS — Z8249 Family history of ischemic heart disease and other diseases of the circulatory system: Secondary | ICD-10-CM

## 2022-03-04 DIAGNOSIS — E1169 Type 2 diabetes mellitus with other specified complication: Secondary | ICD-10-CM | POA: Diagnosis present

## 2022-03-04 DIAGNOSIS — Z7982 Long term (current) use of aspirin: Secondary | ICD-10-CM

## 2022-03-04 DIAGNOSIS — T405X1A Poisoning by cocaine, accidental (unintentional), initial encounter: Secondary | ICD-10-CM | POA: Diagnosis present

## 2022-03-04 DIAGNOSIS — R079 Chest pain, unspecified: Secondary | ICD-10-CM

## 2022-03-04 DIAGNOSIS — J9600 Acute respiratory failure, unspecified whether with hypoxia or hypercapnia: Secondary | ICD-10-CM | POA: Diagnosis present

## 2022-03-04 DIAGNOSIS — G4730 Sleep apnea, unspecified: Secondary | ICD-10-CM | POA: Diagnosis present

## 2022-03-04 DIAGNOSIS — I10 Essential (primary) hypertension: Secondary | ICD-10-CM

## 2022-03-04 DIAGNOSIS — I5032 Chronic diastolic (congestive) heart failure: Secondary | ICD-10-CM

## 2022-03-04 DIAGNOSIS — I248 Other forms of acute ischemic heart disease: Secondary | ICD-10-CM

## 2022-03-04 DIAGNOSIS — F1290 Cannabis use, unspecified, uncomplicated: Secondary | ICD-10-CM | POA: Diagnosis present

## 2022-03-04 DIAGNOSIS — I959 Hypotension, unspecified: Secondary | ICD-10-CM

## 2022-03-04 HISTORY — DX: Cocaine abuse, uncomplicated: F14.10

## 2022-03-04 HISTORY — DX: Nicotine dependence, unspecified, uncomplicated: F17.200

## 2022-03-04 HISTORY — DX: Chronic kidney disease, stage 3 unspecified: N18.30

## 2022-03-04 LAB — ECHOCARDIOGRAM COMPLETE
AR max vel: 2.88 cm2
AV Area VTI: 3.01 cm2
AV Area mean vel: 2.86 cm2
AV Mean grad: 3 mmHg
AV Peak grad: 4.9 mmHg
Ao pk vel: 1.11 m/s
Area-P 1/2: 4.19 cm2
Height: 66 in
S' Lateral: 3 cm
Weight: 4398.62 oz

## 2022-03-04 LAB — PROTIME-INR
INR: 1 (ref 0.8–1.2)
Prothrombin Time: 12.8 seconds (ref 11.4–15.2)

## 2022-03-04 LAB — TROPONIN I (HIGH SENSITIVITY)
Troponin I (High Sensitivity): 41 ng/L — ABNORMAL HIGH (ref <18)
Troponin I (High Sensitivity): 475 ng/L (ref <18)
Troponin I (High Sensitivity): 1192 ng/L (ref <18)
Troponin I (High Sensitivity): 2103 ng/L (ref <18)

## 2022-03-04 LAB — CBC
HCT: 42.9 % (ref 39.0–52.0)
Hemoglobin: 13.7 g/dL (ref 13.0–17.0)
MCH: 25.9 pg — ABNORMAL LOW (ref 26.0–34.0)
MCHC: 31.9 g/dL (ref 30.0–36.0)
MCV: 81.1 fL (ref 80.0–100.0)
Platelets: 242 10*3/uL (ref 150–400)
RBC: 5.29 MIL/uL (ref 4.22–5.81)
RDW: 15.7 % — ABNORMAL HIGH (ref 11.5–15.5)
WBC: 9.5 10*3/uL (ref 4.0–10.5)
nRBC: 0 % (ref 0.0–0.2)

## 2022-03-04 LAB — LACTIC ACID, PLASMA: Lactic Acid, Venous: 1.7 mmol/L (ref 0.5–1.9)

## 2022-03-04 LAB — BASIC METABOLIC PANEL
Anion gap: 9 (ref 5–15)
BUN: 25 mg/dL — ABNORMAL HIGH (ref 6–20)
CO2: 23 mmol/L (ref 22–32)
Calcium: 9.3 mg/dL (ref 8.9–10.3)
Chloride: 109 mmol/L (ref 98–111)
Creatinine, Ser: 2.42 mg/dL — ABNORMAL HIGH (ref 0.61–1.24)
GFR, Estimated: 33 mL/min — ABNORMAL LOW (ref >60)
Glucose, Bld: 146 mg/dL — ABNORMAL HIGH (ref 70–99)
Potassium: 3.4 mmol/L — ABNORMAL LOW (ref 3.5–5.1)
Sodium: 141 mmol/L (ref 135–145)

## 2022-03-04 LAB — COMPREHENSIVE METABOLIC PANEL
ALT: 12 U/L (ref 0–44)
AST: 24 U/L (ref 15–41)
Albumin: 4.1 g/dL (ref 3.5–5.0)
Alkaline Phosphatase: 56 U/L (ref 38–126)
Anion gap: 15 (ref 5–15)
BUN: 23 mg/dL — ABNORMAL HIGH (ref 6–20)
CO2: 19 mmol/L — ABNORMAL LOW (ref 22–32)
Calcium: 9.7 mg/dL (ref 8.9–10.3)
Chloride: 107 mmol/L (ref 98–111)
Creatinine, Ser: 2.48 mg/dL — ABNORMAL HIGH (ref 0.61–1.24)
GFR, Estimated: 32 mL/min — ABNORMAL LOW (ref >60)
Glucose, Bld: 156 mg/dL — ABNORMAL HIGH (ref 70–99)
Potassium: 4.3 mmol/L (ref 3.5–5.1)
Sodium: 141 mmol/L (ref 135–145)
Total Bilirubin: 1.4 mg/dL — ABNORMAL HIGH (ref 0.3–1.2)
Total Protein: 8.1 g/dL (ref 6.5–8.1)

## 2022-03-04 LAB — HEPARIN LEVEL (UNFRACTIONATED)
Heparin Unfractionated: 0.25 IU/mL — ABNORMAL LOW (ref 0.30–0.70)
Heparin Unfractionated: 0.22 IU/mL — ABNORMAL LOW (ref 0.30–0.70)

## 2022-03-04 LAB — CBC WITH DIFFERENTIAL/PLATELET
Abs Immature Granulocytes: 0.03 10*3/uL (ref 0.00–0.07)
Basophils Absolute: 0.1 10*3/uL (ref 0.0–0.1)
Basophils Relative: 1 %
Eosinophils Absolute: 0 10*3/uL (ref 0.0–0.5)
Eosinophils Relative: 0 %
HCT: 47.8 % (ref 39.0–52.0)
Hemoglobin: 15.1 g/dL (ref 13.0–17.0)
Immature Granulocytes: 0 %
Lymphocytes Relative: 44 %
Lymphs Abs: 4.5 10*3/uL — ABNORMAL HIGH (ref 0.7–4.0)
MCH: 25.5 pg — ABNORMAL LOW (ref 26.0–34.0)
MCHC: 31.6 g/dL (ref 30.0–36.0)
MCV: 80.9 fL (ref 80.0–100.0)
Monocytes Absolute: 0.9 10*3/uL (ref 0.1–1.0)
Monocytes Relative: 9 %
Neutro Abs: 4.7 10*3/uL (ref 1.7–7.7)
Neutrophils Relative %: 46 %
Platelets: 327 10*3/uL (ref 150–400)
RBC: 5.91 MIL/uL — ABNORMAL HIGH (ref 4.22–5.81)
RDW: 16 % — ABNORMAL HIGH (ref 11.5–15.5)
WBC: 10.2 10*3/uL (ref 4.0–10.5)
nRBC: 0 % (ref 0.0–0.2)

## 2022-03-04 LAB — HEMOGLOBIN A1C
Hgb A1c MFr Bld: 6.2 % — ABNORMAL HIGH (ref 4.8–5.6)
Mean Plasma Glucose: 131.24 mg/dL

## 2022-03-04 LAB — MAGNESIUM
Magnesium: 2.1 mg/dL (ref 1.7–2.4)
Magnesium: 2.1 mg/dL (ref 1.7–2.4)

## 2022-03-04 LAB — ETHANOL: Alcohol, Ethyl (B): <10 mg/dL (ref <10)

## 2022-03-04 LAB — HIV ANTIBODY (ROUTINE TESTING W REFLEX): HIV Screen 4th Generation wRfx: NONREACTIVE

## 2022-03-04 LAB — TSH: TSH: 2.71 u[IU]/mL (ref 0.350–4.500)

## 2022-03-04 LAB — GLUCOSE, CAPILLARY: Glucose-Capillary: 96 mg/dL (ref 70–99)

## 2022-03-04 LAB — BRAIN NATRIURETIC PEPTIDE: B Natriuretic Peptide: 713 pg/mL — ABNORMAL HIGH (ref 0.0–100.0)

## 2022-03-04 LAB — PHOSPHORUS: Phosphorus: 3.6 mg/dL (ref 2.5–4.6)

## 2022-03-04 LAB — APTT: aPTT: 31 seconds (ref 24–36)

## 2022-03-04 MED ORDER — HEPARIN BOLUS VIA INFUSION
5000.0000 [IU] | Freq: Once | INTRAVENOUS | Status: AC
  Administered 2022-03-04: 5000 [IU] via INTRAVENOUS
  Filled 2022-03-04: qty 5000

## 2022-03-04 MED ORDER — ONDANSETRON HCL 4 MG/2ML IJ SOLN: 4.0000 mg | Freq: Four times a day (QID) | INTRAMUSCULAR | Status: DC | PRN

## 2022-03-04 MED ORDER — NICOTINE 21 MG/24HR TD PT24
21.0000 mg | MEDICATED_PATCH | Freq: Every day | TRANSDERMAL | Status: DC
  Administered 2022-03-04 – 2022-03-06 (×3): 21 mg via TRANSDERMAL
  Filled 2022-03-04 (×3): qty 1

## 2022-03-04 MED ORDER — HEPARIN BOLUS VIA INFUSION
1400.0000 [IU] | Freq: Once | INTRAVENOUS | Status: AC
  Administered 2022-03-04: 1400 [IU] via INTRAVENOUS
  Filled 2022-03-04: qty 1400

## 2022-03-04 MED ORDER — LACTATED RINGERS IV BOLUS
1000.0000 mL | Freq: Once | INTRAVENOUS | Status: AC
  Administered 2022-03-04: 1000 mL via INTRAVENOUS

## 2022-03-04 MED ORDER — ATORVASTATIN CALCIUM 80 MG PO TABS
80.0000 mg | ORAL_TABLET | Freq: Every day | ORAL | Status: DC
  Administered 2022-03-04 – 2022-03-06 (×3): 80 mg via ORAL
  Filled 2022-03-04: qty 4
  Filled 2022-03-04 (×2): qty 1

## 2022-03-04 MED ORDER — ASPIRIN 81 MG PO TBEC
81.0000 mg | DELAYED_RELEASE_TABLET | Freq: Every day | ORAL | Status: DC
  Administered 2022-03-04 – 2022-03-06 (×3): 81 mg via ORAL
  Filled 2022-03-04 (×3): qty 1

## 2022-03-04 MED ORDER — IPRATROPIUM-ALBUTEROL 0.5-2.5 (3) MG/3ML IN SOLN
3.0000 mL | Freq: Four times a day (QID) | RESPIRATORY_TRACT | Status: DC
  Administered 2022-03-04 (×3): 3 mL via RESPIRATORY_TRACT
  Filled 2022-03-04 (×3): qty 3

## 2022-03-04 MED ORDER — SODIUM CHLORIDE 0.9 % IV SOLN: 250.0000 mL | INTRAVENOUS | Status: DC

## 2022-03-04 MED ORDER — POLYETHYLENE GLYCOL 3350 17 G PO PACK
17.0000 g | PACK | Freq: Every day | ORAL | Status: DC | PRN
Start: 2022-03-04 — End: 2022-03-06

## 2022-03-04 MED ORDER — CARVEDILOL 3.125 MG PO TABS
3.1250 mg | ORAL_TABLET | Freq: Two times a day (BID) | ORAL | Status: DC
  Administered 2022-03-04 – 2022-03-05 (×2): 3.125 mg via ORAL
  Filled 2022-03-04 (×2): qty 1

## 2022-03-04 MED ORDER — LORAZEPAM 2 MG/ML IJ SOLN
2.0000 mg | Freq: Once | INTRAMUSCULAR | Status: AC
  Administered 2022-03-04: 2 mg via INTRAVENOUS
  Filled 2022-03-04: qty 1

## 2022-03-04 MED ORDER — HEPARIN (PORCINE) 25000 UT/250ML-% IV SOLN
1800.0000 [IU]/h | INTRAVENOUS | Status: DC
  Administered 2022-03-04: 1650 [IU]/h via INTRAVENOUS
  Administered 2022-03-04: 1450 [IU]/h via INTRAVENOUS
  Administered 2022-03-05 – 2022-03-06 (×2): 1800 [IU]/h via INTRAVENOUS
  Filled 2022-03-04 (×4): qty 250

## 2022-03-04 MED ORDER — DOCUSATE SODIUM 100 MG PO CAPS: 100.0000 mg | ORAL_CAPSULE | Freq: Two times a day (BID) | ORAL | Status: DC | PRN

## 2022-03-04 MED ORDER — PHENYLEPHRINE HCL-NACL 20-0.9 MG/250ML-% IV SOLN: 25.0000 ug/min | INTRAVENOUS | Status: DC

## 2022-03-04 MED ORDER — CHLORHEXIDINE GLUCONATE CLOTH 2 % EX PADS
6.0000 | MEDICATED_PAD | Freq: Every day | CUTANEOUS | Status: DC
Start: 2022-03-05 — End: 2022-03-05

## 2022-03-04 MED ORDER — PHENYLEPHRINE HCL-NACL 20-0.9 MG/250ML-% IV SOLN
25.0000 ug/min | INTRAVENOUS | Status: DC
  Filled 2022-03-04: qty 250

## 2022-03-04 MED ORDER — HEPARIN SODIUM (PORCINE) 5000 UNIT/ML IJ SOLN
5000.0000 [IU] | Freq: Three times a day (TID) | INTRAMUSCULAR | Status: DC
  Administered 2022-03-04: 5000 [IU] via SUBCUTANEOUS
  Filled 2022-03-04: qty 1

## 2022-03-04 MED ORDER — FENTANYL CITRATE PF 50 MCG/ML IJ SOSY
50.0000 ug | PREFILLED_SYRINGE | Freq: Once | INTRAMUSCULAR | Status: AC
  Administered 2022-03-04: 50 ug via INTRAVENOUS
  Filled 2022-03-04: qty 1

## 2022-03-04 NOTE — Progress Notes (Signed)
eLink Physician-Brief Progress Note Patient Name: Brad [name]. DOB: [birth year] MRN: [id number]   Date of Service  03/04/2022  HPI/Events of Note  Patient admitted with chest pain and atrial fibrillation with RVR in the context of poly-substance abuse including cocaine and marijuana, he was cardioverted in the ED secondary to Afib with RVR.  eICU Interventions  New Patient Evaluation.        Thomasene Lot Coralie Stanke 03/04/2022, 5:59 AM

## 2022-03-04 NOTE — Progress Notes (Signed)
PT Cancellation Note  Patient Details Name: Brad [name]. MRN: [id number] DOB: May 15, 1976   Cancelled Treatment:    Reason Eval/Treat Not Completed: Patient not medically ready.  Chart reviewed pt.  According to NP, "unclear if this reflects demand ischemia in the setting of atrial fibrillation with rapid ventricular response, heart failure, cardioversion, and acute kidney injury versus acute coronary syndrome".  Troponin still trending up, without clear reasoning and pt being on heparin, will hold at this time.     Nolon Bussing, PT, DPT 03/04/22, 4:15 PM

## 2022-03-04 NOTE — Progress Notes (Signed)
PHARMACY CONSULT NOTE  Pharmacy Consult for Electrolyte Monitoring and Replacement   Recent Labs: Potassium (mmol/L)  Date Value  03/04/2022 4.3   Magnesium (mg/dL)  Date Value  62/95/2841 2.1   Calcium (mg/dL)  Date Value  32/44/0102 9.7   Albumin (g/dL)  Date Value  72/53/6644 4.1  01/31/2022 4.6   Phosphorus (mg/dL)  Date Value  03/47/4259 3.4   Sodium (mmol/L)  Date Value  03/04/2022 141  05/10/2021 142    Assessment: 46 year old African-American male with a history of hypertension, congestive heart failure, CKD stage III and cocaine abuse who presented with new onset A-fib with RVR  Goal of Therapy:  Potassium 4.0 - 5.1 mmol/L Magnesium 2.0 - 2.4 mg/dL All Other Electrolytes WNL  Plan:  No electrolyte replacement warranted for today Recheck electrolytes in am  Lowella Bandy ,PharmD Clinical Pharmacist 03/04/2022 7:04 AM

## 2022-03-04 NOTE — ED Notes (Signed)
Shock of 200J given at this time

## 2022-03-04 NOTE — ED Provider Notes (Signed)
[facility] Provider Note    Event Date/Time   First MD Initiated Contact with Patient 03/04/22 0310     (approximate)   History   Chest Pain   HPI Level 5 caveat: History may be limited by the patient's critical illness.  Brad [name]. is a 46 y.o. male who reportedly has a history of congestive heart failure and goes to [facility] for care.  He presents by EMS for evaluation of chest pain.  He reports that the chest pain started acutely about an hour ago.  He thought it would get better but then it did not so he called EMS.  The patient has no history of atrial fibrillation but EMS noted that his heart rate was elevated in the 160s and that he was in A-fib with RVR.  They gave him metoprolol (either 3 mg or 5 mg) IV prior to arrival which slowed his heart rate briefly to the 120s but then went back up.  The patient denies a history of A-fib and denies having had a heart attack in the past but admits to CHF and prior cocaine abuse.  He also admits to using cocaine recently.  It is unclear at what point within the last couple of days used it but it was definitely within the last 2 days.  He denies any other drug or alcohol use.  He says he has never had chest pain like this before.  He is diaphoretic and anxious.  No nausea or vomiting.  Some shortness of breath.     Physical Exam   Triage Vital Signs: ED Triage Vitals  Enc Vitals Group     BP 03/04/22 0315 (!) 151/121     Pulse Rate 03/04/22 0308 60     Resp 03/04/22 0308 (!) 27     Temp 03/04/22 0328 (!) 97.4 F (36.3 C)     Temp Source 03/04/22 0328 Axillary     SpO2 03/04/22 0305 97 %     Weight 03/04/22 0309 121.1 kg (267 lb)     Height 03/04/22 0309 1.651 m (5\' 5" )     Head Circumference --      Peak Flow --      Pain Score 03/04/22 0308 8     Pain Loc --      Pain Edu? --      Excl. in GC? --     Most recent vital signs: Vitals:   03/04/22 0315 03/04/22 0328  BP: (!) 151/121    Pulse: (!) 137   Resp: (!) 31   Temp:  (!) 97.4 F (36.3 C)  SpO2: 97%      General: Toxic appearance but awake and alert.  Distressed. CV:  Decreased peripheral perfusion with clammy extremities.  Rapid heart rate. Resp:  Increased respiratory effort with some accessory muscle usage and tachypnea.  Lungs are clear to auscultation bilaterally. Abd:  Obese.  No tenderness to palpation the abdomen. Other:  Patient is agitated, seems a little bit somnolent but is also distressed.  Admits to drug use.   ED Results / Procedures / Treatments   Labs (all labs ordered are listed, but only abnormal results are displayed) Labs Reviewed  CBC WITH DIFFERENTIAL/PLATELET - Abnormal; Notable for the following components:      Result Value   RBC 5.91 (*)    MCH 25.5 (*)    RDW 16.0 (*)    All other components within normal limits  PROTIME-INR  COMPREHENSIVE METABOLIC PANEL  ETHANOL  MAGNESIUM  URINE DRUG SCREEN, QUALITATIVE ([facility] ONLY)  TROPONIN I (HIGH SENSITIVITY)     EKG  ED ECG REPORT #1 I, Loleta Rose, the attending physician, personally viewed and interpreted this ECG.  Date: 03/04/2022 EKG Time: 3:08 Rate: 155 Rhythm: atrial fibrillation w/ RVR QRS Axis: borderline RAD Intervals: abnormal due to a-fib, otherwise unremarkable ST/T Wave abnormalities: marked ST-segment depression, global, but most notable in lateral leads Narrative Interpretation: suggestive of demand ischemia, does not meet STEMI criteria  ED ECG REPORT #2 I, Loleta Rose, the attending physician, personally viewed and interpreted this ECG.  Date: 03/04/2022 EKG Time: 3:25 Rate: 146 Rhythm: atrial fibrillation w/ RVR QRS Axis: borderline RAD Intervals: abnormal due to a-fib, otherwise unremarkable ST/T Wave abnormalities: marked ST-segment depression, global, but most notable in lateral leads, but improved from prior Narrative Interpretation: suggestive of demand ischemia, does not meet STEMI  criteria  ED ECG REPORT #3 I, Loleta Rose, the attending physician, personally viewed and interpreted this ECG.  Date: 03/04/2022 EKG Time: 3:27 Rate: 140 Rhythm: atrial fibrillation w/ RVR QRS Axis: borderline RAD Intervals: abnormal due to a-fib, otherwise unremarkable ST/T Wave abnormalities: marked ST-segment depression, global, but most notable in lateral leads, stable from prior Narrative Interpretation: suggestive of demand ischemia, does not meet STEMI criteria  ED ECG REPORT #4 I, Loleta Rose, the attending physician, personally viewed and interpreted this ECG.  Date: 03/04/2022 EKG Time: 3:52 Rate: 91 Rhythm: sinus rhythm QRS Axis: normal Intervals: borderline prolonged PR interval ST/T Wave abnormalities: Non-specific ST segment / T-wave changes, suspect global ischemia Narrative Interpretation: no definitive evidence of acute ischemia; does not meet STEMI criteria.  ED ECG REPORT #5 I, Loleta Rose, the attending physician, personally viewed and interpreted this ECG.  Date: 03/04/2022 EKG Time: 3:53 Rate: 57 Rhythm: sinus bradycardia with 1st degree AV block QRS Axis: normal Intervals: PR interval prolonged at 232 ms ST/T Wave abnormalities: Non-specific ST segment / T-wave changes, persistent ST segment depression Narrative Interpretation: no definitive evidence of acute ischemia; does not meet STEMI criteria.  ED ECG REPORT #6 I, Loleta Rose, the attending physician, personally viewed and interpreted this ECG.  Date: 03/04/2022 EKG Time: 3:56 Rate: 77 Rhythm: sinus rhythm with 1st degere AV block QRS Axis: normal Intervals: normal ST/T Wave abnormalities: Non-specific ST segment / T-wave changes, but no clear evidence of acute ischemia, improved ST segment depression Narrative Interpretation: no definitive evidence of acute ischemia; does not meet STEMI criteria.   RADIOLOGY See hospital course for details: Patient has some pulmonary vascular  congestion throughout on his chest x-ray, no other acute abnormality.    PROCEDURES:  Critical Care performed: Yes, see critical care procedure note(s)  .Cardioversion  Date/Time: 03/04/2022 4:05 AM  Performed by: Loleta Rose, MD Authorized by: Loleta Rose, MD   Consent:    Consent obtained:  Emergent situation Pre-procedure details:    Cardioversion basis:  Emergent   Rhythm:  Atrial fibrillation   Electrode placement:  Anterior-posterior Patient sedated: No Attempt one:    Cardioversion mode:  Synchronous   Waveform:  Biphasic   Shock (Joules):  200   Shock outcome:  Conversion to normal sinus rhythm Post-procedure details:    Patient status:  Awake   Patient tolerance of procedure:  Tolerated well, no immediate complications .Critical Care  Performed by: Loleta Rose, MD Authorized by: Loleta Rose, MD   Critical care provider statement:    Critical care time (minutes):  60  Critical care time was exclusive of:  Separately billable procedures and treating other patients   Critical care was necessary to treat or prevent imminent or life-threatening deterioration of the following conditions:  Circulatory failure, cardiac failure and CNS failure or compromise   Critical care was time spent personally by me on the following activities:  Development of treatment plan with patient or surrogate, evaluation of patient's response to treatment, examination of patient, obtaining history from patient or surrogate, ordering and performing treatments and interventions, ordering and review of laboratory studies, ordering and review of radiographic studies, pulse oximetry, re-evaluation of patient's condition and review of old charts .1-3 Lead EKG Interpretation  Performed by: Loleta RoseForbach, Lachlan Pelto, MD Authorized by: Loleta RoseForbach, Rosalie Buenaventura, MD     Interpretation: abnormal     ECG rate:  160   ECG rate assessment: tachycardic     Rhythm: atrial fibrillation     Ectopy: none     Conduction:  normal   .1-3 Lead EKG Interpretation  Performed by: Loleta RoseForbach, Michial Disney, MD Authorized by: Loleta RoseForbach, Tanisa Lagace, MD     Interpretation: normal     ECG rate:  72   ECG rate assessment: normal     Rhythm: sinus rhythm     Ectopy: none     Conduction: normal      MEDICATIONS ORDERED IN ED: Medications  LORazepam (ATIVAN) injection 2 mg (2 mg Intravenous Given 03/04/22 0320)     IMPRESSION / MDM / ASSESSMENT AND PLAN / ED COURSE  I reviewed the triage vital signs and the nursing notes.                              Differential diagnosis includes, but is not limited to, new onset A-fib with RVR, CHF exacerbation, ACS, PE, cocaine abuse and subsequent complications.  Patient's presentation is most consistent with acute presentation with potential threat to life or bodily function.  Patient is somewhat toxic appearing given his degree of cardiovascular abnormality, respiratory difficulties, diaphoresis, etc.  I reviewed the medical record including his echocardiogram from 01/13/2021, and he reportedly has a preserved ejection fraction of 60 to 65%.  However I also verified that he follows up with Dr. Welton FlakesKhan with cardiology and [facility].  I suspect that his cocaine abuse has triggered new onset A-fib and resulting demand ischemia.  His EKG is, repeated several times for verification, do suggest A-fib with RVR and demand ischemia although this may simply be rate related.  He is agitated and this could be due to his acute cardiovascular illness, but I suspect cocaine may play a role as well.  He received metoprolol by EMS to little effect.  I am reluctant to provide a calcium channel blocker such as diltiazem given that his blood pressure is borderline at best and I do not want to provide both a beta-blocker and calcium channel blocker in the setting of recent cocaine abuse.  We also should try to preserve his preload.  I will treat the probable cocaine effects with  Ativan 2 mg IV.  He is on supplementary oxygen.  I ordered a one-view portable chest x-ray and viewed and interpreted the film myself.  It is suggestive of pulmonary vascular congestion throughout.  I ordered CBC with differential, high-sensitivity troponin, comprehensive metabolic panel, pro time-INR, ethanol level (to verify if any other substances such as alcohol or board which may alter his mentation).  I also ordered  a urine drug screen but he already admits to cocaine abuse.    The patient is on the cardiac monitor to evaluate for evidence of arrhythmia and/or significant heart rate changes.  Anticipate admission after stabilization.   Clinical Course as of 03/04/22 0533  Tue Mar 04, 2022  0342 Checked on patient, he remains somewhat anxious and seems like he cannot get comfortable.  He remains tachycardic with A-fib with RVR.  Blood pressure is difficult to measure because he was moving around, slumping over, resting his weight on his affected arm.  We are trying to get him resettled in bed or we can get a good blood pressure.  No significant clinical change after the Ativan 2 mg IV, although he is a bit more somnolent than he was before. [CF]  0356 Patient began decompensating with a blood pressure down in the 60s to 70s systolic.  He was also falling asleep with decreased mentation, difficult to appreciate whether is secondary to the Ativan, but I am concerned it is due to his A-fib with RVR resulting in unstable hemodynamics.  His heart rate was up to the 150s 160s, still in A-fib, with consistently low blood pressures in the 70 systolic after checking 3 times in both arms.  Given the urgency of the current situation, I was concerned that the safest thing for him was cardioversion given that he has become unstable and that ordering medication such as diltiazem may cause worsening hypertension.  I provided a dose of fentanyl 50 mcg IV for pain control and then perform synchronized  cardioversion at 200 J.  He converted to normal sinus rhythm and his mentation improved.  He stopped being diaphoretic.  Now he is just somnolent, but he wakes up to voice and light touch and says he feels better than before.  However, he remains hypotensive around 73/49, which gives him a map of 57.  I am concerned he may need peripheral Levophed, because he already has a degree of pulmonary vascular congestion on chest x-ray and I am concerned that continuing to load him with fluid boluses will be detrimental.  His oxygenation waxes and wanes because he is a large man and he is now asleep, but he is currently at 96% on 4 L supplemental oxygen by nasal cannula.  I am consulting the ICU to discuss admission. [CF]  0404 Labs are notable for what appears to be acute kidney injury with a creatinine of 2.48 which is above his baseline.  Electrolytes look normal.  High-sensitivity troponin is 41 consistent with demand ischemia. [CF]  0425 Consulted by phone with Charise Carwin, NP, who is staffing the ICU tonight.  We discussed the case in detail and she agreed to consult on the patient and admit him to the ICU.  She agreed with my assessment that he needs blood pressure support and recommended phenylephrine drip which she will continue in the ICU.  I used the peripheral vasopressor order set and ordered phenylephrine per protocol.  Patient remains hypotensive but relatively stable at this point, more so than before.  He will be transported to ICU as soon as a bed is available. [CF]    Clinical Course User Index [CF] Loleta Rose, MD     FINAL CLINICAL IMPRESSION(S) / ED DIAGNOSES   Final diagnoses:  New onset atrial fibrillation (HCC)  Atrial fibrillation with RVR (HCC)  Hypotension, unspecified hypotension type  Cocaine abuse (HCC)  Demand ischemia (HCC)  Chest pain, unspecified type  Pulmonary vascular congestion  Rx / DC Orders   ED Discharge Orders     None         Note:  This document was prepared using Dragon voice recognition software and may include unintentional dictation errors.   Loleta Rose, MD 03/04/22 [phone]

## 2022-03-04 NOTE — H&P (Signed)
PULMONARY / CRITICAL CARE MEDICINE  Name: Brad [name]. MRN: [id number] DOB: [birth year]    LOS: 0 Admitting Provider: Charise Carwin, NP-C Reason for Admission: New onset A-fib with RVR and cardiogenic shock s/p cardioversion Brief patient description: This is a 46 year old African-American male with a history of hypertension, congestive heart failure, CKD stage III and cocaine abuse who presented with new onset A-fib with RVR, failed to respond to IV antiarrhythmics and was cardioverted x1 in the ED.  Now in sinus rhythm.  HPI: [name] is a 46 year old African-American male with a history of congestive heart failure, hypertension, CKD stage III, tobacco use disorder, and cocaine abuse who presented to the ED via EMS with complaints of sudden onset chest pain that started at about 2 AM.  Patient states that he was watching TV and went to have a sandwich at home when suddenly he developed midsternal chest pain.  Pain progressed quickly and got severe hence EMS was called.  When EMS arrived, patient's heart rate was between 120 and 150 bpm and he reported that he used some cocaine.  He was given 3 mg of IV metoprolol without any effect.  Upon arrival in the ED, patient was dyspneic, diaphoretic and very anxious.  In the ED he quickly decompensated requiring emergent cardioversion.  He received 1 shock of 200 J.  He converted to sinus rhythm but became hypotensive requiring pressors. Upon arrival in the ICU, patient is still in normal sinus rhythm.  Chest pain has resolved.  His blood pressure has stabilized.  He reports improvement in dyspnea as well.  SIGNIFICANT EVENTS: 03/04/2022: Admitted with new onset A-fib with RVR requiring cardioversion; admitted to the ICU for cardiogenic shock  Past Medical History:  Diagnosis Date   Asthma    CHF (congestive heart failure) (HCC)    CKD (chronic kidney disease), stage III (HCC)    Cocaine abuse (HCC)    Diverticulitis    Hypertension     Tobacco use disorder    Past Surgical History:  Procedure Laterality Date   ANKLE SURGERY     No current facility-administered medications on file prior to encounter.   Current Outpatient Medications on File Prior to Encounter  Medication Sig   albuterol (VENTOLIN HFA) 108 (90 Base) MCG/ACT inhaler Inhale 2 puffs into the lungs every 4 (four) hours as needed for wheezing or shortness of breath.   amLODipine (NORVASC) 10 MG tablet Take 1 tablet (10 mg total) by mouth once daily.   aspirin EC 81 MG EC tablet Take 1 tablet (81 mg total) by mouth daily. Swallow whole.   furosemide (LASIX) 40 MG tablet Take 1 tablet (40 mg total) by mouth once daily.   hydrALAZINE (APRESOLINE) 100 MG tablet Take 1 tablet (100 mg total) by mouth 3 (three) times daily.   isosorbide mononitrate (IMDUR) 30 MG 24 hr tablet Take 1 tablet (30 mg total) by mouth once daily.   metoprolol succinate (TOPROL-XL) 25 MG 24 hr tablet Take 1 tablet (25 mg total) by mouth once daily. Take with or immediately following a meal. (Patient taking differently: Take 50 mg by mouth daily.)   rosuvastatin (CRESTOR) 20 MG tablet Take 1 tablet (20 mg total) by mouth once daily.   sacubitril-valsartan (ENTRESTO) 49-51 MG Take one tablet by mouth twice a day   sacubitril-valsartan (ENTRESTO) 97-103 MG Take 1 tablet by mouth 2 (two) times daily.    Allergies No Known Allergies  Family History Family History  Problem Relation  Age of Onset   Hypertension Mother    Social History  reports that he has been smoking cigarettes. He has been smoking an average of 1 pack per day. He has never used smokeless tobacco. He reports current alcohol use. He reports current drug use. Drugs: Marijuana and Cocaine.  Review Of Systems:   Constitutional: Negative for fever and chills.  HENT: Negative for congestion and rhinorrhea.   Eyes: Negative for redness and visual disturbance.  Respiratory: Negative for wheezing.  Had dyspnea but this has  resolved Cardiovascular: Positive for chest pain and palpitations but both have resolved.  Gastrointestinal: Negative  for nausea , vomiting and abdominal pain and  Loose stools Genitourinary: Negative for dysuria and urgency.  Endocrine: Denies polyuria, polyphagia and heat intolerance Musculoskeletal: Negative for myalgias and arthralgias.  Skin: Negative for pallor and wound.  Neurological: Negative for dizziness and headaches    VITAL SIGNS: BP (!) 176/91   Pulse 75   Temp 98 F (36.7 C) (Oral)   Resp (!) 30   Ht 5\' 6"  (1.676 m)   Wt 124.7 kg   SpO2 97%   BMI 44.37 kg/m   HEMODYNAMICS:    VENTILATOR SETTINGS:    INTAKE / OUTPUT: I/O last 3 completed shifts: In: 560 [P.O.:60; IV Piggyback:500] Out: -   PHYSICAL EXAMINATION: General: Well-nourished, well-developed African-American male, lying in bed, in no acute distress HEENT: PERRLA, trachea midline, no JVD Neuro: Alert and oriented x4, moves all extremities, no focal deficits Cardiovascular: Apical pulse regular, S1-S2, no murmur regurg or gallop Lungs: Bilateral breath sounds, diminished in the bases, anterior lung fields with coarse rhonchi Abdomen: Nondistended, normal bowel sounds in all 4 quadrants, palpation reveals no tenderness, no rebound tenderness and no organomegaly Musculoskeletal: Positive range of motion in bilateral upper and lower extremities no deformities Skin: Skin is warm and dry, not diaphoretic Extremities: +2 pulses bilaterally, lower extremities without edema  LABS:  BMET Recent Labs  Lab 03/04/22 0309 03/04/22 0615  NA 141 141  K 4.3 3.4*  CL 107 109  CO2 19* 23  BUN 23* 25*  CREATININE 2.48* 2.42*  GLUCOSE 156* 146*    Electrolytes Recent Labs  Lab 03/04/22 0309 03/04/22 0615  CALCIUM 9.7 9.3  MG 2.1 2.1  PHOS  --  3.6    CBC Recent Labs  Lab 03/04/22 0309 03/04/22 0615  WBC 10.2 9.5  HGB 15.1 13.7  HCT 47.8 42.9  PLT 327 242    Coag's Recent Labs  Lab  03/04/22 0309  INR 1.0    Sepsis Markers No results for input(s): "LATICACIDVEN", "PROCALCITON", "O2SATVEN" in the last 168 hours.  ABG No results for input(s): "PHART", "PCO2ART", "PO2ART" in the last 168 hours.  Liver Enzymes Recent Labs  Lab 03/04/22 0309  AST 24  ALT 12  ALKPHOS 56  BILITOT 1.4*  ALBUMIN 4.1    Cardiac Enzymes No results for input(s): "TROPONINI", "PROBNP" in the last 168 hours.  Glucose Recent Labs  Lab 03/04/22 0534  GLUCAP 96    Imaging DG Chest Port 1 View  Result Date: 03/04/2022 CLINICAL DATA:  acute dyspnea and chest pain EXAM: PORTABLE CHEST 1 VIEW COMPARISON:  04/10/2021 FINDINGS: Cardiomegaly, vascular congestion. No overt edema. No confluent opacities or effusions. No acute bony abnormality. IMPRESSION: Cardiomegaly, vascular congestion. Electronically Signed   By: Rolm Baptise M.D.   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  Now in sinus rhythm  ASSESSMENT / PLAN:  PULMONARY A: Tobacco use disorder Unable sleep apnea History of asthma P:   -Patient reports being referred for a sleep study but has not had one done yet.  Given the shape of his neck it is very likely that he might have undiagnosed and untreated sleep apnea.  Given his worsening cardiopulmonary profile it would be really imperative that he get screened for obstructive sleep apnea -Smoking cessation advice given.  Patient started on a nicotine patch -DuoNeb every 6 hours -Wean off supplemental oxygen   CARDIOVASCULAR A:  New onset A-fib with RVR s/p cardioversion Cardiogenic shock s/p cardioversion Congestive heart failure History of hypertension P:   -Heparin infusion per pharmacy protocol - Cycle cardiac enzymes - Monitor and correct electrolyte abnormalities - Continuous telemetry - 2D echo - Cardiology consult - Hold all antihypertensives in light of hypotension - Neo-Synephrine infusion, titrate to maintain mean arterial blood pressure 65 and above -Avoid beta-blockers for now until seen and evaluated by cardiology  RENAL A:   Chronic kidney disease stage III P:   -Trend renal indicis - Avoid nephrotoxic drugs - Monitor and correct electrolyte abnormalities  ENDOCRINE A:   Mild hyperglycemia-blood glucose 146 mg/dL P:   -No prior history of diabetes - Obtain hemoglobin A1c level  NEUROLOGIC A:   Agitation P:   -Likely due to a combination of cocaine abuse, chest pain and hypotension; resolved -Monitor for signs of withdrawal.  Patient indicates that his last drink was on Thursday.  Best Practice: Code Status: Full code Diet: Heart healthy diet; advance as tolerated GI prophylaxis: Not indicated VTE prophylaxis: Patient is on full-strength anticoagulants Advance directives/family updated: His mother is his healthcare proxy and primary contact.  No family at bedside.  Patient updated on current treatment plan.  All questions answered.  Plan of care discussed with cardiology at bedside as well as on-coming pulmonary critical care team.  Critical care time 45 minutes  Zethan Alfieri S. [facility] ANP-BC Pulmonary and Critical Care Medicine [facility] Pager [phone] or [phone]  NB: This document was prepared using Dragon voice recognition software and may include unintentional dictation errors.    03/04/2022, 8:11 AM

## 2022-03-04 NOTE — Progress Notes (Signed)
Uneventful day. Echo done. Mom in to visit. Started on Heparin IV drip.

## 2022-03-04 NOTE — Progress Notes (Signed)
ANTICOAGULATION CONSULT NOTE  Pharmacy Consult for heparin infusion initiation and monitoring  Indication: NEW ONSET AFIB S/P CARDIOVERSION  No Known Allergies  Patient Measurements: Height: 5\' 6"  (167.6 cm) Weight: 124.7 kg (274 lb 14.6 oz) IBW/kg (Calculated) : 63.8 Heparin Dosing Weight: 93.2  Vital Signs: Temp: 98 F (36.7 C) (07/11 0540) Temp Source: Oral (07/11 0540) BP: 176/91 (07/11 0700) Pulse Rate: 75 (07/11 0700)  Labs: Recent Labs    03/04/22 0309 03/04/22 0615  HGB 15.1 13.7  HCT 47.8 42.9  PLT 327 242  LABPROT 12.8  --   INR 1.0  --   CREATININE 2.48* 2.42*  TROPONINIHS 41*  --     Estimated Creatinine Clearance: 47.6 mL/min (A) (by C-G formula based on SCr of 2.42 mg/dL (H)).   Medical History: Past Medical History:  Diagnosis Date   Asthma    CHF (congestive heart failure) (HCC)    Diverticulitis    Hypertension     Medications:  Scheduled:   heparin  5,000 Units Subcutaneous Q8H   ipratropium-albuterol  3 mL Nebulization Q6H    Assessment: 46 year old African-American male with a history of hypertension, congestive heart failure, CKD stage III and cocaine abuse who presented with new onset A-fib with RVR. A review of medical records reveals no chronic anticoagulation.  Goal of Therapy:  Heparin level 0.3-0.7 units/ml Monitor platelets by anticoagulation protocol: Yes   Plan:  Give 5000 units bolus x 1 Start heparin infusion at 1450 units/hr Check anti-Xa level in 6 hours and daily while on heparin Continue to monitor H&H and platelets  49 03/04/2022,7:29 AM

## 2022-03-04 NOTE — TOC Initial Note (Signed)
Transition of Care (TOC) - Initial/Assessment Note    Patient Details  Name: Brad [name]. MRN: [id number] Date of Birth: [birth year]  Transition of Care [facility]) CM/SW Contact:    Shelbie Hutching, RN Phone Number: 03/04/2022, 3:17 PM  Clinical Narrative:                 Patient is admitted to the hospital with cardiogenic shock.  RNCM met with patient at the bedside, patient's mother, sister, and nephew are at the bedside.   RNCM introduced self to patient and family and explained role in DC planning.    Patient lives in [place] in an apartment with his mother.  Patient is not currently working, he does not have insurance.  Patient does have a PCP and Cardiologist.  PCP is in [place], Dr. Edwina Barth, patient goes to the [facility] Clinic here with [facility].  Patient gets his prescriptions from what is formerly known [facility].   Patient's mother provides transportation.  No needs or questions identified at this time.  TOC signed off.  Expected Discharge Plan: Home/Self Care Barriers to Discharge: Continued Medical Work up   Patient Goals and CMS Choice Patient states their goals for this hospitalization and ongoing recovery are:: to get better and go home      Expected Discharge Plan and Services Expected Discharge Plan: Home/Self Care   Discharge Planning Services: CM Consult   Living arrangements for the past 2 months: Apartment                 DME Arranged: N/A DME Agency: NA       HH Arranged: NA          Prior Living Arrangements/Services Living arrangements for the past 2 months: Apartment Lives with:: Parents Patient language and need for interpreter reviewed:: Yes Do you feel safe going back to the place where you live?: Yes      Need for Family Participation in Patient Care: Yes (Comment) Care giver support system in place?: Yes (comment) (mother sister)   Criminal Activity/Legal Involvement Pertinent to Current  Situation/Hospitalization: No - Comment as needed  Activities of Daily Living Home Assistive Devices/Equipment: None ADL Screening (condition at time of admission) Patient's cognitive ability adequate to safely complete daily activities?: Yes Is the patient deaf or have difficulty hearing?: No Does the patient have difficulty seeing, even when wearing glasses/contacts?: No Does the patient have difficulty concentrating, remembering, or making decisions?: No Patient able to express need for assistance with ADLs?: Yes Does the patient have difficulty dressing or bathing?: No Independently performs ADLs?: Yes (appropriate for developmental age) Does the patient have difficulty walking or climbing stairs?: No Weakness of Legs: None Weakness of Arms/Hands: None  Permission Sought/Granted Permission sought to share information with : Case Manager, Family Supports Permission granted to share information with : Yes, Verbal Permission Granted  Share Information with NAME: Barack Nicodemus     Permission granted to share info w Relationship: sister  Permission granted to share info w Contact Information: [phone]  Emotional Assessment Appearance:: Appears stated age Attitude/Demeanor/Rapport: Engaged Affect (typically observed): Accepting Orientation: : Oriented to Self, Oriented to Place, Oriented to  Time, Oriented to Situation Alcohol / Substance Use: Illicit Drugs Psych Involvement: No (comment)  Admission diagnosis:  Cardiogenic shock ([place]) [R57.0] Cocaine abuse ([place]) [F14.10] New onset atrial fibrillation ([place]) [I48.91] Demand ischemia ([place]) [I24.8] Atrial fibrillation with RVR ([place]) [I48.91] Pulmonary vascular congestion [R09.89] Hypotension, unspecified hypotension type [I95.9] Chest pain,  unspecified type [R07.9] Patient Active Problem List   Diagnosis Date Noted   Cardiogenic shock ([place]) 03/04/2022   New onset atrial fibrillation (HCC)    Chronic pain of right ankle  05/10/2021   Obesity, Class III, BMI 40-49.9 (morbid obesity) ([place]) 04/10/2021   Hypertensive emergency 04/10/2021   Acute on chronic diastolic CHF (congestive heart failure) ([place]) 02/15/2021   Hypertensive kidney disease with stage 3a chronic kidney disease ([place]) 02/15/2021   Type 2 diabetes mellitus with morbid obesity ([place]) 02/15/2021   Tobacco dependence 02/15/2021   Cocaine abuse in remission ([place]) 02/15/2021   Marijuana user 02/15/2021   Acute pulmonary edema ([place])    Acute heart failure ([place]) 01/12/2021   Asthma    Hypertension    Cocaine abuse ([place]) 04/17/2020   Pneumonia due to COVID-19 virus 04/16/2020   Acute hypoxemic respiratory failure due to COVID-19 ([place]) 04/16/2020   Benign essential HTN 04/16/2020   CKD (chronic kidney disease), stage IIIa 04/16/2020   Elevated troponin 04/16/2020   Diverticulitis 10/06/2018   Hypertensive urgency 04/18/2016   Intractable nausea and vomiting 05/31/2015   PCP:  Ladell Pier, MD Pharmacy:   [facility] [place] [address] Phone: [phone] Fax: [phone]     Social Determinants of Health (SDOH) Interventions    Readmission Risk Interventions    04/12/2021    3:10 PM  Readmission Risk Prevention Plan  Transportation Screening Complete  PCP or Specialist Appt within 3-5 Days Complete  HRI or [place] Complete  Social Work Consult for [place] Planning/Counseling Complete  Palliative Care Screening Complete  Medication Review Press photographer) Complete

## 2022-03-04 NOTE — ED Triage Notes (Signed)
Pt arrives via [facility] with CC of chest pain that started at 0200 while watching TV. EMS gave 3mg  metoprolol and HR went to 120 but has returned to 150s. Pt endorses cocaine use.

## 2022-03-04 NOTE — Plan of Care (Signed)
?  Problem: Activity: ?Goal: Risk for activity intolerance will decrease ?Outcome: Progressing ?  ?Problem: Safety: ?Goal: Ability to remain free from injury will improve ?Outcome: Progressing ?  ?Problem: Pain Managment: ?Goal: General experience of comfort will improve ?Outcome: Progressing ?  ?

## 2022-03-04 NOTE — Progress Notes (Signed)
*  PRELIMINARY RESULTS* Echocardiogram 2D Echocardiogram has been performed.  Cristela Blue 03/04/2022, 1:21 PM

## 2022-03-04 NOTE — Progress Notes (Signed)
ANTICOAGULATION CONSULT NOTE  Pharmacy Consult for heparin infusion initiation and monitoring  Indication: NEW ONSET AFIB S/P CARDIOVERSION  No Known Allergies  Patient Measurements: Height: 5\' 6"  (167.6 cm) Weight: 124.7 kg (274 lb 14.6 oz) IBW/kg (Calculated) : 63.8 Heparin Dosing Weight: 93.2  Vital Signs: Temp: 98 F (36.7 C) (07/11 0540) Temp Source: Oral (07/11 0540) BP: 161/82 (07/11 1100) Pulse Rate: 78 (07/11 1100)  Labs: Recent Labs    03/04/22 0309 03/04/22 0615 03/04/22 0817 03/04/22 1232 03/04/22 1453  HGB 15.1 13.7  --   --   --   HCT 47.8 42.9  --   --   --   PLT 327 242  --   --   --   APTT  --   --  31  --   --   LABPROT 12.8  --   --   --   --   INR 1.0  --   --   --   --   HEPARINUNFRC  --   --   --   --  0.25*  CREATININE 2.48* 2.42*  --   --   --   TROPONINIHS 41* 475* 1,192* 2,103*  --      Estimated Creatinine Clearance: 47.6 mL/min (A) (by C-G formula based on SCr of 2.42 mg/dL (H)).   Medical History: Past Medical History:  Diagnosis Date   Asthma    CHF (congestive heart failure) (HCC)    CKD (chronic kidney disease), stage III (HCC)    Cocaine abuse (HCC)    Diverticulitis    Hypertension    Tobacco use disorder     Medications:  Scheduled:   aspirin EC  81 mg Oral Daily   atorvastatin  80 mg Oral Daily   carvedilol  3.125 mg Oral BID WC   ipratropium-albuterol  3 mL Nebulization Q6H   nicotine  21 mg Transdermal Daily    Assessment: 46 year old African-American male with a history of hypertension, congestive heart failure, CKD stage III and cocaine abuse who presented with new onset A-fib with RVR. A review of medical records reveals no chronic anticoagulation.  Goal of Therapy:  Heparin level 0.3-0.7 units/ml Monitor platelets by anticoagulation protocol: Yes   Plan:  Heparin level SUBtherapeutic: Give 1400 units bolus x 1, then increase heparin infusion rate to 1650 units/hr reheck anti-Xa level in 6 hours after  rate change Continue to monitor H&H and platelets  49 03/04/2022,3:28 PM

## 2022-03-04 NOTE — Consult Note (Signed)
Cardiology Consultation:   Patient ID: Brad [name]. MRN: [id number]; DOB: 1976-07-05  Admit date: 03/04/2022 Date of Consult: 03/04/2022  PCP:  Ladell Pier, MD   [facility] Providers Cardiologist:  Nelva Bush, MD        Patient Profile:   Brad [name]. is a 46 y.o. male with a hx of asthma, congestive heart failure, CKD III, cocaine abuse, hypertension, and marijuana abuse who is being seen 03/04/2022 for the evaluation of chest pain and atrial fibrillation s/p cardioversion in the emergency department at the request of M Tukov Yual, NP-C.Marland Kitchen  History of Present Illness:   Mr. Lerew has a complex medical history of congestive heart failue who is followed by T. Hackney, NP, CKD III, cocaine abuse, hypertension, and marijuana abuse who presented to the emergency department for chest pain and was found to be in atrial fibrillation RVR. His chest pain started shortly prior to his arrival. He described it as a fluttering located mid sternal that did not radiate. He found no aggravating or alleviating factors Since it had continued he called EMS. On presentation to the emergency department he was found to be in atrial fibrillation RVR with rates of the 160's. EMS had given him metoprolol IV which only slowed his heart rate briefly to the 120's. He stats that he had used cocaine 2 past prior and daily marijuana use, but denies any other illicit drugs or alcohol use. According to emergency department records he was dyspneic, diaphoretic, and extremely anxious. He then underwent emergent cardioversion and ws shocked with 200 J x 1 and successfully converted to sinus rhythm. After the cardioversion he was hypotensive and required pressors which were later able to be weaned off in the ICU. His chest pain had also resolved.  Initial vital signs: blood pressure 151/121, pulse 137, resp rate 31, O2 sats 97%, temp 97.4  Pertinent labs: creatinine 2.48, BUN 23, Hs trop 41, RBC  5.91, BNP 713  Medications: ativan 2 mg IVP, fentanyl 50 mcg IVP, phenylephrine drip  Imaging: CXR suggestive of pulmonary vascular congestion  Past Medical History:  Diagnosis Date   Asthma    CHF (congestive heart failure) (HCC)    CKD (chronic kidney disease), stage III (HCC)    Cocaine abuse ([place])    Diverticulitis    Hypertension    Tobacco use disorder     Past Surgical History:  Procedure Laterality Date   ANKLE SURGERY       Home Medications:  Prior to Admission medications   Medication Sig Start Date End Date Taking? Authorizing Provider  albuterol (VENTOLIN HFA) 108 (90 Base) MCG/ACT inhaler Inhale 2 puffs into the lungs every 4 (four) hours as needed for wheezing or shortness of breath. 01/13/21   Lorella Nimrod, MD  amLODipine (NORVASC) 10 MG tablet Take 1 tablet (10 mg total) by mouth once daily. 12/20/21   Alisa Graff, FNP  aspirin EC 81 MG EC tablet Take 1 tablet (81 mg total) by mouth daily. Swallow whole. 01/14/21   Lorella Nimrod, MD  furosemide (LASIX) 40 MG tablet Take 1 tablet (40 mg total) by mouth once daily. 12/20/21   Alisa Graff, FNP  hydrALAZINE (APRESOLINE) 100 MG tablet Take 1 tablet (100 mg total) by mouth 3 (three) times daily. 12/20/21   Alisa Graff, FNP  isosorbide mononitrate (IMDUR) 30 MG 24 hr tablet Take 1 tablet (30 mg total) by mouth once daily. 01/21/22   Alisa Graff, FNP  metoprolol succinate (TOPROL-XL) 25 MG 24 hr tablet Take 1 tablet (25 mg total) by mouth once daily. Take with or immediately following a meal. Patient taking differently: Take 50 mg by mouth daily. 12/20/21   Delma Freeze, FNP  rosuvastatin (CRESTOR) 20 MG tablet Take 1 tablet (20 mg total) by mouth once daily. 02/01/22 02/01/23  Marcine Matar, MD  sacubitril-valsartan (ENTRESTO) 49-51 MG Take one tablet by mouth twice a day 11/06/21     sacubitril-valsartan (ENTRESTO) 97-103 MG Take 1 tablet by mouth 2 (two) times daily. 12/30/21   Delma Freeze, FNP     Inpatient Medications: Scheduled Meds:  ipratropium-albuterol  3 mL Nebulization Q6H   nicotine  21 mg Transdermal Daily   Continuous Infusions:  sodium chloride     sodium chloride     heparin 1,450 Units/hr (03/04/22 0859)   phenylephrine (NEO-SYNEPHRINE) Adult infusion     PRN Meds: docusate sodium, ondansetron (ZOFRAN) IV, polyethylene glycol  Allergies:   No Known Allergies  Social History:   Social History   Socioeconomic History   Marital status: Single    Spouse name: Not on file   Number of children: Not on file   Years of education: Not on file   Highest education level: Not on file  Occupational History   Not on file  Tobacco Use   Smoking status: Every Day    Packs/day: 1.00    Types: Cigarettes   Smokeless tobacco: Never  Substance and Sexual Activity   Alcohol use: Yes    Comment: occas   Drug use: Yes    Types: Marijuana, Cocaine    Comment: daily, cocaine not as often   Sexual activity: Not on file  Other Topics Concern   Not on file  Social History Narrative   Not on file   Social Determinants of Health   Financial Resource Strain: Not on file  Food Insecurity: Not on file  Transportation Needs: Not on file  Physical Activity: Not on file  Stress: Not on file  Social Connections: Not on file  Intimate Partner Violence: Not on file    Family History:    Family History  Problem Relation Age of Onset   Hypertension Mother      ROS:  Please see the history of present illness.  Review of Systems  Constitutional:  Positive for malaise/fatigue.  HENT: Negative.    Eyes: Negative.   Respiratory: Negative.    Cardiovascular:  Positive for chest pain and palpitations.  Gastrointestinal: Negative.   Genitourinary: Negative.   Musculoskeletal: Negative.   Skin: Negative.   Neurological: Negative.   Endo/Heme/Allergies: Negative.   Psychiatric/Behavioral: Negative.      All other ROS reviewed and negative.     Physical  Exam/Data:   Vitals:   03/04/22 0500 03/04/22 0540 03/04/22 0600 03/04/22 0700  BP: (!) 84/51 131/85 (!) 135/113 (!) 176/91  Pulse: 73 78 81 75  Resp: (!) 27 (!) 24 (!) 26 (!) 30  Temp:  98 F (36.7 C)    TempSrc:  Oral    SpO2: 94% 97% 96% 97%  Weight:  124.7 kg    Height:  5\' 6"  (1.676 m)      Intake/Output Summary (Last 24 hours) at 03/04/2022 1008 Last data filed at 03/04/2022 0540 Gross per 24 hour  Intake 560 ml  Output --  Net 560 ml      03/04/2022    5:40 AM 03/04/2022    3:09 AM 01/31/2022  2:01 PM  Last 3 Weights  Weight (lbs) 274 lb 14.6 oz 267 lb 269 lb 9.6 oz  Weight (kg) 124.7 kg 121.11 kg 122.29 kg     Body mass index is 44.37 kg/m.  General:  Well nourished, well developed, in no acute distress HEENT: normal Neck: no JVD Vascular: No carotid bruits; Distal pulses 2+ bilaterally Cardiac:  normal S1, S2; RRR; no murmur  Lungs:  clear to auscultation bilaterally, no wheezing, rhonchi or rales  Abd: soft, nontender, no hepatomegaly  Ext: no edema Musculoskeletal:  No deformities, BUE and BLE strength normal and equal Skin: warm and dry  Neuro:  CNs 2-12 intact, no focal abnormalities noted Psych:  Normal affect   EKG:  The EKG was personally reviewed and demonstrates:  sinus with first degree avb rate of 77 with LVH and early repolarization. Telemetry:  Telemetry was personally reviewed and demonstrates:  sinus rte of 70-80  Relevant CV Studies: Echocardiogram completed 01/13/21  1. Left ventricular ejection fraction, by estimation, is 60 to 65%. The left ventricle has normal function. The left ventricle has no regional wall motion abnormalities. There is severe concentric left ventricular hypertrophy. Left ventricular diastolic  parameters are consistent with Grade I diastolic dysfunction (impaired relaxation).   2. Right ventricular systolic function is normal. The right ventricular size is normal.   3. The mitral valve is normal in structure. No  evidence of mitral valve regurgitation. No evidence of mitral stenosis.   4. The aortic valve is normal in structure. Aortic valve regurgitation is not visualized. No aortic stenosis is present.   5. The inferior vena cava is normal in size with greater than 50% respiratory variability, suggesting right atrial pressure of 3 mmHg.   FINDINGS   Laboratory Data:  High Sensitivity Troponin:   Recent Labs  Lab 03/04/22 0309 03/04/22 0615 03/04/22 0817  TROPONINIHS 41* 475* 1,192*     Chemistry Recent Labs  Lab 03/04/22 0309 03/04/22 0615  NA 141 141  K 4.3 3.4*  CL 107 109  CO2 19* 23  GLUCOSE 156* 146*  BUN 23* 25*  CREATININE 2.48* 2.42*  CALCIUM 9.7 9.3  MG 2.1 2.1  GFRNONAA 32* 33*  ANIONGAP 15 9    Recent Labs  Lab 03/04/22 0309  PROT 8.1  ALBUMIN 4.1  AST 24  ALT 12  ALKPHOS 56  BILITOT 1.4*   Lipids No results for input(s): "CHOL", "TRIG", "HDL", "LABVLDL", "LDLCALC", "CHOLHDL" in the last 168 hours.  Hematology Recent Labs  Lab 03/04/22 0309 03/04/22 0615  WBC 10.2 9.5  RBC 5.91* 5.29  HGB 15.1 13.7  HCT 47.8 42.9  MCV 80.9 81.1  MCH 25.5* 25.9*  MCHC 31.6 31.9  RDW 16.0* 15.7*  PLT 327 242   Thyroid No results for input(s): "TSH", "FREET4" in the last 168 hours.  BNP Recent Labs  Lab 03/04/22 0309  BNP 713.0*    DDimer No results for input(s): "DDIMER" in the last 168 hours.   Radiology/Studies:  DG Chest Port 1 View  Result Date: 03/04/2022 CLINICAL DATA:  acute dyspnea and chest pain EXAM: PORTABLE CHEST 1 VIEW COMPARISON:  04/10/2021 FINDINGS: Cardiomegaly, vascular congestion. No overt edema. No confluent opacities or effusions. No acute bony abnormality. IMPRESSION: Cardiomegaly, vascular congestion. Electronically Signed   By: Charlett Nose M.D.   On: 03/04/2022 03:35     Assessment and Plan:   New onset atrial fibrillation RVR s/p cardioversion -currently sinus on bedside cardiac monitor -Heparin drip for chadsvasc  2 -Echocardiogram ordered and pending -TSH added on to labs -continue cardiac monitor -EKG PRN -Mg 2.1 -patient had been referred for sleep study but as yet to have his test -recommend restarting home toprol 25 mg daily for rate control   2.  Elevated Hs troponins -currently pain free -troponins trended 41, 475, 1192  3.  Chronic diastolic congestive heart failure -recommend restarting home medications as tolerated and kidney function allows -BNP 713 -follows with Rolena Infante, NP in Heart failure clinic regularly -echocardiogram ordered and pending -daily weight, strict I&O, low sodium diet  4.  Hypertension -blood pressure this morning 176/91 -hypotension after cardioversion requiring pressors that have been weaned to off -current home medications are on hold -recommend restarting home medications  5     CKD III -creatinine today 2.48, baseline 1.9-2 -avoid nephrotoxic drugs -monitor/trend/replace as needed  6.    Polysubstance abuse -smoking cessation, continue Nicoderm patch -recommended total cessation of cocaine, alcohol, and marijuana  Risk Assessment/Risk Scores:     HEAR Score (for undifferentiated chest pain):     New York Heart Association (NYHA) Functional Class NYHA Class II  CHA2DS2-VASc Score = 2   This indicates a 2.2% annual risk of stroke. The patient's score is based upon: CHF History: 1 HTN History: 1 Diabetes History: 0 Stroke History: 0 Vascular Disease History: 0 Age Score: 0 Gender Score: 0         For questions or updates, please contact [place] Please consult [email or web address] for contact info under    Signed, Adrianna Dudas, NP  03/04/2022 10:08 AM

## 2022-03-04 NOTE — Progress Notes (Signed)
An USGPIV (ultrasound guided PIV) has been placed for short-term vasopressor infusion. A correctly placed ivWatch must be used when administering Vasopressors. Should this treatment be needed beyond 72 hours, central line access should be obtained.  It will be the responsibility of the bedside nurse to follow best practice to prevent extravasations.   ?

## 2022-03-05 ENCOUNTER — Inpatient Hospital Stay: Payer: Self-pay

## 2022-03-05 DIAGNOSIS — I4891 Unspecified atrial fibrillation: Secondary | ICD-10-CM

## 2022-03-05 DIAGNOSIS — R570 Cardiogenic shock: Secondary | ICD-10-CM

## 2022-03-05 DIAGNOSIS — I214 Non-ST elevation (NSTEMI) myocardial infarction: Secondary | ICD-10-CM

## 2022-03-05 DIAGNOSIS — R778 Other specified abnormalities of plasma proteins: Secondary | ICD-10-CM

## 2022-03-05 LAB — MAGNESIUM: Magnesium: 2.1 mg/dL (ref 1.7–2.4)

## 2022-03-05 LAB — BASIC METABOLIC PANEL
Anion gap: 8 (ref 5–15)
BUN: 30 mg/dL — ABNORMAL HIGH (ref 6–20)
CO2: 25 mmol/L (ref 22–32)
Calcium: 8.8 mg/dL — ABNORMAL LOW (ref 8.9–10.3)
Chloride: 110 mmol/L (ref 98–111)
Creatinine, Ser: 1.88 mg/dL — ABNORMAL HIGH (ref 0.61–1.24)
GFR, Estimated: 44 mL/min — ABNORMAL LOW (ref >60)
Glucose, Bld: 126 mg/dL — ABNORMAL HIGH (ref 70–99)
Potassium: 4.2 mmol/L (ref 3.5–5.1)
Sodium: 143 mmol/L (ref 135–145)

## 2022-03-05 LAB — CBC
HCT: 43.1 % (ref 39.0–52.0)
Hemoglobin: 13.5 g/dL (ref 13.0–17.0)
MCH: 25.5 pg — ABNORMAL LOW (ref 26.0–34.0)
MCHC: 31.3 g/dL (ref 30.0–36.0)
MCV: 81.5 fL (ref 80.0–100.0)
Platelets: 241 10*3/uL (ref 150–400)
RBC: 5.29 MIL/uL (ref 4.22–5.81)
RDW: 15.8 % — ABNORMAL HIGH (ref 11.5–15.5)
WBC: 7.7 10*3/uL (ref 4.0–10.5)
nRBC: 0 % (ref 0.0–0.2)

## 2022-03-05 LAB — HEPARIN LEVEL (UNFRACTIONATED)
Heparin Unfractionated: 0.34 IU/mL (ref 0.30–0.70)
Heparin Unfractionated: 0.34 IU/mL (ref 0.30–0.70)

## 2022-03-05 LAB — URINE DRUG SCREEN, QUALITATIVE (ARMC ONLY)
Amphetamines, Ur Screen: NOT DETECTED
Barbiturates, Ur Screen: NOT DETECTED
Benzodiazepine, Ur Scrn: NOT DETECTED
Cannabinoid 50 Ng, Ur ~~LOC~~: POSITIVE — AB
Cocaine Metabolite,Ur ~~LOC~~: POSITIVE — AB
MDMA (Ecstasy)Ur Screen: NOT DETECTED
Methadone Scn, Ur: NOT DETECTED
Opiate, Ur Screen: NOT DETECTED
Phencyclidine (PCP) Ur S: NOT DETECTED
Tricyclic, Ur Screen: NOT DETECTED

## 2022-03-05 LAB — MRSA NEXT GEN BY PCR, NASAL: MRSA by PCR Next Gen: NOT DETECTED

## 2022-03-05 LAB — TROPONIN I (HIGH SENSITIVITY): Troponin I (High Sensitivity): 1510 ng/L (ref <18)

## 2022-03-05 MED ORDER — HEPARIN BOLUS VIA INFUSION
1400.0000 [IU] | Freq: Once | INTRAVENOUS | Status: AC
Start: 2022-03-05 — End: 2022-03-05
  Administered 2022-03-05: 1400 [IU] via INTRAVENOUS
  Filled 2022-03-05: qty 1400

## 2022-03-05 MED ORDER — ISOSORBIDE MONONITRATE ER 30 MG PO TB24
30.0000 mg | ORAL_TABLET | Freq: Every day | ORAL | Status: DC
  Administered 2022-03-05 – 2022-03-06 (×2): 30 mg via ORAL
  Filled 2022-03-05 (×2): qty 1

## 2022-03-05 MED ORDER — IPRATROPIUM-ALBUTEROL 0.5-2.5 (3) MG/3ML IN SOLN
3.0000 mL | Freq: Two times a day (BID) | RESPIRATORY_TRACT | Status: DC
  Administered 2022-03-05 – 2022-03-06 (×2): 3 mL via RESPIRATORY_TRACT
  Filled 2022-03-05 (×2): qty 3

## 2022-03-05 MED ORDER — CARVEDILOL 6.25 MG PO TABS
6.2500 mg | ORAL_TABLET | Freq: Two times a day (BID) | ORAL | Status: DC
  Administered 2022-03-05 – 2022-03-06 (×2): 6.25 mg via ORAL
  Filled 2022-03-05 (×2): qty 1

## 2022-03-05 MED ORDER — HYDRALAZINE HCL 50 MG PO TABS
100.0000 mg | ORAL_TABLET | Freq: Three times a day (TID) | ORAL | Status: DC
  Administered 2022-03-05 – 2022-03-06 (×4): 100 mg via ORAL
  Filled 2022-03-05 (×4): qty 2

## 2022-03-05 MED ORDER — AMLODIPINE BESYLATE 10 MG PO TABS
10.0000 mg | ORAL_TABLET | Freq: Every day | ORAL | Status: DC
  Administered 2022-03-05 – 2022-03-06 (×2): 10 mg via ORAL
  Filled 2022-03-05 (×2): qty 1

## 2022-03-05 NOTE — Progress Notes (Signed)
Progress Note  Patient Name: Brad [name]. Date of Encounter: 03/05/2022  [facility] Cardiologist: Nelva Bush, MD   Subjective   Patient denies chest pain or shortness of breath.  Endorsed smoking and using cocaine.  UDS was positive for cocaine.  Blood pressure still elevated.  Inpatient Medications    Scheduled Meds:  amLODipine  10 mg Oral Daily   aspirin EC  81 mg Oral Daily   atorvastatin  80 mg Oral Daily   carvedilol  3.125 mg Oral BID WC   hydrALAZINE  100 mg Oral Q8H   ipratropium-albuterol  3 mL Nebulization BID   isosorbide mononitrate  30 mg Oral Daily   nicotine  21 mg Transdermal Daily   Continuous Infusions:  sodium chloride     sodium chloride     heparin 1,800 Units/hr (03/05/22 1120)   PRN Meds: docusate sodium, ondansetron (ZOFRAN) IV, polyethylene glycol   Vital Signs    Vitals:   03/05/22 0500 03/05/22 0624 03/05/22 0712 03/05/22 1136  BP:  (!) 157/90 (!) 195/143 (!) 188/113  Pulse:  62 60 72  Resp:   14 18  Temp:  98 F (36.7 C) 97.8 F (36.6 C) 97.8 F (36.6 C)  TempSrc:  Oral  Oral  SpO2:  98% 97% 98%  Weight: 125.7 kg     Height:        Intake/Output Summary (Last 24 hours) at 03/05/2022 1215 Last data filed at 03/05/2022 0640 Gross per 24 hour  Intake 844.18 ml  Output 1175 ml  Net -330.82 ml      03/05/2022    5:00 AM 03/04/2022    5:40 AM 03/04/2022    3:09 AM  Last 3 Weights  Weight (lbs) 277 lb 1.9 oz 274 lb 14.6 oz 267 lb  Weight (kg) 125.7 kg 124.7 kg 121.11 kg      Telemetry    Sinus rhythm- Personally Reviewed  ECG     - Personally Reviewed  Physical Exam   GEN: No acute distress.   Neck: No JVD Cardiac: RRR, no murmurs, rubs, or gallops.  Respiratory: Clear to auscultation bilaterally. GI: Soft, nontender, non-distended  MS: No edema; No deformity. Neuro:  Nonfocal  Psych: Normal affect   Labs    High Sensitivity Troponin:   Recent Labs  Lab 03/04/22 0309 03/04/22 0615  03/04/22 0817 03/04/22 1232 03/04/22 2248  TROPONINIHS 41* 475* 1,192* 2,103* 1,510*     Chemistry Recent Labs  Lab 03/04/22 0309 03/04/22 0615 03/05/22 0339  NA 141 141 143  K 4.3 3.4* 4.2  CL 107 109 110  CO2 19* 23 25  GLUCOSE 156* 146* 126*  BUN 23* 25* 30*  CREATININE 2.48* 2.42* 1.88*  CALCIUM 9.7 9.3 8.8*  MG 2.1 2.1 2.1  PROT 8.1  --   --   ALBUMIN 4.1  --   --   AST 24  --   --   ALT 12  --   --   ALKPHOS 56  --   --   BILITOT 1.4*  --   --   GFRNONAA 32* 33* 44*  ANIONGAP 15 9 8     Lipids No results for input(s): "CHOL", "TRIG", "HDL", "LABVLDL", "LDLCALC", "CHOLHDL" in the last 168 hours.  Hematology Recent Labs  Lab 03/04/22 0309 03/04/22 0615 03/05/22 0339  WBC 10.2 9.5 7.7  RBC 5.91* 5.29 5.29  HGB 15.1 13.7 13.5  HCT 47.8 42.9 43.1  MCV 80.9 81.1 81.5  MCH  25.5* 25.9* 25.5*  MCHC 31.6 31.9 31.3  RDW 16.0* 15.7* 15.8*  PLT 327 242 241   Thyroid  Recent Labs  Lab 03/04/22 0309  TSH 2.710    BNP Recent Labs  Lab 03/04/22 0309  BNP 713.0*    DDimer No results for input(s): "DDIMER" in the last 168 hours.   Radiology    DG Chest Port 1 View  Result Date: 03/05/2022 CLINICAL DATA:  46 year old male with history of congestive heart failure. EXAM: PORTABLE CHEST 1 VIEW COMPARISON:  Chest x-ray 03/04/2022. FINDINGS: Lung volumes are low. New opacity at the left base obscuring the left hemidiaphragm and blunting the left costophrenic sulcus. Right lung is clear. No pneumothorax. No evidence of pulmonary edema. Heart size is borderline enlarged, accentuated by low lung volumes and portable AP technique. Upper mediastinal contours are within normal limits. IMPRESSION: 1. New area of atelectasis and/or consolidation in the left lung base with probable small left pleural effusion. Electronically Signed   By: Trudie Reed M.D.   On: 03/05/2022 05:26   ECHOCARDIOGRAM COMPLETE  Result Date: 03/04/2022    ECHOCARDIOGRAM REPORT   Patient Name:    Brad [name]. Date of Exam: 03/04/2022 Medical Rec #:  [id number]            Height:       66.0 in Accession #:    [id number]           Weight:       274.9 lb Date of Birth:  27-Jun-1976            BSA:          2.289 m Patient Age:    46 years             BP:           161/82 mmHg Patient Gender: M                    HR:           78 bpm. Exam Location:  [facility] Procedure: 2D Echo, Cardiac Doppler and Color Doppler Indications:     Atrial Fibrillation I48.91  History:         Patient has prior history of Echocardiogram examinations, most                  recent 01/13/2021. CHF; Risk Factors:Hypertension. Cocaine                  abuse, tobacco use disorder.  Sonographer:     Cristela Blue Referring Phys:  [id number] MAGDALENE S TUKOV-YUAL Diagnosing Phys: Cristal Deer End MD  Sonographer Comments: Suboptimal apical window. IMPRESSIONS  1. Left ventricular ejection fraction, by estimation, is 60 to 65%. The left ventricle has normal function. The left ventricle has no regional wall motion abnormalities. There is severe left ventricular hypertrophy. Left ventricular diastolic parameters  are consistent with Grade III diastolic dysfunction (restrictive). Elevated left atrial pressure.  2. Right ventricular systolic function is normal. The right ventricular size is mildly enlarged. Moderately increased right ventricular wall thickness. Tricuspid regurgitation signal is inadequate for assessing PA pressure.  3. Left atrial size was moderately dilated.  4. Right atrial size was moderately dilated.  5. The mitral valve is normal in structure. Trivial mitral valve regurgitation. No evidence of mitral stenosis.  6. The aortic valve is tricuspid. Aortic valve regurgitation is not visualized. No aortic stenosis is present.  7. Aortic dilatation noted. There  is borderline dilatation of the aortic root, measuring 38 mm. There is borderline dilatation of the ascending aorta, measuring 36 mm. FINDINGS  Left Ventricle: Left ventricular  ejection fraction, by estimation, is 60 to 65%. The left ventricle has normal function. The left ventricle has no regional wall motion abnormalities. The left ventricular internal cavity size was normal in size. There is  severe left ventricular hypertrophy. Left ventricular diastolic parameters are consistent with Grade III diastolic dysfunction (restrictive). Elevated left atrial pressure. Right Ventricle: The right ventricular size is mildly enlarged. Moderately increased right ventricular wall thickness. Right ventricular systolic function is normal. Tricuspid regurgitation signal is inadequate for assessing PA pressure. Left Atrium: Left atrial size was moderately dilated. Right Atrium: Right atrial size was moderately dilated. Pericardium: There is no evidence of pericardial effusion. Mitral Valve: The mitral valve is normal in structure. Trivial mitral valve regurgitation. No evidence of mitral valve stenosis. Tricuspid Valve: The tricuspid valve is grossly normal. Tricuspid valve regurgitation is trivial. Aortic Valve: The aortic valve is tricuspid. Aortic valve regurgitation is not visualized. No aortic stenosis is present. Aortic valve mean gradient measures 3.0 mmHg. Aortic valve peak gradient measures 4.9 mmHg. Aortic valve area, by VTI measures 3.01 cm. Pulmonic Valve: The pulmonic valve was not well visualized. Pulmonic valve regurgitation is not visualized. No evidence of pulmonic stenosis. Aorta: Aortic dilatation noted. There is borderline dilatation of the aortic root, measuring 38 mm. There is borderline dilatation of the ascending aorta, measuring 36 mm. Pulmonary Artery: The pulmonary artery is not well seen. Venous: The inferior vena cava was not well visualized. IAS/Shunts: The interatrial septum was not well visualized.  LEFT VENTRICLE PLAX 2D LVIDd:         4.50 cm   Diastology LVIDs:         3.00 cm   LV e' medial:    3.59 cm/s LV PW:         2.10 cm   LV E/e' medial:  22.9 LV IVS:         2.20 cm   LV e' lateral:   5.11 cm/s LVOT diam:     2.10 cm   LV E/e' lateral: 16.1 LV SV:         56 LV SV Index:   24 LVOT Area:     3.46 cm  RIGHT VENTRICLE RV Basal diam:  4.60 cm RV S prime:     15.10 cm/s TAPSE (M-mode): 4.2 cm LEFT ATRIUM            Index        RIGHT ATRIUM           Index LA diam:      5.90 cm  2.58 cm/m   RA Area:     30.40 cm LA Vol (A2C): 212.0 ml 92.60 ml/m  RA Volume:   104.00 ml 45.43 ml/m LA Vol (A4C): 67.5 ml  29.48 ml/m  AORTIC VALVE AV Area (Vmax):    2.88 cm AV Area (Vmean):   2.86 cm AV Area (VTI):     3.01 cm AV Vmax:           110.50 cm/s AV Vmean:          77.400 cm/s AV VTI:            0.186 m AV Peak Grad:      4.9 mmHg AV Mean Grad:      3.0 mmHg LVOT Vmax:  92.00 cm/s LVOT Vmean:        63.800 cm/s LVOT VTI:          0.161 m LVOT/AV VTI ratio: 0.87  AORTA Ao Root diam: 3.80 cm MITRAL VALVE MV Area (PHT): 4.19 cm    SHUNTS MV Decel Time: 181 msec    Systemic VTI:  0.16 m MV E velocity: 82.30 cm/s  Systemic Diam: 2.10 cm MV A velocity: 32.60 cm/s MV E/A ratio:  2.52 Nelva Bush MD Electronically signed by Nelva Bush MD Signature Date/Time: 03/04/2022/3:43:46 PM    Final    DG Chest Port 1 View  Result Date: 03/04/2022 CLINICAL DATA:  acute dyspnea and chest pain EXAM: PORTABLE CHEST 1 VIEW COMPARISON:  04/10/2021 FINDINGS: Cardiomegaly, vascular congestion. No overt edema. No confluent opacities or effusions. No acute bony abnormality. IMPRESSION: Cardiomegaly, vascular congestion. Electronically Signed   By: Rolm Baptise M.D.   On: 03/04/2022 03:35    Cardiac Studies   TTE 03/04/2022 1. Left ventricular ejection fraction, by estimation, is 60 to 65%. The  left ventricle has normal function. The left ventricle has no regional  wall motion abnormalities. There is severe left ventricular hypertrophy.  Left ventricular diastolic parameters   are consistent with Grade III diastolic dysfunction (restrictive).  Elevated left atrial pressure.    2. Right ventricular systolic function is normal. The right ventricular  size is mildly enlarged. Moderately increased right ventricular wall  thickness. Tricuspid regurgitation signal is inadequate for assessing PA  pressure.   3. Left atrial size was moderately dilated.   4. Right atrial size was moderately dilated.   5. The mitral valve is normal in structure. Trivial mitral valve  regurgitation. No evidence of mitral stenosis.   6. The aortic valve is tricuspid. Aortic valve regurgitation is not  visualized. No aortic stenosis is present.   7. Aortic dilatation noted. There is borderline dilatation of the aortic  root, measuring 38 mm. There is borderline dilatation of the ascending  aorta, measuring 36 mm.   Patient Profile     46 y.o. male with history of HFpEF, hypertension, CKD 3, polysubstance abuse including cocaine and marijuana, obesity presenting with chest pain being seen for NSTEMI, hypertension, A-fib RVR.  Assessment & Plan    Chest pain, NSTEMI -Elevated BP, cocaine use likely contributing -Continue heparin x48 hours to complete tomorrow. -Get Lexiscan Myoview to rule out high risk ischemia due to ckd. -Currently denies chest pain, echo with normal EF no wall motion abnormalities  2.  A-fib RVR -S/p DC cardioversion -Maintaining sinus rhythm -Coreg, heparin -Start NOAC upon discharge  3.  Hypertension -BP still elevated -Restart PTA hydralazine 100 mg 3 times daily, increase amlodipine to 10 mg daily -Increase Coreg to 6.25 mg twice daily  4.  Polysubstance abuse, cocaine, tobacco -Complete cessation   Total encounter time more than 50 minutes  Greater than 50% was spent in counseling and coordination of care with the patient    Signed, Kate Sable, MD  03/05/2022, 12:15 PM

## 2022-03-05 NOTE — Progress Notes (Addendum)
  Progress Note   Patient: Brad [name]. GUR:[id number] DOB: [birth year] DOA: 03/04/2022     1 DOS: the patient was seen and examined on 03/05/2022   Brief hospital course: [name] is a 46 year old African-American male with a history of congestive heart failure, hypertension, CKD stage III, tobacco use disorder, and cocaine abuse who presented to the ED via EMS with complaints of sudden onset chest pain. Upon arriving the emergency room, patient was found to have rapid atrial fibrillation, patient was diaphoretic and hypotensive, cardioversion was performed.  Patient was admitted to the ICU for close monitoring.  His peak troponin was 2903.  EKG did not show ST elevation.  Patient also had acute renal failure with creatinine of 2.42.  He is treated with heparin and is seen by cardiology.   Assessment and Plan: Cardiogenic shock. New onset atrial fibrillation with RVR status post cardioversion. Elevated troponin secondary to cocaine abuse. Cocaine abuse. Patient developed hypotension at the time of admission, he also had atrial fibrillation with RVR.  After cardioversion, blood pressure is better. Patient is currently on anticoagulation with heparin drip for atrial fibrillation and elevated troponin. Elevated troponin most likely due to cocaine abuse, cannot rule out non-STEMI.  Work-up is scheduled per cardiology.  Acute on chronic diastolic congestive heart failure. Reviewed echocardiogram, ejection fraction 60 to 65% with severe LV hypertrophy, grade 3 diastolic dysfunction. Patient condition appears to be stabilized.  Acute kidney injury superimposed on chronic kidney disease stage IIIa. Reviewed prior lab, patient has stage IIIa chronic kidney disease, much worsening renal function at time of admission, but has been better since yesterday.  Continue to follow.  Morbid obesity. Diet and exercise advised  Cocaine abuse, marijuana use. Advised to quit.     Subjective:   Patient feels much better today, no short of breath.  No chest pain.  Physical Exam: Vitals:   03/05/22 0500 03/05/22 0624 03/05/22 0712 03/05/22 1136  BP:  (!) 157/90 (!) 195/143 (!) 188/113  Pulse:  62 60 72  Resp:   14 18  Temp:  98 F (36.7 C) 97.8 F (36.6 C) 97.8 F (36.6 C)  TempSrc:  Oral  Oral  SpO2:  98% 97% 98%  Weight: 125.7 kg     Height:       General exam: Appears calm and comfortable, morbid obese Respiratory system: Clear to auscultation. Respiratory effort normal. Cardiovascular system: S1 & S2 heard, RRR. No JVD, murmurs, rubs, gallops or clicks. No pedal edema. Gastrointestinal system: Abdomen is nondistended, soft and nontender. No organomegaly or masses felt. Normal bowel sounds heard. Central nervous system: Alert and oriented. No focal neurological deficits. Extremities: Symmetric 5 x 5 power. Skin: No rashes, lesions or ulcers Psychiatry: Judgement and insight appear normal. Mood & affect appropriate.   Data Reviewed:  Chest x-ray and lab results reviewed.  Family Communication: mother updated 1516  Disposition: Status is: Inpatient Remains inpatient appropriate because: Severity of disease, IV treatment, pending inpatient procedure  Planned Discharge Destination: Home    Time spent: 55 minutes  Author: Marrion Coy, MD 03/05/2022 12:21 PM  For on call review [email or web address].

## 2022-03-05 NOTE — Progress Notes (Signed)
ANTICOAGULATION CONSULT NOTE  Pharmacy Consult for heparin infusion initiation and monitoring  Indication: NEW ONSET AFIB S/P CARDIOVERSION  No Known Allergies  Patient Measurements: Height: 5\' 6"  (167.6 cm) Weight: 124.7 kg (274 lb 14.6 oz) IBW/kg (Calculated) : 63.8 Heparin Dosing Weight: 93.2  Vital Signs: Temp: 97.9 F (36.6 C) (07/11 2000) Temp Source: Oral (07/11 2000) BP: 173/84 (07/11 2213) Pulse Rate: 79 (07/11 2213)  Labs: Recent Labs    03/04/22 0309 03/04/22 0615 03/04/22 0817 03/04/22 1232 03/04/22 1453 03/04/22 2248  HGB 15.1 13.7  --   --   --   --   HCT 47.8 42.9  --   --   --   --   PLT 327 242  --   --   --   --   APTT  --   --  31  --   --   --   LABPROT 12.8  --   --   --   --   --   INR 1.0  --   --   --   --   --   HEPARINUNFRC  --   --   --   --  0.25* 0.22*  CREATININE 2.48* 2.42*  --   --   --   --   TROPONINIHS 41* 475* 1,192* 2,103*  --  1,510*     Estimated Creatinine Clearance: 47.6 mL/min (A) (by C-G formula based on SCr of 2.42 mg/dL (H)).   Medical History: Past Medical History:  Diagnosis Date   Asthma    CHF (congestive heart failure) (HCC)    CKD (chronic kidney disease), stage III (HCC)    Cocaine abuse (HCC)    Diverticulitis    Hypertension    Tobacco use disorder     Medications:  Scheduled:   aspirin EC  81 mg Oral Daily   atorvastatin  80 mg Oral Daily   carvedilol  3.125 mg Oral BID WC   Chlorhexidine Gluconate Cloth  6 each Topical Daily   ipratropium-albuterol  3 mL Nebulization Q6H   nicotine  21 mg Transdermal Daily    Assessment: 46 year old African-American male with a history of hypertension, congestive heart failure, CKD stage III and cocaine abuse who presented with new onset A-fib with RVR. A review of medical records reveals no chronic anticoagulation.  Goal of Therapy:  Heparin level 0.3-0.7 units/ml Monitor platelets by anticoagulation protocol: Yes   Plan:  Heparin level SUBtherapeutic:  Give 1400 units bolus x 1, then increase heparin infusion rate to 1800 units/hr reheck anti-Xa level in 6 hours after rate change Continue to monitor H&H and platelets  49, PharmD, [facility] 03/05/2022 12:28 AM

## 2022-03-05 NOTE — Progress Notes (Signed)
PHARMACY CONSULT NOTE  Pharmacy Consult for Electrolyte Monitoring and Replacement   Recent Labs: Potassium (mmol/L)  Date Value  03/05/2022 4.2   Magnesium (mg/dL)  Date Value  38/88/2800 2.1   Calcium (mg/dL)  Date Value  34/91/7915 8.8 (L)   Albumin (g/dL)  Date Value  05/69/7948 4.1  01/31/2022 4.6   Phosphorus (mg/dL)  Date Value  01/65/5374 3.6   Sodium (mmol/L)  Date Value  03/05/2022 143  05/10/2021 142    Assessment: 46 year old African-American male with a history of hypertension, congestive heart failure, CKD stage III and cocaine abuse who presented with new onset A-fib with RVR  Goal of Therapy:  Potassium 4.0 - 5.1 mmol/L Magnesium 2.0 - 2.4 mg/dL All Other Electrolytes WNL  Plan:  No electrolyte replacement warranted for today Pharmacy will sign off and monitor electrolytes peripherally. Please reconsult pharmacy is continuous repletion/monitoring is needed.  Thank you for allowing pharmacy to be a part of this patient's care.  Selinda Eon Clinical Pharmacist 03/05/2022 10:04 AM

## 2022-03-05 NOTE — Evaluation (Signed)
Physical Therapy Evaluation Patient Details Name: Brad [name]. MRN: [id number] DOB: 02/07/1976 Today's Date: 03/05/2022  History of Present Illness  Brad [name] is a 46 year old African-American male with a history of congestive heart failure, hypertension, CKD stage III, tobacco use disorder, and cocaine abuse who presented to the ED via EMS with complaints of sudden onset chest pain.  Clinical Impression  Pt received in supine position and agreeable to therapy.  Pt performed all mobility and transfers independently without any assistance needed.  Pt does have some forward flexion that he notes is from his back pain that he is currently seeing a chiropractor for.  Otherwise, pt has no complaints and feels as though he is at his baseline levels.  Patient is at baseline, all education completed, and time is given to address all questions/concerns. No additional skilled PT services needed at this time, PT signing off. PT recommends daily ambulation ad lib or with nursing staff as needed to prevent deconditioning.         Recommendations for follow up therapy are one component of a multi-disciplinary discharge planning process, led by the attending physician.  Recommendations may be updated based on patient status, additional functional criteria and insurance authorization.  Follow Up Recommendations No PT follow up      Assistance Recommended at Discharge None  Patient can return home with the following       Equipment Recommendations None recommended by PT  Recommendations for Other Services       Functional Status Assessment Patient has had a recent decline in their functional status and demonstrates the ability to make significant improvements in function in a reasonable and predictable amount of time.     Precautions / Restrictions Precautions Precautions: None Restrictions Weight Bearing Restrictions: No      Mobility  Bed Mobility Overal bed mobility: Independent                   Transfers Overall transfer level: Independent                      Ambulation/Gait Ambulation/Gait assistance: Independent Gait Distance (Feet): 160 Feet Assistive device: None Gait Pattern/deviations: WFL(Within Functional Limits), Trunk flexed Gait velocity: WFL     General Gait Details: pt independent with gait, noting slight forward flexion due to pain in back.  Stairs            Wheelchair Mobility    Modified Rankin (Stroke Patients Only)       Balance Overall balance assessment: No apparent balance deficits (not formally assessed)                                           Pertinent Vitals/Pain Pain Assessment Pain Assessment: No/denies pain    Home Living Family/patient expects to be discharged to:: Private residence Living Arrangements: Parent   Type of Home: Apartment Home Access: Level entry       Home Layout: One level Home Equipment: Agricultural consultant (2 wheels);Hand held shower head      Prior Function Prior Level of Function : Independent/Modified Independent                     Hand Dominance   Dominant Hand: Right    Extremity/Trunk Assessment   Upper Extremity Assessment Upper Extremity Assessment: Overall WFL for tasks assessed  Lower Extremity Assessment Lower Extremity Assessment: Overall WFL for tasks assessed       Communication   Communication: No difficulties  Cognition Arousal/Alertness: Awake/alert Behavior During Therapy: WFL for tasks assessed/performed Overall Cognitive Status: Within Functional Limits for tasks assessed                                          General Comments      Exercises     Assessment/Plan    PT Assessment Patient does not need any further PT services  PT Problem List         PT Treatment Interventions      PT Goals (Current goals can be found in the Care Plan section)  Acute Rehab PT Goals Patient  Stated Goal: to go home. PT Goal Formulation: With patient Time For Goal Achievement: 03/19/22 Potential to Achieve Goals: Good    Frequency       Co-evaluation               AM-PAC PT "6 Clicks" Mobility  Outcome Measure Help needed turning from your back to your side while in a flat bed without using bedrails?: None Help needed moving from lying on your back to sitting on the side of a flat bed without using bedrails?: None Help needed moving to and from a bed to a chair (including a wheelchair)?: None Help needed standing up from a chair using your arms (e.g., wheelchair or bedside chair)?: None Help needed to walk in hospital room?: None Help needed climbing 3-5 steps with a railing? : None 6 Click Score: 24    End of Session   Activity Tolerance: Patient tolerated treatment well Patient left: in bed;with call bell/phone within reach Nurse Communication: Mobility status PT Visit Diagnosis: Unsteadiness on feet (R26.81)    Time: 4665-9935 PT Time Calculation (min) (ACUTE ONLY): 13 min   Charges:   PT Evaluation $PT Eval Low Complexity: 1 Low          Nolon Bussing, PT, DPT 03/05/22, 2:00 PM   Phineas Real 03/05/2022, 1:58 PM

## 2022-03-05 NOTE — Progress Notes (Signed)
ANTICOAGULATION CONSULT NOTE  Pharmacy Consult for heparin infusion initiation and monitoring  Indication: NEW ONSET AFIB S/P CARDIOVERSION  No Known Allergies  Patient Measurements: Height: 5\' 6"  (167.6 cm) Weight: 125.7 kg (277 lb 1.9 oz) IBW/kg (Calculated) : 63.8 Heparin Dosing Weight: 93.2  Vital Signs: Temp: 97.8 F (36.6 C) (07/12 0712) Temp Source: Oral (07/12 0624) BP: 195/143 (07/12 0712) Pulse Rate: 60 (07/12 0712)  Labs: Recent Labs    03/04/22 0309 03/04/22 0615 03/04/22 0817 03/04/22 1232 03/04/22 1453 03/04/22 2248 03/05/22 0339 03/05/22 0648  HGB 15.1 13.7  --   --   --   --  13.5  --   HCT 47.8 42.9  --   --   --   --  43.1  --   PLT 327 242  --   --   --   --  241  --   APTT  --   --  31  --   --   --   --   --   LABPROT 12.8  --   --   --   --   --   --   --   INR 1.0  --   --   --   --   --   --   --   HEPARINUNFRC  --   --   --   --  0.25* 0.22*  --  0.34  CREATININE 2.48* 2.42*  --   --   --   --  1.88*  --   TROPONINIHS 41* 475* 1,192* 2,103*  --  1,510*  --   --      Estimated Creatinine Clearance: 61.5 mL/min (A) (by C-G formula based on SCr of 1.88 mg/dL (H)).   Medical History: Past Medical History:  Diagnosis Date   Asthma    CHF (congestive heart failure) (HCC)    CKD (chronic kidney disease), stage III (HCC)    Cocaine abuse (HCC)    Diverticulitis    Hypertension    Tobacco use disorder     Medications:  Scheduled:   aspirin EC  81 mg Oral Daily   atorvastatin  80 mg Oral Daily   carvedilol  3.125 mg Oral BID WC   ipratropium-albuterol  3 mL Nebulization BID   nicotine  21 mg Transdermal Daily    Assessment: 46 year old African-American male with a history of hypertension, congestive heart failure, CKD stage III and cocaine abuse who presented with new onset A-fib with RVR. A review of medical records reveals no chronic anticoagulation.  Goal of Therapy:  Heparin level 0.3-0.7 units/ml Monitor platelets by  anticoagulation protocol: Yes  Date Time HL Rate/Comment 7/12 0648 0.34 1800u/hr / Therapeutic x1  Plan:  HL therapeutic x1 Continue heparin infusion rate to 1800 units/hr Reheck anti-Xa level in 6 hours Continue to monitor H&H and platelets  Thank you for allowing pharmacy to be a part of this patient's care.  9/12 Clinical Pharmacist 03/05/2022 7:53 AM

## 2022-03-05 NOTE — Progress Notes (Signed)
ANTICOAGULATION CONSULT NOTE  Pharmacy Consult for heparin infusion initiation and monitoring  Indication: New onset afib s/p cardioversion  No Known Allergies  Patient Measurements: Height: 5\' 6"  (167.6 cm) Weight: 125.7 kg (277 lb 1.9 oz) IBW/kg (Calculated) : 63.8 Heparin Dosing Weight: 93.2  Vital Signs: Temp: 97.8 F (36.6 C) (07/12 1136) Temp Source: Oral (07/12 1136) BP: 188/113 (07/12 1136) Pulse Rate: 72 (07/12 1136)  Labs: Recent Labs    03/04/22 0309 03/04/22 0615 03/04/22 0817 03/04/22 1232 03/04/22 1453 03/04/22 2248 03/05/22 0339 03/05/22 0648 03/05/22 1305  HGB 15.1 13.7  --   --   --   --  13.5  --   --   HCT 47.8 42.9  --   --   --   --  43.1  --   --   PLT 327 242  --   --   --   --  241  --   --   APTT  --   --  31  --   --   --   --   --   --   LABPROT 12.8  --   --   --   --   --   --   --   --   INR 1.0  --   --   --   --   --   --   --   --   HEPARINUNFRC  --   --   --   --    < > 0.22*  --  0.34 0.34  CREATININE 2.48* 2.42*  --   --   --   --  1.88*  --   --   TROPONINIHS 41* 475* 1,192* 2,103*  --  1,510*  --   --   --    < > = values in this interval not displayed.     Estimated Creatinine Clearance: 61.5 mL/min (A) (by C-G formula based on SCr of 1.88 mg/dL (H)).   Medical History: Past Medical History:  Diagnosis Date   Asthma    CHF (congestive heart failure) (HCC)    CKD (chronic kidney disease), stage III (HCC)    Cocaine abuse (HCC)    Diverticulitis    Hypertension    Tobacco use disorder     Medications:  Scheduled:   amLODipine  10 mg Oral Daily   aspirin EC  81 mg Oral Daily   atorvastatin  80 mg Oral Daily   carvedilol  6.25 mg Oral BID WC   hydrALAZINE  100 mg Oral Q8H   ipratropium-albuterol  3 mL Nebulization BID   isosorbide mononitrate  30 mg Oral Daily   nicotine  21 mg Transdermal Daily    Assessment: 46 year old African-American male with a history of hypertension, congestive heart failure, CKD stage  III and cocaine abuse who presented with new onset A-fib with RVR. A review of medical records reveals no chronic anticoagulation.  Goal of Therapy:  Heparin level 0.3-0.7 units/ml Monitor platelets by anticoagulation protocol: Yes  Date Time HL Rate/Comment 7/12 0648 0.34 1800u/hr / Therapeutic x1 7/12 1305 0.34 1800u/hr / Therapeutic x2  Plan:  HL therapeutic x2 Continue heparin infusion rate to 1800 units/hr Reheck anti-Xa level daily with AM labs Continue to monitor H&H and platelets  Thank you for allowing pharmacy to be a part of this patient's care.  9/12 Clinical Pharmacist 03/05/2022 1:41 PM

## 2022-03-06 ENCOUNTER — Other Ambulatory Visit: Payer: Self-pay

## 2022-03-06 ENCOUNTER — Telehealth (HOSPITAL_COMMUNITY): Payer: Self-pay | Admitting: Pharmacy Technician

## 2022-03-06 ENCOUNTER — Other Ambulatory Visit (HOSPITAL_COMMUNITY): Payer: Self-pay

## 2022-03-06 ENCOUNTER — Inpatient Hospital Stay (HOSPITAL_COMMUNITY): Payer: Self-pay

## 2022-03-06 DIAGNOSIS — E1169 Type 2 diabetes mellitus with other specified complication: Secondary | ICD-10-CM

## 2022-03-06 DIAGNOSIS — I248 Other forms of acute ischemic heart disease: Secondary | ICD-10-CM

## 2022-03-06 DIAGNOSIS — I5033 Acute on chronic diastolic (congestive) heart failure: Secondary | ICD-10-CM

## 2022-03-06 DIAGNOSIS — N170 Acute kidney failure with tubular necrosis: Secondary | ICD-10-CM

## 2022-03-06 DIAGNOSIS — N1831 Chronic kidney disease, stage 3a: Secondary | ICD-10-CM

## 2022-03-06 DIAGNOSIS — R079 Chest pain, unspecified: Secondary | ICD-10-CM

## 2022-03-06 DIAGNOSIS — F1410 Cocaine abuse, uncomplicated: Secondary | ICD-10-CM

## 2022-03-06 DIAGNOSIS — I214 Non-ST elevation (NSTEMI) myocardial infarction: Secondary | ICD-10-CM

## 2022-03-06 LAB — NM MYOCAR MULTI W/SPECT W/WALL MOTION / EF
LV dias vol: 185 mL (ref 62–150)
LV sys vol: 95 mL
Nuc Stress EF: 49 %
Peak HR: 97 {beats}/min
Percent HR: 55 %
Rest HR: 60 {beats}/min
Rest Nuclear Isotope Dose: 10.6 mCi
SDS: 1
SRS: 1
SSS: 2
Stress Nuclear Isotope Dose: 31.6 mCi
TID: 1.13

## 2022-03-06 LAB — BASIC METABOLIC PANEL
Anion gap: 9 (ref 5–15)
BUN: 28 mg/dL — ABNORMAL HIGH (ref 6–20)
CO2: 24 mmol/L (ref 22–32)
Calcium: 9.1 mg/dL (ref 8.9–10.3)
Chloride: 106 mmol/L (ref 98–111)
Creatinine, Ser: 1.63 mg/dL — ABNORMAL HIGH (ref 0.61–1.24)
GFR, Estimated: 52 mL/min — ABNORMAL LOW (ref >60)
Glucose, Bld: 104 mg/dL — ABNORMAL HIGH (ref 70–99)
Potassium: 4 mmol/L (ref 3.5–5.1)
Sodium: 139 mmol/L (ref 135–145)

## 2022-03-06 LAB — CBC
HCT: 45.1 % (ref 39.0–52.0)
Hemoglobin: 14.4 g/dL (ref 13.0–17.0)
MCH: 25.9 pg — ABNORMAL LOW (ref 26.0–34.0)
MCHC: 31.9 g/dL (ref 30.0–36.0)
MCV: 81 fL (ref 80.0–100.0)
Platelets: 279 10*3/uL (ref 150–400)
RBC: 5.57 MIL/uL (ref 4.22–5.81)
RDW: 15.6 % — ABNORMAL HIGH (ref 11.5–15.5)
WBC: 7.5 10*3/uL (ref 4.0–10.5)
nRBC: 0 % (ref 0.0–0.2)

## 2022-03-06 LAB — HEPARIN LEVEL (UNFRACTIONATED): Heparin Unfractionated: 0.36 IU/mL (ref 0.30–0.70)

## 2022-03-06 LAB — MAGNESIUM: Magnesium: 2.2 mg/dL (ref 1.7–2.4)

## 2022-03-06 MED ORDER — REGADENOSON 0.4 MG/5ML IV SOLN
0.4000 mg | Freq: Once | INTRAVENOUS | Status: AC
  Administered 2022-03-06: 0.4 mg via INTRAVENOUS

## 2022-03-06 MED ORDER — APIXABAN 5 MG PO TABS
5.0000 mg | ORAL_TABLET | Freq: Two times a day (BID) | ORAL | Status: DC
  Administered 2022-03-06: 5 mg via ORAL
  Filled 2022-03-06: qty 1

## 2022-03-06 MED ORDER — CARVEDILOL 6.25 MG PO TABS
6.2500 mg | ORAL_TABLET | Freq: Two times a day (BID) | ORAL | 0 refills | Status: DC
  Filled 2022-03-06: qty 60, 30d supply, fill #0

## 2022-03-06 MED ORDER — ATORVASTATIN CALCIUM 80 MG PO TABS
80.0000 mg | ORAL_TABLET | Freq: Every day | ORAL | 0 refills | Status: DC
  Filled 2022-03-06: qty 30, 30d supply, fill #0

## 2022-03-06 MED ORDER — TECHNETIUM TC 99M TETROFOSMIN IV KIT
31.6100 | PACK | Freq: Once | INTRAVENOUS | Status: AC | PRN
  Administered 2022-03-06: 31.61 via INTRAVENOUS

## 2022-03-06 MED ORDER — APIXABAN 5 MG PO TABS
5.0000 mg | ORAL_TABLET | Freq: Two times a day (BID) | ORAL | 0 refills | Status: DC
  Filled 2022-03-06: qty 60, 30d supply, fill #0

## 2022-03-06 MED ORDER — TECHNETIUM TC 99M TETROFOSMIN IV KIT
10.0000 | PACK | Freq: Once | INTRAVENOUS | Status: AC | PRN
  Administered 2022-03-06: 10.6 via INTRAVENOUS

## 2022-03-06 NOTE — Progress Notes (Signed)
ANTICOAGULATION CONSULT NOTE  Pharmacy Consult for heparin infusion initiation and monitoring  Indication: New onset afib s/p cardioversion  No Known Allergies  Patient Measurements: Height: 5\' 6"  (167.6 cm) Weight: 125.7 kg (277 lb 1.9 oz) IBW/kg (Calculated) : 63.8 Heparin Dosing Weight: 93.2  Vital Signs: Temp: 97.6 F (36.4 C) (07/13 0006) BP: 168/104 (07/13 0737) Pulse Rate: 72 (07/13 0737)  Labs: Recent Labs    03/04/22 0309 03/04/22 0615 03/04/22 0817 03/04/22 1232 03/04/22 1453 03/04/22 2248 03/05/22 0339 03/05/22 0648 03/05/22 1305 03/06/22 0429 03/06/22 0647  HGB 15.1 13.7  --   --   --   --  13.5  --   --  14.4  --   HCT 47.8 42.9  --   --   --   --  43.1  --   --  45.1  --   PLT 327 242  --   --   --   --  241  --   --  279  --   APTT  --   --  31  --   --   --   --   --   --   --   --   LABPROT 12.8  --   --   --   --   --   --   --   --   --   --   INR 1.0  --   --   --   --   --   --   --   --   --   --   HEPARINUNFRC  --   --   --   --    < > 0.22*  --  0.34 0.34  --  0.36  CREATININE 2.48* 2.42*  --   --   --   --  1.88*  --   --  1.63*  --   TROPONINIHS 41* 475* 1,192* 2,103*  --  1,510*  --   --   --   --   --    < > = values in this interval not displayed.     Estimated Creatinine Clearance: 71 mL/min (A) (by C-G formula based on SCr of 1.63 mg/dL (H)).   Medical History: Past Medical History:  Diagnosis Date   Asthma    CHF (congestive heart failure) (HCC)    CKD (chronic kidney disease), stage III (HCC)    Cocaine abuse (HCC)    Diverticulitis    Hypertension    Tobacco use disorder     Medications:  PTA: ASA 81  Inpatient: Heparin 5000u sq x1 > Heparin infusion (7/11 >) Allergies: NKDA   Assessment: 46 year old African-American male with a history of hypertension, congestive heart failure, CKD stage III and cocaine abuse who presented with new onset A-fib with RVR. A review of medical records reveals no chronic  anticoagulation.  Goal of Therapy:  Heparin level 0.3-0.7 units/ml Monitor platelets by anticoagulation protocol: Yes  Date Time HL Rate/Comment 7/12 0648 0.34 1800u/hr / Therapeutic x1 7/12 1305 0.34 1800u/hr / Therapeutic x2 7/13 0647 0.36 1800u/hr / Therapeutic x3  Plan:  HL therapeutic x3 Continue heparin infusion rate to 1800 units/hr Reheck anti-Xa level daily with AM labs Continue to monitor H&H and platelets  Thank you for allowing pharmacy to be a part of this patient's care.  8/13 Clinical Pharmacist 03/06/2022 7:50 AM

## 2022-03-06 NOTE — Telephone Encounter (Signed)
Pharmacy Patient Advocate Encounter  Insurance verification completed.    The patient is not insured  The patient is currently admitted and ran test claims for the following: Eliquis .  Copays and coinsurance results were relayed to Inpatient clinical team.

## 2022-03-06 NOTE — Progress Notes (Signed)
Progress Note  Patient Name: Brad [name]. Date of Encounter: 03/06/2022  [facility] Cardiologist: Yvonne Kendall, MD   Subjective   Patient seen on a.m. rounds.  Denies chest pain, shortness of breath, and dizziness.  Is scheduled for stress testing today.  Inpatient Medications    Scheduled Meds:  amLODipine  10 mg Oral Daily   aspirin EC  81 mg Oral Daily   atorvastatin  80 mg Oral Daily   carvedilol  6.25 mg Oral BID WC   hydrALAZINE  100 mg Oral Q8H   ipratropium-albuterol  3 mL Nebulization BID   isosorbide mononitrate  30 mg Oral Daily   nicotine  21 mg Transdermal Daily   Continuous Infusions:  sodium chloride     sodium chloride     heparin 1,800 Units/hr (03/06/22 0306)   PRN Meds: docusate sodium, ondansetron (ZOFRAN) IV, polyethylene glycol   Vital Signs    Vitals:   03/06/22 0006 03/06/22 0340 03/06/22 0737 03/06/22 0909  BP: 123/67 (!) 156/81 (!) 168/104   Pulse: 88 63 72   Resp: 20 17 17    Temp: 97.6 F (36.4 C)     TempSrc:      SpO2: 93% 92% 98% 98%  Weight:      Height:        Intake/Output Summary (Last 24 hours) at 03/06/2022 1124 Last data filed at 03/06/2022 0306 Gross per 24 hour  Intake 363.7 ml  Output 2025 ml  Net -1661.3 ml      03/05/2022    5:00 AM 03/04/2022    5:40 AM 03/04/2022    3:09 AM  Last 3 Weights  Weight (lbs) 277 lb 1.9 oz 274 lb 14.6 oz 267 lb  Weight (kg) 125.7 kg 124.7 kg 121.11 kg      Telemetry    Sinus rhythm rate in the 70s with occasional unifocal PVCs- Personally Reviewed  ECG    No new tracings- Personally Reviewed  Physical Exam   GEN: No acute distress.   Neck: No JVD Cardiac: RRR, no murmurs, rubs, or gallops.  Respiratory: Clear to auscultation bilaterally. GI: Soft, nontender, non-distended  MS: No edema; No deformity. Neuro:  Nonfocal  Psych: Normal affect   Labs    High Sensitivity Troponin:   Recent Labs  Lab 03/04/22 0309 03/04/22 0615 03/04/22 0817  03/04/22 1232 03/04/22 2248  TROPONINIHS 41* 475* 1,192* 2,103* 1,510*     Chemistry Recent Labs  Lab 03/04/22 0309 03/04/22 0615 03/05/22 0339 03/06/22 0429  NA 141 141 143 139  K 4.3 3.4* 4.2 4.0  CL 107 109 110 106  CO2 19* 23 25 24   GLUCOSE 156* 146* 126* 104*  BUN 23* 25* 30* 28*  CREATININE 2.48* 2.42* 1.88* 1.63*  CALCIUM 9.7 9.3 8.8* 9.1  MG 2.1 2.1 2.1 2.2  PROT 8.1  --   --   --   ALBUMIN 4.1  --   --   --   AST 24  --   --   --   ALT 12  --   --   --   ALKPHOS 56  --   --   --   BILITOT 1.4*  --   --   --   GFRNONAA 32* 33* 44* 52*  ANIONGAP 15 9 8 9     Lipids No results for input(s): "CHOL", "TRIG", "HDL", "LABVLDL", "LDLCALC", "CHOLHDL" in the last 168 hours.  Hematology Recent Labs  Lab 03/04/22 0615 03/05/22 03/06/22  0429  WBC 9.5 7.7 7.5  RBC 5.29 5.29 5.57  HGB 13.7 13.5 14.4  HCT 42.9 43.1 45.1  MCV 81.1 81.5 81.0  MCH 25.9* 25.5* 25.9*  MCHC 31.9 31.3 31.9  RDW 15.7* 15.8* 15.6*  PLT 242 241 279   Thyroid  Recent Labs  Lab 03/04/22 0309  TSH 2.710    BNP Recent Labs  Lab 03/04/22 0309  BNP 713.0*    DDimer No results for input(s): "DDIMER" in the last 168 hours.   Radiology     Cardiac Studies   TTE 03/04/2022 1. Left ventricular ejection fraction, by estimation, is 60 to 65%. The  left ventricle has normal function. The left ventricle has no regional  wall motion abnormalities. There is severe left ventricular hypertrophy.  Left ventricular diastolic parameters   are consistent with Grade III diastolic dysfunction (restrictive).  Elevated left atrial pressure.   2. Right ventricular systolic function is normal. The right ventricular  size is mildly enlarged. Moderately increased right ventricular wall  thickness. Tricuspid regurgitation signal is inadequate for assessing PA  pressure.   3. Left atrial size was moderately dilated.   4. Right atrial size was moderately dilated.   5. The mitral valve is normal in  structure. Trivial mitral valve  regurgitation. No evidence of mitral stenosis.   6. The aortic valve is tricuspid. Aortic valve regurgitation is not  visualized. No aortic stenosis is present.   7. Aortic dilatation noted. There is borderline dilatation of the aortic  root, measuring 38 mm. There is borderline dilatation of the ascending  aorta, measuring 36 mm.   Patient Profile     46 y.o. male with a history of HFpEF, hypertension, CKD 3, polysubstance abuse including cocaine and marijuana, obesity, who presented with chest pain being seen for NSTEMI, hypotension, and new onset atrial fibrillation RVR  Assessment & Plan    NSTEMI -Likely secondary to elevated BP and recent cocaine use likely contributing factor -Lexiscan stress testing scheduled for today to rule out high risk ischemia -Currently remains pain-free -Has continued heparin drip until stress testing is completed to ensure no further diagnostic/invasive procedures are needed -EKG as needed  Atrial fibrillation RVR -Status post DC cardioversion -Currently sinus rhythm -Continue heparin drip until after Lexiscan -Continue carvedilol -Start NOAC upon discharge for CHA2DS2-VASc score 2  Hypertension -Blood pressure 168/104 this morning -Continue amlodipine 10 mg daily, continue carvedilol 6.25 mg twice daily, continue hydralazine 100 mg every 8 hours, continue Imdur 30 mg daily -Vital signs per unit protocol  Polysubstance abuse, cocaine, tobacco, marijuana -Cessation recommended      For questions or updates, please contact [facility] Please consult [email or web address] for contact info under        Signed, Lautaro Koral, NP  03/06/2022, 11:24 AM

## 2022-03-06 NOTE — Discharge Summary (Addendum)
Physician Discharge Summary   Patient: Brad [name]. MRN: [id number] DOB: [birth year]  Admit date:     03/04/2022  Discharge date: 03/06/22  Discharge Physician: Sharen Hones   PCP: Ladell Pier, MD   Recommendations at discharge:   Follow-up with PCP and cardiology as scheduled.  Discharge Diagnoses: Principal Problem:   Cardiogenic shock ([place]) Active Problems:   Cocaine abuse (HCC)   Elevated troponin   Acute renal failure superimposed on stage 3a chronic kidney disease (HCC)   Acute heart failure (HCC)   Essential hypertension   Acute on chronic diastolic CHF (congestive heart failure) (HCC)   Type 2 diabetes mellitus with morbid obesity ([place])   Marijuana user   Obesity, Class III, BMI 40-49.9 (morbid obesity) ([place])   New onset atrial fibrillation (HCC)   NSTEMI (non-ST elevated myocardial infarction) ([place])  Resolved Problems:   * No resolved hospital problems. [facility] Course: Brad [name] is a 46 year old African-American male with a history of congestive heart failure, hypertension, CKD stage III, tobacco use disorder, and cocaine abuse who presented to the ED via EMS with complaints of sudden onset chest pain. Upon arriving the emergency room, patient was found to have rapid atrial fibrillation, patient was diaphoretic and hypotensive, cardioversion was performed.  Patient was admitted to the ICU for close monitoring.  His peak troponin was 2903.  EKG did not show ST elevation.  Patient also had acute renal failure with creatinine of 2.42.  He is treated with heparin and is seen by cardiology. Patient had a nuclear stress test performed today, no ischemic changes.  Condition caused by cocaine abuse.  Medically stable to be discharged.  Assessment and Plan: Cardiogenic shock. New onset atrial fibrillation with RVR status post cardioversion. NSTEMI secondary to cocaine abuse. Ruled in, POA Cocaine abuse. Patient developed hypotension at the time of  admission, he also had atrial fibrillation with RVR.  After cardioversion, blood pressure is better. Patient is treated with heparin drip for atrial fibrillation and elevated troponin. Nuclear stress test came back negative, patient had a non-STEMI, but caused by cocaine abuse. At this point, condition has improved.  Patient be treated with Eliquis per recommendation from cardiology.  Patient be followed up with cardiology as outpatient.    Acute on chronic diastolic congestive heart failure. Reviewed echocardiogram, ejection fraction 60 to 65% with severe LV hypertrophy, grade 3 diastolic dysfunction. Patient condition appears to be stabilized.   Acute kidney injury superimposed on chronic kidney disease stage IIIa. Reviewed prior lab, patient has stage IIIa chronic kidney disease, much worsening renal function at time of admission, but has been better.   Morbid obesity. Diet and exercise advised  Cocaine abuse, marijuana use. Advised to quit.        Consultants: Cardiology Procedures performed: None  Disposition: Home Diet recommendation:  Discharge Diet Orders (From admission, onward)     Start     Ordered   03/06/22 0000  Diet - low sodium heart healthy        03/06/22 1353           Cardiac diet DISCHARGE MEDICATION: Allergies as of 03/06/2022   No Known Allergies      Medication List     STOP taking these medications    metoprolol succinate 25 MG 24 hr tablet Commonly known as: TOPROL-XL   rosuvastatin 20 MG tablet Commonly known as: CRESTOR       TAKE these medications    amLODipine 10 MG  tablet Commonly known as: NORVASC Take 1 tablet (10 mg total) by mouth once daily.   apixaban 5 MG Tabs tablet Commonly known as: ELIQUIS Take 1 tablet (5 mg total) by mouth 2 (two) times daily.   aspirin EC 81 MG tablet Take 1 tablet (81 mg total) by mouth daily. Swallow whole.   atorvastatin 80 MG tablet Commonly known as: LIPITOR Take 1 tablet (80  mg total) by mouth daily. Start taking on: March 07, 2022   carvedilol 6.25 MG tablet Commonly known as: COREG Take 1 tablet (6.25 mg total) by mouth 2 (two) times daily with a meal.   Entresto 97-103 MG Generic drug: sacubitril-valsartan Take 1 tablet by mouth 2 (two) times daily.   furosemide 40 MG tablet Commonly known as: Lasix Take 1 tablet (40 mg total) by mouth once daily.   hydrALAZINE 100 MG tablet Commonly known as: APRESOLINE Take 1 tablet (100 mg total) by mouth 3 (three) times daily.   isosorbide mononitrate 30 MG 24 hr tablet Commonly known as: IMDUR Take 1 tablet (30 mg total) by mouth once daily.        Follow-up Information     Marcine Matar, MD Follow up in 1 week(s).   Specialty: Internal Medicine Contact information: [address] [place] [address] [phone]         Yvonne Kendall, MD Follow up in 2 week(s).   Specialty: Cardiology Contact information: [address] [place] [address] [phone]                Discharge Exam: Ceasar Mons Weights   03/04/22 0309 03/04/22 0540 03/05/22 0500  Weight: 121.1 kg 124.7 kg 125.7 kg   General exam: Appears calm and comfortable  Respiratory system: Clear to auscultation. Respiratory effort normal. Cardiovascular system: S1 & S2 heard, RRR. No JVD, murmurs, rubs, gallops or clicks. No pedal edema. Gastrointestinal system: Abdomen is nondistended, soft and nontender. No organomegaly or masses felt. Normal bowel sounds heard. Central nervous system: Alert and oriented. No focal neurological deficits. Extremities: Symmetric 5 x 5 power. Skin: No rashes, lesions or ulcers Psychiatry: Judgement and insight appear normal. Mood & affect appropriate.    Condition at discharge: good  The results of significant diagnostics from this hospitalization (including imaging, microbiology, ancillary and laboratory) are listed below for reference.   Imaging  Studies: NM Myocar Multi W/Spect W/Wall Motion / EF  Result Date: 03/06/2022 Pharmacological myocardial perfusion imaging study with no significant  ischemia Normal wall motion, EF estimated at 49% No EKG changes concerning for ischemia at peak stress or in recovery. CT attenuation correction images with no significant coronary calcification or aortic atherosclerosis Low risk scan Signed, Dossie Arbour, MD, Ph.D [facility]   DG Chest [place] 1 View  Result Date: 03/05/2022 CLINICAL DATA:  46 year old male with history of congestive heart failure. EXAM: PORTABLE CHEST 1 VIEW COMPARISON:  Chest x-ray 03/04/2022. FINDINGS: Lung volumes are low. New opacity at the left base obscuring the left hemidiaphragm and blunting the left costophrenic sulcus. Right lung is clear. No pneumothorax. No evidence of pulmonary edema. Heart size is borderline enlarged, accentuated by low lung volumes and portable AP technique. Upper mediastinal contours are within normal limits. IMPRESSION: 1. New area of atelectasis and/or consolidation in the left lung base with probable small left pleural effusion. Electronically Signed   By: Trudie Reed M.D.   On: 03/05/2022 05:26   ECHOCARDIOGRAM COMPLETE  Result Date: 03/04/2022  ECHOCARDIOGRAM REPORT   Patient Name:   Brad [name]. Date of Exam: 03/04/2022 Medical Rec #:  [id number]            Height:       66.0 in Accession #:    [id number]           Weight:       274.9 lb Date of Birth:  [birth year]            BSA:          2.289 m Patient Age:    28 years             BP:           161/82 mmHg Patient Gender: M                    HR:           78 bpm. Exam Location:  [facility] Procedure: 2D Echo, Cardiac Doppler and Color Doppler Indications:     Atrial Fibrillation I48.91  History:         Patient has prior history of Echocardiogram examinations, most                  recent 01/13/2021. CHF; Risk Factors:Hypertension. Cocaine                  abuse, tobacco use disorder.   Sonographer:     Sherrie Sport Referring Phys:  [id number] Sarah Ann Diagnosing Phys: Harrell Gave End MD  Sonographer Comments: Suboptimal apical window. IMPRESSIONS  1. Left ventricular ejection fraction, by estimation, is 60 to 65%. The left ventricle has normal function. The left ventricle has no regional wall motion abnormalities. There is severe left ventricular hypertrophy. Left ventricular diastolic parameters  are consistent with Grade III diastolic dysfunction (restrictive). Elevated left atrial pressure.  2. Right ventricular systolic function is normal. The right ventricular size is mildly enlarged. Moderately increased right ventricular wall thickness. Tricuspid regurgitation signal is inadequate for assessing PA pressure.  3. Left atrial size was moderately dilated.  4. Right atrial size was moderately dilated.  5. The mitral valve is normal in structure. Trivial mitral valve regurgitation. No evidence of mitral stenosis.  6. The aortic valve is tricuspid. Aortic valve regurgitation is not visualized. No aortic stenosis is present.  7. Aortic dilatation noted. There is borderline dilatation of the aortic root, measuring 38 mm. There is borderline dilatation of the ascending aorta, measuring 36 mm. FINDINGS  Left Ventricle: Left ventricular ejection fraction, by estimation, is 60 to 65%. The left ventricle has normal function. The left ventricle has no regional wall motion abnormalities. The left ventricular internal cavity size was normal in size. There is  severe left ventricular hypertrophy. Left ventricular diastolic parameters are consistent with Grade III diastolic dysfunction (restrictive). Elevated left atrial pressure. Right Ventricle: The right ventricular size is mildly enlarged. Moderately increased right ventricular wall thickness. Right ventricular systolic function is normal. Tricuspid regurgitation signal is inadequate for assessing PA pressure. Left Atrium: Left atrial size was  moderately dilated. Right Atrium: Right atrial size was moderately dilated. Pericardium: There is no evidence of pericardial effusion. Mitral Valve: The mitral valve is normal in structure. Trivial mitral valve regurgitation. No evidence of mitral valve stenosis. Tricuspid Valve: The tricuspid valve is grossly normal. Tricuspid valve regurgitation is trivial. Aortic Valve: The aortic valve is tricuspid. Aortic valve regurgitation is not visualized. No aortic stenosis is present. Aortic valve mean  gradient measures 3.0 mmHg. Aortic valve peak gradient measures 4.9 mmHg. Aortic valve area, by VTI measures 3.01 cm. Pulmonic Valve: The pulmonic valve was not well visualized. Pulmonic valve regurgitation is not visualized. No evidence of pulmonic stenosis. Aorta: Aortic dilatation noted. There is borderline dilatation of the aortic root, measuring 38 mm. There is borderline dilatation of the ascending aorta, measuring 36 mm. Pulmonary Artery: The pulmonary artery is not well seen. Venous: The inferior vena cava was not well visualized. IAS/Shunts: The interatrial septum was not well visualized.  LEFT VENTRICLE PLAX 2D LVIDd:         4.50 cm   Diastology LVIDs:         3.00 cm   LV e' medial:    3.59 cm/s LV PW:         2.10 cm   LV E/e' medial:  22.9 LV IVS:        2.20 cm   LV e' lateral:   5.11 cm/s LVOT diam:     2.10 cm   LV E/e' lateral: 16.1 LV SV:         56 LV SV Index:   24 LVOT Area:     3.46 cm  RIGHT VENTRICLE RV Basal diam:  4.60 cm RV S prime:     15.10 cm/s TAPSE (M-mode): 4.2 cm LEFT ATRIUM            Index        RIGHT ATRIUM           Index LA diam:      5.90 cm  2.58 cm/m   RA Area:     30.40 cm LA Vol (A2C): 212.0 ml 92.60 ml/m  RA Volume:   104.00 ml 45.43 ml/m LA Vol (A4C): 67.5 ml  29.48 ml/m  AORTIC VALVE AV Area (Vmax):    2.88 cm AV Area (Vmean):   2.86 cm AV Area (VTI):     3.01 cm AV Vmax:           110.50 cm/s AV Vmean:          77.400 cm/s AV VTI:            0.186 m AV Peak Grad:       4.9 mmHg AV Mean Grad:      3.0 mmHg LVOT Vmax:         92.00 cm/s LVOT Vmean:        63.800 cm/s LVOT VTI:          0.161 m LVOT/AV VTI ratio: 0.87  AORTA Ao Root diam: 3.80 cm MITRAL VALVE MV Area (PHT): 4.19 cm    SHUNTS MV Decel Time: 181 msec    Systemic VTI:  0.16 m MV E velocity: 82.30 cm/s  Systemic Diam: 2.10 cm MV A velocity: 32.60 cm/s MV E/A ratio:  2.52 Nelva Bush MD Electronically signed by Nelva Bush MD Signature Date/Time: 03/04/2022/3:43:46 PM    Final    DG Chest [place] 1 View  Result Date: 03/04/2022 CLINICAL DATA:  acute dyspnea and chest pain EXAM: PORTABLE CHEST 1 VIEW COMPARISON:  04/10/2021 FINDINGS: Cardiomegaly, vascular congestion. No overt edema. No confluent opacities or effusions. No acute bony abnormality. IMPRESSION: Cardiomegaly, vascular congestion. Electronically Signed   By: Rolm Baptise M.D.   On: 03/04/2022 03:35    Microbiology: Results for orders placed or performed during the hospital encounter of 03/04/22  MRSA Next Gen by PCR, Nasal     Status: None  Collection Time: 03/04/22 12:39 PM   Specimen: Nasal Mucosa; Nasal Swab  Result Value Ref Range Status   MRSA by PCR Next Gen NOT DETECTED NOT DETECTED Final    Comment: (NOTE) The GeneXpert MRSA Assay (FDA approved for NASAL specimens only), is one component of a comprehensive MRSA colonization surveillance program. It is not intended to diagnose MRSA infection nor to guide or monitor treatment for MRSA infections. Test performance is not FDA approved in patients less than 59 years old. Performed at [facility], [address] [place], [place] [address]     Labs: CBC: Recent Labs  Lab 03/04/22 0309 03/04/22 0615 03/05/22 0339 03/06/22 0429  WBC 10.2 9.5 7.7 7.5  NEUTROABS 4.7  --   --   --   HGB 15.1 13.7 13.5 14.4  HCT 47.8 42.9 43.1 45.1  MCV 80.9 81.1 81.5 81.0  PLT 327 242 241 279   Basic Metabolic Panel: Recent Labs  Lab 03/04/22 0309 03/04/22 0615  03/05/22 0339 03/06/22 0429  NA 141 141 143 139  K 4.3 3.4* 4.2 4.0  CL 107 109 110 106  CO2 19* 23 25 24   GLUCOSE 156* 146* 126* 104*  BUN 23* 25* 30* 28*  CREATININE 2.48* 2.42* 1.88* 1.63*  CALCIUM 9.7 9.3 8.8* 9.1  MG 2.1 2.1 2.1 2.2  PHOS  --  3.6  --   --    Liver Function Tests: Recent Labs  Lab 03/04/22 0309  AST 24  ALT 12  ALKPHOS 56  BILITOT 1.4*  PROT 8.1  ALBUMIN 4.1   CBG: Recent Labs  Lab 03/04/22 0534  GLUCAP 96    Discharge time spent: greater than 30 minutes.  Signed: 05/05/22, MD [facility] 03/06/2022

## 2022-03-07 ENCOUNTER — Ambulatory Visit: Payer: Self-pay

## 2022-03-07 NOTE — Patient Outreach (Signed)
  Care Coordination [facility] Note Transition Care Management Unsuccessful Follow-up Telephone Call  Date of discharge and from where:  03/06/22 [facility]  Attempts:  1st Attempt  Reason for unsuccessful TCM follow-up call:  Left voice message  Bevelyn Ngo, BSW, CDP Social Worker, Certified Dementia Practitioner Care Coordination [phone]

## 2022-03-10 ENCOUNTER — Telehealth: Payer: Self-pay

## 2022-03-10 NOTE — Telephone Encounter (Signed)
Transition Care Management Unsuccessful Follow-up Telephone Call  Date of discharge and from where:  03/06/2022, [facility]  Attempts:  2nd Attempt  Reason for unsuccessful TCM follow-up call:  Left voice message # [phone], call back requested.  Patient has appointment at [facility] with Corene Cornea, PA on 03/27/2022.

## 2022-03-10 NOTE — Telephone Encounter (Signed)
Pt's mom returned office call. She says that she will have pt to return nurse Jane's call when he return home.

## 2022-03-11 ENCOUNTER — Telehealth: Payer: Self-pay

## 2022-03-11 NOTE — Telephone Encounter (Signed)
Transition Care Management Follow-up Telephone Call Date of discharge and from where: 03/06/2022, [facility] How have you been since you were released from the hospital? He stated he is doing " pretty good." Any questions or concerns? Yes- he stated he is working on his disability. He has not contacted an attorney but said that he spoke to " someone" and has to call that person back.  I explained to him that if he is not working with an attorney, we may be able to refer him to [facility] for assistance with applying for disability. I told him to contact this office if he has additional questions after speaking to his contact.   Items Reviewed: Did the pt receive and understand the discharge instructions provided? Yes  Medications obtained and verified? Yes - he said he has all of his medications and did not have any questions about the med regime.  Other? No  Any new allergies since your discharge? No  Dietary orders reviewed? Yes Do you have support at home? Yes   Home Care and Equipment/Supplies: Were home health services ordered? no If so, what is the name of the agency? N/a  Has the agency set up a time to come to the patient's home? not applicable Were any new equipment or medical supplies ordered?  No What is the name of the medical supply agency? N/a Were you able to get the supplies/equipment? not applicable Do you have any questions related to the use of the equipment or supplies? No  Functional Questionnaire: (I = Independent and D = Dependent) ADLs: independent   Follow up appointments reviewed:  PCP Hospital f/u appt confirmed? Yes  Scheduled to see Corene Cornea, PA- 03/27/2022.   Specialist Hospital f/u appt confirmed? Yes  Scheduled to see cardiology - 03/25/2022.   Are transportation arrangements needed? No  If their condition worsens, is the pt aware to call PCP or go to the Emergency Dept.? Yes Was the patient provided with contact information for the PCP's office or  ED? Yes Was to pt encouraged to call back with questions or concerns? Yes

## 2022-03-13 ENCOUNTER — Ambulatory Visit: Payer: Self-pay | Admitting: Family

## 2022-03-17 ENCOUNTER — Other Ambulatory Visit: Payer: Self-pay

## 2022-03-20 ENCOUNTER — Ambulatory Visit (INDEPENDENT_AMBULATORY_CARE_PROVIDER_SITE_OTHER): Payer: Self-pay | Admitting: Internal Medicine

## 2022-03-20 ENCOUNTER — Other Ambulatory Visit: Payer: Self-pay

## 2022-03-20 ENCOUNTER — Encounter: Payer: Self-pay | Admitting: Internal Medicine

## 2022-03-20 VITALS — BP 156/86 | HR 76 | Ht 66.0 in | Wt 274.0 lb

## 2022-03-20 DIAGNOSIS — I4891 Unspecified atrial fibrillation: Secondary | ICD-10-CM

## 2022-03-20 DIAGNOSIS — I5032 Chronic diastolic (congestive) heart failure: Secondary | ICD-10-CM

## 2022-03-20 DIAGNOSIS — I248 Other forms of acute ischemic heart disease: Secondary | ICD-10-CM

## 2022-03-20 DIAGNOSIS — F1910 Other psychoactive substance abuse, uncomplicated: Secondary | ICD-10-CM

## 2022-03-20 DIAGNOSIS — I10 Essential (primary) hypertension: Secondary | ICD-10-CM

## 2022-03-20 MED ORDER — AMLODIPINE BESYLATE 10 MG PO TABS
10.0000 mg | ORAL_TABLET | Freq: Every day | ORAL | 0 refills | Status: DC
  Filled 2022-03-20: qty 90, 90d supply, fill #0

## 2022-03-20 NOTE — Progress Notes (Signed)
Follow-up Outpatient Visit Date: 03/20/2022  Primary Care Provider: Marcine Matar, MD [address] [place] [address] [place] [address]  Chief Complaint: Hospital follow-up for demand ischemia and atrial fibrillation with rapid ventricular response  HPI:  Brad [name] is a 46 y.o. male with history of chronic HFpEF, hypertension, asthma, and polysubstance abuse, who presents for follow-up of recent atrial fibrillation, chest pain, and HFpEF leading to hospitalization earlier this month.  He presented to the [facility] ED on 03/04/2022 with chest pain, having last used cocaine 2 days before presentation (he also endorsed daily marijuana use).  He was found to be in atrial fibrillation with rapid ventricular response and underwent cardioversion as medical therapy was not successful.  He required ICU admission due to post-DCCV hypotension.  His chest pain resolved following cardioversion.  Echo showed normal LVEF with severe LVH, restrictive diastolic parameters, and elevated filling pressure.  Moderate biatrial enlargement was noted.  HS-TnI peaked at 2103, felt to be due to demand ischemia as subsequent MPI showed no ischemia, scar, or coronary artery calcification.  He was discharged on apixaban, carvedilol, hydralazine, isosorbide mononitrate, furosemide, and Entresto.  Today, Brad [name] reports that he feels fairly well.  He experienced a brief episode of flutters in his chest that occurred after eating chicken wings.  He also believes that his episode of atrial fibrillation with rapid ventricular response leading to hospitalization earlier this month was brought on by excess sodium intake.  He would previously eat raw bouillon cubes but has been much more mindful of his sodium intake since his hospital stay.  He continues to use tobacco, marijuana, and cocaine, though he is trying to cut down on these.  He has not had any chest pain, shortness of breath, lightheadedness, or edema.  He tries to walk  1/4 to 1/2 mile every other day.  He notes that he ran out of amlodipine about a week ago but has otherwise been compliant with his medications.  Home blood pressures were well controlled, usually in the 120's/60's, before running out of amlodipine.  He has been taking apixaban faithfully and denies bleeding.  He missed his follow-up appointment with Clarisa Kindred, NP, after his hospitalization and is now scheduled to see her next week.  --------------------------------------------------------------------------------------------------  Past Medical History:  Diagnosis Date   Asthma    CHF (congestive heart failure) (HCC)    CKD (chronic kidney disease), stage III (HCC)    Cocaine abuse (HCC)    Diverticulitis    Hypertension    Tobacco use disorder    Past Surgical History:  Procedure Laterality Date   ANKLE SURGERY      Current Meds  Medication Sig   amLODipine (NORVASC) 10 MG tablet Take 1 tablet (10 mg total) by mouth once daily.   apixaban (ELIQUIS) 5 MG TABS tablet Take 1 tablet (5 mg total) by mouth 2 (two) times daily.   aspirin EC 81 MG EC tablet Take 1 tablet (81 mg total) by mouth daily. Swallow whole.   atorvastatin (LIPITOR) 80 MG tablet Take 1 tablet (80 mg total) by mouth daily.   carvedilol (COREG) 6.25 MG tablet Take 1 tablet (6.25 mg total) by mouth 2 (two) times daily with a meal.   furosemide (LASIX) 40 MG tablet Take 1 tablet (40 mg total) by mouth once daily.   hydrALAZINE (APRESOLINE) 100 MG tablet Take 1 tablet (100 mg total) by mouth 3 (three) times daily.   isosorbide mononitrate (IMDUR) 30 MG 24 hr  tablet Take 1 tablet (30 mg total) by mouth once daily.   sacubitril-valsartan (ENTRESTO) 97-103 MG Take 1 tablet by mouth 2 (two) times daily.    Allergies: Patient has no known allergies.  Social History   Tobacco Use   Smoking status: Some Days    Packs/day: 1.00    Types: Cigarettes   Smokeless tobacco: Never   Tobacco comments:    2-3 cigarettes    Vaping Use   Vaping Use: Never used  Substance Use Topics   Alcohol use: Not Currently    Comment: occas   Drug use: Yes    Types: Marijuana, Cocaine    Comment: daily, cocaine not as often    Family History  Problem Relation Age of Onset   Hypertension Mother     Review of Systems: A 12-system review of systems was performed and was negative except as noted in the HPI.  --------------------------------------------------------------------------------------------------  Physical Exam: BP (!) 156/86 (BP Location: Left Arm, Patient Position: Sitting, Cuff Size: Large)   Pulse 76   Ht 5\' 6"  (1.676 m)   Wt 274 lb (124.3 kg)   SpO2 98%   BMI 44.22 kg/m   General:  NAD. Neck: No JVD or HJR. Lungs: Clear to auscultation bilaterally without wheezes or crackles. Heart: Regular rate and rhythm without murmurs, rubs, or gallops. Abdomen: Soft, nontender, nondistended. Extremities: No lower extremity edema.  EKG: Normal sinus rhythm with first-degree AV block (PR interval 244 ms), borderline LVH, and lateral T wave inversions.  QTc is mildly prolonged (463 ms today, previously 477 ms).  No significant change noted from prior tracing on 03/04/2022.  Lab Results  Component Value Date   WBC 7.5 03/06/2022   HGB 14.4 03/06/2022   HCT 45.1 03/06/2022   MCV 81.0 03/06/2022   PLT 279 03/06/2022    Lab Results  Component Value Date   NA 139 03/06/2022   K 4.0 03/06/2022   CL 106 03/06/2022   CO2 24 03/06/2022   BUN 28 (H) 03/06/2022   CREATININE 1.63 (H) 03/06/2022   GLUCOSE 104 (H) 03/06/2022   ALT 12 03/04/2022    Lab Results  Component Value Date   CHOL 239 (H) 01/31/2022   HDL 52 01/31/2022   LDLCALC 169 (H) 01/31/2022   TRIG 103 01/31/2022   CHOLHDL 4.6 01/31/2022    --------------------------------------------------------------------------------------------------  ASSESSMENT AND PLAN: Demand ischemia: Recent episode of chest pain and elevated troponins in the  setting of atrial fibrillation with rapid ventricular response most likely represents demand ischemia given low risk MPI without ischemia or scar.  No further chest pain reported by Brad [name].  Continue current therapy with reinitiation of amlodipine.  For now, we will continue apixaban and aspirin, though discontinuation of aspirin in the future should be considered to minimize bleeding risk.  New onset atrial fibrillation: Brad [name] reports a single episode of transient palpitations in the setting of eating chicken wings.  It is difficult to know if this represented atrial fibrillation or some other arrhythmia.  He is in sinus rhythm today following emergent cardioversion in the ED earlier this month.  Mild first-degree AV block is noted.  However, I think it is reasonable to continue low-dose carvedilol.  We will also continue indefinite anticoagulation with apixaban given CHA2DS2-VASc score of at least 2 (hypertension and heart failure).  Chronic HFpEF: Brad [name] appears euvolemic on exam today.  Continue his current regimen with reinitiation of amlodipine today due to suboptimal blood pressure control.  Mr.  [name] was encouraged to follow-up with Ms. Hackney in the office next week, as scheduled.  Hypertension: Blood pressure moderately elevated today in the setting of having been off amlodipine for a week.  New prescription for amlodipine was provided.  Continue current doses of carvedilol, hydralazine, isosorbide mononitrate, and sacubitril-valsartan.  Polysubstance abuse: Unfortunately, Brad [name] continues to use tobacco, marijuana, and cocaine.  We spoke at length regarding the risks of continued substance abuse to his short-term and long-term health.  He is working on cutting back.  I strongly encouraged him to work on stopping all 3 of these substances as soon as possible.  Morbid obesity: BMI greater than 40.  Weight loss encouraged through diet and exercise.  Referral for  sleep study should be considered in the future given his morbid obesity, recently diagnosed atrial fibrillation, HFpEF, and hypertension.  Follow-up: Return to clinic in 3 months  Yvonne Kendall, MD 03/20/2022 9:14 AM

## 2022-03-20 NOTE — Patient Instructions (Signed)
Medication Instructions:   Your physician recommends that you continue on your current medications as directed. Please refer to the Current Medication list given to you today.  *If you need a refill on your cardiac medications before your next appointment, please call your pharmacy*   Lab Work:  None ordered  Testing/Procedures:  None ordered   Follow-Up: At [facility], you and your health needs are our priority.  As part of our continuing mission to provide you with exceptional heart care, we have created designated Provider Care Teams.  These Care Teams include your primary Cardiologist (physician) and Advanced Practice Providers (APPs -  Physician Assistants and Nurse Practitioners) who all work together to provide you with the care you need, when you need it.  We recommend signing up for the patient portal called "MyChart".  Sign up information is provided on this After Visit Summary.  MyChart is used to connect with patients for Virtual Visits (Telemedicine).  Patients are able to view lab/test results, encounter notes, upcoming appointments, etc.  Non-urgent messages can be sent to your provider as well.   To learn more about what you can do with MyChart, go to [email or web address].    Your next appointment:   3 month(s)  The format for your next appointment:   In Person  Provider:   You may see Christopher End, MD or one of the following Advanced Practice Providers on your designated Care Team:   Christopher Berge, NP Ryan Dunn, PA-C Cadence Furth, PA-C  Important Information About Sugar       

## 2022-03-21 ENCOUNTER — Encounter: Payer: Self-pay | Admitting: Internal Medicine

## 2022-03-21 DIAGNOSIS — I248 Other forms of acute ischemic heart disease: Secondary | ICD-10-CM | POA: Insufficient documentation

## 2022-03-21 DIAGNOSIS — F1910 Other psychoactive substance abuse, uncomplicated: Secondary | ICD-10-CM | POA: Insufficient documentation

## 2022-03-21 DIAGNOSIS — I5032 Chronic diastolic (congestive) heart failure: Secondary | ICD-10-CM | POA: Insufficient documentation

## 2022-03-25 ENCOUNTER — Ambulatory Visit: Payer: Self-pay | Attending: Family | Admitting: Family

## 2022-03-25 ENCOUNTER — Other Ambulatory Visit: Payer: Self-pay

## 2022-03-25 ENCOUNTER — Encounter: Payer: Self-pay | Admitting: Family

## 2022-03-25 VITALS — BP 153/88 | HR 99 | Resp 18 | Ht 66.0 in | Wt 278.5 lb

## 2022-03-25 DIAGNOSIS — I10 Essential (primary) hypertension: Secondary | ICD-10-CM

## 2022-03-25 DIAGNOSIS — I480 Paroxysmal atrial fibrillation: Secondary | ICD-10-CM

## 2022-03-25 DIAGNOSIS — F1410 Cocaine abuse, uncomplicated: Secondary | ICD-10-CM

## 2022-03-25 DIAGNOSIS — I4891 Unspecified atrial fibrillation: Secondary | ICD-10-CM | POA: Insufficient documentation

## 2022-03-25 DIAGNOSIS — N183 Chronic kidney disease, stage 3 unspecified: Secondary | ICD-10-CM | POA: Insufficient documentation

## 2022-03-25 DIAGNOSIS — I5032 Chronic diastolic (congestive) heart failure: Secondary | ICD-10-CM

## 2022-03-25 DIAGNOSIS — F17210 Nicotine dependence, cigarettes, uncomplicated: Secondary | ICD-10-CM | POA: Insufficient documentation

## 2022-03-25 DIAGNOSIS — Z720 Tobacco use: Secondary | ICD-10-CM

## 2022-03-25 DIAGNOSIS — I130 Hypertensive heart and chronic kidney disease with heart failure and stage 1 through stage 4 chronic kidney disease, or unspecified chronic kidney disease: Secondary | ICD-10-CM | POA: Insufficient documentation

## 2022-03-25 DIAGNOSIS — Z7901 Long term (current) use of anticoagulants: Secondary | ICD-10-CM | POA: Insufficient documentation

## 2022-03-25 MED ORDER — DAPAGLIFLOZIN PROPANEDIOL 10 MG PO TABS
10.0000 mg | ORAL_TABLET | Freq: Every day | ORAL | 3 refills | Status: DC
  Filled 2022-03-25: qty 90, 90d supply, fill #0

## 2022-03-25 MED ORDER — DAPAGLIFLOZIN PROPANEDIOL 10 MG PO TABS: 10.0000 mg | ORAL_TABLET | Freq: Every day | ORAL | 5 refills | Status: DC

## 2022-03-25 NOTE — Patient Instructions (Addendum)
Continue weighing daily and call for an overnight weight gain of 3 pounds or more or a weekly weight gain of more than 5 pounds.   If you have voicemail, please make sure your mailbox is cleaned out so that we may leave a message and please make sure to listen to any voicemails.    Start taking farxiga as 1 tablet every morning. This is for your heart.

## 2022-03-25 NOTE — Progress Notes (Signed)
Patient ID: Brad Lynch., male    DOB: 1976-07-30, 46 y.o.   MRN: [id number]   Brad [name] is a 46 y/o male with a history of HTN, tobacco use, substance abuse, rapid atrial fibrillation and chronic heart failure.   Echo report from 03/04/22 reviewed and showed an EF of 60-65% along with severe LVH. Echo report from 01/13/21 reviewed and showed an EF of 60-65% along with severe LVH.   Admitted 03/04/22 due to sudden onset of chest pain due to rapid atrial fibrillation. Cardioversion done. Cardiology consult obtained.  Discharged after 2 days. Was in the ED 10/07/21 due to left ankle sprain and low back pain after injury at work. Evaluated and released.    He presents today for a follow-up visit with a chief complaint of minimal fatigue upon moderate exertion. Describes this as chronic in nature. He has back pain along with this. He denies any difficulty sleeping, dizziness, chest tightness, cough, shortness of breath, chest pain, pedal edema, palpitations, abdominal distention or weight gain.   Has upcoming PCP appointment later this week.   Past Medical History:  Diagnosis Date   Asthma    CHF (congestive heart failure) (HCC)    CKD (chronic kidney disease), stage III (HCC)    Cocaine abuse (HCC)    Diverticulitis    Hypertension    Tobacco use disorder    Past Surgical History:  Procedure Laterality Date   ANKLE SURGERY     Family History  Problem Relation Age of Onset   Hypertension Mother    Social History   Tobacco Use   Smoking status: Some Days    Packs/day: 1.00    Types: Cigarettes   Smokeless tobacco: Never   Tobacco comments:    2-3 cigarettes   Substance Use Topics   Alcohol use: Not Currently    Comment: occas   No Known Allergies  Prior to Admission medications   Medication Sig Start Date End Date Taking? Authorizing Provider  amLODipine (NORVASC) 10 MG tablet Take 1 tablet (10 mg total) by mouth once daily. 03/20/22  Yes End, Cristal Deer, MD   apixaban (ELIQUIS) 5 MG TABS tablet Take 1 tablet (5 mg total) by mouth 2 (two) times daily. 03/06/22  Yes Marrion Coy, MD  aspirin EC 81 MG EC tablet Take 1 tablet (81 mg total) by mouth daily. Swallow whole. 01/14/21  Yes Arnetha Courser, MD  atorvastatin (LIPITOR) 80 MG tablet Take 1 tablet (80 mg total) by mouth daily. 03/07/22  Yes Marrion Coy, MD  carvedilol (COREG) 6.25 MG tablet Take 1 tablet (6.25 mg total) by mouth 2 (two) times daily with a meal. 03/06/22  Yes Marrion Coy, MD  furosemide (LASIX) 40 MG tablet Take 1 tablet (40 mg total) by mouth once daily. 12/20/21  Yes Clarisa Kindred A, FNP  hydrALAZINE (APRESOLINE) 100 MG tablet Take 1 tablet (100 mg total) by mouth 3 (three) times daily. 12/20/21  Yes Clarisa Kindred A, FNP  isosorbide mononitrate (IMDUR) 30 MG 24 hr tablet Take 1 tablet (30 mg total) by mouth once daily. 01/21/22  Yes Eland Lamantia A, FNP  sacubitril-valsartan (ENTRESTO) 97-103 MG Take 1 tablet by mouth 2 (two) times daily. 12/30/21  Yes Delma Freeze, FNP   Review of Systems  Constitutional:  Positive for fatigue. Negative for appetite change.  HENT:  Negative for congestion, postnasal drip and sore throat.   Eyes: Negative.   Respiratory:  Negative for apnea, cough, chest tightness and shortness of  breath.        + snoring  Cardiovascular:  Negative for chest pain, palpitations and leg swelling.  Gastrointestinal:  Negative for abdominal distention and abdominal pain.  Endocrine: Negative.   Genitourinary: Negative.   Musculoskeletal:  Positive for back pain (radiates down left leg). Negative for arthralgias and neck pain.  Skin: Negative.   Allergic/Immunologic: Negative.   Neurological:  Negative for dizziness and light-headedness.  Hematological:  Negative for adenopathy. Does not bruise/bleed easily.  Psychiatric/Behavioral:  Negative for dysphoric mood and sleep disturbance (sleeping on 1 pillow). The patient is not nervous/anxious.     Vitals:   03/25/22  1442  BP: (!) 153/88  Pulse: 99  Resp: 18  SpO2: 96%  Weight: 278 lb 8 oz (126.3 kg)  Height: 5\' 6"  (1.676 m)   Wt Readings from Last 3 Encounters:  03/25/22 278 lb 8 oz (126.3 kg)  03/20/22 274 lb (124.3 kg)  03/05/22 277 lb 1.9 oz (125.7 kg)   Lab Results  Component Value Date   CREATININE 1.63 (H) 03/06/2022   CREATININE 1.88 (H) 03/05/2022   CREATININE 2.42 (H) 03/04/2022   Physical Exam Vitals and nursing note reviewed.  Constitutional:      General: He is not in acute distress.    Appearance: Normal appearance.  HENT:     Head: Normocephalic and atraumatic.  Cardiovascular:     Rate and Rhythm: Normal rate. Rhythm irregular.  Pulmonary:     Effort: Pulmonary effort is normal. No respiratory distress.     Breath sounds: No wheezing or rales.  Abdominal:     General: There is no distension.     Palpations: Abdomen is soft.     Tenderness: There is no abdominal tenderness.  Musculoskeletal:        General: No tenderness.     Cervical back: Normal range of motion and neck supple.     Right lower leg: No edema.     Left lower leg: No edema.  Skin:    General: Skin is warm and dry.  Neurological:     General: No focal deficit present.     Mental Status: He is alert and oriented to person, place, and time.     Motor: No weakness.     Gait: Gait normal.  Psychiatric:        Mood and Affect: Mood normal.        Behavior: Behavior normal.        Thought Content: Thought content normal.     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)- - NYHA class II - euvolemic today - weighing daily; reminded to call for an overnight weight gain of > 2 pounds or a weekly weight gain of > 5 pounds - weight up 2 pounds from last visit here 2 months ago - does not add salt to his food; reviewed the importance of reading food labels for sodium content - on GDMT of entresto - add farxiga 10mg  daily; voucher  provided - will check BMP next visit - BNP 03/04/22 was 713.0  2: HTN with CKD- - BP mildly elevated (153/88) - saw PCP Wynetta Emery) 01/31/22 - BMP 03/06/22 reviewed and showed sodium 139, potassium 4.0, creatinine 1.63 and GFR 52  3: Tobacco use- - smoking 2 cigarettes daily - complete cessation discussed for 3 minutes with him  4: Cocaine abuse- - says that he hasn't used cocaine since his recent discharge  5:  Atrial fibrillation- - saw cardiology (End) 03/20/22 - taking apixaban   Medication bottles reviewed.   Return in 1 month, sooner if needed.

## 2022-03-27 ENCOUNTER — Ambulatory Visit: Payer: Self-pay | Attending: Physician Assistant | Admitting: Physician Assistant

## 2022-03-27 ENCOUNTER — Other Ambulatory Visit: Payer: Self-pay

## 2022-03-27 VITALS — BP 155/82 | HR 82 | Temp 98.3°F | Resp 18 | Ht 66.0 in | Wt 277.4 lb

## 2022-03-27 DIAGNOSIS — I10 Essential (primary) hypertension: Secondary | ICD-10-CM

## 2022-03-27 DIAGNOSIS — R570 Cardiogenic shock: Secondary | ICD-10-CM

## 2022-03-27 DIAGNOSIS — Z09 Encounter for follow-up examination after completed treatment for conditions other than malignant neoplasm: Secondary | ICD-10-CM

## 2022-03-27 DIAGNOSIS — I4891 Unspecified atrial fibrillation: Secondary | ICD-10-CM

## 2022-03-27 DIAGNOSIS — F1910 Other psychoactive substance abuse, uncomplicated: Secondary | ICD-10-CM

## 2022-03-27 DIAGNOSIS — N1832 Chronic kidney disease, stage 3b: Secondary | ICD-10-CM

## 2022-03-27 DIAGNOSIS — I5032 Chronic diastolic (congestive) heart failure: Secondary | ICD-10-CM

## 2022-03-27 MED ORDER — ATORVASTATIN CALCIUM 80 MG PO TABS
80.0000 mg | ORAL_TABLET | Freq: Every day | ORAL | 3 refills | Status: DC
  Filled 2022-03-27 – 2022-04-16 (×2): qty 30, 30d supply, fill #0
  Filled 2022-05-26: qty 30, 30d supply, fill #1
  Filled 2022-07-06: qty 30, 30d supply, fill #2
  Filled 2022-08-22: qty 30, 30d supply, fill #3

## 2022-03-27 MED ORDER — CARVEDILOL 6.25 MG PO TABS
6.2500 mg | ORAL_TABLET | Freq: Two times a day (BID) | ORAL | 3 refills | Status: DC
  Filled 2022-03-27 – 2022-05-13 (×2): qty 60, 30d supply, fill #0
  Filled 2022-08-22: qty 60, 30d supply, fill #1

## 2022-03-27 MED ORDER — APIXABAN 5 MG PO TABS
5.0000 mg | ORAL_TABLET | Freq: Two times a day (BID) | ORAL | 2 refills | Status: DC
  Filled 2022-03-27: qty 60, 30d supply, fill #0

## 2022-03-27 NOTE — Progress Notes (Signed)
Patient ID: Brad [name]., male   DOB: 12-Dec-1975, 46 y.o.   MRN: [id number]    Kahlen Heyer, is a 46 y.o. male  [id number]  [id number]  DOB - 02-23-1976  No chief complaint on file.      Subjective:   Brad [name] is a 46 y.o. male here today for a follow up visit After hospitalization 7/11-7/13/2023 for cardiogenic shock.  Seen by CHF clinic 03/25/2022.  He is down 1 pound since visit 03/25/2022.  He is no longer using cocaine and denies other drug use.  Smoking 2 to 5 cigs daily.  No CP or SOB.    From discharge summary: Discharge Diagnoses: Principal Problem:   Cardiogenic shock ([place]) Active Problems:   Cocaine abuse (HCC)   Elevated troponin   Acute renal failure superimposed on stage 3a chronic kidney disease (HCC)   Acute heart failure (HCC)   Essential hypertension   Acute on chronic diastolic CHF (congestive heart failure) (HCC)   Type 2 diabetes mellitus with morbid obesity ([place])   Marijuana user   Obesity, Class III, BMI 40-49.9 (morbid obesity) ([place])   New onset atrial fibrillation (HCC)   NSTEMI (non-ST elevated myocardial infarction) [facility])  Hospital Course: [name] is a 46 year old African-American male with a history of congestive heart failure, hypertension, CKD stage III, tobacco use disorder, and cocaine abuse who presented to the ED via EMS with complaints of sudden onset chest pain. Upon arriving the emergency room, patient was found to have rapid atrial fibrillation, patient was diaphoretic and hypotensive, cardioversion was performed.  Patient was admitted to the ICU for close monitoring.  His peak troponin was 2903.  EKG did not show ST elevation.  Patient also had acute renal failure with creatinine of 2.42.  He is treated with heparin and is seen by cardiology. Patient had a nuclear stress test performed today, no ischemic changes.  Condition caused by cocaine abuse.  Medically stable to be discharged.   Assessment and  Plan: Cardiogenic shock. New onset atrial fibrillation with RVR status post cardioversion. NSTEMI secondary to cocaine abuse. Ruled in, POA Cocaine abuse. Patient developed hypotension at the time of admission, he also had atrial fibrillation with RVR.  After cardioversion, blood pressure is better. Patient is treated with heparin drip for atrial fibrillation and elevated troponin. Nuclear stress test came back negative, patient had a non-STEMI, but caused by cocaine abuse. At this point, condition has improved.  Patient be treated with Eliquis per recommendation from cardiology.  Patient be followed up with cardiology as outpatient.     Acute on chronic diastolic congestive heart failure. Reviewed echocardiogram, ejection fraction 60 to 65% with severe LV hypertrophy, grade 3 diastolic dysfunction. Patient condition appears to be stabilized.   Acute kidney injury superimposed on chronic kidney disease stage IIIa. Reviewed prior lab, patient has stage IIIa chronic kidney disease, much worsening renal function at time of admission, but has been better.   Morbid obesity. Diet and exercise advised  Cocaine abuse, marijuana use. Advised to quit.  FROM CHF CLINIC 03/25/2022: Assessment & Plan:   1: Chronic heart failure with preserved ejection fraction with structural changes (LVH)- - NYHA class II - euvolemic today - weighing daily; reminded to call for an overnight weight gain of > 2 pounds or a weekly weight gain of > 5 pounds - weight up 2 pounds from last visit here 2 months ago - does not add salt to his food; reviewed the importance of reading food  labels for sodium content - on GDMT of entresto - add farxiga 10mg  daily; voucher provided - will check BMP next visit - BNP 03/04/22 was 713.0   2: HTN with CKD- - BP mildly elevated (153/88) - saw PCP 05/05/22) 01/31/22 - BMP 03/06/22 reviewed and showed sodium 139, potassium 4.0, creatinine 1.63 and GFR 52   3: Tobacco use- -  smoking 2 cigarettes daily - complete cessation discussed for 3 minutes with him   4: Cocaine abuse- - says that he hasn't used cocaine since his recent discharge   5: Atrial fibrillation- - saw cardiology (End) 03/20/22 - taking apixaban No problems updated.  ALLERGIES: No Known Allergies  PAST MEDICAL HISTORY: Past Medical History:  Diagnosis Date   Arrhythmia    atrial fibrillation   Asthma    CHF (congestive heart failure) (HCC)    CKD (chronic kidney disease), stage III (HCC)    Cocaine abuse (HCC)    Diverticulitis    Hypertension    Tobacco use disorder     MEDICATIONS AT HOME: Prior to Admission medications   Medication Sig Start Date End Date Taking? Authorizing Provider  amLODipine (NORVASC) 10 MG tablet Take 1 tablet (10 mg total) by mouth once daily. 03/20/22  Yes End, 03/22/22, MD  aspirin EC 81 MG EC tablet Take 1 tablet (81 mg total) by mouth daily. Swallow whole. 01/14/21  Yes 01/16/21, MD  dapagliflozin propanediol (FARXIGA) 10 MG TABS tablet Take 1 tablet (10 mg total) by mouth daily before breakfast. 03/25/22  Yes 05/25/22 A, FNP  dapagliflozin propanediol (FARXIGA) 10 MG TABS tablet Take 1 tablet (10 mg total) by mouth daily before breakfast. 03/25/22  Yes 05/25/22 A, FNP  furosemide (LASIX) 40 MG tablet Take 1 tablet (40 mg total) by mouth once daily. 12/20/21  Yes 12/22/21 A, FNP  hydrALAZINE (APRESOLINE) 100 MG tablet Take 1 tablet (100 mg total) by mouth 3 (three) times daily. 12/20/21  Yes 12/22/21 A, FNP  isosorbide mononitrate (IMDUR) 30 MG 24 hr tablet Take 1 tablet (30 mg total) by mouth once daily. 01/21/22  Yes Hackney, Tina A, FNP  sacubitril-valsartan (ENTRESTO) 97-103 MG Take 1 tablet by mouth 2 (two) times daily. 12/30/21  Yes Hackney, 03/01/22, FNP  apixaban (ELIQUIS) 5 MG TABS tablet Take 1 tablet (5 mg total) by mouth 2 (two) times daily. 03/27/22   05/27/22, PA-C  atorvastatin (LIPITOR) 80 MG tablet Take 1 tablet (80  mg total) by mouth daily. 03/27/22   05/27/22, PA-C  carvedilol (COREG) 6.25 MG tablet Take 1 tablet (6.25 mg total) by mouth 2 (two) times daily with a meal. 03/27/22   Ameira Alessandrini, 05/27/22, PA-C    ROS: Neg HEENT Neg resp Neg cardiac Neg GI Neg GU Neg MS Neg psych Neg neuro  Objective:   Vitals:   03/27/22 1556  BP: (!) 155/82  Pulse: 82  Resp: 18  Temp: 98.3 F (36.8 C)  SpO2: 95%  Weight: 277 lb 6.4 oz (125.8 kg)  Height: 5\' 6"  (1.676 m)   Exam General appearance : Awake, alert, not in any distress. Speech Clear. Not toxic looking HEENT: Atraumatic and Normocephalic Neck: Supple, no JVD. No cervical lymphadenopathy.  Chest: Good air entry bilaterally, CTAB.  No rales/rhonchi/wheezing CVS: S1 S2 regular, no murmurs.  Extremities: B/L Lower Ext shows no edema, both legs are warm to touch Neurology: Awake alert, and oriented X 3, CN II-XII intact, Non focal Skin: No  Rash  Data Review Lab Results  Component Value Date   HGBA1C 6.2 (H) 03/04/2022   HGBA1C 6.3 01/31/2022   HGBA1C 6.7 (H) 01/13/2021    Assessment & Plan   1. Chronic diastolic congestive heart failure (HCC) Continue current regimen and daily weights as directed by CHF clinis - carvedilol (COREG) 6.25 MG tablet; Take 1 tablet (6.25 mg total) by mouth 2 (two) times daily with a meal.  Dispense: 60 tablet; Refill: 3  2. Stage 3b chronic kidney disease (HCC) Cr coming down 2 weeks ago  3. Essential hypertension Take meds as directed and check BP OOO.  Did not take amlodipine today  4. Polysubstance abuse (HCC) [employer].org is the website for narcotics anonymous [employer].cy (website) or 912 181 3081 is the information for alcoholics anonymous Both are free and immediately available for help with alcohol and drug use   5. Cardiogenic shock (HCC) resolved - apixaban (ELIQUIS) 5 MG TABS tablet; Take 1 tablet (5 mg total) by mouth 2 (two) times daily.  Dispense: 60 tablet; Refill: 2 -  atorvastatin (LIPITOR) 80 MG tablet; Take 1 tablet (80 mg total) by mouth daily.  Dispense: 30 tablet; Refill: 3  6. Hospital discharge follow-up  7. New onset atrial fibrillation (HCC) Managed by cardiology - apixaban (ELIQUIS) 5 MG TABS tablet; Take 1 tablet (5 mg total) by mouth 2 (two) times daily.  Dispense: 60 tablet; Refill: 2 - atorvastatin (LIPITOR) 80 MG tablet; Take 1 tablet (80 mg total) by mouth daily.  Dispense: 30 tablet; Refill: 3    Return in about 3 months (around 06/27/2022) for PCP for chronic conditions.  The patient was given clear instructions to go to ER or return to medical center if symptoms don't improve, worsen or new problems develop. The patient verbalized understanding. The patient was told to call to get lab results if they haven't heard anything in the next week.      Georgian Co, PA-C [facility] [place], [place] [phone]   03/27/2022, 4:14 PM

## 2022-04-01 ENCOUNTER — Telehealth: Payer: Self-pay

## 2022-04-01 NOTE — Telephone Encounter (Signed)
Notified patient that [employer] Patient Assistance Program for [place] [employer] will be ending 05/15/22 and that we will need to fill out a new application and have him bring proof of income for re-enrollment.  Patient states he has no income and has been out of work since Feb./2023. He states he will be able to make his next appointment on 04/29/22 and agreed to fill out re-enrollment application at that time. Suanne Marker, RN

## 2022-04-02 ENCOUNTER — Telehealth: Payer: Self-pay | Admitting: Internal Medicine

## 2022-04-02 NOTE — Telephone Encounter (Signed)
Patient states someone in [facility] came and talked to him about disability. He says their last name was Raul Del, but does not have any of the information they gave him.

## 2022-04-02 NOTE — Telephone Encounter (Signed)
Spoke with pt and advised protocol for disability paperwork in our office.  Pt trying to find out who spoke with him while he was in the hospital last month regarding disability. Pt will call the hospital as I am unable to provide what he is asking about.  Pt voiced understanding of protocol for disability if he needs to apply on his own.  Pt appreciative and has no further questions at this time.

## 2022-04-14 ENCOUNTER — Other Ambulatory Visit: Payer: Self-pay | Admitting: Pharmacist

## 2022-04-14 NOTE — Chronic Care Management (AMB) (Signed)
Patient outreached by Jarome Matin, PharmD Candidate on 04/14/2022 to discuss hypertension.   Patient has an automated home blood pressure machine. They report home readings around 150/90 - similar to clinic visits.   Medication review was performed. They are taking medications as prescribed.   The following barriers to adherence were noted:  - Questions/Concerns about medications. Patient is not familiar with which medications he takes for which conditions.   The following interventions were completed:  - Medications were reviewed  - Patient was educated on medications, including indication and administration. Patient reported being out of furosemide and atorvastatin. Patient does have refills, so counseled to call pharmacy to refill.  - Patient was educated on use of adherence strategies, like a pill box or alarms  - Patient was educated on proper technique to check home blood pressure and reminded to bring home machine and readings to next provider appointment  - Patient was counseled on lifestyle modifications to improve blood pressure. Patient reports walking to the church and back everyday for exercise. Additionally, he limits the amount of salt used (he does not use seasoning).   The patient has follow up scheduled:  Cardiology: 04/29/2022 at 3:00 PM    Catie TClearance Coots, PharmD, [facility]  [phone]

## 2022-04-16 ENCOUNTER — Other Ambulatory Visit: Payer: Self-pay

## 2022-04-21 ENCOUNTER — Other Ambulatory Visit: Payer: Self-pay

## 2022-04-22 ENCOUNTER — Other Ambulatory Visit: Payer: Self-pay

## 2022-04-22 MED ORDER — APIXABAN 5 MG PO TABS
ORAL_TABLET | ORAL | 3 refills | Status: DC
  Filled 2022-04-29: qty 180, 90d supply, fill #0
  Filled 2022-10-14: qty 360, 180d supply, fill #1

## 2022-04-22 MED ORDER — SACUBITRIL-VALSARTAN 97-103 MG PO TABS: ORAL_TABLET | ORAL | 3 refills | Status: DC

## 2022-04-28 NOTE — Progress Notes (Deleted)
Patient ID: Brad [name]., male    DOB: 06-13-1976, 46 y.o.   MRN: [id number]   Brad [name] is a 46 y/o male with a history of HTN, tobacco use, substance abuse, rapid atrial fibrillation and chronic heart failure.   Echo report from 03/04/22 reviewed and showed an EF of 60-65% along with severe LVH. Echo report from 01/13/21 reviewed and showed an EF of 60-65% along with severe LVH.   Admitted 03/04/22 due to sudden onset of chest pain due to rapid atrial fibrillation. Cardioversion done. Cardiology consult obtained.  Discharged after 2 days.    He presents today for a follow-up visit with a chief complaint of    Past Medical History:  Diagnosis Date   Arrhythmia    atrial fibrillation   Asthma    CHF (congestive heart failure) (HCC)    CKD (chronic kidney disease), stage III (HCC)    Cocaine abuse (HCC)    Diverticulitis    Hypertension    Tobacco use disorder    Past Surgical History:  Procedure Laterality Date   ANKLE SURGERY     Family History  Problem Relation Age of Onset   Hypertension Mother    Social History   Tobacco Use   Smoking status: Some Days    Packs/day: 1.00    Types: Cigarettes   Smokeless tobacco: Never   Tobacco comments:    2-3 cigarettes   Substance Use Topics   Alcohol use: Not Currently    Comment: occas   No Known Allergies   Review of Systems  Constitutional:  Positive for fatigue. Negative for appetite change.  HENT:  Negative for congestion, postnasal drip and sore throat.   Eyes: Negative.   Respiratory:  Negative for apnea, cough, chest tightness and shortness of breath.        + snoring  Cardiovascular:  Negative for chest pain, palpitations and leg swelling.  Gastrointestinal:  Negative for abdominal distention and abdominal pain.  Endocrine: Negative.   Genitourinary: Negative.   Musculoskeletal:  Positive for back pain (radiates down left leg). Negative for arthralgias and neck pain.  Skin: Negative.    Allergic/Immunologic: Negative.   Neurological:  Negative for dizziness and light-headedness.  Hematological:  Negative for adenopathy. Does not bruise/bleed easily.  Psychiatric/Behavioral:  Negative for dysphoric mood and sleep disturbance (sleeping on 1 pillow). The patient is not nervous/anxious.       Physical Exam Vitals and nursing note reviewed.  Constitutional:      General: He is not in acute distress.    Appearance: Normal appearance.  HENT:     Head: Normocephalic and atraumatic.  Cardiovascular:     Rate and Rhythm: Normal rate. Rhythm irregular.  Pulmonary:     Effort: Pulmonary effort is normal. No respiratory distress.     Breath sounds: No wheezing or rales.  Abdominal:     General: There is no distension.     Palpations: Abdomen is soft.     Tenderness: There is no abdominal tenderness.  Musculoskeletal:        General: No tenderness.     Cervical back: Normal range of motion and neck supple.     Right lower leg: No edema.     Left lower leg: No edema.  Skin:    General: Skin is warm and dry.  Neurological:     General: No focal deficit present.     Mental Status: He is alert and oriented to person, place, and time.  Motor: No weakness.     Gait: Gait normal.  Psychiatric:        Mood and Affect: Mood normal.        Behavior: Behavior normal.        Thought Content: Thought content normal.     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)- - NYHA class II - euvolemic today - weighing daily; reminded to call for an overnight weight gain of > 2 pounds or a weekly weight gain of > 5 pounds - weight 278.8 pounds from last visit here 1 month ago - does not add salt to his food; reviewed the importance of reading food labels for sodium content - on GDMT of entresto & farxiga - will check BMP today as farxiga started at last visit - BNP 03/04/22 was 713.0  2: HTN with CKD- - BP  - saw  PCP Sharon Seller) 03/27/22 - BMP 03/06/22 reviewed and showed sodium 139, potassium 4.0, creatinine 1.63 and GFR 52  3: Tobacco use- - smoking 2 cigarettes daily - complete cessation discussed for 3 minutes with him  4: Cocaine abuse- - says that he hasn't used cocaine since his recent discharge  5: Atrial fibrillation- - saw cardiology (End) 03/20/22 - taking apixaban   Medication bottles reviewed.

## 2022-04-29 ENCOUNTER — Ambulatory Visit: Payer: Self-pay | Admitting: Family

## 2022-04-29 ENCOUNTER — Encounter: Payer: Self-pay | Admitting: Pharmacy Technician

## 2022-04-29 ENCOUNTER — Other Ambulatory Visit: Payer: Self-pay

## 2022-04-29 NOTE — Patient Outreach (Signed)
Received confirmation from [employer] indicating patient was denied due to not providing proof of income information.  [employer] spoke with [employer].  Scharlene Gloss indicated that [employer] could not accept the letter that I sent with the application stating that patient has no income and is supported by family.  Needs SS award letter from patient's mother, Windell Moulding.  Windell Moulding to contact SS to obtain letter and bring to me.  Sherilyn Dacosta Patient Advocate Specialist [facility]

## 2022-05-07 ENCOUNTER — Telehealth: Payer: Self-pay | Admitting: Family

## 2022-05-07 NOTE — Telephone Encounter (Signed)
Faxed Patients medical records request from his lawyer on to medical records and notified patient that we were not able to get records that they needed from our office.   Cammie Faulstich, NT

## 2022-05-09 ENCOUNTER — Other Ambulatory Visit: Payer: Self-pay

## 2022-05-13 ENCOUNTER — Other Ambulatory Visit: Payer: Self-pay

## 2022-05-13 ENCOUNTER — Ambulatory Visit (HOSPITAL_BASED_OUTPATIENT_CLINIC_OR_DEPARTMENT_OTHER): Payer: Self-pay | Admitting: Family

## 2022-05-13 ENCOUNTER — Encounter: Payer: Self-pay | Admitting: Family

## 2022-05-13 ENCOUNTER — Other Ambulatory Visit
Admission: RE | Admit: 2022-05-13 | Discharge: 2022-05-13 | Disposition: A | Discharge status: Home / Self Care | Payer: Self-pay | Source: Ambulatory Visit | Attending: Family | Admitting: Family

## 2022-05-13 VITALS — BP 145/107 | HR 78 | Resp 14 | Ht 66.0 in | Wt 272.5 lb

## 2022-05-13 DIAGNOSIS — F17210 Nicotine dependence, cigarettes, uncomplicated: Secondary | ICD-10-CM | POA: Insufficient documentation

## 2022-05-13 DIAGNOSIS — I480 Paroxysmal atrial fibrillation: Secondary | ICD-10-CM

## 2022-05-13 DIAGNOSIS — I10 Essential (primary) hypertension: Secondary | ICD-10-CM

## 2022-05-13 DIAGNOSIS — N183 Chronic kidney disease, stage 3 unspecified: Secondary | ICD-10-CM | POA: Insufficient documentation

## 2022-05-13 DIAGNOSIS — Z7984 Long term (current) use of oral hypoglycemic drugs: Secondary | ICD-10-CM | POA: Insufficient documentation

## 2022-05-13 DIAGNOSIS — F1410 Cocaine abuse, uncomplicated: Secondary | ICD-10-CM

## 2022-05-13 DIAGNOSIS — I4891 Unspecified atrial fibrillation: Secondary | ICD-10-CM | POA: Insufficient documentation

## 2022-05-13 DIAGNOSIS — Z720 Tobacco use: Secondary | ICD-10-CM

## 2022-05-13 DIAGNOSIS — I5032 Chronic diastolic (congestive) heart failure: Secondary | ICD-10-CM

## 2022-05-13 DIAGNOSIS — M549 Dorsalgia, unspecified: Secondary | ICD-10-CM | POA: Insufficient documentation

## 2022-05-13 DIAGNOSIS — I130 Hypertensive heart and chronic kidney disease with heart failure and stage 1 through stage 4 chronic kidney disease, or unspecified chronic kidney disease: Secondary | ICD-10-CM | POA: Insufficient documentation

## 2022-05-13 DIAGNOSIS — Z7901 Long term (current) use of anticoagulants: Secondary | ICD-10-CM | POA: Insufficient documentation

## 2022-05-13 DIAGNOSIS — M79605 Pain in left leg: Secondary | ICD-10-CM | POA: Insufficient documentation

## 2022-05-13 DIAGNOSIS — Z79899 Other long term (current) drug therapy: Secondary | ICD-10-CM | POA: Insufficient documentation

## 2022-05-13 DIAGNOSIS — M79672 Pain in left foot: Secondary | ICD-10-CM | POA: Insufficient documentation

## 2022-05-13 LAB — BASIC METABOLIC PANEL
Anion gap: 8 (ref 5–15)
BUN: 31 mg/dL — ABNORMAL HIGH (ref 6–20)
CO2: 25 mmol/L (ref 22–32)
Calcium: 9.7 mg/dL (ref 8.9–10.3)
Chloride: 107 mmol/L (ref 98–111)
Creatinine, Ser: 1.77 mg/dL — ABNORMAL HIGH (ref 0.61–1.24)
GFR, Estimated: 47 mL/min — ABNORMAL LOW (ref >60)
Glucose, Bld: 97 mg/dL (ref 70–99)
Potassium: 4.3 mmol/L (ref 3.5–5.1)
Sodium: 140 mmol/L (ref 135–145)

## 2022-05-13 NOTE — Patient Instructions (Addendum)
Resume weighing daily and call for an overnight weight gain of 3 pounds or more or a weekly weight gain of more than 5 pounds.   If you have voicemail, please make sure your mailbox is cleaned out so that we may leave a message and please make sure to listen to any voicemails.     

## 2022-05-13 NOTE — Progress Notes (Signed)
Patient ID: Brad [name]., male    DOB: [birth year], 46 y.o.   MRN: [id number]   [name] is a 46 y/o male with a history of HTN, tobacco use, substance abuse, rapid atrial fibrillation and chronic heart failure.   Echo report from 03/04/22 reviewed and showed an EF of 60-65% along with severe LVH. Echo report from 01/13/21 reviewed and showed an EF of 60-65% along with severe LVH.   Admitted 03/04/22 due to sudden onset of chest pain due to rapid atrial fibrillation. Cardioversion done. Cardiology consult obtained.  Discharged after 2 days.    He presents today for a follow-up visit with a chief complaint of minimal fatigue upon moderate exertion. Describes this as chronic in nature. He has associated back pain with radiation down left leg, left leg / foot pain along with this. He denies any difficulty sleeping, abdominal distention, palpitations, pedal edema, chest pain, shortness of breath, cough, dizziness or weight gain.   Has not been weighing daily but does have scales at home.   Says his back pain is his biggest problem. He got hurt at work and is currently going through a workman's comp case. Reports frustration with the process because he says that the work has it on tape when something fell on him. Hasn't had any income since February due to this because he can't stand for long periods of time without his back hurting even more.   Has tolerated farxiga but ran out ~ 1 month ago and is waiting on medication to be mailed to him from the company through [employer]  Past Medical History:  Diagnosis Date   Arrhythmia    atrial fibrillation   Asthma    CHF (congestive heart failure) (HCC)    CKD (chronic kidney disease), stage III ([place])    Cocaine abuse ([place])    Diverticulitis    Hypertension    Tobacco use disorder    Past Surgical History:  Procedure Laterality Date   ANKLE SURGERY     Family History  Problem Relation Age of Onset   Hypertension Mother    Social  History   Tobacco Use   Smoking status: Some Days    Packs/day: 1.00    Types: Cigarettes   Smokeless tobacco: Never   Tobacco comments:    2-3 cigarettes   Substance Use Topics   Alcohol use: Not Currently    Comment: occas   No Known Allergies  Prior to Admission medications   Medication Sig Start Date End Date Taking? Authorizing Provider  amLODipine (NORVASC) 10 MG tablet Take 1 tablet (10 mg total) by mouth once daily. 03/20/22  Yes End, Harrell Gave, MD  apixaban (ELIQUIS) 5 MG TABS tablet Take one tablet by mouth twice a day 04/11/22  Yes   aspirin EC 81 MG EC tablet Take 1 tablet (81 mg total) by mouth daily. Swallow whole. 01/14/21  Yes Lorella Nimrod, MD  atorvastatin (LIPITOR) 80 MG tablet Take 1 tablet (80 mg total) by mouth daily. 03/27/22  Yes Freeman Caldron M, PA-C  carvedilol (COREG) 6.25 MG tablet Take 1 tablet (6.25 mg total) by mouth 2 (two) times daily with a meal. 03/27/22  Yes McClung, Angela M, PA-C  furosemide (LASIX) 40 MG tablet Take 1 tablet (40 mg total) by mouth once daily. 12/20/21  Yes Darylene Price A, FNP  hydrALAZINE (APRESOLINE) 100 MG tablet Take 1 tablet (100 mg total) by mouth 3 (three) times daily. 12/20/21  Yes Alisa Graff,  FNP  isosorbide mononitrate (IMDUR) 30 MG 24 hr tablet Take 1 tablet (30 mg total) by mouth once daily. 01/21/22  Yes Fareeha Evon A, FNP  sacubitril-valsartan (ENTRESTO) 97-103 MG Take one tablet by mouth twice a day 04/11/22  Yes   dapagliflozin propanediol (FARXIGA) 10 MG TABS tablet Take 1 tablet (10 mg total) by mouth daily before breakfast. Patient not taking: Reported on 05/13/2022 03/25/22   Alisa Graff, FNP   Review of Systems  Constitutional:  Positive for fatigue. Negative for appetite change.  HENT:  Negative for congestion, postnasal drip and sore throat.   Eyes: Negative.   Respiratory:  Negative for apnea, cough, chest tightness and shortness of breath.        + snoring  Cardiovascular:  Negative for chest pain,  palpitations and leg swelling.  Gastrointestinal:  Negative for abdominal distention and abdominal pain.  Endocrine: Negative.   Genitourinary: Negative.   Musculoskeletal:  Positive for arthralgias (intermittent left leg/ foot) and back pain (radiates down left leg). Negative for neck pain.  Skin: Negative.   Allergic/Immunologic: Negative.   Neurological:  Negative for dizziness and light-headedness.  Hematological:  Negative for adenopathy. Does not bruise/bleed easily.  Psychiatric/Behavioral:  Negative for dysphoric mood and sleep disturbance (sleeping on 1 pillow). The patient is not nervous/anxious.    Vitals:   05/13/22 1401  BP: (!) 145/107  Pulse: 78  Resp: 14  SpO2: 96%  Weight: 272 lb 8 oz (123.6 kg)  Height: 5\' 6"  (1.676 m)   Wt Readings from Last 3 Encounters:  05/13/22 272 lb 8 oz (123.6 kg)  03/27/22 277 lb 6.4 oz (125.8 kg)  03/25/22 278 lb 8 oz (126.3 kg)   Lab Results  Component Value Date   CREATININE 1.63 (H) 03/06/2022   CREATININE 1.88 (H) 03/05/2022   CREATININE 2.42 (H) 03/04/2022   Physical Exam Vitals and nursing note reviewed.  Constitutional:      General: He is not in acute distress.    Appearance: Normal appearance.  HENT:     Head: Normocephalic and atraumatic.  Cardiovascular:     Rate and Rhythm: Normal rate. Rhythm irregular.  Pulmonary:     Effort: Pulmonary effort is normal. No respiratory distress.     Breath sounds: No wheezing or rales.  Abdominal:     General: There is no distension.     Palpations: Abdomen is soft.     Tenderness: There is no abdominal tenderness.  Musculoskeletal:        General: No tenderness.     Cervical back: Normal range of motion and neck supple.     Right lower leg: No edema.     Left lower leg: No edema.  Skin:    General: Skin is warm and dry.  Neurological:     General: No focal deficit present.     Mental Status: He is alert and oriented to person, place, and time.     Motor: No weakness.      Gait: Gait normal.  Psychiatric:        Mood and Affect: Mood normal.        Behavior: Behavior normal.        Thought Content: Thought content normal.     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)- - NYHA class II - euvolemic today - weighing daily; reminded to call for an overnight weight gain of > 2 pounds or a weekly  weight gain of > 5 pounds - weight down 6 pounds from last visit here 6 weeks ago - does not add salt to his food; reviewed the importance of reading food labels for sodium content - on GDMT of entresto & farxiga although has been out of farxiga for the last week - 2 weeks samples of farxiga provided today - will check BMP today as farxiga started at last visit - BNP 03/04/22 was 713.0  2: HTN with CKD- - BP elevated (145/107) but he admits that he's frustrated/ irritated with this disability process and his lack of income and having to rely on his mom for assistance - saw PCP Thereasa Solo) 03/27/22; returns in 3 months - BMP 03/06/22 reviewed and showed sodium 139, potassium 4.0, creatinine 1.63 and GFR 52 - rechecking BMP today  3: Tobacco use- - smoking 2 cigarettes daily - complete cessation discussed for 3 minutes with him  4: Cocaine abuse- - says that he occasionally uses cocaine when his back pain is so severe that he can't stand it - understands that this isn't the solution but he says when his back pain is that bad, he can't even stand up straight  5: Atrial fibrillation- - saw cardiology (End) 03/20/22; returns 06/26/22 - taking apixaban   Medication bottles reviewed.   Return in 4 months, sooner if needed.

## 2022-05-26 ENCOUNTER — Other Ambulatory Visit: Payer: Self-pay

## 2022-06-16 ENCOUNTER — Other Ambulatory Visit: Payer: Self-pay

## 2022-06-17 ENCOUNTER — Other Ambulatory Visit: Payer: Self-pay

## 2022-06-26 ENCOUNTER — Encounter: Payer: Self-pay | Admitting: Internal Medicine

## 2022-06-26 ENCOUNTER — Ambulatory Visit: Payer: Self-pay | Attending: Internal Medicine | Admitting: Internal Medicine

## 2022-06-26 NOTE — Progress Notes (Deleted)
   Follow-up Outpatient Visit Date: 06/26/2022  Primary Care Provider: Ladell Pier, MD [address] [place] [address]  Chief Complaint: ***  HPI:  Mr. Godman is a 46 y.o. male with history of chronic HFpEF, hypertension, asthma, and polysubstance abuse , who presents for follow-up of atrial fibrillation and HFpEF.  I last saw him in late July following hospitalization for chest pain in the setting of HFpEF and atrial fibrillation.  At our follow-up visit, Mr. Slabach reported a few episodes of palpitations that seem to be associated with dietary indiscretion, particularly when taking an excess sodium.  He continued to smoke tobacco and marijuana as well as use cocaine.  We did not make any medication changes or pursue additional testing.  --------------------------------------------------------------------------------------------------  Past Medical History:  Diagnosis Date   Arrhythmia    atrial fibrillation   Asthma    CHF (congestive heart failure) (HCC)    CKD (chronic kidney disease), stage III ([place])    Cocaine abuse ([place])    Diverticulitis    Hypertension    Tobacco use disorder    Past Surgical History:  Procedure Laterality Date   ANKLE SURGERY      No outpatient medications have been marked as taking for the 06/26/22 encounter (Appointment) with Cilicia Borden, Harrell Gave, MD.    Allergies: Patient has no known allergies.  Social History   Tobacco Use   Smoking status: Some Days    Packs/day: 1.00    Types: Cigarettes   Smokeless tobacco: Never   Tobacco comments:    2-3 cigarettes   Vaping Use   Vaping Use: Never used  Substance Use Topics   Alcohol use: Not Currently    Comment: occas   Drug use: Yes    Types: Marijuana, Cocaine    Comment: marijuana daily, cocaine only on really painful days    Family History  Problem Relation Age of Onset   Hypertension Mother     Review of Systems: A 12-system review of systems was performed  and was negative except as noted in the HPI.  --------------------------------------------------------------------------------------------------  Physical Exam: There were no vitals taken for this visit.  General:  NAD. Neck: No JVD or HJR. Lungs: Clear to auscultation bilaterally without wheezes or crackles. Heart: Regular rate and rhythm without murmurs, rubs, or gallops. Abdomen: Soft, nontender, nondistended. Extremities: No lower extremity edema.  EKG:  ***  Lab Results  Component Value Date   WBC 7.5 03/06/2022   HGB 14.4 03/06/2022   HCT 45.1 03/06/2022   MCV 81.0 03/06/2022   PLT 279 03/06/2022    Lab Results  Component Value Date   NA 140 05/13/2022   K 4.3 05/13/2022   CL 107 05/13/2022   CO2 25 05/13/2022   BUN 31 (H) 05/13/2022   CREATININE 1.77 (H) 05/13/2022   GLUCOSE 97 05/13/2022   ALT 12 03/04/2022    Lab Results  Component Value Date   CHOL 239 (H) 01/31/2022   HDL 52 01/31/2022   LDLCALC 169 (H) 01/31/2022   TRIG 103 01/31/2022   CHOLHDL 4.6 01/31/2022    --------------------------------------------------------------------------------------------------  ASSESSMENT AND PLAN: Harrell Gave Avantika Shere, MD 06/26/2022 7:19 AM

## 2022-07-07 ENCOUNTER — Other Ambulatory Visit: Payer: Self-pay

## 2022-07-08 ENCOUNTER — Other Ambulatory Visit: Payer: Self-pay

## 2022-07-15 ENCOUNTER — Other Ambulatory Visit: Payer: Self-pay | Admitting: Internal Medicine

## 2022-07-15 ENCOUNTER — Other Ambulatory Visit: Payer: Self-pay

## 2022-07-15 MED FILL — Amlodipine Besylate Tab 10 MG (Base Equivalent): ORAL | 18 days supply | Qty: 18 | Fill #0 | Status: AC

## 2022-07-15 MED FILL — Amlodipine Besylate Tab 10 MG (Base Equivalent): ORAL | 12 days supply | Qty: 12 | Fill #0 | Status: AC

## 2022-07-15 NOTE — Telephone Encounter (Signed)
Good Morning,  Could you schedule this patient a 3 month follow up appointment? The patient last saw Dr. Okey Dupre on 03-20-22. Thank you so much.

## 2022-08-01 ENCOUNTER — Other Ambulatory Visit: Payer: Self-pay

## 2022-08-07 NOTE — Progress Notes (Deleted)
Cardiology Clinic Note   Patient Name: Brad [name]. Date of Encounter: 08/07/2022  Primary Care Provider:  Marcine Matar, MD Primary Cardiologist:  Yvonne Kendall, MD  Patient Profile    46 year old male with a history of chronic HFpEF, hypertension, asthma, polysubstance abuse, paroxysmal atrial fibrillation ,who presents today for follow-up.  Past Medical History    Past Medical History:  Diagnosis Date   Arrhythmia    atrial fibrillation   Asthma    CHF (congestive heart failure) (HCC)    CKD (chronic kidney disease), stage III (HCC)    Cocaine abuse (HCC)    Diverticulitis    Hypertension    Tobacco use disorder    Past Surgical History:  Procedure Laterality Date   ANKLE SURGERY      Allergies  No Known Allergies  History of Present Illness    Brad [name]. is a 46 year old male with a previously mentioned past medical history of chronic HFpEF, hypertension, asthma, polysubstance abuse, and recent new onset paroxysmal atrial fibrillation started on chronic anticoagulation.  He had presented to the [facility] emergency department on 03/04/2022 with chest pain and recent cocaine and marijuana use 2 days prior to presentation.  He was found to be in atrial fibrillation with RVR and underwent cardioversion as medical therapy but was unsuccessful.  He required admission to the ICU post DCCV due to hypotension.  He had chest pain following the cardioversion as well.  Echocardiogram revealed LVEF with severe LVH, restrictive diastolic parameters, and elevated filling pressures, moderate biatrial enlargement was noted as well.  High-sensitivity troponins peaked at 2103 which was felt to be demand ischemia as subsequent MPI showed no ischemia, scar, or coronary artery calcification.  He was discharged on apixaban, carvedilol, hydralazine, isosorbide furosemide, and Entresto.  He was last seen in clinic on 03/20/2022 where he reported that he was feeling well.   He had experienced brief episode of flutters but he denied any chest pain, shortness of breath, lightheadedness, or edema.  He had continued to use tobacco, marijuana, and cocaine though he stated he was trying to cut down.  There were no new medications or testing ordered.  He was encouraged to continue with his follow-up appointments with heart failure clinic with Tomasita Morrow.  He returns to clinic today  Home Medications    Current Outpatient Medications  Medication Sig Dispense Refill   amLODipine (NORVASC) 10 MG tablet Take 1 tablet (10 mg total) by mouth daily. PLEASE KEEP APPOINTMENT FOR FURTHER REFILLS. THANK YOU! 30 tablet 0   apixaban (ELIQUIS) 5 MG TABS tablet Take 1 tablet (5 mg total) by mouth 2 (two) times daily. (Patient not taking: Reported on 05/13/2022) 60 tablet 2   apixaban (ELIQUIS) 5 MG TABS tablet Take one tablet by mouth twice a day 180 tablet 3   aspirin EC 81 MG EC tablet Take 1 tablet (81 mg total) by mouth daily. Swallow whole. 30 tablet 11   atorvastatin (LIPITOR) 80 MG tablet Take 1 tablet (80 mg total) by mouth daily. 30 tablet 3   carvedilol (COREG) 6.25 MG tablet Take 1 tablet (6.25 mg total) by mouth 2 (two) times daily with a meal. 60 tablet 3   dapagliflozin propanediol (FARXIGA) 10 MG TABS tablet Take 1 tablet (10 mg total) by mouth daily before breakfast. (Patient not taking: Reported on 05/13/2022) 90 tablet 3   furosemide (LASIX) 40 MG tablet Take 1 tablet (40 mg total) by mouth once daily.  90 tablet 3   hydrALAZINE (APRESOLINE) 100 MG tablet Take 1 tablet (100 mg total) by mouth 3 (three) times daily. 270 tablet 3   isosorbide mononitrate (IMDUR) 30 MG 24 hr tablet Take 1 tablet (30 mg total) by mouth once daily. 90 tablet 3   sacubitril-valsartan (ENTRESTO) 97-103 MG Take 1 tablet by mouth 2 (two) times daily. (Patient not taking: Reported on 05/13/2022) 180 tablet 3   sacubitril-valsartan (ENTRESTO) 97-103 MG Take one tablet by mouth twice a day 180  tablet 3   No current facility-administered medications for this visit.     Family History    Family History  Problem Relation Age of Onset   Hypertension Mother    He indicated that his mother is alive. He indicated that his father is deceased.  Social History    Social History   Socioeconomic History   Marital status: Single    Spouse name: Not on file   Number of children: Not on file   Years of education: Not on file   Highest education level: Not on file  Occupational History   Not on file  Tobacco Use   Smoking status: Some Days    Packs/day: 1.00    Types: Cigarettes   Smokeless tobacco: Never   Tobacco comments:    2-3 cigarettes   Vaping Use   Vaping Use: Never used  Substance and Sexual Activity   Alcohol use: Not Currently    Comment: occas   Drug use: Yes    Types: Marijuana, Cocaine    Comment: marijuana daily, cocaine only on really painful days   Sexual activity: Yes  Other Topics Concern   Not on file  Social History Narrative   Not on file   Social Determinants of Health   Financial Resource Strain: Not on file  Food Insecurity: Not on file  Transportation Needs: Not on file  Physical Activity: Not on file  Stress: Not on file  Social Connections: Not on file  Intimate Partner Violence: Not on file     Review of Systems    General:  No chills, fever, night sweats or weight changes.  Cardiovascular:  No chest pain, dyspnea on exertion, edema, orthopnea, palpitations, paroxysmal nocturnal dyspnea. Dermatological: No rash, lesions/masses Respiratory: No cough, dyspnea Urologic: No hematuria, dysuria Abdominal:   No nausea, vomiting, diarrhea, bright red blood per rectum, melena, or hematemesis Neurologic:  No visual changes, wkns, changes in mental status. All other systems reviewed and are otherwise negative except as noted above.     Physical Exam    VS:  There were no vitals taken for this visit. , BMI There is no height or  weight on file to calculate BMI.     GEN: Well nourished, well developed, in no acute distress. HEENT: normal. Neck: Supple, no JVD, carotid bruits, or masses. Cardiac: RRR, no murmurs, rubs, or gallops. No clubbing, cyanosis, edema.  Radials 2+/PT 2+ and equal bilaterally.  Respiratory:  Respirations regular and unlabored, clear to auscultation bilaterally. GI: Soft, nontender, nondistended, BS + x 4. MS: no deformity or atrophy. Skin: warm and dry, no rash. Neuro:  Strength and sensation are intact. Psych: Normal affect.  Accessory Clinical Findings    ECG personally reviewed by me today- *** - No acute changes  Lab Results  Component Value Date   WBC 7.5 03/06/2022   HGB 14.4 03/06/2022   HCT 45.1 03/06/2022   MCV 81.0 03/06/2022   PLT 279 03/06/2022   Lab  Results  Component Value Date   CREATININE 1.77 (H) 05/13/2022   BUN 31 (H) 05/13/2022   NA 140 05/13/2022   K 4.3 05/13/2022   CL 107 05/13/2022   CO2 25 05/13/2022   Lab Results  Component Value Date   ALT 12 03/04/2022   AST 24 03/04/2022   ALKPHOS 56 03/04/2022   BILITOT 1.4 (H) 03/04/2022   Lab Results  Component Value Date   CHOL 239 (H) 01/31/2022   HDL 52 01/31/2022   LDLCALC 169 (H) 01/31/2022   TRIG 103 01/31/2022   CHOLHDL 4.6 01/31/2022    Lab Results  Component Value Date   HGBA1C 6.2 (H) 03/04/2022    Assessment & Plan   1.  ***  Alysse Rathe, NP 08/07/2022, 9:42 AM

## 2022-08-08 ENCOUNTER — Other Ambulatory Visit: Payer: Self-pay

## 2022-08-08 ENCOUNTER — Ambulatory Visit: Payer: Self-pay | Attending: Cardiology | Admitting: Cardiology

## 2022-08-08 ENCOUNTER — Other Ambulatory Visit: Payer: Self-pay | Admitting: Pharmacy Technician

## 2022-08-08 NOTE — Progress Notes (Signed)
Contacted [employer] to find out why Brad [name] has not been shipped.  Spoke with Brad [name] from [employer].  She indicated that [employer] had reached out to Brad [name] a couple of times to verify household size.  She spoke with Brad [name], Brad [name] who indicated that Brad [name] was not home both times they called.  Left her phone number for Brad [name] to return call.  Brad [name] did not return phone call.  I attempted to contact Brad [name].  Unable to reach.  Left message for Brad [name] to contact me.  Brad [name] Brad [name] Advocate Specialist [facility] at [facility]

## 2022-08-11 ENCOUNTER — Encounter: Payer: Self-pay | Admitting: Cardiology

## 2022-08-22 ENCOUNTER — Other Ambulatory Visit: Payer: Self-pay | Admitting: Internal Medicine

## 2022-08-22 ENCOUNTER — Other Ambulatory Visit: Payer: Self-pay

## 2022-08-26 ENCOUNTER — Other Ambulatory Visit: Payer: Self-pay

## 2022-08-26 MED FILL — Amlodipine Besylate Tab 10 MG (Base Equivalent): ORAL | 30 days supply | Qty: 30 | Fill #0 | Status: AC

## 2022-09-10 NOTE — Progress Notes (Signed)
Patient ID: Brad [name]., male    DOB: [birth year], 47 y.o.   MRN: [id number]   Brad [name] is a 47 y/o male with a history of HTN, tobacco use, substance abuse, rapid atrial fibrillation and chronic heart failure.   Echo report from 03/04/22 reviewed and showed an EF of 60-65% along with severe LVH. Echo report from 01/13/21 reviewed and showed an EF of 60-65% along with severe LVH.   Has not been admitted or been in the ED in the last 6 months.   He presents today for a follow-up visit with a chief complaint of minimal fatigue with moderate exertion. He describes this as chronic in nature. He has associated cough, shortness of breath, wheezing, head congestion, difficulty sleeping and chronic pain along with this. He denies any abdominal distention, palpitations, pedal edema, chest pain, dizziness or weight gain.   He uses a pill box at home. Doesn't have his furosemide bottle and says that if he doesn't have the bottle with him than he must have run out. Also says that he's taking his hydralazine BID instead of TID because he just forgets to take the 3rd dose.   Past Medical History:  Diagnosis Date   Arrhythmia    atrial fibrillation   Asthma    CHF (congestive heart failure) (HCC)    CKD (chronic kidney disease), stage III (HCC)    Cocaine abuse ([place])    Diverticulitis    Hypertension    Tobacco use disorder    Past Surgical History:  Procedure Laterality Date   ANKLE SURGERY     Family History  Problem Relation Age of Onset   Hypertension Mother    Social History   Tobacco Use   Smoking status: Some Days    Packs/day: 1.00    Types: Cigarettes   Smokeless tobacco: Never   Tobacco comments:    2-3 cigarettes   Substance Use Topics   Alcohol use: Not Currently    Comment: occas   No Known Allergies  Prior to Admission medications   Medication Sig Start Date End Date Taking? Authorizing Provider  amLODipine (NORVASC) 10 MG tablet Take 1 tablet (10 mg total)  by mouth daily. PLEASE KEEP APPOINTMENT FOR FURTHER REFILLS. THANK YOU! 08/26/22  Yes End, Harrell Gave, MD  apixaban (ELIQUIS) 5 MG TABS tablet Take one tablet by mouth twice a day 04/11/22  Yes   aspirin EC 81 MG EC tablet Take 1 tablet (81 mg total) by mouth daily. Swallow whole. 01/14/21  Yes Lorella Nimrod, MD  atorvastatin (LIPITOR) 80 MG tablet Take 1 tablet (80 mg total) by mouth daily. 03/27/22  Yes Freeman Caldron M, PA-C  carvedilol (COREG) 6.25 MG tablet Take 1 tablet (6.25 mg total) by mouth 2 (two) times daily with a meal. 03/27/22  Yes McClung, Angela M, PA-C  dapagliflozin propanediol (FARXIGA) 10 MG TABS tablet Take 1 tablet (10 mg total) by mouth daily before breakfast. 03/25/22  Yes Darylene Price A, FNP  hydrALAZINE (APRESOLINE) 100 MG tablet Take 1 tablet (100 mg total) by mouth 3 (three) times daily. Patient taking differently: Take 100 mg by mouth 2 (two) times daily. 12/20/21  Yes Darylene Price A, FNP  isosorbide mononitrate (IMDUR) 30 MG 24 hr tablet Take 1 tablet (30 mg total) by mouth once daily. 01/21/22  Yes Estee Yohe A, FNP  sacubitril-valsartan (ENTRESTO) 97-103 MG Take 1 tablet by mouth 2 (two) times daily. 12/30/21  Yes Alisa Graff, FNP  furosemide (LASIX)  40 MG tablet Take 1 tablet (40 mg total) by mouth once daily. Patient not taking: Reported on 09/11/2022 12/20/21   Delma Freeze, FNP    Review of Systems  Constitutional:  Positive for fatigue. Negative for appetite change.  HENT:  Positive for postnasal drip. Negative for congestion and sore throat.   Eyes: Negative.   Respiratory:  Positive for cough, shortness of breath and wheezing. Negative for apnea and chest tightness.        + snoring  Cardiovascular:  Negative for chest pain, palpitations and leg swelling.  Gastrointestinal:  Negative for abdominal distention and abdominal pain.  Endocrine: Negative.   Genitourinary: Negative.   Musculoskeletal:  Positive for arthralgias (intermittent left leg/ foot) and  back pain (radiates down left leg). Negative for neck pain.  Skin: Negative.   Allergic/Immunologic: Negative.   Neurological:  Negative for dizziness and light-headedness.  Hematological:  Negative for adenopathy. Does not bruise/bleed easily.  Psychiatric/Behavioral:  Positive for sleep disturbance (trouble sleeping due to bed). Negative for dysphoric mood. The patient is not nervous/anxious.    Vitals:   09/11/22 1332  BP: (!) 149/113  Pulse: 90  Resp: 20  SpO2: 98%  Weight: 264 lb (119.7 kg)   Wt Readings from Last 3 Encounters:  09/11/22 264 lb (119.7 kg)  05/13/22 272 lb 8 oz (123.6 kg)  03/27/22 277 lb 6.4 oz (125.8 kg)   Lab Results  Component Value Date   CREATININE 1.77 (H) 05/13/2022   CREATININE 1.63 (H) 03/06/2022   CREATININE 1.88 (H) 03/05/2022   Physical Exam Vitals and nursing note reviewed.  Constitutional:      General: He is not in acute distress.    Appearance: Normal appearance.  HENT:     Head: Normocephalic and atraumatic.  Cardiovascular:     Rate and Rhythm: Normal rate. Rhythm irregular.  Pulmonary:     Effort: Pulmonary effort is normal. No respiratory distress.     Breath sounds: No wheezing or rales.  Abdominal:     General: There is no distension.     Palpations: Abdomen is soft.     Tenderness: There is no abdominal tenderness.  Musculoskeletal:        General: No tenderness.     Cervical back: Normal range of motion and neck supple.     Right lower leg: No edema.     Left lower leg: No edema.  Skin:    General: Skin is warm and dry.  Neurological:     General: No focal deficit present.     Mental Status: He is alert and oriented to person, place, and time.     Motor: No weakness.     Gait: Gait normal.  Psychiatric:        Mood and Affect: Mood normal.        Behavior: Behavior normal.        Thought Content: Thought content normal.     Assessment & Plan:  1: Chronic heart failure with preserved ejection fraction with  structural changes (LVH)- - NYHA class II - euvolemic today - weighing daily; reminded to call for an overnight weight gain of > 2 pounds or a weekly weight gain of > 5 pounds - weight down 8 pounds from last visit here 4 months ago - does not add salt to his food; reviewed the importance of reading food labels for sodium content - on GDMT of entresto & farxiga  - BNP 03/04/22 was 713.0  2:  HTN with CKD- - BP 149/113; has been out of furosemide for unknown duration (refilled today) & has been taking hydralazine BID instead of TID due to forgetting the 3rd dose often - saw PCP Thereasa Solo) 03/27/22 - BMP 05/13/22 reviewed and showed sodium 140, potassium 4.3, creatinine 1.77 and GFR 47  3: Tobacco use- - smoking 2 cigarettes daily - complete cessation discussed for 3 minutes with him  4: Cocaine abuse- - says that he occasionally uses cocaine when his back pain is so severe that he can't stand it - says that he's using less often and tends to be on the weekends - does endorse marijuana use  5: Atrial fibrillation- - saw cardiology (End) 03/20/22; f/u has been scheduled for 09/16/22 - taking apixaban   Medication bottles reviewed.   Return in 6 weeks, sooner if needed. With resumption of furosemide, will do BMP at next visit.

## 2022-09-11 ENCOUNTER — Encounter: Payer: Self-pay | Admitting: Family

## 2022-09-11 ENCOUNTER — Other Ambulatory Visit: Payer: Self-pay

## 2022-09-11 ENCOUNTER — Ambulatory Visit: Payer: Self-pay | Attending: Family | Admitting: Family

## 2022-09-11 VITALS — BP 149/113 | HR 90 | Resp 20 | Wt 264.0 lb

## 2022-09-11 DIAGNOSIS — Z7901 Long term (current) use of anticoagulants: Secondary | ICD-10-CM | POA: Insufficient documentation

## 2022-09-11 DIAGNOSIS — R062 Wheezing: Secondary | ICD-10-CM | POA: Insufficient documentation

## 2022-09-11 DIAGNOSIS — I4891 Unspecified atrial fibrillation: Secondary | ICD-10-CM | POA: Insufficient documentation

## 2022-09-11 DIAGNOSIS — R0602 Shortness of breath: Secondary | ICD-10-CM | POA: Insufficient documentation

## 2022-09-11 DIAGNOSIS — R0981 Nasal congestion: Secondary | ICD-10-CM | POA: Insufficient documentation

## 2022-09-11 DIAGNOSIS — F1410 Cocaine abuse, uncomplicated: Secondary | ICD-10-CM | POA: Insufficient documentation

## 2022-09-11 DIAGNOSIS — I5032 Chronic diastolic (congestive) heart failure: Secondary | ICD-10-CM | POA: Insufficient documentation

## 2022-09-11 DIAGNOSIS — M549 Dorsalgia, unspecified: Secondary | ICD-10-CM | POA: Insufficient documentation

## 2022-09-11 DIAGNOSIS — Z720 Tobacco use: Secondary | ICD-10-CM

## 2022-09-11 DIAGNOSIS — F17210 Nicotine dependence, cigarettes, uncomplicated: Secondary | ICD-10-CM | POA: Insufficient documentation

## 2022-09-11 DIAGNOSIS — I130 Hypertensive heart and chronic kidney disease with heart failure and stage 1 through stage 4 chronic kidney disease, or unspecified chronic kidney disease: Secondary | ICD-10-CM | POA: Insufficient documentation

## 2022-09-11 DIAGNOSIS — Z7984 Long term (current) use of oral hypoglycemic drugs: Secondary | ICD-10-CM | POA: Insufficient documentation

## 2022-09-11 DIAGNOSIS — Z79899 Other long term (current) drug therapy: Secondary | ICD-10-CM | POA: Insufficient documentation

## 2022-09-11 DIAGNOSIS — G8929 Other chronic pain: Secondary | ICD-10-CM | POA: Insufficient documentation

## 2022-09-11 DIAGNOSIS — I10 Essential (primary) hypertension: Secondary | ICD-10-CM

## 2022-09-11 DIAGNOSIS — I480 Paroxysmal atrial fibrillation: Secondary | ICD-10-CM

## 2022-09-11 DIAGNOSIS — N183 Chronic kidney disease, stage 3 unspecified: Secondary | ICD-10-CM | POA: Insufficient documentation

## 2022-09-11 MED ORDER — ATORVASTATIN CALCIUM 80 MG PO TABS
80.0000 mg | ORAL_TABLET | Freq: Every day | ORAL | 3 refills | Status: DC
  Filled 2022-09-11 – 2022-12-30 (×2): qty 90, 90d supply, fill #0
  Filled 2023-05-05: qty 90, 90d supply, fill #1
  Filled 2023-08-04: qty 90, 90d supply, fill #2

## 2022-09-11 MED ORDER — AMLODIPINE BESYLATE 10 MG PO TABS
10.0000 mg | ORAL_TABLET | Freq: Every day | ORAL | 3 refills | Status: DC
Start: 2022-09-11 — End: 2023-01-15
  Filled 2022-09-11: qty 90, 90d supply, fill #0

## 2022-09-11 MED ORDER — FUROSEMIDE 40 MG PO TABS
40.0000 mg | ORAL_TABLET | Freq: Every day | ORAL | 3 refills | Status: DC
  Filled 2022-09-11: qty 90, 90d supply, fill #0

## 2022-09-11 NOTE — Patient Instructions (Addendum)
Continue weighing daily and call for an overnight weight gain of 3 pounds or more or a weekly weight gain of more than 5 pounds.   If you have voicemail, please make sure your mailbox is cleaned out so that we may leave a message and please make sure to listen to any voicemails.    Go to the pharmacy and pick up your 3 medications that I sent in.

## 2022-09-12 ENCOUNTER — Other Ambulatory Visit: Payer: Self-pay

## 2022-09-16 ENCOUNTER — Ambulatory Visit: Payer: Self-pay | Attending: Cardiology | Admitting: Cardiology

## 2022-09-16 ENCOUNTER — Encounter: Payer: Self-pay | Admitting: Cardiology

## 2022-09-16 VITALS — BP 154/92 | HR 82 | Ht 66.0 in | Wt 263.8 lb

## 2022-09-16 DIAGNOSIS — I5032 Chronic diastolic (congestive) heart failure: Secondary | ICD-10-CM

## 2022-09-16 DIAGNOSIS — F1910 Other psychoactive substance abuse, uncomplicated: Secondary | ICD-10-CM

## 2022-09-16 DIAGNOSIS — I10 Essential (primary) hypertension: Secondary | ICD-10-CM

## 2022-09-16 DIAGNOSIS — I480 Paroxysmal atrial fibrillation: Secondary | ICD-10-CM

## 2022-09-16 NOTE — Progress Notes (Signed)
Cardiology Clinic Note   Patient Name: Brad [name]. Date of Encounter: 09/16/2022  Primary Care Provider:  Ladell Pier, MD Primary Cardiologist:  Nelva Bush, MD  Patient Profile    47 year old male with a history of chronic HFpEF, hypertension, asthma, polysubstance abuse, paroxysmal atrial fibrillation, who presents today for follow-up of his hypertension and paroxysmal atrial fibrillation.  Past Medical History    Past Medical History:  Diagnosis Date   Arrhythmia    atrial fibrillation   Asthma    CHF (congestive heart failure) (HCC)    CKD (chronic kidney disease), stage III (HCC)    Cocaine abuse ([place])    Diverticulitis    Hypertension    Tobacco use disorder    Past Surgical History:  Procedure Laterality Date   ANKLE SURGERY      Allergies  No Known Allergies  History of Present Illness    Brad [name]. is a 47 year old male with previously mentioned past medical history of chronic HFpEF, hypertension, asthma, polysubstance abuse, and recent onset paroxysmal atrial fibrillation started on chronic anticoagulation.  He presented to the [facility] emergency department 02/22/2022 with chest pain and recent cocaine and marijuana use 2 days prior to presentation.  He was found to be in new onset atrial fibrillation with RVR and underwent cardioversion as medical therapy but was unsuccessful.  He required admission to the ICU post DCCV due to hypotension.  He had chest pain following the cardioversion as well.  Echocardiogram revealed LVEF with severe LVH restrictive diastolic parameters, and elevated filling pressures, moderate biatrial enlargement was noted.  High-sensitivity troponins peaked at 2023 which was felt to be demand ischemia as subsequent MPI showed no ischemia, scar and coronary artery calcification.  He was discharged on apixaban, carvedilol, hydralazine, isosorbide, furosemide, and Entresto.  He was last seen in clinic 03/13/2022 was  40 was feeling well.  He had experienced brief episodes of flutters.  Denies any chest discomfort, shortness of breath, lightheadedness, or edema.  He continues tobacco, marijuana and cocaine, though he states he has been trying to cut down.  There were no new medications or testing ordered.  He was encouraged to continue with his follow-up appointments failure clinic with Darylene Price, NP.  He returns to clinic today stating that he has been doing fine. No complaints of chest pain, shortness of breath, peripheral edema, or palpitations. He states that he has been taking all of his medications as prescribed and he requires no refills today. He states that he has continued to follow-up with advanced heart failure with Darylene Price, NP. Denies any recent hospitalizations or visits to the emergency department.   Home Medications    Current Outpatient Medications  Medication Sig Dispense Refill   amLODipine (NORVASC) 10 MG tablet Take 1 tablet (10 mg total) by mouth daily. 90 tablet 3   apixaban (ELIQUIS) 5 MG TABS tablet Take one tablet by mouth twice a day 180 tablet 3   aspirin EC 81 MG EC tablet Take 1 tablet (81 mg total) by mouth daily. Swallow whole. 30 tablet 11   atorvastatin (LIPITOR) 80 MG tablet Take 1 tablet (80 mg total) by mouth daily. 90 tablet 3   carvedilol (COREG) 6.25 MG tablet Take 1 tablet (6.25 mg total) by mouth 2 (two) times daily with a meal. 60 tablet 3   dapagliflozin propanediol (FARXIGA) 10 MG TABS tablet Take 1 tablet (10 mg total) by mouth daily before breakfast. 90 tablet 3  furosemide (LASIX) 40 MG tablet Take 1 tablet (40 mg total) by mouth once daily. 90 tablet 3   hydrALAZINE (APRESOLINE) 100 MG tablet Take 1 tablet (100 mg total) by mouth 3 (three) times daily. (Patient taking differently: Take 100 mg by mouth 2 (two) times daily.) 270 tablet 3   isosorbide mononitrate (IMDUR) 30 MG 24 hr tablet Take 1 tablet (30 mg total) by mouth once daily. 90 tablet 3    sacubitril-valsartan (ENTRESTO) 97-103 MG Take 1 tablet by mouth 2 (two) times daily. 180 tablet 3   No current facility-administered medications for this visit.     Family History    Family History  Problem Relation Age of Onset   Hypertension Mother    He indicated that his mother is alive. He indicated that his father is deceased.  Social History    Social History   Socioeconomic History   Marital status: Single    Spouse name: Not on file   Number of children: Not on file   Years of education: Not on file   Highest education level: Not on file  Occupational History   Not on file  Tobacco Use   Smoking status: Some Days    Packs/day: 1.00    Types: Cigarettes   Smokeless tobacco: Never   Tobacco comments:    2-3 cigarettes   Vaping Use   Vaping Use: Never used  Substance and Sexual Activity   Alcohol use: Not Currently    Comment: occas   Drug use: Yes    Types: Marijuana, Cocaine    Comment: marijuana daily, cocaine only on really painful days   Sexual activity: Yes  Other Topics Concern   Not on file  Social History Narrative   Not on file   Social Determinants of Health   Financial Resource Strain: Not on file  Food Insecurity: Not on file  Transportation Needs: Not on file  Physical Activity: Not on file  Stress: Not on file  Social Connections: Not on file  Intimate Partner Violence: Not on file     Review of Systems    General:  No chills, fever, night sweats or weight changes.  Cardiovascular:  No chest pain, dyspnea on exertion, edema, orthopnea, palpitations, paroxysmal nocturnal dyspnea. Dermatological: No rash, lesions/masses Respiratory: No cough, dyspnea Urologic: No hematuria, dysuria Abdominal:   No nausea, vomiting, diarrhea, bright red blood per rectum, melena, or hematemesis Neurologic:  No visual changes, wkns, changes in mental status. All other systems reviewed and are otherwise negative except as noted above.   Physical Exam     VS:  BP (!) 154/92 (BP Location: Left Arm, Patient Position: Sitting, Cuff Size: Large)   Pulse 82   Ht 5\' 6"  (1.676 m)   Wt 263 lb 12.8 oz (119.7 kg)   SpO2 96%   BMI 42.58 kg/m  , BMI Body mass index is 42.58 kg/m.     Vitals:   09/16/22 0225 09/16/22 1456  BP: (!) 148/88 (!) 154/92   GEN: Well nourished, well developed, in no acute distress. HEENT: normal. Neck: Supple, no JVD, carotid bruits, or masses. Cardiac: RRR, no murmurs, rubs, or gallops. No clubbing, cyanosis, edema.  Radials 2+/PT 2+ and equal bilaterally.  Respiratory:  Respirations regular and unlabored, clear to auscultation bilaterally. GI: Soft, nontender, nondistended, BS + x 4. MS: no deformity or atrophy. Skin: warm and dry, no rash. Neuro:  Strength and sensation are intact. Psych: Normal affect.  Accessory Clinical Findings  ECG personally reviewed by me today- sinus rate of 82, first degree AVB, LVH, st depression an TWI in lateral leads - No acute changes  Lab Results  Component Value Date   WBC 7.5 03/06/2022   HGB 14.4 03/06/2022   HCT 45.1 03/06/2022   MCV 81.0 03/06/2022   PLT 279 03/06/2022   Lab Results  Component Value Date   CREATININE 1.77 (H) 05/13/2022   BUN 31 (H) 05/13/2022   NA 140 05/13/2022   K 4.3 05/13/2022   CL 107 05/13/2022   CO2 25 05/13/2022   Lab Results  Component Value Date   ALT 12 03/04/2022   AST 24 03/04/2022   ALKPHOS 56 03/04/2022   BILITOT 1.4 (H) 03/04/2022   Lab Results  Component Value Date   CHOL 239 (H) 01/31/2022   HDL 52 01/31/2022   LDLCALC 169 (H) 01/31/2022   TRIG 103 01/31/2022   CHOLHDL 4.6 01/31/2022    Lab Results  Component Value Date   HGBA1C 6.2 (H) 03/04/2022    Assessment & Plan   1.  Paroxysmal atrial fibrillation without complaints of palpitations or recurrent atrial fibrillation noted.  Sinus tach noted on EKG today with first-degree AV block and LVH with ST depression and T wave inversion in lateral leads.  He is  continued on apixaban 5 mg twice daily for CHA2DS2-VASc score of at least 2 and low-dose carvedilol 6.25 mg twice daily for rate control.  2.  Chronic HFpEF where he denies any shortness of breath, weight gain, peripheral edema.  He continues to follow with Clarisa Kindred, NP advanced heart failure clinic.  He is continued on carvedilol, Farxiga, furosemide, and Entresto. NYHA class I  3.  Hypertension with blood pressure 154/92 recheck was 148/88 which is slowly improving he is continued on amlodipine 10 mg daily, carvedilol 6.25 mg twice daily, Farxiga 10 mg daily, furosemide 40 mg daily, hydralazine 100 mg 3 times a day, Imdur 30 mg daily and Entresto 97/103 twice daily.  Previously patient stated he was only taken his abdominal pain and had 2 times daily versus 3 times and had ran out of his furosemide which she states he has all his medications today.  4.  Polysubstance abuse where he continues with cigarettes, endorses continued marijuana use, and continues to endorse occasionally using cocaine when his back pain is so severe that he cannot stand it any longer.  5.  Disposition patient return to clinic to see MD/APP in 6 months or sooner if needed.  Vidya Bamford, NP 09/16/2022, 3:36 PM

## 2022-09-16 NOTE — Patient Instructions (Signed)
Medication Instructions:  Your Physician recommend you continue on your current medication as directed.    *If you need a refill on your cardiac medications before your next appointment, please call your pharmacy*   Lab Work: None ordered today   Testing/Procedures: None ordered today   Follow-Up: At [facility], you and your health needs are our priority.  As part of our continuing mission to provide you with exceptional heart care, we have created designated Provider Care Teams.  These Care Teams include your primary Cardiologist (physician) and Advanced Practice Providers (APPs -  Physician Assistants and Nurse Practitioners) who all work together to provide you with the care you need, when you need it.  We recommend signing up for the patient portal called "MyChart".  Sign up information is provided on this After Visit Summary.  MyChart is used to connect with patients for Virtual Visits (Telemedicine).  Patients are able to view lab/test results, encounter notes, upcoming appointments, etc.  Non-urgent messages can be sent to your provider as well.   To learn more about what you can do with MyChart, go to [email or web address].    Your next appointment:   6 month(s)  Provider:   You may see Christopher End, MD or one of the following Advanced Practice Providers on your designated Care Team:   Christopher Berge, NP Ryan Dunn, PA-C Cadence Furth, PA-C Sheri Hammock, NP     

## 2022-10-01 ENCOUNTER — Other Ambulatory Visit: Payer: Self-pay

## 2022-10-09 ENCOUNTER — Telehealth: Payer: Self-pay

## 2022-10-09 NOTE — Telephone Encounter (Signed)
Patient attempted to be outreached by Junius Finner, PharmD Candidate on 10/09/2022 to discuss hypertension. Mother answered phone and patient was unavailable, mother took down phone number [phone]) and said she would give patient the message to call back.   [place] [facility]  PharmD Candidate 2024   Maryan Puls, PharmD PGY-1 [facility] Pharmacy Resident

## 2022-10-14 ENCOUNTER — Other Ambulatory Visit: Payer: Self-pay

## 2022-10-16 NOTE — Telephone Encounter (Signed)
Patient attempted to be outreached by Junius Finner, PharmD Candidate on 10/16/2022 to discuss hypertension. Left message for patient with mother to return our call at their convenience at [phone].   [place] [facility]  PharmD Candidate 2024   Maryan Puls, PharmD PGY-1 [facility] Pharmacy Resident

## 2022-10-21 ENCOUNTER — Telehealth: Payer: Self-pay | Admitting: Family

## 2022-10-21 ENCOUNTER — Encounter: Payer: Self-pay | Admitting: Family

## 2022-10-21 IMAGING — US US EXTREM LOW VENOUS
1 series · 14 of 24 positions shown · non-contrast
Comparison: None.

CLINICAL DATA: Positive D-dimer.

EXAM:
BILATERAL LOWER EXTREMITY VENOUS DOPPLER ULTRASOUND
TECHNIQUE: Gray-scale sonography with compression, as well as color and duplex
ultrasound, were performed to evaluate the deep venous system(s)
from the level of the common femoral vein through the popliteal and
proximal calf veins.

[Series 1: us venous img lower bilat (dvt) · portal-venous · 14 of 61 slices shown]
[im 1/61]
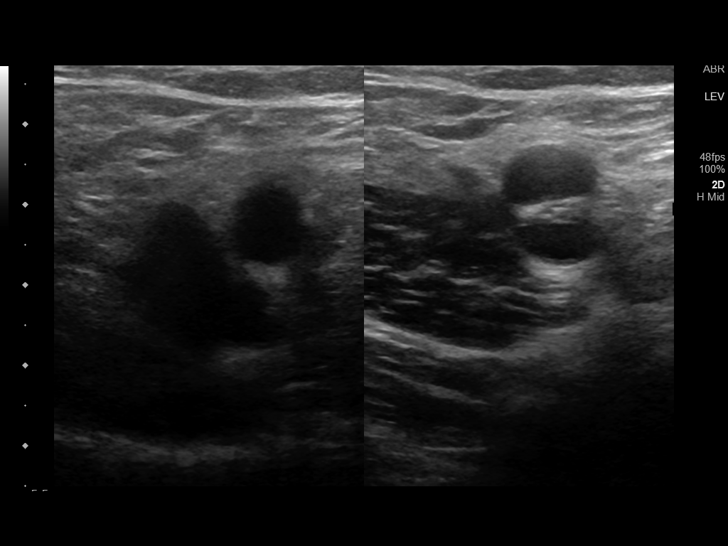
[im 6/61]
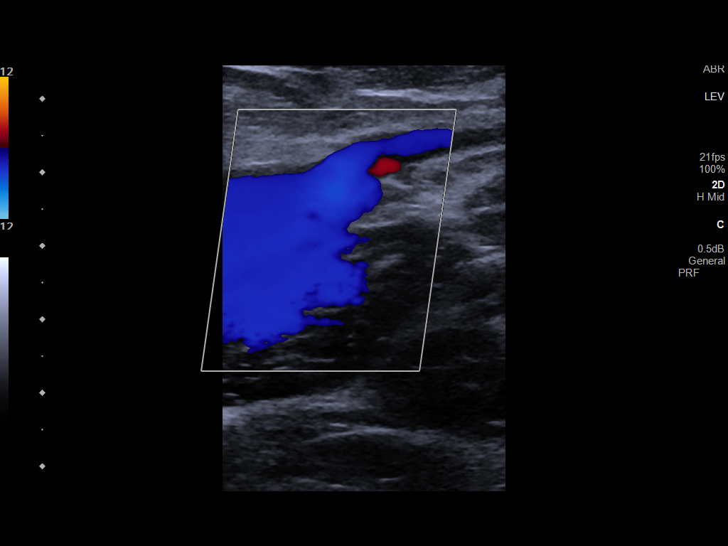
[im 11/61]
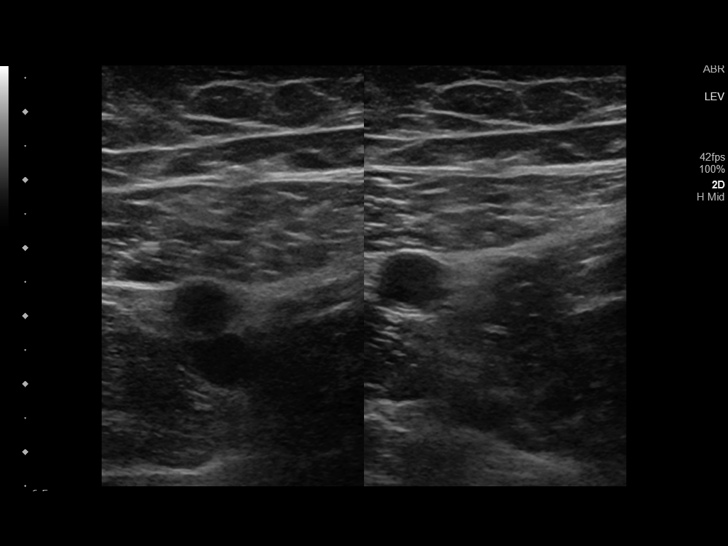
[im 16/61]
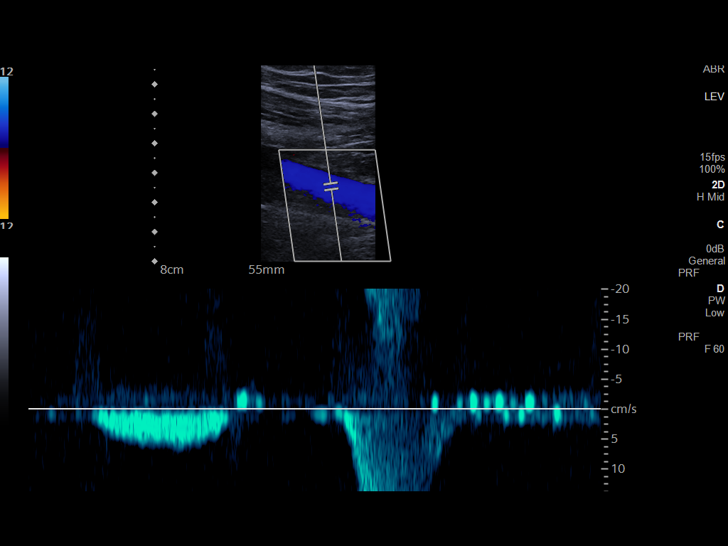
[im 19/61]
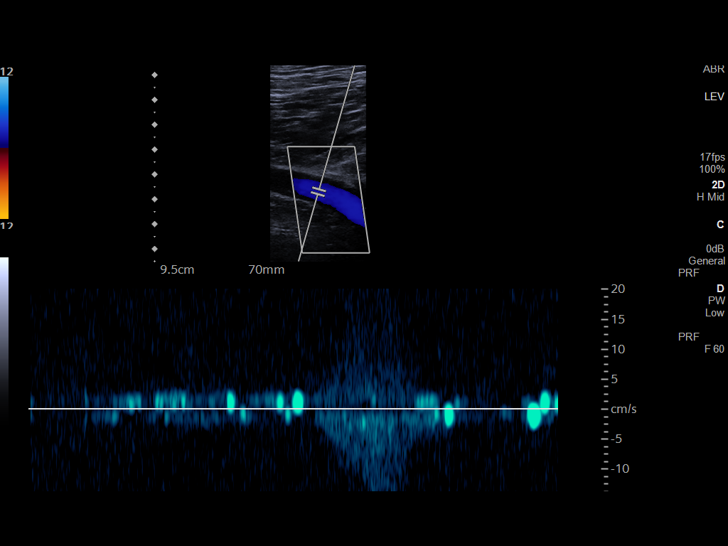
[im 24/61]
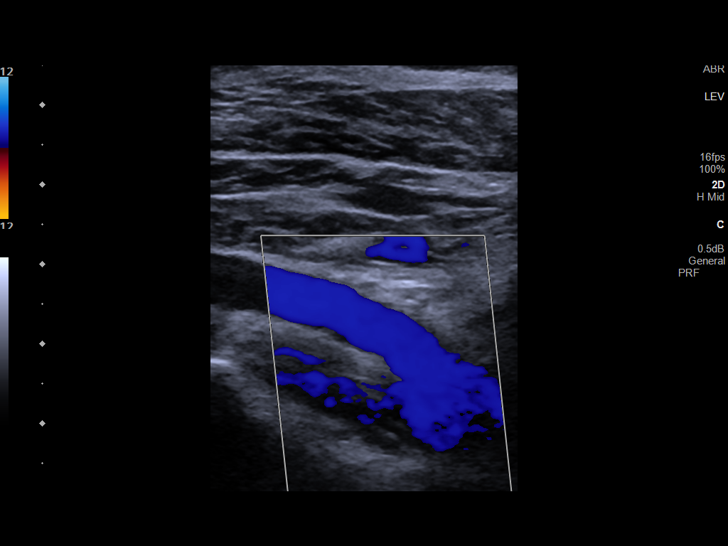
[im 29/61]
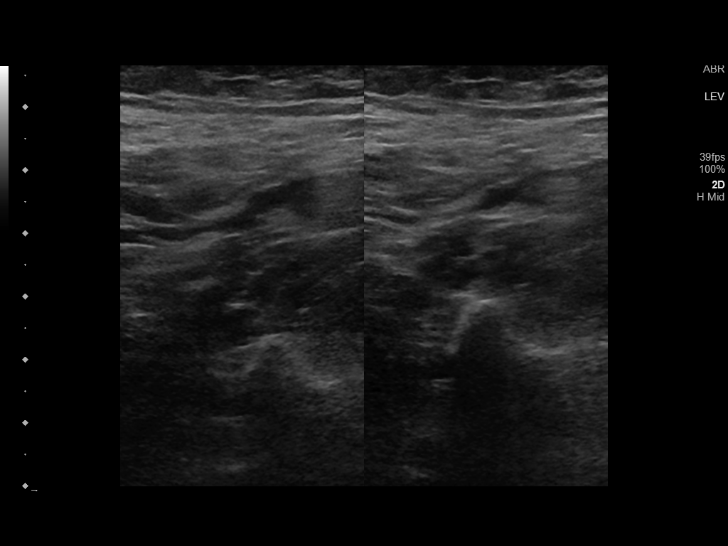
[im 32/61]
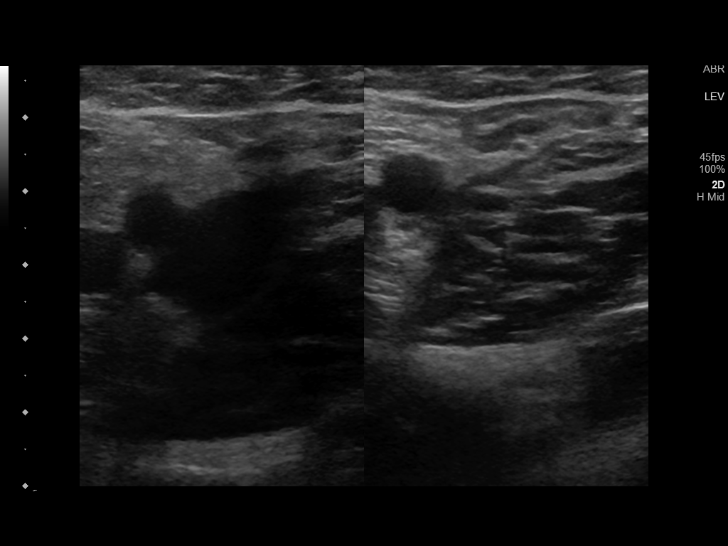
[im 37/61]
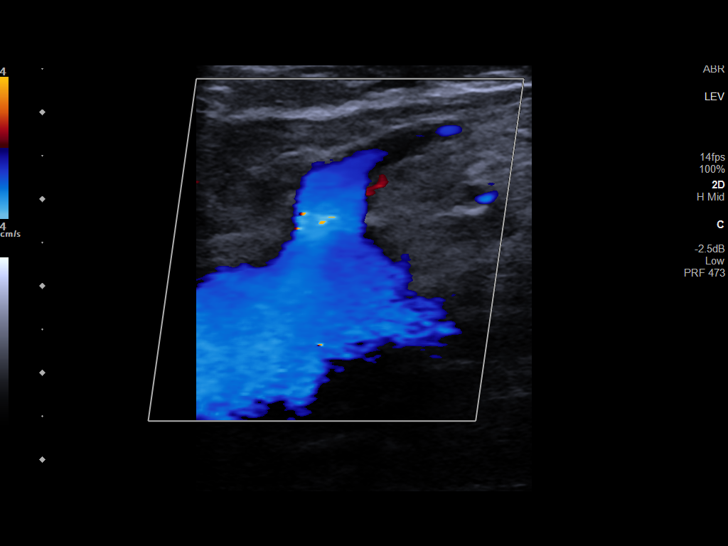
[im 42/61]
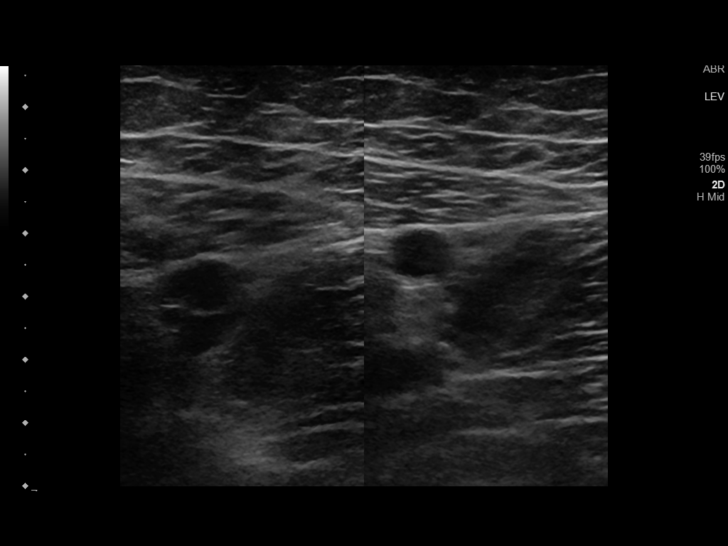
[im 47/61]
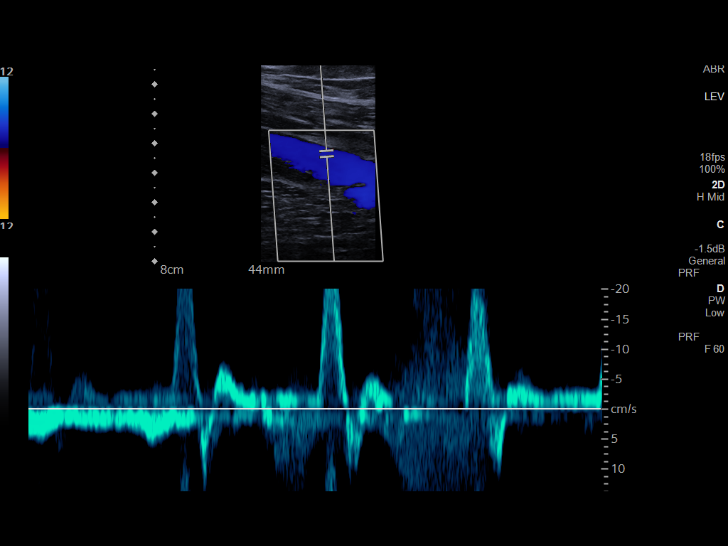
[im 50/61]
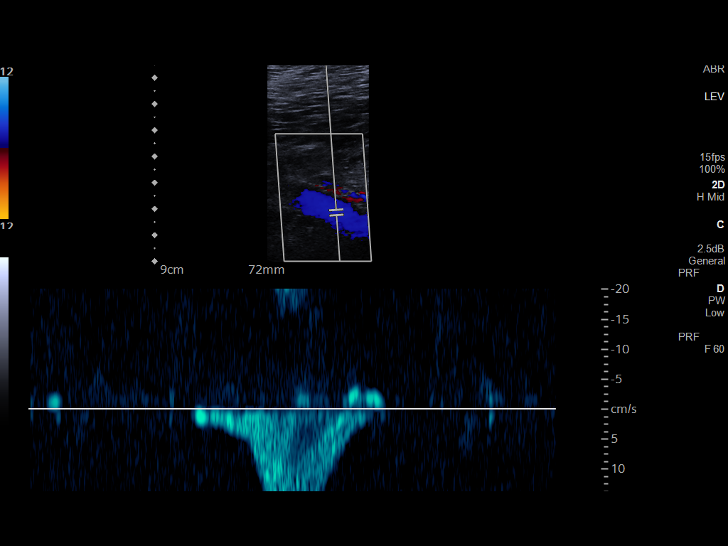
[im 55/61]
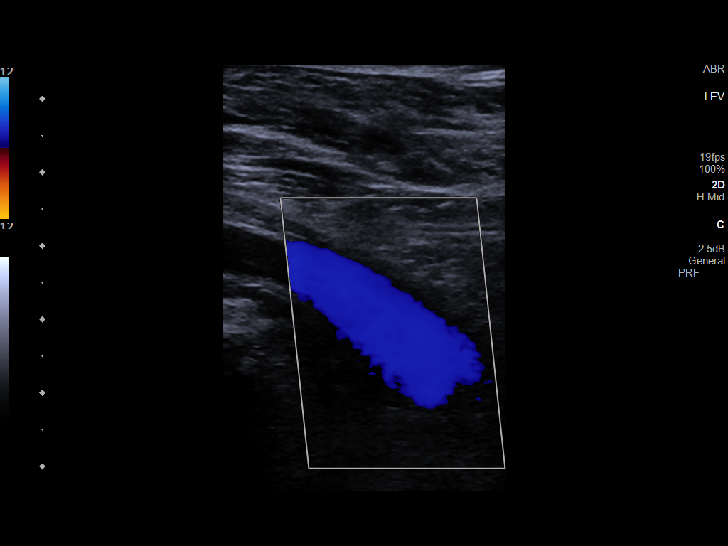
[im 61/61]
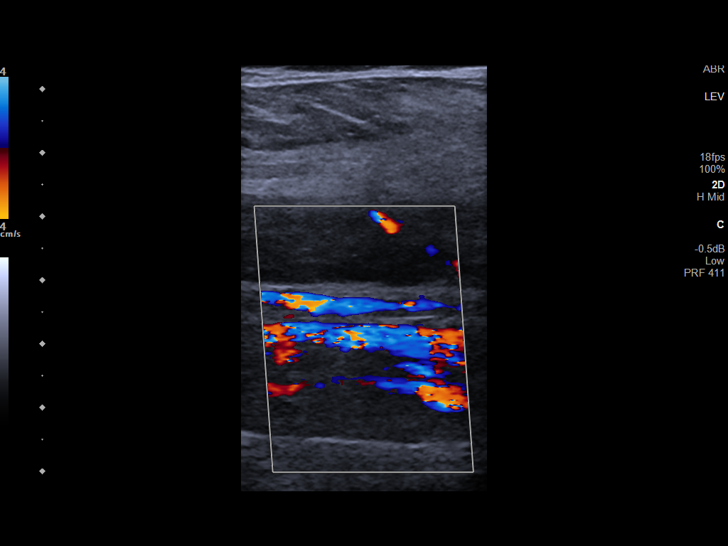

[14 of 24 positions shown; findings below may reference images not displayed]

FINDINGS: VENOUS

Normal compressibility of the common femoral, superficial femoral,
and popliteal veins, as well as the visualized calf veins.
Visualized portions of profunda femoral vein and great saphenous
vein unremarkable. No filling defects to suggest DVT on grayscale or
color Doppler imaging. Doppler waveforms show normal direction of
venous flow, normal respiratory plasticity and response to
augmentation.

Limited views of the contralateral common femoral vein are
unremarkable.

OTHER

None.

Limitations: none
IMPRESSION: Negative.

## 2022-10-21 IMAGING — CR DG CHEST 2V
1 series · 2 of 2 positions shown · non-contrast
Comparison: January 03, 2021

CLINICAL DATA: Shortness of breath.

EXAM:
CHEST - 2 VIEW

[Series 1: dg chest 2 view · 0.14mm/px · 2 of 2 slices shown]
[im 1/2]
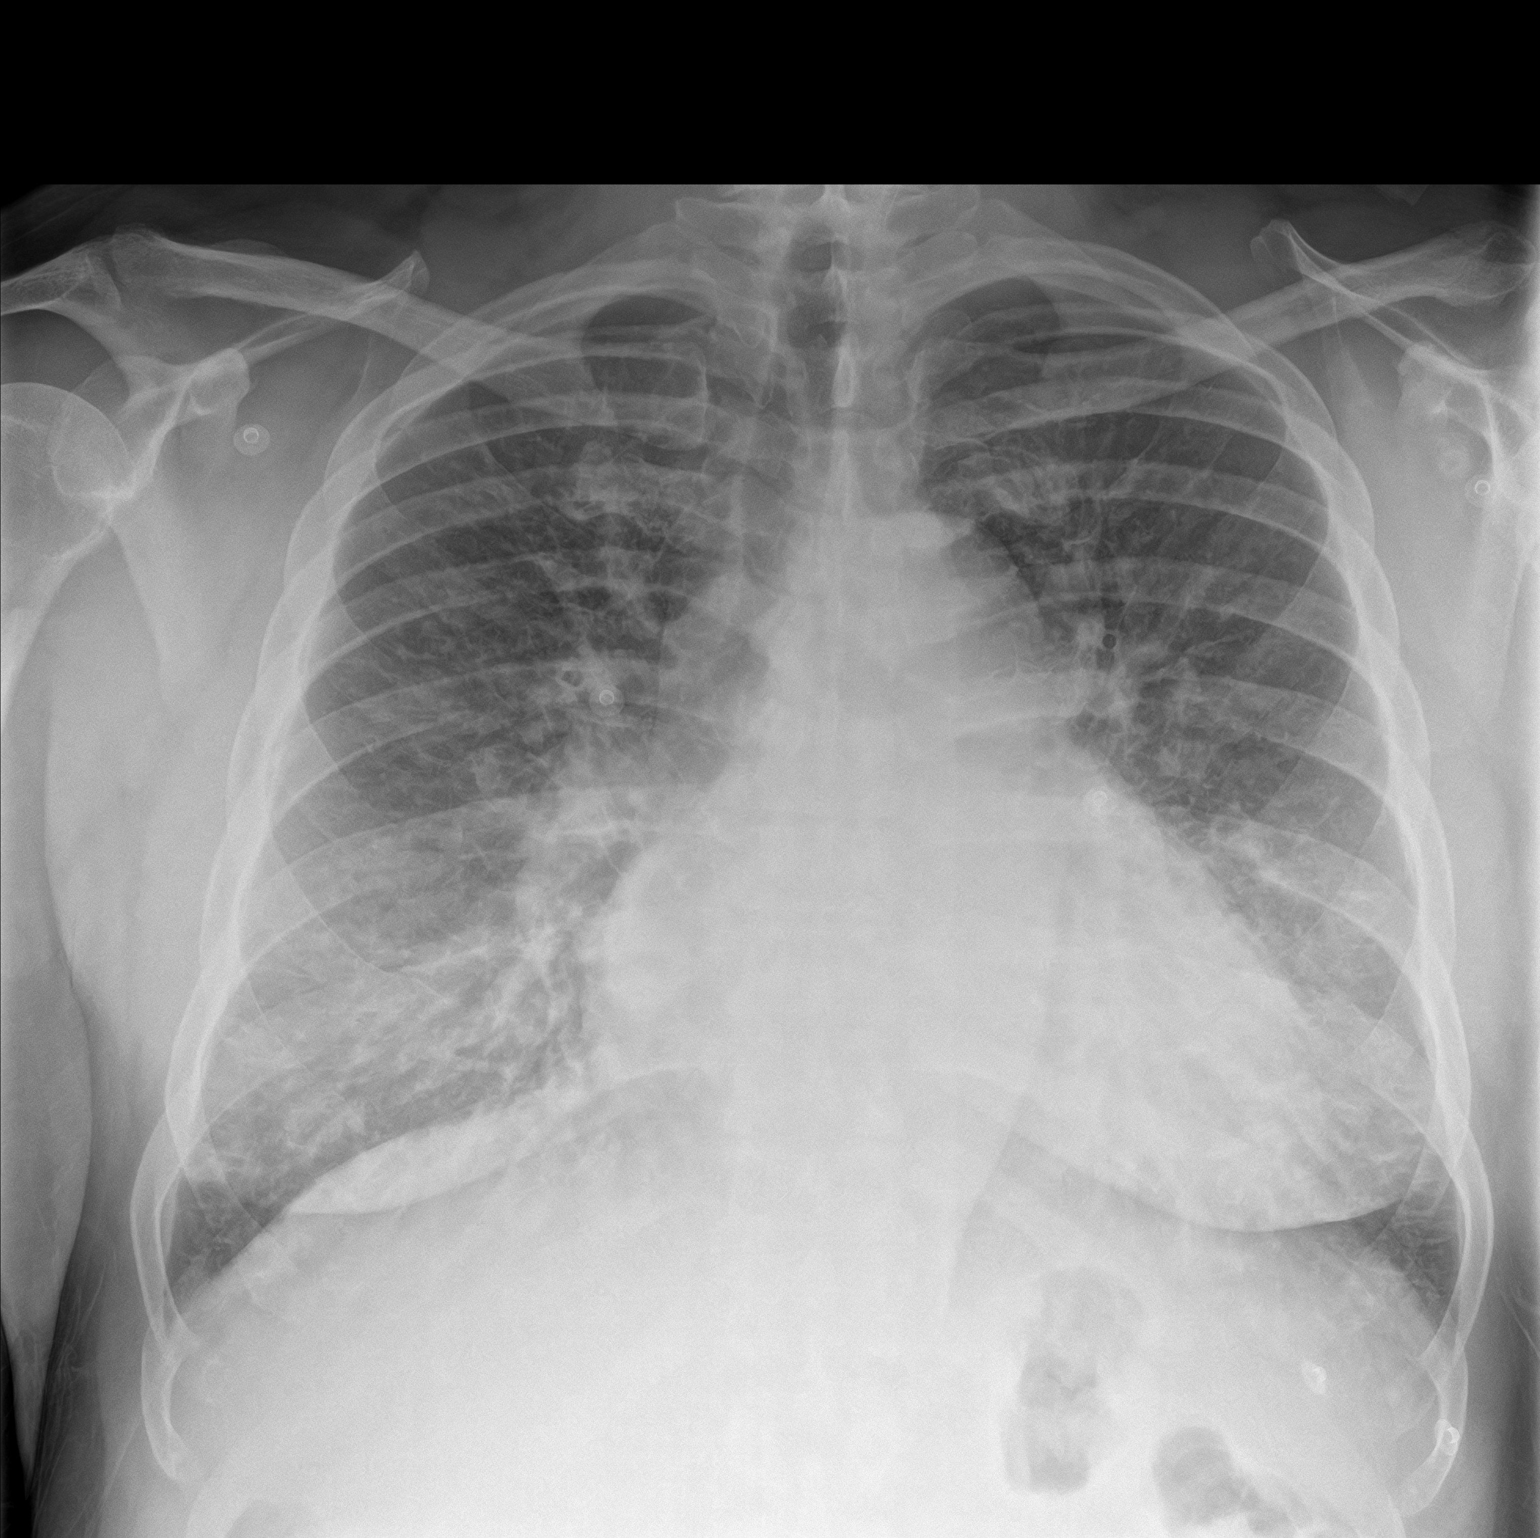
[im 2/2]
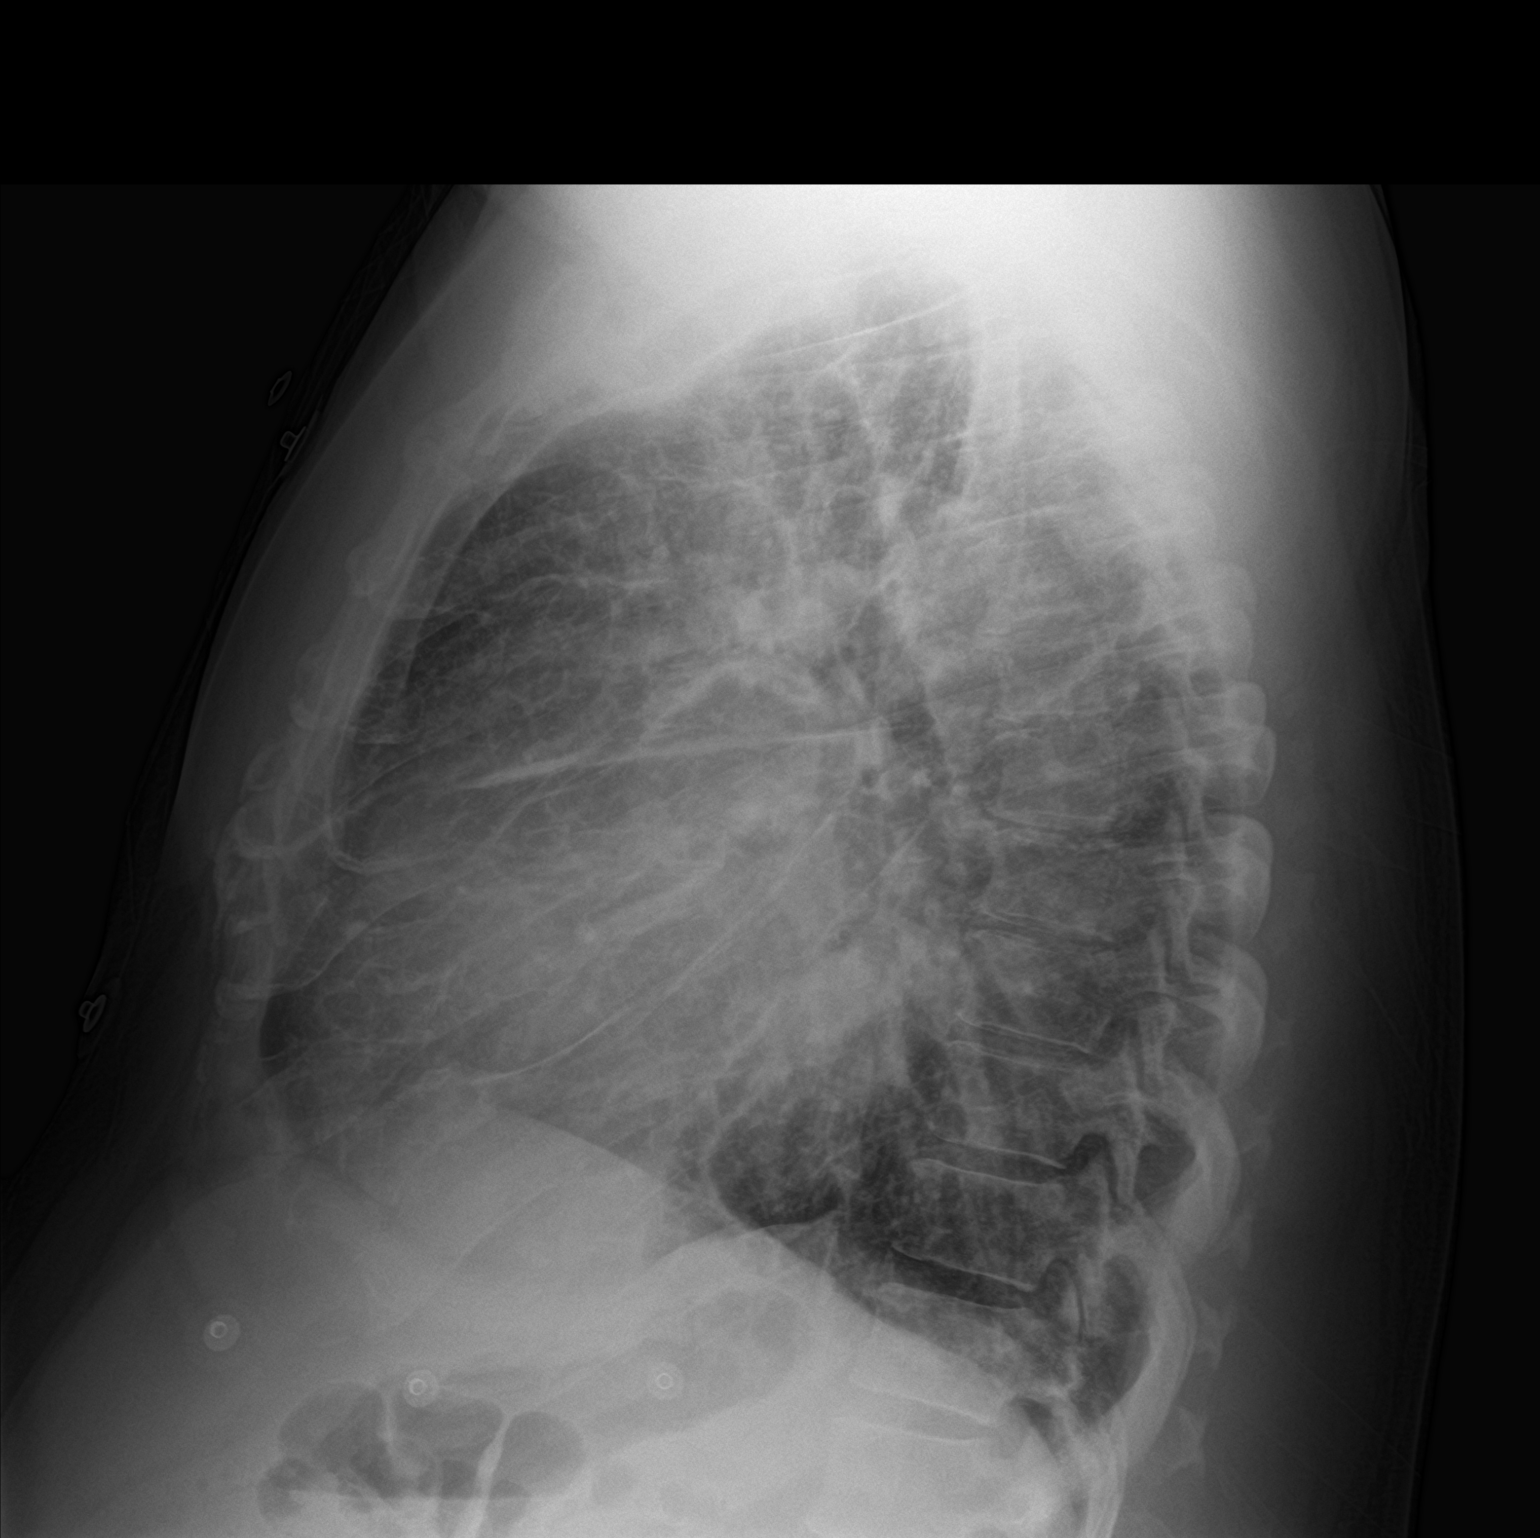

[2 of 2 positions shown; findings below may reference images not displayed]

FINDINGS: Persistent cardiomegaly. Hila and mediastinum are normal. No
pneumothorax. Opacities in the lung bases with Kerley B lines seen
in the lateral left lung base is most consistent with worsening
pulmonary edema. An infectious process is considered less likely. No
other acute abnormalities.
IMPRESSION: Findings are most consistent with cardiomegaly and pulmonary edema
as above. The edema has worsened in the interval.

## 2022-10-21 NOTE — Telephone Encounter (Signed)
Patient did not show for his Heart Failure Clinic appointment on 10/21/22.

## 2022-10-28 ENCOUNTER — Other Ambulatory Visit: Payer: Self-pay

## 2022-10-29 ENCOUNTER — Other Ambulatory Visit: Payer: Self-pay

## 2022-11-06 ENCOUNTER — Other Ambulatory Visit: Payer: Self-pay

## 2022-12-09 ENCOUNTER — Other Ambulatory Visit: Payer: Self-pay

## 2022-12-18 ENCOUNTER — Other Ambulatory Visit: Payer: Self-pay

## 2022-12-29 ENCOUNTER — Other Ambulatory Visit: Payer: Self-pay

## 2022-12-30 ENCOUNTER — Other Ambulatory Visit: Payer: Self-pay

## 2023-01-05 ENCOUNTER — Other Ambulatory Visit: Payer: Self-pay

## 2023-01-14 ENCOUNTER — Other Ambulatory Visit: Payer: Self-pay

## 2023-01-15 ENCOUNTER — Encounter: Payer: Self-pay | Admitting: Family

## 2023-01-15 ENCOUNTER — Ambulatory Visit: Payer: Self-pay | Attending: Family | Admitting: Family

## 2023-01-15 ENCOUNTER — Other Ambulatory Visit: Payer: Self-pay

## 2023-01-15 VITALS — BP 162/93 | HR 84 | Ht 66.0 in | Wt 264.0 lb

## 2023-01-15 DIAGNOSIS — I480 Paroxysmal atrial fibrillation: Secondary | ICD-10-CM

## 2023-01-15 DIAGNOSIS — I5032 Chronic diastolic (congestive) heart failure: Secondary | ICD-10-CM

## 2023-01-15 DIAGNOSIS — F1410 Cocaine abuse, uncomplicated: Secondary | ICD-10-CM | POA: Insufficient documentation

## 2023-01-15 DIAGNOSIS — F17210 Nicotine dependence, cigarettes, uncomplicated: Secondary | ICD-10-CM | POA: Insufficient documentation

## 2023-01-15 DIAGNOSIS — Z7982 Long term (current) use of aspirin: Secondary | ICD-10-CM | POA: Insufficient documentation

## 2023-01-15 DIAGNOSIS — I10 Essential (primary) hypertension: Secondary | ICD-10-CM

## 2023-01-15 DIAGNOSIS — I5033 Acute on chronic diastolic (congestive) heart failure: Secondary | ICD-10-CM

## 2023-01-15 DIAGNOSIS — I4891 Unspecified atrial fibrillation: Secondary | ICD-10-CM | POA: Insufficient documentation

## 2023-01-15 DIAGNOSIS — I130 Hypertensive heart and chronic kidney disease with heart failure and stage 1 through stage 4 chronic kidney disease, or unspecified chronic kidney disease: Secondary | ICD-10-CM | POA: Insufficient documentation

## 2023-01-15 DIAGNOSIS — Z79899 Other long term (current) drug therapy: Secondary | ICD-10-CM | POA: Insufficient documentation

## 2023-01-15 DIAGNOSIS — I440 Atrioventricular block, first degree: Secondary | ICD-10-CM | POA: Insufficient documentation

## 2023-01-15 DIAGNOSIS — N183 Chronic kidney disease, stage 3 unspecified: Secondary | ICD-10-CM | POA: Insufficient documentation

## 2023-01-15 DIAGNOSIS — I428 Other cardiomyopathies: Secondary | ICD-10-CM | POA: Insufficient documentation

## 2023-01-15 DIAGNOSIS — Z7901 Long term (current) use of anticoagulants: Secondary | ICD-10-CM | POA: Insufficient documentation

## 2023-01-15 DIAGNOSIS — Z720 Tobacco use: Secondary | ICD-10-CM

## 2023-01-15 DIAGNOSIS — I509 Heart failure, unspecified: Secondary | ICD-10-CM | POA: Insufficient documentation

## 2023-01-15 MED ORDER — APIXABAN 5 MG PO TABS
5.0000 mg | ORAL_TABLET | Freq: Two times a day (BID) | ORAL | 5 refills | Status: DC
  Filled 2023-01-15: qty 60, fill #0
  Filled 2023-01-15: qty 180, 90d supply, fill #0
  Filled 2023-05-05: qty 180, 90d supply, fill #1

## 2023-01-15 MED ORDER — FUROSEMIDE 40 MG PO TABS
40.0000 mg | ORAL_TABLET | Freq: Every day | ORAL | 5 refills | Status: DC
  Filled 2023-01-15: qty 30, 30d supply, fill #0
  Filled 2023-05-01: qty 30, 30d supply, fill #1
  Filled 2023-05-31: qty 30, 30d supply, fill #2
  Filled 2023-07-28: qty 90, 90d supply, fill #3

## 2023-01-15 MED ORDER — DAPAGLIFLOZIN PROPANEDIOL 10 MG PO TABS
10.0000 mg | ORAL_TABLET | Freq: Every day | ORAL | 5 refills | Status: DC
  Filled 2023-01-15 – 2023-05-31 (×2): qty 30, 30d supply, fill #0

## 2023-01-15 MED ORDER — CARVEDILOL 6.25 MG PO TABS
6.2500 mg | ORAL_TABLET | Freq: Two times a day (BID) | ORAL | 5 refills | Status: DC
  Filled 2023-01-15: qty 60, 30d supply, fill #0
  Filled 2023-05-01: qty 60, 30d supply, fill #1

## 2023-01-15 NOTE — Progress Notes (Signed)
[facility] HEART FAILURE CLINIC - Pharmacist Note  Brad [name]. is a 47 y.o. male with HFpEF (EF >50%) presenting to the Heart Failure Clinic for follow up. Patient reports no medication access issues, though per discussion with [facility] outpatient patient has not filled most medications recently. He states that he does have a pill box but he does not use it. It seems his mother and sister help him manage his medications at home. He reports no signs or symptoms of volume overload today. He reports not checking his weight but every 2-3 weeks. We discussed the importance of checking daily weights in heart failure. He expressed understanding. He reports that he has recently had episodes of palpitations. We discussed the role of carvedilol and apixaban in AF. He expressed understanding.   Recent ED Visit (past 6 months): none  Guideline-Directed Medical Therapy/Evidence Based Medicine ACE/ARB/ARNI: Sacubitril/valsartan 97/103 mg twice daily Beta Blocker:  none (nonadherence to carvedilol) Aldosterone Antagonist: none Diuretic:  none (nonadherence to furosemide) SGLT2i:  none (nonadherence to dapagliflozin)  Adherence Assessment Do you ever forget to take your medication? [x] Yes [] No  Do you ever skip doses due to side effects? [] Yes [x] No  Do you have trouble affording your medicines? [] Yes [x] No  Are you ever unable to pick up your medication due to transportation difficulties? [] Yes [x] No  Do you ever stop taking your medications because you don't believe they are helping? [] Yes [x] No  Do you check your weight daily? [] Yes [x] No  Adherence strategy: none reported Barriers to obtaining medications: none reported   Diagnostics ECHO: Date 03/04/2022, EF 60-65%, no RWMA, severe LVH, G3DD  Vitals    01/15/2023   11:54 AM 09/16/2022    2:56 PM 09/16/2022    2:25 AM  Vitals with BMI  Height 5\' 6"  5\' 6"    Weight 264 lbs 263 lbs 13 oz   BMI 42.63 42.6   Systolic 162 154 161  Diastolic 93 92  88  Pulse 84 82      Recent Labs    Latest Ref Rng & Units 05/13/2022    2:43 PM 03/06/2022    4:29 AM 03/05/2022    3:39 AM  BMP  Glucose 70 - 99 mg/dL 97  096  045   BUN 6 - 20 mg/dL 31  28  30    Creatinine 0.61 - 1.24 mg/dL 4.09  8.11  9.14   Sodium 135 - 145 mmol/L 140  139  143   Potassium 3.5 - 5.1 mmol/L 4.3  4.0  4.2   Chloride 98 - 111 mmol/L 107  106  110   CO2 22 - 32 mmol/L 25  24  25    Calcium 8.9 - 10.3 mg/dL 9.7  9.1  8.8     Past Medical History Past Medical History:  Diagnosis Date   Arrhythmia    atrial fibrillation   Asthma    CHF (congestive heart failure) (HCC)    CKD (chronic kidney disease), stage III (HCC)    Cocaine abuse (HCC)    Diverticulitis    Hypertension    Tobacco use disorder     Plan Continue Entresto 97/103 mg twice daily Resume Eliquis 5 mg twice daily, Coreg 6.25 mg twice daily, Farxiga 10 mg daily, and Lasix 40 mg daily New prescriptions sent to [facility] outpatient pharmacy for medication management Encourage daily weights and adherence to medication regimen Annual echo due 02/2023  Time spent: 15 minutes  Celene Squibb, PharmD PGY1 Pharmacy Resident 01/15/2023 2:25 PM

## 2023-01-15 NOTE — Progress Notes (Signed)
PCP: [facility] (last seen 08/23) Primary Cardiologist: Yvonne Kendall, MD (last seen 01/24)  HPI:  Mr Wickland is a 47 y/o male with a history of HTN, tobacco use, substance abuse, rapid atrial fibrillation and chronic heart failure.   Echo 03/04/22: EF of 60-65% along with severe LVH. Echo 01/13/21: EF of 60-65% along with severe LVH.   Has not been admitted or been in the ED in the last 6 months.   He presents today for a follow-up visit with a chief complaint of minimal fatigue with moderate exertion. Chronic in nature. Has associated SOB, cough, palpitations and occasional dizziness along with this. He said that he had "an episode" last week after he was in court where he experienced worse palpitations & SOB. Resolved after he calmed down. Denies chest pain or edema.   Brought his medication bottles with him but most of them have fill dates from 27-33 years old. He says that he's been taking them but does admit that he's not always consistent. Does not have his carvedilol bottle with him.   ROS: All systems negative except as listed in HPI, PMH and Problem List.  SH:  Social History   Socioeconomic History   Marital status: Single    Spouse name: Not on file   Number of children: Not on file   Years of education: Not on file   Highest education level: Not on file  Occupational History   Not on file  Tobacco Use   Smoking status: Some Days    Packs/day: 1    Types: Cigarettes   Smokeless tobacco: Never   Tobacco comments:    2-3 cigarettes   Vaping Use   Vaping Use: Never used  Substance and Sexual Activity   Alcohol use: Not Currently    Comment: occas   Drug use: Yes    Types: Marijuana, Cocaine    Comment: marijuana daily, cocaine only on really painful days   Sexual activity: Yes  Other Topics Concern   Not on file  Social History Narrative   Not on file   Social Determinants of Health   Financial Resource Strain: Not on file  Food  Insecurity: Not on file  Transportation Needs: Not on file  Physical Activity: Not on file  Stress: Not on file  Social Connections: Not on file  Intimate Partner Violence: Not on file    FH:  Family History  Problem Relation Age of Onset   Hypertension Mother     Past Medical History:  Diagnosis Date   Arrhythmia    atrial fibrillation   Asthma    CHF (congestive heart failure) (HCC)    CKD (chronic kidney disease), stage III (HCC)    Cocaine abuse (HCC)    Diverticulitis    Hypertension    Tobacco use disorder     Current Outpatient Medications  Medication Sig Dispense Refill   apixaban (ELIQUIS) 5 MG TABS tablet Take one tablet by mouth twice a day 180 tablet 3   aspirin EC 81 MG EC tablet Take 1 tablet (81 mg total) by mouth daily. Swallow whole. 30 tablet 11   atorvastatin (LIPITOR) 80 MG tablet Take 1 tablet (80 mg total) by mouth daily. 90 tablet 3   dapagliflozin propanediol (FARXIGA) 10 MG TABS tablet Take 1 tablet (10 mg total) by mouth daily before breakfast. 90 tablet 3   furosemide (LASIX) 40 MG tablet Take 1 tablet (40 mg total) by mouth once daily. 90 tablet  3   hydrALAZINE (APRESOLINE) 100 MG tablet Take 1 tablet (100 mg total) by mouth 3 (three) times daily. 270 tablet 3   isosorbide mononitrate (IMDUR) 30 MG 24 hr tablet Take 1 tablet (30 mg total) by mouth once daily. 90 tablet 3   sacubitril-valsartan (ENTRESTO) 97-103 MG Take 1 tablet by mouth 2 (two) times daily. 180 tablet 3   amLODipine (NORVASC) 10 MG tablet Take 1 tablet (10 mg total) by mouth daily. (Patient not taking: Reported on 01/15/2023) 90 tablet 3   carvedilol (COREG) 6.25 MG tablet Take 1 tablet (6.25 mg total) by mouth 2 (two) times daily with a meal. (Patient not taking: Reported on 01/15/2023) 60 tablet 3   No current facility-administered medications for this visit.    Vitals:   01/15/23 1154  BP: (!) 162/93  Pulse: 84  SpO2: 100%  Weight: 264 lb (119.7 kg)  Height: 5\' 6"  (1.676  m)   Lab Results  Component Value Date   CREATININE 1.77 (H) 05/13/2022   CREATININE 1.63 (H) 03/06/2022   CREATININE 1.88 (H) 03/05/2022    PHYSICAL EXAM:  General:  Well appearing. No resp difficulty HEENT: normal Neck: supple. JVP flat. No lymphadenopathy or thryomegaly appreciated. Cor: PMI normal. Regular rate & rhythm. No rubs, gallops or murmurs. Lungs: clear Abdomen: soft, nontender, nondistended. No hepatosplenomegaly. No bruits or masses.  Extremities: no cyanosis, clubbing, rash, edema Neuro: alert & orientedx3, cranial nerves grossly intact. Moves all 4 extremities w/o difficulty. Affect pleasant.   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 - Echo 01/13/21: EF of 60-65% along with severe LVH.  - does not add salt to his food; reviewed the importance of reading food labels for sodium content - continue entresto 97/103mg  (getting this through [employer] patient assistance) - resume carvedilol 6.25mg  BID - continue farxiga 10mg  daily - continue furosemide 40mg  daily - will need updated echo later this summer - lengthy discussion had about taking all of his meds consistently - BNP 03/04/22 was 713.0  2: HTN with CKD- - BP 162/93; resuming carvedilol per above - emphasized the importance of taking his medications consistently - saw PCP Sharon Seller) 08/23 - BMP 05/13/22 reviewed and showed sodium 140, potassium 4.3, creatinine 1.77 and GFR 47  3: Tobacco use- - smoking 2 cigarettes daily - complete cessation discussed for 3 minutes with him  4: Cocaine abuse- - says that  he occasionally uses cocaine when his back pain is so severe that he can't stand it - says that he's using less often and tends to be on the weekends - does endorse marijuana use  5: Atrial fibrillation- - saw cardiology ([facility]) 01/24 - EKG today: NSR with 1st degree AV block; unchanged from 01/24 - continue apixaban 5mg  BID - continue ASA 81mg  daily  Return in 1 week for BMP, sooner if needed

## 2023-01-20 ENCOUNTER — Other Ambulatory Visit: Payer: Self-pay

## 2023-01-21 ENCOUNTER — Other Ambulatory Visit: Payer: Self-pay

## 2023-01-26 ENCOUNTER — Encounter: Payer: Self-pay | Admitting: Family

## 2023-01-26 ENCOUNTER — Other Ambulatory Visit
Admission: RE | Admit: 2023-01-26 | Discharge: 2023-01-26 | Disposition: A | Discharge status: Home / Self Care | Payer: Self-pay | Source: Ambulatory Visit | Attending: Family | Admitting: Family

## 2023-01-26 ENCOUNTER — Other Ambulatory Visit: Payer: Self-pay

## 2023-01-26 ENCOUNTER — Ambulatory Visit (HOSPITAL_BASED_OUTPATIENT_CLINIC_OR_DEPARTMENT_OTHER): Payer: Self-pay | Admitting: Family

## 2023-01-26 VITALS — BP 176/114 | HR 67 | Wt 268.4 lb

## 2023-01-26 DIAGNOSIS — I5032 Chronic diastolic (congestive) heart failure: Secondary | ICD-10-CM

## 2023-01-26 DIAGNOSIS — I10 Essential (primary) hypertension: Secondary | ICD-10-CM

## 2023-01-26 DIAGNOSIS — I480 Paroxysmal atrial fibrillation: Secondary | ICD-10-CM

## 2023-01-26 DIAGNOSIS — F1410 Cocaine abuse, uncomplicated: Secondary | ICD-10-CM

## 2023-01-26 DIAGNOSIS — Z720 Tobacco use: Secondary | ICD-10-CM

## 2023-01-26 LAB — BASIC METABOLIC PANEL
Anion gap: 9 (ref 5–15)
BUN: 33 mg/dL — ABNORMAL HIGH (ref 6–20)
CO2: 25 mmol/L (ref 22–32)
Calcium: 9.3 mg/dL (ref 8.9–10.3)
Chloride: 106 mmol/L (ref 98–111)
Creatinine, Ser: 1.8 mg/dL — ABNORMAL HIGH (ref 0.61–1.24)
GFR, Estimated: 46 mL/min — ABNORMAL LOW (ref >60)
Glucose, Bld: 103 mg/dL — ABNORMAL HIGH (ref 70–99)
Potassium: 3.9 mmol/L (ref 3.5–5.1)
Sodium: 140 mmol/L (ref 135–145)

## 2023-01-26 MED ORDER — AMLODIPINE BESYLATE 10 MG PO TABS
10.0000 mg | ORAL_TABLET | Freq: Every day | ORAL | 3 refills | Status: DC
  Filled 2023-01-26: qty 90, 90d supply, fill #0
  Filled 2023-05-05: qty 90, 90d supply, fill #1
  Filled 2023-08-04: qty 90, 90d supply, fill #2

## 2023-01-26 NOTE — Patient Instructions (Addendum)
Please call your primary care doctor to make an appointment. The number is [phone]   Start amlodipine as 1 tablet every morning. This prescription was sent to the pharmacy across the street.    Go to the medical Mall to get your lab work drawn.

## 2023-01-26 NOTE — Progress Notes (Signed)
PCP: [facility] (last seen 08/23) Primary Cardiologist: Yvonne Kendall, MD (last seen 01/24)  HPI:  Mr Hamernik is a 47 y/o male with a history of HTN, tobacco use, substance abuse, rapid atrial fibrillation and chronic heart failure.   Echo 03/04/22: EF of 60-65% along with severe LVH. Echo 01/13/21: EF of 60-65% along with severe LVH.   Has not been admitted or been in the ED in the last 6 months.   He presents today with a chief complaint of a HF f/u visit. Currently denies fatigue, SOB, cough, chest pain, palpitations, abdominal distention, pedal edema, dizziness or difficulty sleeping. Says that he hasn't missed any of his medications since last visit and he's taking his carvedilol/ entresto both twice daily. Did smoke cigarettes right before coming in to his appointment today.   ROS: All systems negative except as listed in HPI, PMH and Problem List.  SH:  Social History   Socioeconomic History   Marital status: Single    Spouse name: Not on file   Number of children: Not on file   Years of education: Not on file   Highest education level: Not on file  Occupational History   Not on file  Tobacco Use   Smoking status: Some Days    Packs/day: 1    Types: Cigarettes   Smokeless tobacco: Never   Tobacco comments:    2-3 cigarettes   Vaping Use   Vaping Use: Never used  Substance and Sexual Activity   Alcohol use: Not Currently    Comment: occas   Drug use: Yes    Types: Marijuana, Cocaine    Comment: marijuana daily, cocaine only on really painful days   Sexual activity: Yes  Other Topics Concern   Not on file  Social History Narrative   Not on file   Social Determinants of Health   Financial Resource Strain: Not on file  Food Insecurity: Not on file  Transportation Needs: Not on file  Physical Activity: Not on file  Stress: Not on file  Social Connections: Not on file  Intimate Partner Violence: Not on file    FH:  Family History   Problem Relation Age of Onset   Hypertension Mother     Past Medical History:  Diagnosis Date   Arrhythmia    atrial fibrillation   Asthma    CHF (congestive heart failure) (HCC)    CKD (chronic kidney disease), stage III (HCC)    Cocaine abuse (HCC)    Diverticulitis    Hypertension    Tobacco use disorder     Current Outpatient Medications  Medication Sig Dispense Refill   apixaban (ELIQUIS) 5 MG TABS tablet Take 1 tablet (5 mg total) by mouth 2 (two) times daily. 60 tablet 5   aspirin EC 81 MG EC tablet Take 1 tablet (81 mg total) by mouth daily. Swallow whole. (Patient not taking: Reported on 01/15/2023) 30 tablet 11   atorvastatin (LIPITOR) 80 MG tablet Take 1 tablet (80 mg total) by mouth daily. 90 tablet 3   carvedilol (COREG) 6.25 MG tablet Take 1 tablet (6.25 mg total) by mouth 2 (two) times daily with a meal. 60 tablet 5   dapagliflozin propanediol (FARXIGA) 10 MG TABS tablet Take 1 tablet (10 mg total) by mouth daily before breakfast. 30 tablet 5   furosemide (LASIX) 40 MG tablet Take 1 tablet (40 mg total) by mouth once daily. 30 tablet 5   isosorbide mononitrate (IMDUR) 30 MG 24  hr tablet Take 1 tablet (30 mg total) by mouth once daily. 90 tablet 3   sacubitril-valsartan (ENTRESTO) 97-103 MG Take 1 tablet by mouth 2 (two) times daily. (Patient not taking: Reported on 01/15/2023) 180 tablet 3   No current facility-administered medications for this visit.   Vitals:   01/26/23 0821  BP: (!) 176/114  Pulse: 67  SpO2: 95%  Weight: 268 lb 6.4 oz (121.7 kg)   Wt Readings from Last 3 Encounters:  01/26/23 268 lb 6.4 oz (121.7 kg)  01/15/23 264 lb (119.7 kg)  09/16/22 263 lb 12.8 oz (119.7 kg)   Lab Results  Component Value Date   CREATININE 1.77 (H) 05/13/2022   CREATININE 1.63 (H) 03/06/2022   CREATININE 1.88 (H) 03/05/2022    PHYSICAL EXAM:  General:  Well appearing. No resp difficulty HEENT: normal Neck: supple. JVP flat. No lymphadenopathy or thryomegaly  appreciated. Cor: PMI normal. Regular rate & rhythm. No rubs, gallops or murmurs. Lungs: clear Abdomen: soft, nontender, nondistended. No hepatosplenomegaly. No bruits or masses.  Extremities: no cyanosis, clubbing, rash, edema Neuro: alert & oriented x3, cranial nerves grossly intact. Moves all 4 extremities w/o difficulty. Affect pleasant.   ECG: not done   ASSESSMENT & PLAN:  1: NICM with preserved ejection fraction with structural changes (LVH)- - likely HTN/ AF - NYHA class I - euvolemic today - weighing daily; reminded to call for an overnight weight gain of > 2 pounds or a weekly weight gain of > 5 pounds - weight up 4 pounds from last visit here 10 days ago - echo 03/04/22: EF of 60-65% along with severe LVH. - Echo 01/13/21: EF of 60-65% along with severe LVH.  - does not add salt to his food; reviewed the importance of reading food labels for sodium content - continue entresto 97/103mg  (getting this through [employer] patient assistance) - continue carvedilol 6.25mg  BID - continue farxiga 10mg  daily - continue furosemide 40mg  daily - discuss adding spironolactone at next visit - BMP today - will need updated echo later this summer - BNP 03/04/22 was 713.0  2: HTN with CKD- - BP 176/114; unchanged from recheck with manual cuff; denies missing any meds since last visit - add amlodipine 10mg  daily - discussed referral to HTN clinic if BP does not start to decline with the addition of amlodipine - saw PCP Sharon Seller) 08/23; phone number provided so he can make an appt w/ PCP - BMP 05/13/22 reviewed and showed sodium 140, potassium 4.3, creatinine 1.77 and GFR 47  3: Tobacco use- - smoking 2 cigarettes daily & he says that he smoked right before coming to his appt today - complete cessation discussed for 3 minutes with him  4: Cocaine abuse- - says that he occasionally uses cocaine when his back pain is so severe that he can't stand it - says that he's using less often and  tends to be on the weekends - does endorse marijuana use  5: Atrial fibrillation- - saw cardiology ([place]) 01/24 - continue apixaban 5mg  BID - continue ASA 81mg  daily  Return in 1 week, sooner if needed

## 2023-01-28 ENCOUNTER — Other Ambulatory Visit: Payer: Self-pay

## 2023-02-03 ENCOUNTER — Encounter: Payer: Self-pay | Admitting: Family

## 2023-02-03 ENCOUNTER — Ambulatory Visit: Payer: Self-pay | Attending: Family | Admitting: Family

## 2023-02-03 ENCOUNTER — Other Ambulatory Visit: Payer: Self-pay

## 2023-02-03 VITALS — BP 157/113 | HR 67 | Wt 267.4 lb

## 2023-02-03 DIAGNOSIS — I10 Essential (primary) hypertension: Secondary | ICD-10-CM

## 2023-02-03 DIAGNOSIS — Z720 Tobacco use: Secondary | ICD-10-CM

## 2023-02-03 DIAGNOSIS — Z79899 Other long term (current) drug therapy: Secondary | ICD-10-CM | POA: Insufficient documentation

## 2023-02-03 DIAGNOSIS — N183 Chronic kidney disease, stage 3 unspecified: Secondary | ICD-10-CM | POA: Insufficient documentation

## 2023-02-03 DIAGNOSIS — Z7901 Long term (current) use of anticoagulants: Secondary | ICD-10-CM | POA: Insufficient documentation

## 2023-02-03 DIAGNOSIS — I480 Paroxysmal atrial fibrillation: Secondary | ICD-10-CM

## 2023-02-03 DIAGNOSIS — F1410 Cocaine abuse, uncomplicated: Secondary | ICD-10-CM | POA: Insufficient documentation

## 2023-02-03 DIAGNOSIS — I5032 Chronic diastolic (congestive) heart failure: Secondary | ICD-10-CM | POA: Insufficient documentation

## 2023-02-03 DIAGNOSIS — I4891 Unspecified atrial fibrillation: Secondary | ICD-10-CM | POA: Insufficient documentation

## 2023-02-03 DIAGNOSIS — I130 Hypertensive heart and chronic kidney disease with heart failure and stage 1 through stage 4 chronic kidney disease, or unspecified chronic kidney disease: Secondary | ICD-10-CM | POA: Insufficient documentation

## 2023-02-03 DIAGNOSIS — F17210 Nicotine dependence, cigarettes, uncomplicated: Secondary | ICD-10-CM | POA: Insufficient documentation

## 2023-02-03 DIAGNOSIS — Z7982 Long term (current) use of aspirin: Secondary | ICD-10-CM | POA: Insufficient documentation

## 2023-02-03 DIAGNOSIS — I428 Other cardiomyopathies: Secondary | ICD-10-CM | POA: Insufficient documentation

## 2023-02-03 DIAGNOSIS — M545 Low back pain, unspecified: Secondary | ICD-10-CM | POA: Insufficient documentation

## 2023-02-03 MED ORDER — SPIRONOLACTONE 25 MG PO TABS
12.5000 mg | ORAL_TABLET | Freq: Every day | ORAL | 3 refills | Status: DC
  Filled 2023-02-03 (×2): qty 30, 60d supply, fill #0
  Filled 2023-05-05: qty 30, 60d supply, fill #1

## 2023-02-03 NOTE — Patient Instructions (Signed)
Go to the pharmacy and pick up spironolactone. You will take this as 1/2 tablet every morning.   One day next week, go to the [facility] entrance to get your lab work drawn. You will have to stop at the registration desk to get registered and get your arm bracelet for the labs.

## 2023-02-03 NOTE — Progress Notes (Signed)
PCP: [facility] [place] (last seen 08/23; returns 09/24) Primary Cardiologist: Yvonne Kendall, MD (last seen 01/24)  HPI:  Brad [name] is a 47 y/o male with a history of HTN, tobacco use, substance abuse, rapid atrial fibrillation and chronic heart failure.   Echo 03/04/22: EF of 60-65% along with severe LVH. Echo 01/13/21: EF of 60-65% along with severe LVH.   Has not been admitted or been in the ED in the last 6 months.   He presents today for a HF f/u visit with a chief complaint of LBP. Chronic in nature although he says that it has flared up recently and it's interfering with his sleep. Has associated fatigue due to this. Denies SOB, cough, chest pain, abdominal distention, palpitations, dizziness or pedal edema. Has not been weighing himself daily although does have scales. Due to his increased back pain, he did use cocaine ~ 1 week ago as he says that is the only thing that helps.  Amlodipine 10mg  was added at last visit due to HTN.   ROS: All systems negative except as listed in HPI, PMH and Problem List.  SH:  Social History   Socioeconomic History   Marital status: Single    Spouse name: Not on file   Number of children: Not on file   Years of education: Not on file   Highest education level: Not on file  Occupational History   Not on file  Tobacco Use   Smoking status: Some Days    Packs/day: 1    Types: Cigarettes   Smokeless tobacco: Never   Tobacco comments:    2-3 cigarettes   Vaping Use   Vaping Use: Never used  Substance and Sexual Activity   Alcohol use: Not Currently    Comment: occas   Drug use: Yes    Types: Marijuana, Cocaine    Comment: marijuana daily, cocaine only on really painful days   Sexual activity: Yes  Other Topics Concern   Not on file  Social History Narrative   Not on file   Social Determinants of Health   Financial Resource Strain: Not on file  Food Insecurity: Not on file  Transportation Needs: Not on file   Physical Activity: Not on file  Stress: Not on file  Social Connections: Not on file  Intimate Partner Violence: Not on file    FH:  Family History  Problem Relation Age of Onset   Hypertension Mother     Past Medical History:  Diagnosis Date   Arrhythmia    atrial fibrillation   Asthma    CHF (congestive heart failure) (HCC)    CKD (chronic kidney disease), stage III (HCC)    Cocaine abuse (HCC)    Diverticulitis    Hypertension    Tobacco use disorder     Current Outpatient Medications  Medication Sig Dispense Refill   amLODipine (NORVASC) 10 MG tablet Take 1 tablet (10 mg total) by mouth daily. 90 tablet 3   apixaban (ELIQUIS) 5 MG TABS tablet Take 1 tablet (5 mg total) by mouth 2 (two) times daily. 60 tablet 5   aspirin EC 81 MG EC tablet Take 1 tablet (81 mg total) by mouth daily. Swallow whole. 30 tablet 11   atorvastatin (LIPITOR) 80 MG tablet Take 1 tablet (80 mg total) by mouth daily. 90 tablet 3   carvedilol (COREG) 6.25 MG tablet Take 1 tablet (6.25 mg total) by mouth 2 (two) times daily with a meal. 60 tablet 5  dapagliflozin propanediol (FARXIGA) 10 MG TABS tablet Take 1 tablet (10 mg total) by mouth daily before breakfast. 30 tablet 5   furosemide (LASIX) 40 MG tablet Take 1 tablet (40 mg total) by mouth once daily. 30 tablet 5   isosorbide mononitrate (IMDUR) 30 MG 24 hr tablet Take 1 tablet (30 mg total) by mouth once daily. 90 tablet 3   sacubitril-valsartan (ENTRESTO) 97-103 MG Take 1 tablet by mouth 2 (two) times daily. 180 tablet 3   No current facility-administered medications for this visit.   Vitals:   02/03/23 0908  BP: (!) 157/113  Pulse: 67  SpO2: 96%  Weight: 267 lb 6.4 oz (121.3 kg)   Wt Readings from Last 3 Encounters:  02/03/23 267 lb 6.4 oz (121.3 kg)  01/26/23 268 lb 6.4 oz (121.7 kg)  01/15/23 264 lb (119.7 kg)   Lab Results  Component Value Date   CREATININE 1.80 (H) 01/26/2023   CREATININE 1.77 (H) 05/13/2022   CREATININE  1.63 (H) 03/06/2022   PHYSICAL EXAM:  General:  Well appearing. No resp difficulty HEENT: normal Neck: supple. JVP flat. No lymphadenopathy or thryomegaly appreciated. Cor: PMI normal. Regular rate & rhythm. No rubs, gallops or murmurs. Lungs: clear Abdomen: soft, nontender, nondistended. No hepatosplenomegaly. No bruits or masses.  Extremities: no cyanosis, clubbing, rash, edema Neuro: alert & oriented x3, cranial nerves grossly intact. Moves all 4 extremities w/o difficulty. Affect pleasant.   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 - Echo 01/13/21: EF of 60-65% along with severe LVH.  - does not add salt to his food; reviewed the importance of reading food labels for sodium content - continue entresto 97/103mg  (getting this through [employer] patient assistance) - continue carvedilol 6.25mg  BID - continue farxiga 10mg  daily - continue furosemide 40mg  daily - add spironolactone 12.5mg  daily - BMP next week & then again at next visit in 1 month - will need updated echo later this summer - BNP 03/04/22 was 713.0 - PharmD reconciled meds w/ patient  2: HTN with CKD- - BP 157/113 - continue amlodipine 10mg  daily - adding spironolactone 12.5mg  daily; plan to titrate this at next visit and then refer to HTN clinic if BP remains elevated - consider renal ultrasound but he currently has no insurance coverage - saw PCP Sharon Seller) 08/23; has f/u 09/24 - BMP 01/26/23 reviewed and showed sodium 140, potassium 3.9, creatinine 1.80 and GFR 46  3: Tobacco use- - smoking 2 cigarettes daily & says that he's trying to quit - complete  cessation discussed for 3 minutes with him  4: Cocaine abuse- - says that he occasionally uses cocaine when his back pain is so severe that he can't stand it - last cocaine use was ~ 1 week ago  5: Atrial fibrillation- - saw cardiology ([place]) 01/24 - continue apixaban 5mg  BID - continue ASA 81mg  daily  Return in 1 month, sooner if needed.

## 2023-02-12 ENCOUNTER — Other Ambulatory Visit: Payer: Self-pay

## 2023-02-23 ENCOUNTER — Other Ambulatory Visit: Payer: Self-pay

## 2023-02-25 ENCOUNTER — Other Ambulatory Visit
Admission: RE | Admit: 2023-02-25 | Discharge: 2023-02-25 | Disposition: A | Discharge status: Home / Self Care | Payer: Self-pay | Attending: Family | Admitting: Family

## 2023-02-25 DIAGNOSIS — I5032 Chronic diastolic (congestive) heart failure: Secondary | ICD-10-CM | POA: Insufficient documentation

## 2023-02-25 LAB — BASIC METABOLIC PANEL
Anion gap: 9 (ref 5–15)
BUN: 48 mg/dL — ABNORMAL HIGH (ref 6–20)
CO2: 22 mmol/L (ref 22–32)
Calcium: 8.9 mg/dL (ref 8.9–10.3)
Chloride: 107 mmol/L (ref 98–111)
Creatinine, Ser: 2.5 mg/dL — ABNORMAL HIGH (ref 0.61–1.24)
GFR, Estimated: 31 mL/min — ABNORMAL LOW (ref >60)
Glucose, Bld: 113 mg/dL — ABNORMAL HIGH (ref 70–99)
Potassium: 3.8 mmol/L (ref 3.5–5.1)
Sodium: 138 mmol/L (ref 135–145)

## 2023-02-27 ENCOUNTER — Telehealth: Payer: Self-pay

## 2023-02-27 DIAGNOSIS — I5032 Chronic diastolic (congestive) heart failure: Secondary | ICD-10-CM

## 2023-02-27 NOTE — Telephone Encounter (Signed)
-----   Message from Electa Sniff, RN sent at 02/27/2023 12:27 PM EDT -----  ----- Message ----- From: Delma Freeze, FNP Sent: 02/25/2023   6:02 PM EDT To: Electa Sniff, RN; Chrystine Oiler, CMA; #  Please call and have him stop taking spironolactone as kidney function has worsened. Recheck this at visit next week.

## 2023-02-27 NOTE — Telephone Encounter (Signed)
Spoke with patient's mother, Brad [name], with pt's permission.  She acknowledged and read back instructions that her son should stop his spironolactone and come to [facility]  for lab work between 7/12-7/17. She states she will call back if Tranell has any questions.  BMET order placed.  Med list updated.

## 2023-03-05 ENCOUNTER — Encounter: Payer: Self-pay | Admitting: Family

## 2023-03-05 ENCOUNTER — Other Ambulatory Visit
Admission: RE | Admit: 2023-03-05 | Discharge: 2023-03-05 | Disposition: A | Discharge status: Home / Self Care | Payer: Self-pay | Source: Ambulatory Visit | Attending: Family | Admitting: Family

## 2023-03-05 ENCOUNTER — Telehealth: Payer: Self-pay

## 2023-03-05 ENCOUNTER — Ambulatory Visit (HOSPITAL_BASED_OUTPATIENT_CLINIC_OR_DEPARTMENT_OTHER): Payer: Self-pay | Admitting: Family

## 2023-03-05 VITALS — BP 155/97 | HR 80 | Ht 66.0 in | Wt 267.0 lb

## 2023-03-05 DIAGNOSIS — Z720 Tobacco use: Secondary | ICD-10-CM

## 2023-03-05 DIAGNOSIS — F1410 Cocaine abuse, uncomplicated: Secondary | ICD-10-CM

## 2023-03-05 DIAGNOSIS — I480 Paroxysmal atrial fibrillation: Secondary | ICD-10-CM

## 2023-03-05 DIAGNOSIS — I5032 Chronic diastolic (congestive) heart failure: Secondary | ICD-10-CM

## 2023-03-05 DIAGNOSIS — I10 Essential (primary) hypertension: Secondary | ICD-10-CM

## 2023-03-05 LAB — BASIC METABOLIC PANEL
Anion gap: 10 (ref 5–15)
BUN: 40 mg/dL — ABNORMAL HIGH (ref 6–20)
CO2: 24 mmol/L (ref 22–32)
Calcium: 9.2 mg/dL (ref 8.9–10.3)
Chloride: 106 mmol/L (ref 98–111)
Creatinine, Ser: 2.25 mg/dL — ABNORMAL HIGH (ref 0.61–1.24)
GFR, Estimated: 35 mL/min — ABNORMAL LOW (ref >60)
Glucose, Bld: 131 mg/dL — ABNORMAL HIGH (ref 70–99)
Potassium: 3.8 mmol/L (ref 3.5–5.1)
Sodium: 140 mmol/L (ref 135–145)

## 2023-03-05 NOTE — Telephone Encounter (Signed)
-----   Message from Nurse Sheryn Bison sent at 03/05/2023  2:29 PM EDT -----  ----- Message ----- From: Delma Freeze, FNP Sent: 03/05/2023   2:26 PM EDT To: [facility] Triage  Kidney function improving but not quite back down to baseline. Please get this checked in 10-14 days.

## 2023-03-05 NOTE — Patient Instructions (Signed)
Go to the [facility] to get your lab work drawn.  

## 2023-03-05 NOTE — Progress Notes (Signed)
PCP: [facility] (last seen 08/23; returns 09/24) Primary Cardiologist: Yvonne Kendall, MD (last seen 01/24)  HPI:  Brad [name] is a 47 y/o male with a history of HTN, tobacco use, substance abuse, rapid atrial fibrillation and chronic heart failure.    Echo 01/13/21: EF of 60-65% along with severe LVH.  Echo 03/04/22: EF of 60-65% along with severe LVH.  Has not been admitted or been in the ED in the last 6 months.   He presents today with a chief complaint of a HF visit. He has no symptoms and specifically denies fatigue, SOB, cough, chest pain, palpitations, abdominal distention, pedal edema, dizziness, difficultly sleeping or weight gain. Weighing at home and weight has been around 267 pounds. Has been walking on the road at home for exercise 20-30 minutes every other day.  Spironolactone stopped since last visit due to worsening renal function.   ROS: All systems negative except as listed in HPI, PMH and Problem List.  SH:  Social History   Socioeconomic History   Marital status: Single    Spouse name: Not on file   Number of children: Not on file   Years of education: Not on file   Highest education level: Not on file  Occupational History   Not on file  Tobacco Use   Smoking status: Some Days    Current packs/day: 1.00    Types: Cigarettes   Smokeless tobacco: Never   Tobacco comments:    2-3 cigarettes   Vaping Use   Vaping status: Never Used  Substance and Sexual Activity   Alcohol use: Not Currently    Comment: occas   Drug use: Yes    Types: Marijuana, Cocaine    Comment: marijuana daily, cocaine only on really painful days   Sexual activity: Yes  Other Topics Concern   Not on file  Social History Narrative   Not on file   Social Determinants of Health   Financial Resource Strain: Not on file  Food Insecurity: Not on file  Transportation Needs: Not on file  Physical Activity: Not on file  Stress: Not on file  Social  Connections: Not on file  Intimate Partner Violence: Not on file    FH:  Family History  Problem Relation Age of Onset   Hypertension Mother     Past Medical History:  Diagnosis Date   Arrhythmia    atrial fibrillation   Asthma    CHF (congestive heart failure) (HCC)    CKD (chronic kidney disease), stage III (HCC)    Cocaine abuse (HCC)    Diverticulitis    Hypertension    Tobacco use disorder     Current Outpatient Medications  Medication Sig Dispense Refill   amLODipine (NORVASC) 10 MG tablet Take 1 tablet (10 mg total) by mouth daily. 90 tablet 3   apixaban (ELIQUIS) 5 MG TABS tablet Take 1 tablet (5 mg total) by mouth 2 (two) times daily. 60 tablet 5   aspirin EC 81 MG EC tablet Take 1 tablet (81 mg total) by mouth daily. Swallow whole. 30 tablet 11   atorvastatin (LIPITOR) 80 MG tablet Take 1 tablet (80 mg total) by mouth daily. 90 tablet 3   carvedilol (COREG) 6.25 MG tablet Take 1 tablet (6.25 mg total) by mouth 2 (two) times daily with a meal. 60 tablet 5   dapagliflozin propanediol (FARXIGA) 10 MG TABS tablet Take 1 tablet (10 mg total) by mouth daily before breakfast. 30 tablet 5  furosemide (LASIX) 40 MG tablet Take 1 tablet (40 mg total) by mouth once daily. 30 tablet 5   isosorbide mononitrate (IMDUR) 30 MG 24 hr tablet Take 1 tablet (30 mg total) by mouth once daily. 90 tablet 3   sacubitril-valsartan (ENTRESTO) 97-103 MG Take 1 tablet by mouth 2 (two) times daily. 180 tablet 3   spironolactone (ALDACTONE) 25 MG tablet Take 0.5 tablets (12.5 mg total) by mouth daily. 30 tablet 3   No current facility-administered medications for this visit.   Vitals:   03/05/23 1338  BP: (!) 155/97  Pulse: 80  SpO2: 95%  Weight: 267 lb (121.1 kg)  Height: 5\' 6"  (1.676 m)   Wt Readings from Last 3 Encounters:  03/05/23 267 lb (121.1 kg)  02/03/23 267 lb 6.4 oz (121.3 kg)  01/26/23 268 lb 6.4 oz (121.7 kg)   Lab Results  Component Value Date   CREATININE 2.50 (H)  02/25/2023   CREATININE 1.80 (H) 01/26/2023   CREATININE 1.77 (H) 05/13/2022   PHYSICAL EXAM:  General:  Well appearing. No resp difficulty HEENT: normal Neck: supple. JVP flat. No lymphadenopathy or thryomegaly appreciated. Cor: PMI normal. Regular rate & rhythm. No rubs, gallops or murmurs. Lungs: clear Abdomen: soft, nontender, nondistended. No hepatosplenomegaly. No bruits or masses.  Extremities: no cyanosis, clubbing, rash, edema Neuro: alert & oriented x3, cranial nerves grossly intact. Moves all 4 extremities w/o difficulty. Affect pleasant.   ECG: not done   ASSESSMENT & PLAN:  1: NICM with preserved ejection fraction- - likely HTN/ AF/ cocaine use - NYHA class I - euvolemic today - weighing daily; reminded to call for an overnight weight gain of > 2 pounds or a weekly weight gain of > 5 pounds - weight unchanged from last visit here 1 month ago - Echo 01/13/21: EF of 60-65% along with severe LVH.  - echo 03/04/22: EF of 60-65% along with severe LVH. - does not add salt to his food; reviewed the importance of reading food labels for sodium content - continue entresto 97/103mg  (getting this through [employer] patient assistance) - continue carvedilol 6.25mg  BID - continue farxiga 10mg  daily - continue furosemide 40mg  daily - spironolactone was stopped due to worsening renal function - BMP today - will need updated echo later this summer - BNP 03/04/22 was 713.0  2: HTN with CKD- - BP 155/97 - continue amlodipine 10mg  daily - consider renal ultrasound but he currently has no insurance coverage - saw PCP Sharon Seller) 08/23; has f/u 09/24 - BMP 02/25/23 reviewed and showed sodium 138, potassium 3.8, creatinine 2.50 and GFR 31  3: Tobacco use- - smoking 2 cigarettes daily & says that he's trying to quit - complete cessation discussed for 3 minutes with him  4: Cocaine abuse- - says that he occasionally uses cocaine when his back pain is so severe that he can't stand it -  last cocaine use was ~ 3-4 days ago  5: Atrial fibrillation- - saw cardiology ([facility]) 01/24 - continue apixaban 5mg  BID - continue ASA 81mg  daily - regular rate/ rhythm today  Return in 2 months, sooner if needed.

## 2023-04-28 ENCOUNTER — Ambulatory Visit: Payer: Self-pay | Admitting: Internal Medicine

## 2023-05-01 ENCOUNTER — Other Ambulatory Visit (HOSPITAL_COMMUNITY): Payer: Self-pay

## 2023-05-01 ENCOUNTER — Other Ambulatory Visit: Payer: Self-pay

## 2023-05-04 ENCOUNTER — Other Ambulatory Visit: Payer: Self-pay

## 2023-05-05 ENCOUNTER — Encounter: Payer: Self-pay | Admitting: Family

## 2023-05-05 ENCOUNTER — Other Ambulatory Visit: Payer: Self-pay

## 2023-05-06 ENCOUNTER — Encounter: Payer: Self-pay | Admitting: Internal Medicine

## 2023-05-07 ENCOUNTER — Encounter: Payer: Self-pay | Admitting: Family

## 2023-05-07 ENCOUNTER — Ambulatory Visit: Payer: Medicaid Other | Attending: Family | Admitting: Family

## 2023-05-07 ENCOUNTER — Other Ambulatory Visit: Payer: Self-pay

## 2023-05-07 VITALS — BP 172/118 | HR 66 | Resp 16 | Wt 278.0 lb

## 2023-05-07 DIAGNOSIS — I10 Essential (primary) hypertension: Secondary | ICD-10-CM

## 2023-05-07 DIAGNOSIS — I5030 Unspecified diastolic (congestive) heart failure: Secondary | ICD-10-CM | POA: Diagnosis not present

## 2023-05-07 DIAGNOSIS — F17210 Nicotine dependence, cigarettes, uncomplicated: Secondary | ICD-10-CM | POA: Insufficient documentation

## 2023-05-07 DIAGNOSIS — I5032 Chronic diastolic (congestive) heart failure: Secondary | ICD-10-CM

## 2023-05-07 DIAGNOSIS — I130 Hypertensive heart and chronic kidney disease with heart failure and stage 1 through stage 4 chronic kidney disease, or unspecified chronic kidney disease: Secondary | ICD-10-CM | POA: Diagnosis not present

## 2023-05-07 DIAGNOSIS — I428 Other cardiomyopathies: Secondary | ICD-10-CM | POA: Insufficient documentation

## 2023-05-07 DIAGNOSIS — N183 Chronic kidney disease, stage 3 unspecified: Secondary | ICD-10-CM | POA: Insufficient documentation

## 2023-05-07 DIAGNOSIS — I509 Heart failure, unspecified: Secondary | ICD-10-CM | POA: Diagnosis present

## 2023-05-07 DIAGNOSIS — I4891 Unspecified atrial fibrillation: Secondary | ICD-10-CM | POA: Insufficient documentation

## 2023-05-07 DIAGNOSIS — Z7901 Long term (current) use of anticoagulants: Secondary | ICD-10-CM | POA: Insufficient documentation

## 2023-05-07 DIAGNOSIS — Z720 Tobacco use: Secondary | ICD-10-CM

## 2023-05-07 DIAGNOSIS — I480 Paroxysmal atrial fibrillation: Secondary | ICD-10-CM

## 2023-05-07 DIAGNOSIS — F1410 Cocaine abuse, uncomplicated: Secondary | ICD-10-CM | POA: Insufficient documentation

## 2023-05-07 DIAGNOSIS — I110 Hypertensive heart disease with heart failure: Secondary | ICD-10-CM | POA: Diagnosis present

## 2023-05-07 MED ORDER — VALSARTAN 160 MG PO TABS
160.0000 mg | ORAL_TABLET | Freq: Every day | ORAL | 3 refills | Status: DC
  Filled 2023-05-07: qty 90, 90d supply, fill #0
  Filled 2023-08-04: qty 90, 90d supply, fill #1

## 2023-05-07 MED ORDER — RIVAROXABAN 15 MG PO TABS
15.0000 mg | ORAL_TABLET | Freq: Every day | ORAL | 3 refills | Status: DC
  Filled 2023-05-07: qty 30, 30d supply, fill #0
  Filled 2023-07-04 – 2023-07-06 (×2): qty 30, 30d supply, fill #1
  Filled 2023-08-04: qty 30, 30d supply, fill #2

## 2023-05-07 MED ORDER — METOPROLOL SUCCINATE ER 200 MG PO TB24
200.0000 mg | ORAL_TABLET | Freq: Every day | ORAL | 3 refills | Status: DC
  Filled 2023-05-07: qty 90, 90d supply, fill #0
  Filled 2023-08-04: qty 90, 90d supply, fill #1

## 2023-05-07 NOTE — Patient Instructions (Addendum)
DISCONTINUE ELIQUIS, COREG, AND ENTRESTO  START XARELTO 15 ONCE DAILY  START METOPROLOL 200 MG ONCE DAILY  START VALSARTAN 160 MG ONCE DAILY  [facility] - [phone] - [address] [place] [address]

## 2023-05-07 NOTE — Progress Notes (Signed)
PCP: [facility] (last seen 08/23; missed his appt on 09/24) Primary Cardiologist: End, Cristal Deer, MD (last seen 01/24)  HPI:  Mr Eckerson is a 47 y/o male with a history of HTN, tobacco use, substance abuse, rapid atrial fibrillation and chronic heart failure.    Has not been admitted or been in the ED in the last 6 months.   Echo 01/13/21: EF of 60-65% along with severe LVH.  Echo 03/04/22: EF of 60-65% along with severe LVH.  He presents today with a chief complaint of a HF visit. Currently denies any shortness of breath, fatigue, chest pain, cough, palpitations, abdominal distention, pedal edema, dizziness or weight gain.   Says that he missed his PCP appt because his mom changed her phone number and that's the number listed as his contact. He is asking if there's anyone local that he can see so he doesn't have to go to [place]. He admits that he hasn't taken his meds in the last couple of days (although has them all) and that when he does take them, he only takes them once daily and not twice daily. Just took his meds while he was sitting in the exam room. No particular reason why he's so inconsistent with his meds.   ROS: All systems negative except as listed in HPI, PMH and Problem List.  SH:  Social History   Socioeconomic History   Marital status: Single    Spouse name: Not on file   Number of children: Not on file   Years of education: Not on file   Highest education level: Not on file  Occupational History   Not on file  Tobacco Use   Smoking status: Some Days    Current packs/day: 1.00    Types: Cigarettes   Smokeless tobacco: Never   Tobacco comments:    2-3 cigarettes   Vaping Use   Vaping status: Never Used  Substance and Sexual Activity   Alcohol use: Not Currently    Comment: occas   Drug use: Yes    Types: Marijuana, Cocaine    Comment: marijuana daily, cocaine only on really painful days   Sexual activity: Yes  Other Topics Concern    Not on file  Social History Narrative   Not on file   Social Determinants of Health   Financial Resource Strain: Not on file  Food Insecurity: Not on file  Transportation Needs: Not on file  Physical Activity: Not on file  Stress: Not on file  Social Connections: Not on file  Intimate Partner Violence: Not on file    FH:  Family History  Problem Relation Age of Onset   Hypertension Mother     Past Medical History:  Diagnosis Date   Arrhythmia    atrial fibrillation   Asthma    CHF (congestive heart failure) (HCC)    CKD (chronic kidney disease), stage III (HCC)    Cocaine abuse (HCC)    Diverticulitis    Hypertension    Tobacco use disorder     Current Outpatient Medications  Medication Sig Dispense Refill   amLODipine (NORVASC) 10 MG tablet Take 1 tablet (10 mg total) by mouth daily. 90 tablet 3   apixaban (ELIQUIS) 5 MG TABS tablet Take 1 tablet (5 mg total) by mouth 2 (two) times daily. 60 tablet 5   aspirin EC 81 MG EC tablet Take 1 tablet (81 mg total) by mouth daily. Swallow whole. 30 tablet 11   atorvastatin (LIPITOR) 80  MG tablet Take 1 tablet (80 mg total) by mouth daily. 90 tablet 3   carvedilol (COREG) 6.25 MG tablet Take 1 tablet (6.25 mg total) by mouth 2 (two) times daily with a meal. 60 tablet 5   dapagliflozin propanediol (FARXIGA) 10 MG TABS tablet Take 1 tablet (10 mg total) by mouth daily before breakfast. 30 tablet 5   furosemide (LASIX) 40 MG tablet Take 1 tablet (40 mg total) by mouth once daily. 30 tablet 5   isosorbide mononitrate (IMDUR) 30 MG 24 hr tablet Take 1 tablet (30 mg total) by mouth once daily. 90 tablet 3   sacubitril-valsartan (ENTRESTO) 97-103 MG Take 1 tablet by mouth 2 (two) times daily. 180 tablet 3   spironolactone (ALDACTONE) 25 MG tablet Take 0.5 tablets (12.5 mg total) by mouth daily. 30 tablet 3   No current facility-administered medications for this visit.   Vitals:   05/07/23 1407  BP: (!) 172/118  Pulse: 66  Resp:  16  SpO2: 98%  Weight: 278 lb (126.1 kg)   Wt Readings from Last 3 Encounters:  05/07/23 278 lb (126.1 kg)  03/05/23 267 lb (121.1 kg)  02/03/23 267 lb 6.4 oz (121.3 kg)   Lab Results  Component Value Date   CREATININE 2.25 (H) 03/05/2023   CREATININE 2.50 (H) 02/25/2023   CREATININE 1.80 (H) 01/26/2023   PHYSICAL EXAM:  General:  Well appearing. No resp difficulty HEENT: normal Neck: supple. JVP flat. No lymphadenopathy or thryomegaly appreciated. Cor: PMI normal. Regular rate & rhythm. No rubs, gallops or murmurs. Lungs: clear Abdomen: soft, nontender, nondistended. No hepatosplenomegaly. No bruits or masses.  Extremities: no cyanosis, clubbing, rash, edema Neuro: alert & oriented x3, cranial nerves grossly intact. Moves all 4 extremities w/o difficulty. Affect pleasant.   ECG: not done   ASSESSMENT & PLAN:  1: NICM with preserved ejection fraction- - likely HTN/ AF/ cocaine use - NYHA class I - euvolemic today - weighing daily; reminded to call for an overnight weight gain of > 2 pounds or a weekly weight gain of > 5 pounds - weight up 11 pounds from last visit here 2 months ago - Echo 01/13/21: EF of 60-65% along with severe LVH.  - echo 03/04/22: EF of 60-65% along with severe LVH. - would like to get updated echo if he's able to get insurance - does not add salt to his food; reviewed the importance of reading food labels for sodium content - due to compliance with BID meds will make changes - stop entresto and begin valsartan 160mg  daily (may need to titrate to 320mg ) - stop carvedilol and begin metoprolol 200mg  daily - continue farxiga 10mg  daily - continue furosemide 40mg  daily - spironolactone was stopped due to worsening renal function - BMP next visit due to med changes-  - BNP 03/04/22 was 713.0  2: HTN with CKD- - BP 172/118 but he's inconsistent with taking his meds; making changes per above - continue amlodipine 10mg  daily - consider renal ultrasound  but he currently has no insurance coverage - saw PCP Sharon Seller) 08/23; has f/u 09/24 - BMP 03/05/23 reviewed and showed sodium 140, potassium 3.8, creatinine 2.25 and GFR 35 - [facility] packet and contact number provided to him today  3: Tobacco use- - smoking 2 cigarettes daily & says that he's trying to quit - complete cessation discussed for 3 minutes with him  4: Cocaine abuse- - says that he occasionally uses cocaine when his back pain is so severe  that he can't stand it - last cocaine use was ~ 3-4 days ago  5: Atrial fibrillation- - saw cardiology ([facility]) 01/24 - stop apixaban and begin xarelto 15mg  once daily - continue ASA 81mg  daily - regular rate/ rhythm today  Return in 1 month, sooner if needed. Emphasized the importance of taking all his medications every day.

## 2023-05-08 ENCOUNTER — Other Ambulatory Visit: Payer: Self-pay

## 2023-05-11 ENCOUNTER — Other Ambulatory Visit: Payer: Self-pay

## 2023-05-13 ENCOUNTER — Other Ambulatory Visit: Payer: Self-pay

## 2023-05-14 ENCOUNTER — Other Ambulatory Visit: Payer: Self-pay

## 2023-05-19 ENCOUNTER — Other Ambulatory Visit: Payer: Self-pay

## 2023-05-28 ENCOUNTER — Other Ambulatory Visit: Payer: Self-pay

## 2023-05-28 ENCOUNTER — Ambulatory Visit: Payer: Self-pay | Admitting: Gerontology

## 2023-05-28 VITALS — BP 151/99 | HR 68 | Temp 98.7°F | Ht 68.0 in | Wt 283.7 lb

## 2023-05-28 DIAGNOSIS — Z7689 Persons encountering health services in other specified circumstances: Secondary | ICD-10-CM | POA: Insufficient documentation

## 2023-05-28 DIAGNOSIS — I10 Essential (primary) hypertension: Secondary | ICD-10-CM

## 2023-05-28 DIAGNOSIS — N1831 Chronic kidney disease, stage 3a: Secondary | ICD-10-CM

## 2023-05-28 NOTE — Patient Instructions (Signed)

## 2023-05-28 NOTE — Progress Notes (Signed)
New Patient Office Visit  Subjective    Patient ID: Brad Nicodemus., male    DOB: [birth year]  Age: 47 y.o. MRN: [id number]  CC:  Chief Complaint  Patient presents with   Establish Care    HPI Brad [name].  Is 47 y/o male  who has history of HTN, tobacco use, substance abuse, rapid atrial fibrillation and chronic heart failure. presents to establish care. He [facility] by Clarisa Kindred FNP on 05/07/23. He states that he is compliant with his medications, denies side effects and continues to make healthy lifestyle changes. He reports checking his blood pressure at home and weighs himself daily. His Serum creatinine checked on 03/05/23 was 2.25 mg/dl and eGFR was 35, he denies chest pain, palpitation, shortness of breath, peripheral edema and vision changes. He was nauseous and vomitted at the clinic during visit, he states that he ate too much, came  to his appointment from a restaurant and had lots of water to drink. He states that he usually vomits after consuming excess food. He denies abdominal pain, fever and chills. Overall, he states that he's doing well and offers no further complaint.  Outpatient Encounter Medications as of 05/28/2023  Medication Sig   amLODipine (NORVASC) 10 MG tablet Take 1 tablet (10 mg total) by mouth daily.   aspirin EC 81 MG EC tablet Take 1 tablet (81 mg total) by mouth daily. Swallow whole.   atorvastatin (LIPITOR) 80 MG tablet Take 1 tablet (80 mg total) by mouth daily.   dapagliflozin propanediol (FARXIGA) 10 MG TABS tablet Take 1 tablet (10 mg total) by mouth daily before breakfast.   furosemide (LASIX) 40 MG tablet Take 1 tablet (40 mg total) by mouth once daily.   isosorbide mononitrate (IMDUR) 30 MG 24 hr tablet Take 1 tablet (30 mg total) by mouth once daily.   metoprolol (TOPROL XL) 200 MG 24 hr tablet Take 1 tablet (200 mg total) by mouth daily.   Rivaroxaban (XARELTO) 15 MG TABS tablet Take 1 tablet (15 mg total)  by mouth daily.   sacubitril-valsartan (ENTRESTO) 97-103 MG Take 1 tablet by mouth 2 (two) times daily.   spironolactone (ALDACTONE) 25 MG tablet Take 0.5 tablets (12.5 mg total) by mouth daily.   valsartan (DIOVAN) 160 MG tablet Take 1 tablet (160 mg total) by mouth daily.   No facility-administered encounter medications on file as of 05/28/2023.    Past Medical History:  Diagnosis Date   Arrhythmia    atrial fibrillation   Asthma    CHF (congestive heart failure) (HCC)    CKD (chronic kidney disease), stage III (HCC)    Cocaine abuse (HCC)    Diverticulitis    Hypertension    Tobacco use disorder     Past Surgical History:  Procedure Laterality Date   ANKLE SURGERY      Family History  Problem Relation Age of Onset   Hypertension Mother     Social History   Socioeconomic History   Marital status: Single    Spouse name: Not on file   Number of children: Not on file   Years of education: Not on file   Highest education level: Not on file  Occupational History   Not on file  Tobacco Use   Smoking status: Some Days    Current packs/day: 1.00    Types: Cigarettes   Smokeless tobacco: Never   Tobacco comments:    2-3 cigarettes  Vaping Use   Vaping status: Never Used  Substance and Sexual Activity   Alcohol use: Not Currently    Comment: occas   Drug use: Yes    Types: Marijuana, Cocaine    Comment: marijuana daily, cocaine only on really painful days   Sexual activity: Yes  Other Topics Concern   Not on file  Social History Narrative   Not on file   Social Determinants of Health   Financial Resource Strain: Not on file  Food Insecurity: Food Insecurity Present (05/28/2023)   Hunger Vital Sign    Worried About Running Out of Food in the Last Year: Sometimes true    Ran Out of Food in the Last Year: Sometimes true  Transportation Needs: Not on file  Physical Activity: Not on file  Stress: Not on file  Social Connections: Not on file  Intimate Partner  Violence: Not on file    Review of Systems  Constitutional: Negative.   HENT: Negative.    Eyes: Negative.   Respiratory: Negative.    Cardiovascular: Negative.   Gastrointestinal:  Positive for nausea and vomiting. Negative for abdominal pain.  Genitourinary: Negative.   Musculoskeletal: Negative.   Skin: Negative.   Neurological: Negative.   Endo/Heme/Allergies: Negative.   Psychiatric/Behavioral: Negative.          Objective    BP (!) 151/99 (BP Location: Right Arm, Patient Position: Sitting)   Pulse 68   Temp 98.7 F (37.1 C)   Ht 5\' 8"  (1.727 m)   Wt 283 lb 11.2 oz (128.7 kg)   SpO2 93%   BMI 43.14 kg/m   Physical Exam HENT:     Head: Normocephalic and atraumatic.     Nose: Nose normal.     Mouth/Throat:     Mouth: Mucous membranes are moist.  Eyes:     Extraocular Movements: Extraocular movements intact.     Conjunctiva/sclera: Conjunctivae normal.     Pupils: Pupils are equal, round, and reactive to light.  Cardiovascular:     Rate and Rhythm: Normal rate and regular rhythm.     Pulses: Normal pulses.     Heart sounds: Normal heart sounds.  Pulmonary:     Effort: Pulmonary effort is normal.     Breath sounds: Normal breath sounds.  Abdominal:     General: Bowel sounds are normal.     Palpations: Abdomen is soft.  Genitourinary:    Comments: Deferred per patient Musculoskeletal:        General: Normal range of motion.     Cervical back: Normal range of motion.  Skin:    General: Skin is warm.     Capillary Refill: Capillary refill takes less than 2 seconds.  Neurological:     General: No focal deficit present.     Mental Status: He is alert and oriented to person, place, and time. Mental status is at baseline.  Psychiatric:        Mood and Affect: Mood normal.        Behavior: Behavior normal.        Thought Content: Thought content normal.        Judgment: Judgment normal.         Assessment & Plan:   1. Encounter to establish  care - Routine labs will be checked - CBC w/Diff; Future - Comp Met (CMET); Future - Lipid panel; Future - HgB A1c; Future - Microalbumin / creatinine urine ratio; Future  2. Hypertensive kidney disease with stage 3a  chronic kidney disease (HCC) - His Serum creatinine was elevated, will recheck renal function and will refer to [facility] - Ambulatory referral to Nephrology  3. Essential hypertension - His blood pressure is not under control, his goal should be less than 130/80. He will continue on his current medication, DASH diet and exercise as tolerated.   Return in about 2 weeks (around 06/11/2023), or if symptoms worsen or fail to improve.   Baer Hinton Trellis Paganini, NP

## 2023-05-31 ENCOUNTER — Other Ambulatory Visit: Payer: Self-pay | Admitting: Family

## 2023-05-31 ENCOUNTER — Other Ambulatory Visit: Payer: Self-pay

## 2023-06-01 ENCOUNTER — Other Ambulatory Visit: Payer: Self-pay

## 2023-06-01 MED FILL — Isosorbide Mononitrate Tab ER 24HR 30 MG: ORAL | 90 days supply | Qty: 90 | Fill #0 | Status: AC

## 2023-06-02 ENCOUNTER — Other Ambulatory Visit: Payer: Self-pay

## 2023-06-10 ENCOUNTER — Ambulatory Visit: Payer: Self-pay | Admitting: Gerontology

## 2023-06-10 ENCOUNTER — Encounter: Payer: Self-pay | Admitting: Gerontology

## 2023-06-10 VITALS — BP 151/94 | HR 101 | Ht 68.0 in | Wt 282.0 lb

## 2023-06-10 DIAGNOSIS — I5032 Chronic diastolic (congestive) heart failure: Secondary | ICD-10-CM

## 2023-06-10 DIAGNOSIS — Z7689 Persons encountering health services in other specified circumstances: Secondary | ICD-10-CM

## 2023-06-10 DIAGNOSIS — I129 Hypertensive chronic kidney disease with stage 1 through stage 4 chronic kidney disease, or unspecified chronic kidney disease: Secondary | ICD-10-CM

## 2023-06-10 NOTE — Patient Instructions (Signed)

## 2023-06-10 NOTE — Progress Notes (Signed)
Established Patient Office Visit  Subjective   Patient ID: Brad [name]., male    DOB: November 25, 1975  Age: 47 y.o. MRN: [id number]  Chief Complaint  Patient presents with   Follow-up    HPI Scipio Pressnall.  Is 47 y/o male  who has history of HTN, tobacco use, substance abuse, rapid atrial fibrillation and chronic heart failure. presents to the clinic for follow up.  He states that he is compliant with his medications, denies side effects and continues to make healthy lifestyle changes. His blood pressure was elevated during visit at 178/113, he reports checking his blood pressure at home and weighs himself most days, but does not adhere to DASH diet.  He states that the average reading is between 150/90 mmHg - 155/95 mmHg. His blood pressure was 151/94 when it was rechecked. He denies chest pain, palpitation, shortness of breath, peripheral edema and vision changes. Overall, he states that he's doing well and offers no further complaint.  Patient Active Problem List   Diagnosis Date Noted   Encounter to establish care 05/28/2023   Demand ischemia (HCC) 03/21/2022   Chronic heart failure with preserved ejection fraction (HFpEF) (HCC) 03/21/2022   Polysubstance abuse (HCC) 03/21/2022   NSTEMI (non-ST elevated myocardial infarction) [facility])    Cardiogenic shock (HCC) 03/04/2022   New onset atrial fibrillation (HCC)    Chronic pain of right ankle 05/10/2021   Morbid obesity (HCC) 04/10/2021   Hypertensive emergency 04/10/2021   Acute on chronic diastolic CHF (congestive heart failure) (HCC) 02/15/2021   Hypertensive kidney disease with stage 3a chronic kidney disease (HCC) 02/15/2021   Type 2 diabetes mellitus with morbid obesity (HCC) 02/15/2021   Tobacco dependence 02/15/2021   Cocaine abuse in remission (HCC) 02/15/2021   Marijuana user 02/15/2021   Acute pulmonary edema (HCC)    Acute heart failure (HCC) 01/12/2021   Asthma    Essential hypertension    Cocaine abuse (HCC)  04/17/2020   Pneumonia due to COVID-19 virus 04/16/2020   Acute hypoxemic respiratory failure due to COVID-19 (HCC) 04/16/2020   Benign essential HTN 04/16/2020   CKD (chronic kidney disease), stage IIIa 04/16/2020   Elevated troponin 04/16/2020   Acute renal failure superimposed on stage 3a chronic kidney disease (HCC)    Diverticulitis 10/06/2018   Hypertensive urgency 04/18/2016   Intractable nausea and vomiting 05/31/2015   Past Medical History:  Diagnosis Date   Arrhythmia    atrial fibrillation   Asthma    CHF (congestive heart failure) (HCC)    CKD (chronic kidney disease), stage III (HCC)    Cocaine abuse (HCC)    Diverticulitis    Hypertension    Tobacco use disorder    Past Surgical History:  Procedure Laterality Date   ANKLE SURGERY     Social History   Tobacco Use   Smoking status: Some Days    Current packs/day: 1.00    Types: Cigarettes   Smokeless tobacco: Never   Tobacco comments:    2-3 cigarettes   Vaping Use   Vaping status: Never Used  Substance Use Topics   Alcohol use: Not Currently    Comment: occas   Drug use: Yes    Types: Marijuana, Cocaine    Comment: marijuana daily, cocaine only on really painful days   Social History   Socioeconomic History   Marital status: Single    Spouse name: Not on file   Number of children: Not on file   Years of  education: Not on file   Highest education level: Not on file  Occupational History   Not on file  Tobacco Use   Smoking status: Some Days    Current packs/day: 1.00    Types: Cigarettes   Smokeless tobacco: Never   Tobacco comments:    2-3 cigarettes   Vaping Use   Vaping status: Never Used  Substance and Sexual Activity   Alcohol use: Not Currently    Comment: occas   Drug use: Yes    Types: Marijuana, Cocaine    Comment: marijuana daily, cocaine only on really painful days   Sexual activity: Yes  Other Topics Concern   Not on file  Social History Narrative   Not on file    Social Determinants of Health   Financial Resource Strain: Medium Risk (06/10/2023)   Overall Financial Resource Strain (CARDIA)    Difficulty of Paying Living Expenses: Somewhat hard  Food Insecurity: Food Insecurity Present (06/10/2023)   Hunger Vital Sign    Worried About Running Out of Food in the Last Year: Often true    Ran Out of Food in the Last Year: Sometimes true  Transportation Needs: Unmet Transportation Needs (06/10/2023)   PRAPARE - Administrator, Civil Service (Medical): Yes    Lack of Transportation (Non-Medical): Yes  Physical Activity: Insufficiently Active (06/10/2023)   Exercise Vital Sign    Days of Exercise per Week: 3 days    Minutes of Exercise per Session: 30 min  Stress: Stress Concern Present (06/10/2023)   [employer] of Occupational Health - Occupational Stress Questionnaire    Feeling of Stress : To some extent  Social Connections: Socially Isolated (06/10/2023)   Social Connection and Isolation Panel [NHANES]    Frequency of Communication with Friends and Family: More than three times a week    Frequency of Social Gatherings with Friends and Family: Never    Attends Religious Services: Never    Database administrator or Organizations: No    Attends Banker Meetings: Never    Marital Status: Never married  Intimate Partner Violence: Not At Risk (06/10/2023)   Humiliation, Afraid, Rape, and Kick questionnaire    Fear of Current or Ex-Partner: No    Emotionally Abused: No    Physically Abused: No    Sexually Abused: No   Family Status  Relation Name Status   Mother  Alive   Father  Deceased  No partnership data on file   Family History  Problem Relation Age of Onset   Hypertension Mother    No Known Allergies    Review of Systems  Constitutional: Negative.   HENT: Negative.    Eyes: Negative.   Respiratory: Negative.    Cardiovascular: Negative.   Gastrointestinal: Negative.   Genitourinary:  Negative.   Musculoskeletal: Negative.   Skin: Negative.   Neurological: Negative.   Endo/Heme/Allergies: Negative.   Psychiatric/Behavioral: Negative.        Objective:     BP (!) 151/94   Pulse (!) 101   Ht 5\' 8"  (1.727 m)   Wt 282 lb (127.9 kg)   SpO2 95%   BMI 42.88 kg/m  BP Readings from Last 3 Encounters:  06/10/23 (!) 151/94  05/28/23 (!) 151/99  05/07/23 (!) 172/118   Wt Readings from Last 3 Encounters:  06/10/23 282 lb (127.9 kg)  05/28/23 283 lb 11.2 oz (128.7 kg)  05/07/23 278 lb (126.1 kg)      Physical Exam Constitutional:  Appearance: Normal appearance.  HENT:     Head: Normocephalic.  Eyes:     Pupils: Pupils are equal, round, and reactive to light.  Cardiovascular:     Rate and Rhythm: Normal rate and regular rhythm.     Pulses: Normal pulses.     Heart sounds: Normal heart sounds.  Pulmonary:     Effort: Pulmonary effort is normal.     Breath sounds: Normal breath sounds.  Musculoskeletal:        General: Normal range of motion.  Skin:    General: Skin is warm and dry.  Neurological:     General: No focal deficit present.     Mental Status: He is alert.  Psychiatric:        Mood and Affect: Mood normal.        Behavior: Behavior normal.        Thought Content: Thought content normal.        Judgment: Judgment normal.      No results found for any visits on 06/10/23.  Last CBC Lab Results  Component Value Date   WBC 7.5 03/06/2022   HGB 14.4 03/06/2022   HCT 45.1 03/06/2022   MCV 81.0 03/06/2022   MCH 25.9 (L) 03/06/2022   RDW 15.6 (H) 03/06/2022   PLT 279 03/06/2022   Last metabolic panel Lab Results  Component Value Date   GLUCOSE 131 (H) 03/05/2023   NA 140 03/05/2023   K 3.8 03/05/2023   CL 106 03/05/2023   CO2 24 03/05/2023   BUN 40 (H) 03/05/2023   CREATININE 2.25 (H) 03/05/2023   GFRNONAA 35 (L) 03/05/2023   CALCIUM 9.2 03/05/2023   PHOS 3.6 03/04/2022   PROT 8.1 03/04/2022   ALBUMIN 4.1 03/04/2022    BILITOT 1.4 (H) 03/04/2022   ALKPHOS 56 03/04/2022   AST 24 03/04/2022   ALT 12 03/04/2022   ANIONGAP 10 03/05/2023   Last lipids Lab Results  Component Value Date   CHOL 239 (H) 01/31/2022   HDL 52 01/31/2022   LDLCALC 169 (H) 01/31/2022   TRIG 103 01/31/2022   CHOLHDL 4.6 01/31/2022   Last hemoglobin A1c Lab Results  Component Value Date   HGBA1C 6.2 (H) 03/04/2022   Last thyroid functions Lab Results  Component Value Date   TSH 2.710 03/04/2022   Last vitamin D No results found for: "25OHVITD2", "25OHVITD3", "VD25OH" Last vitamin B12 and Folate No results found for: "VITAMINB12", "FOLATE"    The ASCVD Risk score (Arnett DK, et al., 2019) failed to calculate for the following reasons:   The patient has a prior MI or stroke diagnosis    Assessment & Plan:  1. Encounter to establish care -Routine labs will be checked - Microalbumin / creatinine urine ratio - HgB A1c - Lipid panel - Comp Met (CMET) - CBC w/Diff  2. Chronic diastolic congestive heart failure (HCC) - He will continue on current medication, encouraged to check blood pressure  and weight daily  - Monitor fluid intake - Encouraged to continue on low sodium diet - Exercise as tolerated , advised on smoking cessation -  Will check Basic metabolic panel prior to next visit.  3. Hypertensive kidney disease with stage 3a chronic kidney disease (HCC) -Will recheck his renal function and will refer to Nephrology - Ambulatory referral to Nephrology     Return in about 1 week (around 06/17/2023).    Lutricia Horsfall, RN

## 2023-06-11 LAB — COMPREHENSIVE METABOLIC PANEL
ALT: 18 [IU]/L (ref 0–44)
AST: 20 [IU]/L (ref 0–40)
Albumin: 4.2 g/dL (ref 4.1–5.1)
Alkaline Phosphatase: 85 [IU]/L (ref 44–121)
BUN/Creatinine Ratio: 19 (ref 9–20)
BUN: 50 mg/dL — ABNORMAL HIGH (ref 6–24)
Bilirubin Total: 0.2 mg/dL (ref 0.0–1.2)
CO2: 21 mmol/L (ref 20–29)
Calcium: 9.4 mg/dL (ref 8.7–10.2)
Chloride: 103 mmol/L (ref 96–106)
Creatinine, Ser: 2.61 mg/dL — ABNORMAL HIGH (ref 0.76–1.27)
Globulin, Total: 2.8 g/dL (ref 1.5–4.5)
Glucose: 145 mg/dL — ABNORMAL HIGH (ref 70–99)
Potassium: 5.1 mmol/L (ref 3.5–5.2)
Sodium: 142 mmol/L (ref 134–144)
Total Protein: 7 g/dL (ref 6.0–8.5)
eGFR: 30 mL/min/{1.73_m2} — ABNORMAL LOW (ref >59)

## 2023-06-11 LAB — LIPID PANEL
Chol/HDL Ratio: 3.2 {ratio} (ref 0.0–5.0)
Cholesterol, Total: 162 mg/dL (ref 100–199)
HDL: 50 mg/dL (ref >39)
LDL Chol Calc (NIH): 90 mg/dL (ref 0–99)
Triglycerides: 124 mg/dL (ref 0–149)
VLDL Cholesterol Cal: 22 mg/dL (ref 5–40)

## 2023-06-11 LAB — CBC WITH DIFFERENTIAL/PLATELET
Basophils Absolute: 0.1 10*3/uL (ref 0.0–0.2)
Basos: 1 %
EOS (ABSOLUTE): 0.3 10*3/uL (ref 0.0–0.4)
Eos: 3 %
Hematocrit: 50.2 % (ref 37.5–51.0)
Hemoglobin: 16.5 g/dL (ref 13.0–17.7)
Immature Grans (Abs): 0 10*3/uL (ref 0.0–0.1)
Immature Granulocytes: 0 %
Lymphocytes Absolute: 2.7 10*3/uL (ref 0.7–3.1)
Lymphs: 35 %
MCH: 26.7 pg (ref 26.6–33.0)
MCHC: 32.9 g/dL (ref 31.5–35.7)
MCV: 81 fL (ref 79–97)
Monocytes Absolute: 0.7 10*3/uL (ref 0.1–0.9)
Monocytes: 9 %
Neutrophils Absolute: 3.9 10*3/uL (ref 1.4–7.0)
Neutrophils: 52 %
Platelets: 246 10*3/uL (ref 150–450)
RBC: 6.18 x10E6/uL — ABNORMAL HIGH (ref 4.14–5.80)
RDW: 15.7 % — ABNORMAL HIGH (ref 11.6–15.4)
WBC: 7.6 10*3/uL (ref 3.4–10.8)

## 2023-06-11 LAB — HEMOGLOBIN A1C
Est. average glucose Bld gHb Est-mCnc: 137 mg/dL
Hgb A1c MFr Bld: 6.4 % — ABNORMAL HIGH (ref 4.8–5.6)

## 2023-06-11 LAB — MICROALBUMIN / CREATININE URINE RATIO
Creatinine, Urine: 23.1 mg/dL
Microalb/Creat Ratio: 842 mg/g{creat} — ABNORMAL HIGH (ref 0–29)
Microalbumin, Urine: 194.4 ug/mL

## 2023-06-11 NOTE — Progress Notes (Signed)
PCP: Eulogio Bear, NP @ [facility] (last seen 10/24) Primary Cardiologist: End, Cristal Deer, MD (last seen 01/24)  HPI:  Brad [name] is a 47 y/o male with a history of HTN, tobacco use, substance abuse, rapid atrial fibrillation and chronic heart failure.    Has not been admitted or been in the ED in the last 6 months.   Echo 04/19/16: EF 55-65% with mild LVH Echo 01/13/21: EF 60-65% along with severe LVH.  Echo 03/04/22: EF 60-65% along with severe LVH.  He presents today with a chief complaint of minimal shortness of breath with moderate exertion. [place] track and that's when he notices the SOB. Has no other complaints and specifically denies chest pain, palpitations, cough, fatigue, edema, abdominal distention, dizziness or weight gain. Reports sleeping well although tends to sleep more during the day than at nighttime.   At last visit, entresto was changed to valsartan 160mg  daily & carvedilol changed to metoprolol 200mg  daily & eliquis changed to xarelto 15mg  daily (for compliance reasons). He has continued to take both entresto and valsartan and had resumed his spironolactone but at 25mg  daily (was previously taking 12.5mg  prior to it being stopped)  ROS: All systems negative except as listed in HPI, PMH and Problem List.  SH:  Social History   Socioeconomic History   Marital status: Single    Spouse name: Not on file   Number of children: Not on file   Years of education: Not on file   Highest education level: Not on file  Occupational History   Not on file  Tobacco Use   Smoking status: Some Days    Current packs/day: 1.00    Types: Cigarettes   Smokeless tobacco: Never   Tobacco comments:    2-3 cigarettes   Vaping Use   Vaping status: Never Used  Substance and Sexual Activity   Alcohol use: Not Currently    Comment: occas   Drug use: Yes    Types: Marijuana, Cocaine    Comment: marijuana daily, cocaine only on really painful  days   Sexual activity: Yes  Other Topics Concern   Not on file  Social History Narrative   Not on file   Social Determinants of Health   Financial Resource Strain: Medium Risk (06/10/2023)   Overall Financial Resource Strain (CARDIA)    Difficulty of Paying Living Expenses: Somewhat hard  Food Insecurity: Food Insecurity Present (06/10/2023)   Hunger Vital Sign    Worried About Running Out of Food in the Last Year: Often true    Ran Out of Food in the Last Year: Sometimes true  Transportation Needs: Unmet Transportation Needs (06/10/2023)   PRAPARE - Administrator, Civil Service (Medical): Yes    Lack of Transportation (Non-Medical): Yes  Physical Activity: Insufficiently Active (06/10/2023)   Exercise Vital Sign    Days of Exercise per Week: 3 days    Minutes of Exercise per Session: 30 min  Stress: Stress Concern Present (06/10/2023)   [employer] of Occupational Health - Occupational Stress Questionnaire    Feeling of Stress : To some extent  Social Connections: Socially Isolated (06/10/2023)   Social Connection and Isolation Panel [NHANES]    Frequency of Communication with Friends and Family: More than three times a week    Frequency of Social Gatherings with Friends and Family: Never    Attends Religious Services: Never    Database administrator or Organizations: No  Attends Banker Meetings: Never    Marital Status: Never married  Intimate Partner Violence: Not At Risk (06/10/2023)   Humiliation, Afraid, Rape, and Kick questionnaire    Fear of Current or Ex-Partner: No    Emotionally Abused: No    Physically Abused: No    Sexually Abused: No    FH:  Family History  Problem Relation Age of Onset   Hypertension Mother     Past Medical History:  Diagnosis Date   Arrhythmia    atrial fibrillation   Asthma    CHF (congestive heart failure) (HCC)    CKD (chronic kidney disease), stage III (HCC)    Cocaine abuse (HCC)     Diverticulitis    Hypertension    Tobacco use disorder     Current Outpatient Medications  Medication Sig Dispense Refill   amLODipine (NORVASC) 10 MG tablet Take 1 tablet (10 mg total) by mouth daily. 90 tablet 3   aspirin EC 81 MG EC tablet Take 1 tablet (81 mg total) by mouth daily. Swallow whole. 30 tablet 11   atorvastatin (LIPITOR) 80 MG tablet Take 1 tablet (80 mg total) by mouth daily. 90 tablet 3   dapagliflozin propanediol (FARXIGA) 10 MG TABS tablet Take 1 tablet (10 mg total) by mouth daily before breakfast. 30 tablet 5   furosemide (LASIX) 40 MG tablet Take 1 tablet (40 mg total) by mouth once daily. 30 tablet 5   isosorbide mononitrate (IMDUR) 30 MG 24 hr tablet Take 1 tablet (30 mg total) by mouth once daily. 90 tablet 3   metoprolol (TOPROL XL) 200 MG 24 hr tablet Take 1 tablet (200 mg total) by mouth daily. 90 tablet 3   Rivaroxaban (XARELTO) 15 MG TABS tablet Take 1 tablet (15 mg total) by mouth daily. 90 tablet 3   sacubitril-valsartan (ENTRESTO) 97-103 MG Take 1 tablet by mouth 2 (two) times daily.     spironolactone (ALDACTONE) 25 MG tablet Take 0.5 tablets (12.5 mg total) by mouth daily. 30 tablet 3   valsartan (DIOVAN) 160 MG tablet Take 1 tablet (160 mg total) by mouth daily. 90 tablet 3   No current facility-administered medications for this visit.   Vitals:   06/12/23 1437  BP: (!) 157/98  Pulse: 65  SpO2: 96%  Weight: 281 lb (127.5 kg)   Wt Readings from Last 3 Encounters:  06/12/23 281 lb (127.5 kg)  06/10/23 282 lb (127.9 kg)  05/28/23 283 lb 11.2 oz (128.7 kg)   Lab Results  Component Value Date   CREATININE 2.61 (H) 06/10/2023   CREATININE 2.25 (H) 03/05/2023   CREATININE 2.50 (H) 02/25/2023   PHYSICAL EXAM:  General:  Well appearing. No resp difficulty HEENT: normal Neck: supple. JVP flat. No lymphadenopathy or thryomegaly appreciated. Cor: PMI normal. Regular rate & rhythm. No rubs, gallops or murmurs. Lungs: clear Abdomen: soft,  nontender, nondistended. No hepatosplenomegaly. No bruits or masses.  Extremities: no cyanosis, clubbing, rash, edema Neuro: alert & oriented x3, cranial nerves grossly intact. Moves all 4 extremities w/o difficulty. Affect pleasant.   ECG: not done   ASSESSMENT & PLAN:  1: NICM with preserved ejection fraction- - likely HTN/ AF/ cocaine use - NYHA class II - euvolemic today - weighing daily; reminded to call for an overnight weight gain of > 2 pounds or a weekly weight gain of > 5 pounds - weight up 3 pounds from last visit here 1 month ago - Echo 04/19/16: EF 55-65% with mild LVH -  Echo 01/13/21: EF of 60-65% along with severe LVH.  - echo 03/04/22: EF of 60-65% along with severe LVH. - would like to get updated echo if he's able to get insurance - does not add salt to his food; reviewed the importance of reading food labels for sodium content - continue valsartan 160mg  daily; stop entresto as he's been taking this with the valsartan(took bottle to be destroyed by pharmacy)  - continue metoprolol 200mg  daily - continue farxiga 10mg  daily - continue furosemide 40mg  daily - decrease spironolactone to 12.5mg  daily (he's been taking 25mg  daily; was previously discontinued) - had labs done 2 days ago with PCP and renal is worsening and potassium rising; med changes per above and will ask PCP to recheck BMET next week - BNP 03/04/22 was 713.0  2: HTN with CKD- - BP 157/98 - continue amlodipine 10mg  daily - consider renal ultrasound but he currently has no insurance coverage - saw PCP @ [facility] 10/24 - BMP 06/10/23 reviewed and showed sodium 142, potassium 5.1, creatinine 2.61 and GFR 30 - will need to recheck this next week @ PCP appt  3: Tobacco use- - smoking 2 cigarettes daily & says that he's trying to quit - complete cessation discussed for 3 minutes with him  4: Cocaine abuse- - says that he occasionally uses cocaine when his back pain is so severe that he can't stand  it - last cocaine use was ~ a few days ago  5: Atrial fibrillation- - saw cardiology ([facility]) 01/24 - continue xarelto 15mg  once daily - continue ASA 81mg  daily  Return in 1 month, sooner if needed.

## 2023-06-12 ENCOUNTER — Ambulatory Visit: Payer: Medicaid Other | Attending: Family | Admitting: Family

## 2023-06-12 ENCOUNTER — Encounter: Payer: Self-pay | Admitting: Family

## 2023-06-12 ENCOUNTER — Other Ambulatory Visit: Payer: Self-pay

## 2023-06-12 VITALS — BP 157/98 | HR 65 | Wt 281.0 lb

## 2023-06-12 DIAGNOSIS — I4891 Unspecified atrial fibrillation: Secondary | ICD-10-CM | POA: Diagnosis not present

## 2023-06-12 DIAGNOSIS — Z7982 Long term (current) use of aspirin: Secondary | ICD-10-CM | POA: Insufficient documentation

## 2023-06-12 DIAGNOSIS — F1410 Cocaine abuse, uncomplicated: Secondary | ICD-10-CM | POA: Diagnosis not present

## 2023-06-12 DIAGNOSIS — Z79899 Other long term (current) drug therapy: Secondary | ICD-10-CM | POA: Insufficient documentation

## 2023-06-12 DIAGNOSIS — Z7901 Long term (current) use of anticoagulants: Secondary | ICD-10-CM | POA: Insufficient documentation

## 2023-06-12 DIAGNOSIS — I428 Other cardiomyopathies: Secondary | ICD-10-CM | POA: Diagnosis not present

## 2023-06-12 DIAGNOSIS — I480 Paroxysmal atrial fibrillation: Secondary | ICD-10-CM

## 2023-06-12 DIAGNOSIS — Z5982 Transportation insecurity: Secondary | ICD-10-CM | POA: Insufficient documentation

## 2023-06-12 DIAGNOSIS — I5032 Chronic diastolic (congestive) heart failure: Secondary | ICD-10-CM

## 2023-06-12 DIAGNOSIS — I130 Hypertensive heart and chronic kidney disease with heart failure and stage 1 through stage 4 chronic kidney disease, or unspecified chronic kidney disease: Secondary | ICD-10-CM | POA: Insufficient documentation

## 2023-06-12 DIAGNOSIS — I10 Essential (primary) hypertension: Secondary | ICD-10-CM

## 2023-06-12 DIAGNOSIS — N183 Chronic kidney disease, stage 3 unspecified: Secondary | ICD-10-CM | POA: Diagnosis not present

## 2023-06-12 DIAGNOSIS — F17210 Nicotine dependence, cigarettes, uncomplicated: Secondary | ICD-10-CM | POA: Insufficient documentation

## 2023-06-12 DIAGNOSIS — Z5986 Financial insecurity: Secondary | ICD-10-CM | POA: Insufficient documentation

## 2023-06-12 DIAGNOSIS — Z720 Tobacco use: Secondary | ICD-10-CM

## 2023-06-12 MED ORDER — SPIRONOLACTONE 25 MG PO TABS
12.5000 mg | ORAL_TABLET | Freq: Every day | ORAL | 1 refills | Status: DC
  Filled 2023-06-12 – 2023-07-04 (×2): qty 45, 90d supply, fill #0

## 2023-06-12 NOTE — Patient Instructions (Addendum)
PLEASE BE SURE YOU ARE TAKING 1/2 A PILL FOR SPIRONOLACTONE 25 MG (MAKING IT 12.5 MG DAILY.)   DO NOT TAKE ANY ENTRESTO. THIS MEDICATION HAS BEEN DISCONTINUED.

## 2023-06-16 ENCOUNTER — Other Ambulatory Visit: Payer: Self-pay

## 2023-06-17 ENCOUNTER — Ambulatory Visit: Payer: Self-pay | Admitting: Gerontology

## 2023-07-03 ENCOUNTER — Other Ambulatory Visit: Payer: Self-pay

## 2023-07-05 ENCOUNTER — Other Ambulatory Visit: Payer: Self-pay

## 2023-07-06 ENCOUNTER — Other Ambulatory Visit: Payer: Self-pay

## 2023-07-08 ENCOUNTER — Other Ambulatory Visit: Payer: Self-pay

## 2023-07-09 ENCOUNTER — Ambulatory Visit: Payer: Self-pay

## 2023-07-16 ENCOUNTER — Ambulatory Visit: Payer: Self-pay | Admitting: General Practice

## 2023-07-16 VITALS — BP 210/140 | HR 73 | Temp 98.2°F | Wt 280.2 lb

## 2023-07-16 DIAGNOSIS — I5032 Chronic diastolic (congestive) heart failure: Secondary | ICD-10-CM

## 2023-07-16 DIAGNOSIS — F1410 Cocaine abuse, uncomplicated: Secondary | ICD-10-CM

## 2023-07-16 DIAGNOSIS — I129 Hypertensive chronic kidney disease with stage 1 through stage 4 chronic kidney disease, or unspecified chronic kidney disease: Secondary | ICD-10-CM

## 2023-07-16 DIAGNOSIS — Z9189 Other specified personal risk factors, not elsewhere classified: Secondary | ICD-10-CM

## 2023-07-16 DIAGNOSIS — I10 Essential (primary) hypertension: Secondary | ICD-10-CM

## 2023-07-16 IMAGING — CR DG LUMBAR SPINE COMPLETE 4+V
1 series · 5 of 5 positions shown · non-contrast
Comparison: October 21, 2017.

CLINICAL DATA: Lower back and left lower extremity pain after
injury 2 days ago.

EXAM:
LUMBAR SPINE - COMPLETE 4+ VIEW

[Series 1: dg lumbar spine complete 4 +v · 0.14mm/px · 5 of 5 slices shown]
[im 1/5]
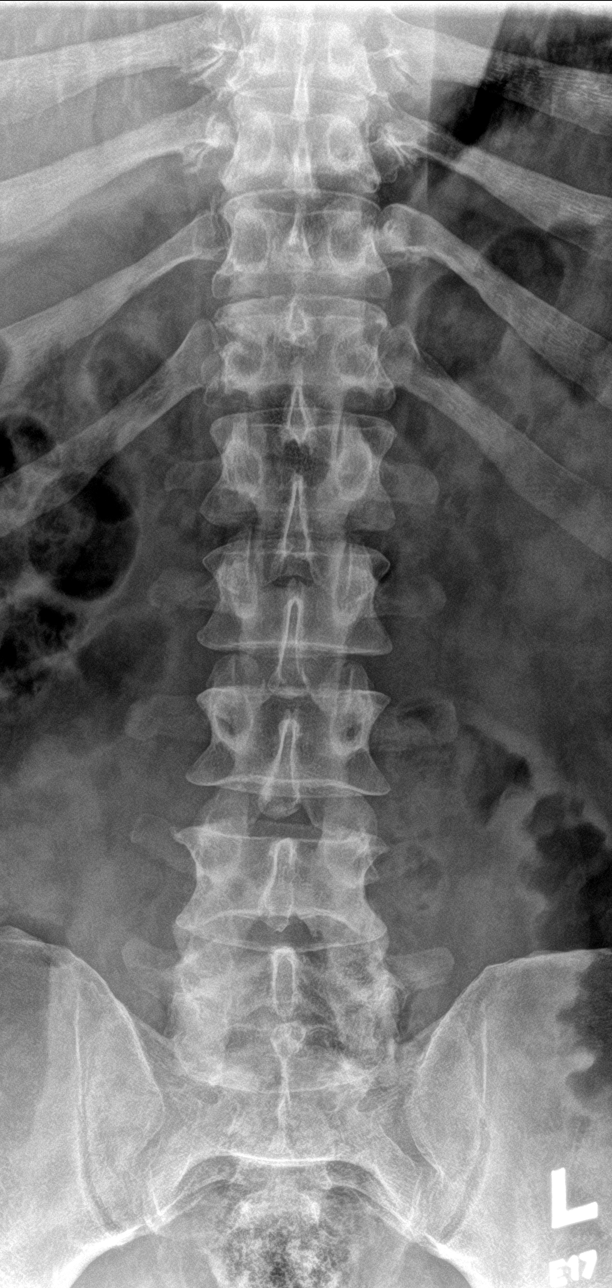
[im 2/5]
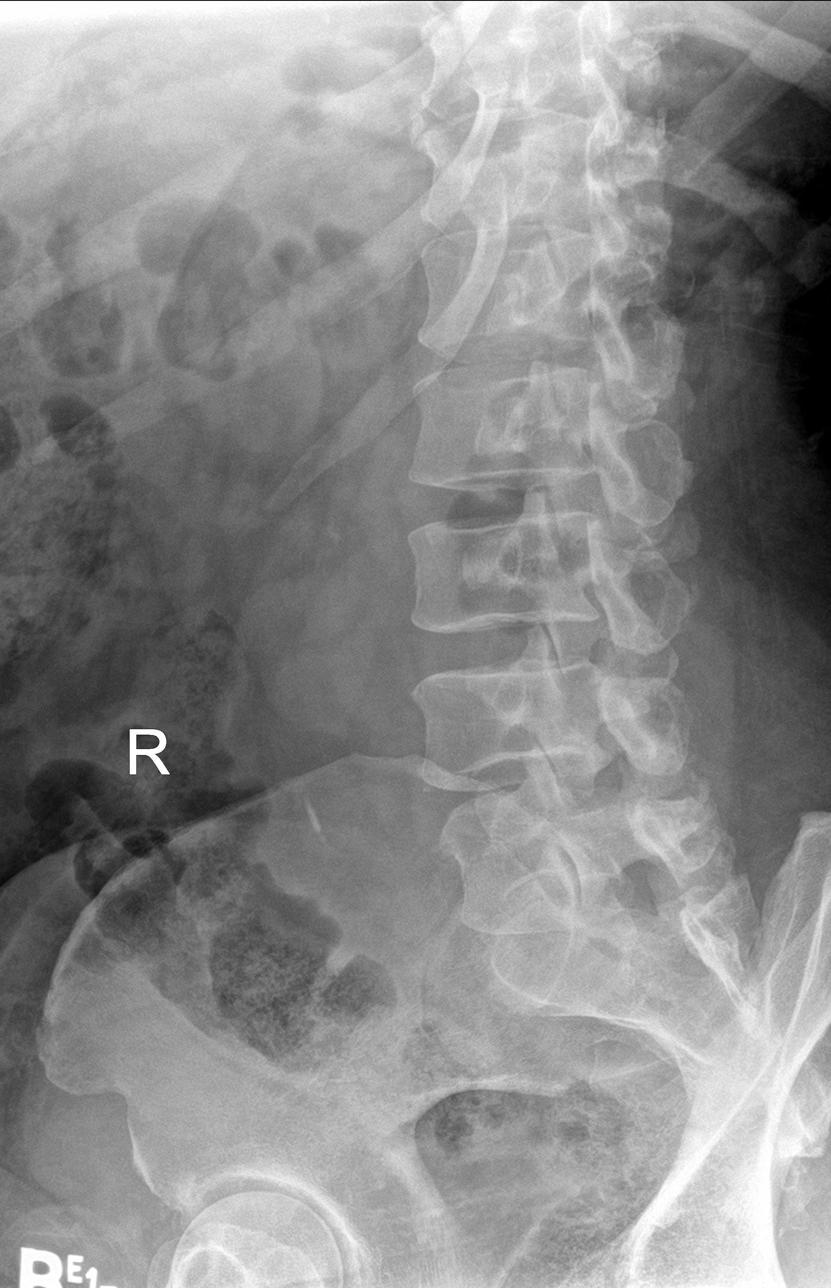
[im 3/5]
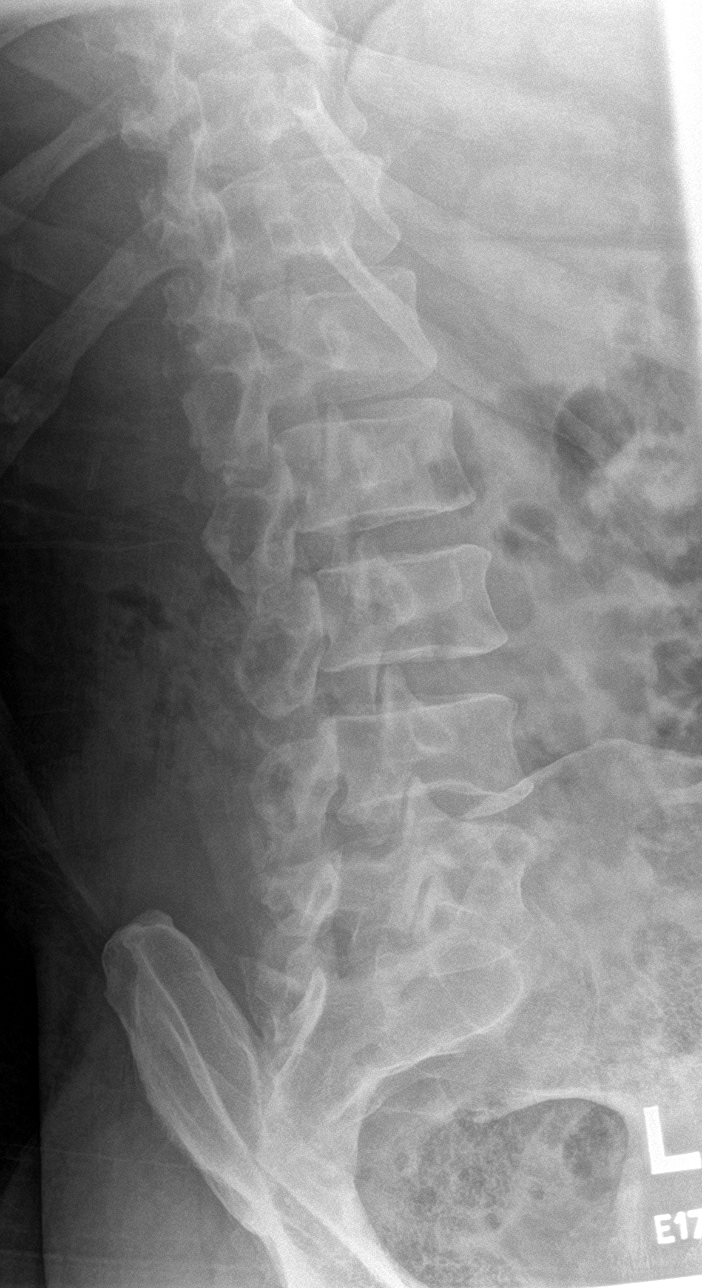
[im 4/5]
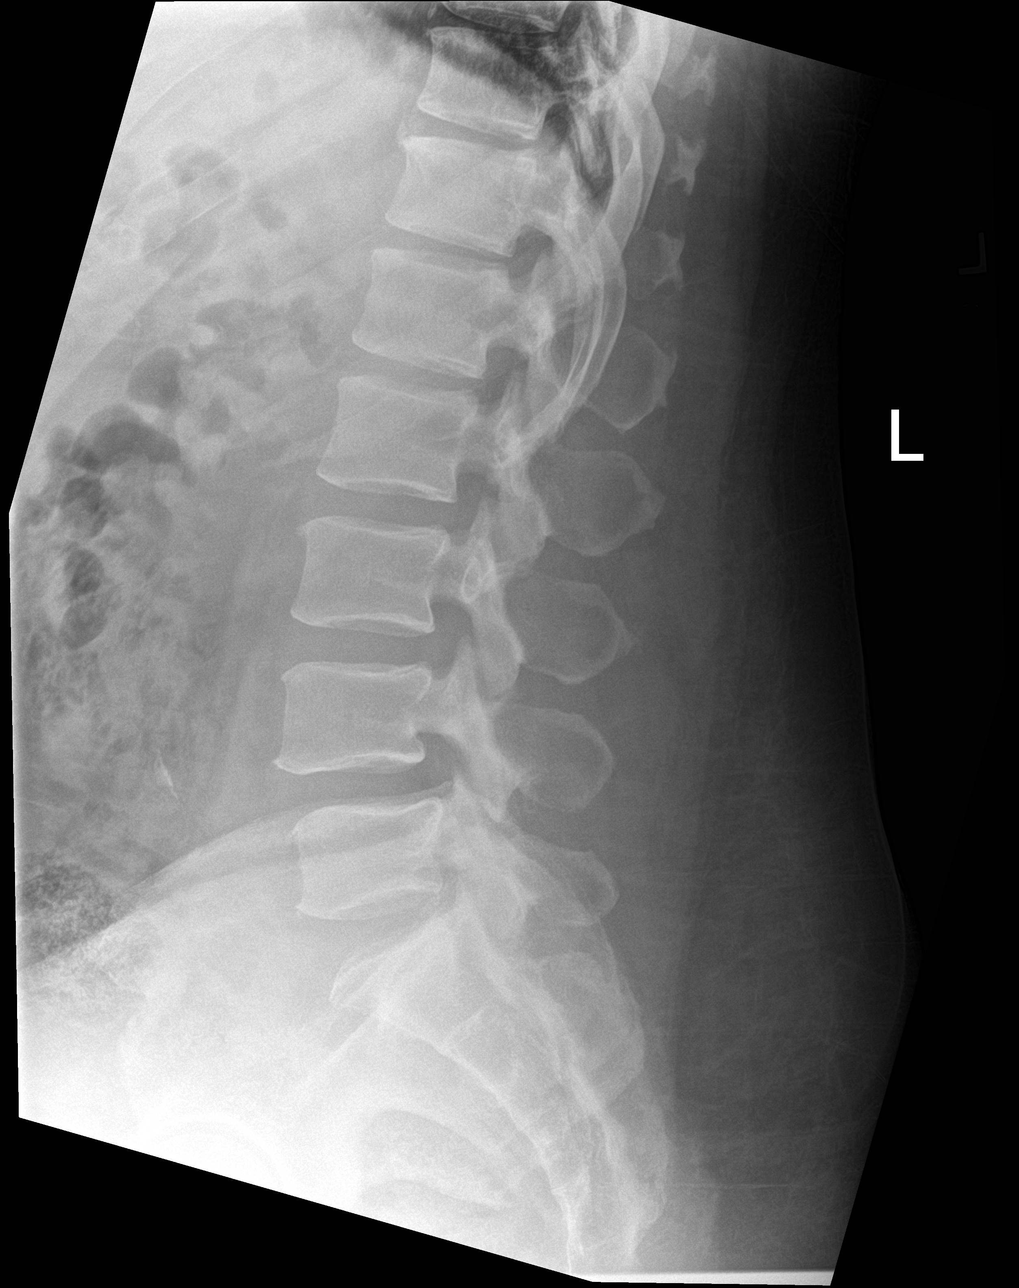
[im 5/5]
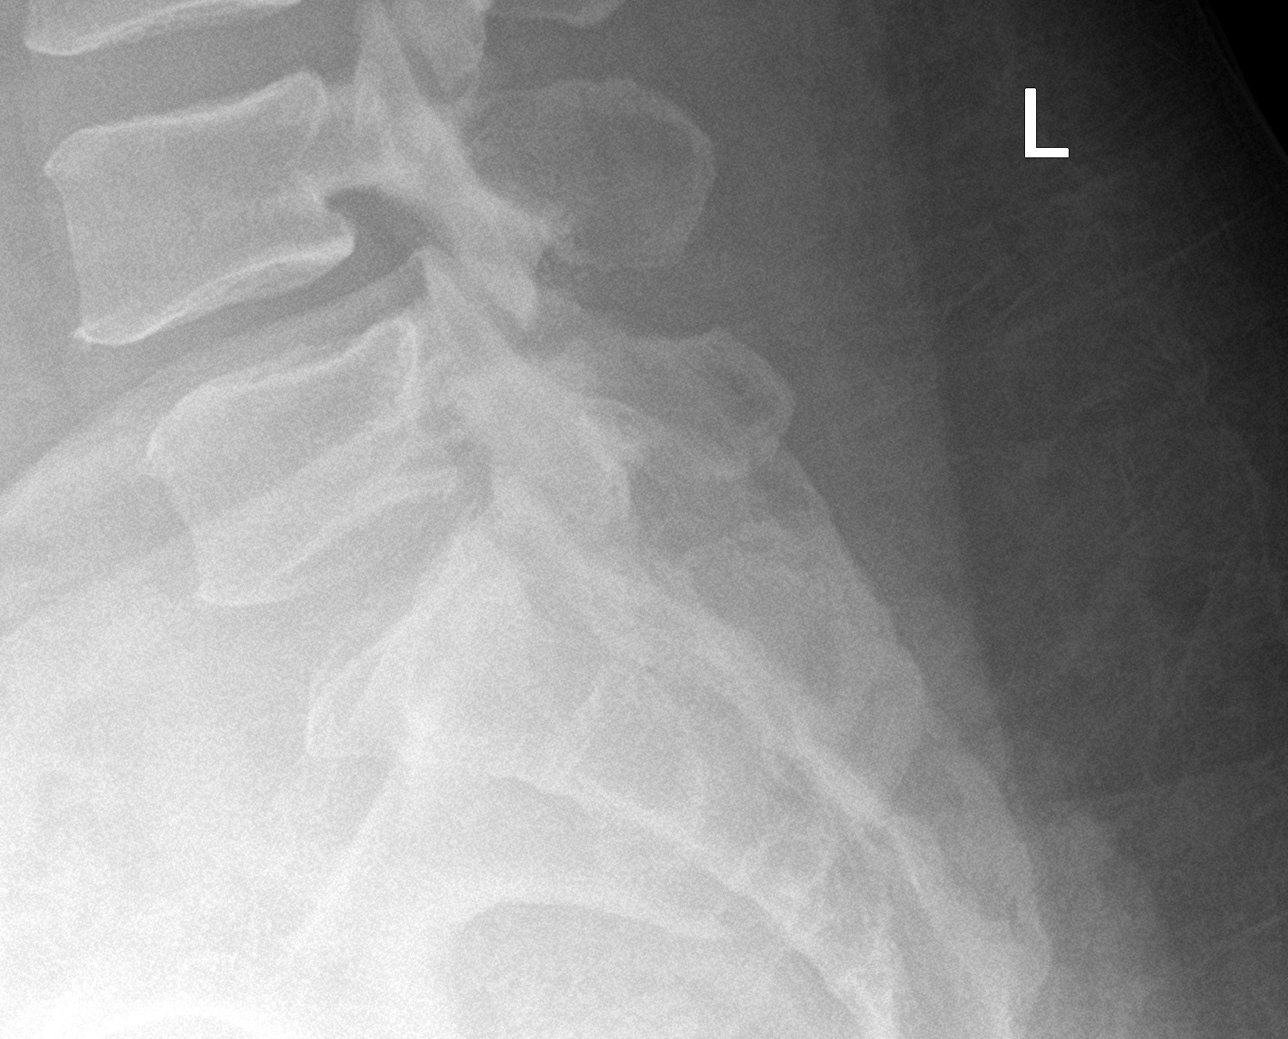

[5 of 5 positions shown; findings below may reference images not displayed]

FINDINGS: Mild grade 1 anterolisthesis of L4-5 is noted secondary to posterior
facet joint hypertrophy. No fracture is noted. Disc spaces are
well-maintained. Minimal anterior osteophyte formation is noted
anteriorly at L4-5.
IMPRESSION: Minimal to mild degenerative changes as described above. No acute
abnormality is noted.

## 2023-07-16 IMAGING — CR DG ANKLE COMPLETE 3+V*L*
1 series · 3 of 3 positions shown · non-contrast
Comparison: None.

CLINICAL DATA: Left ankle pain after injury 2 days ago.

EXAM:
LEFT ANKLE COMPLETE - 3+ VIEW

[Series 1: dg ankle complete left · 0.14mm/px · 3 of 3 slices shown]
[im 1/3]
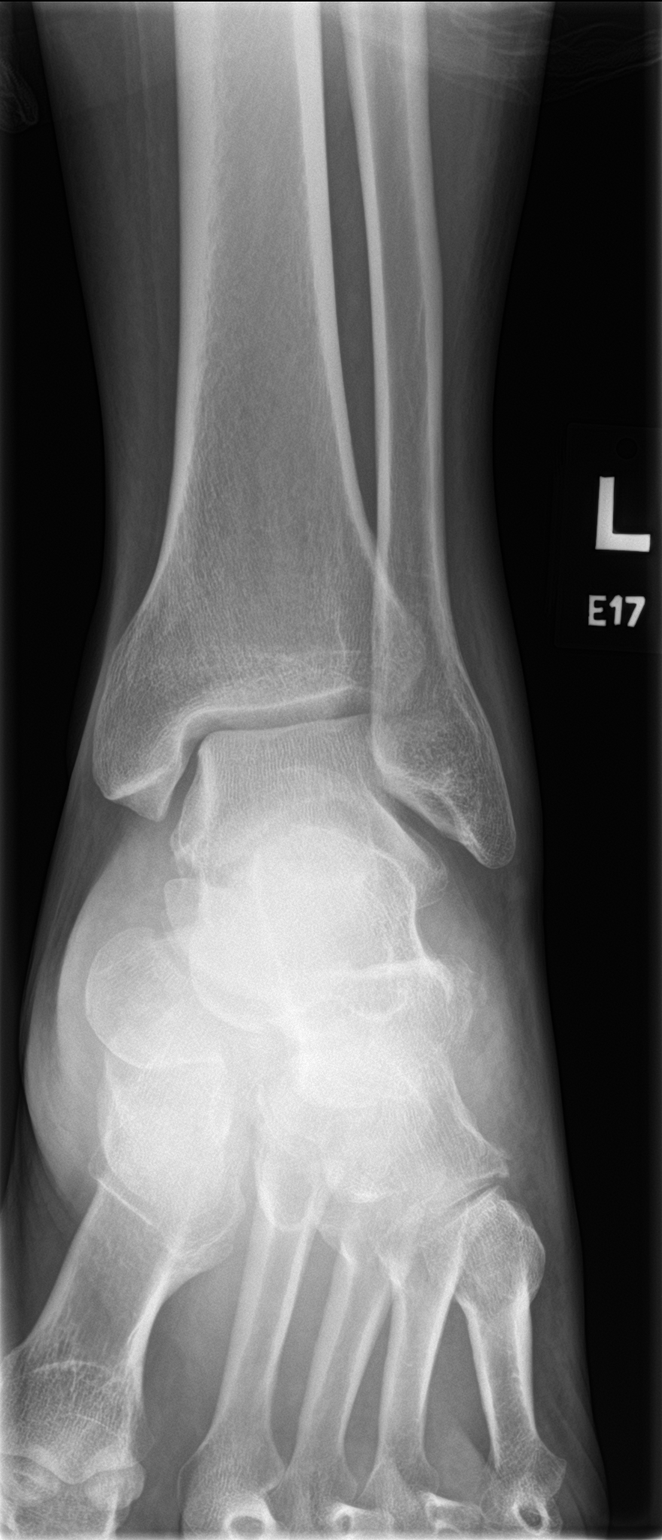
[im 2/3]
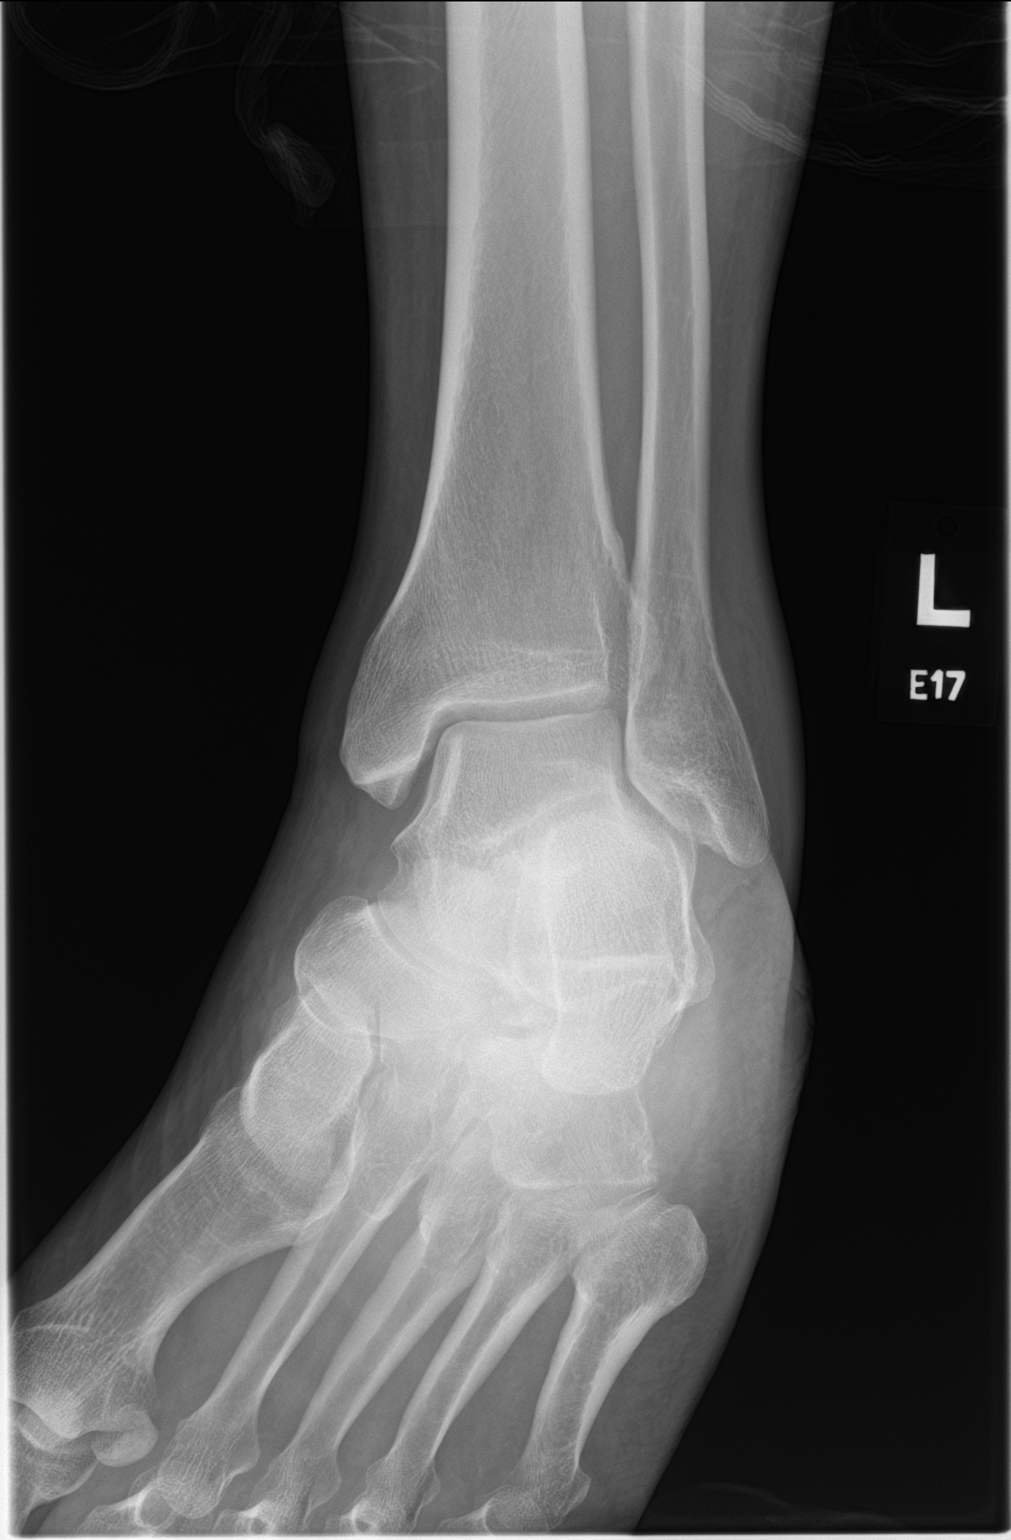
[im 3/3]
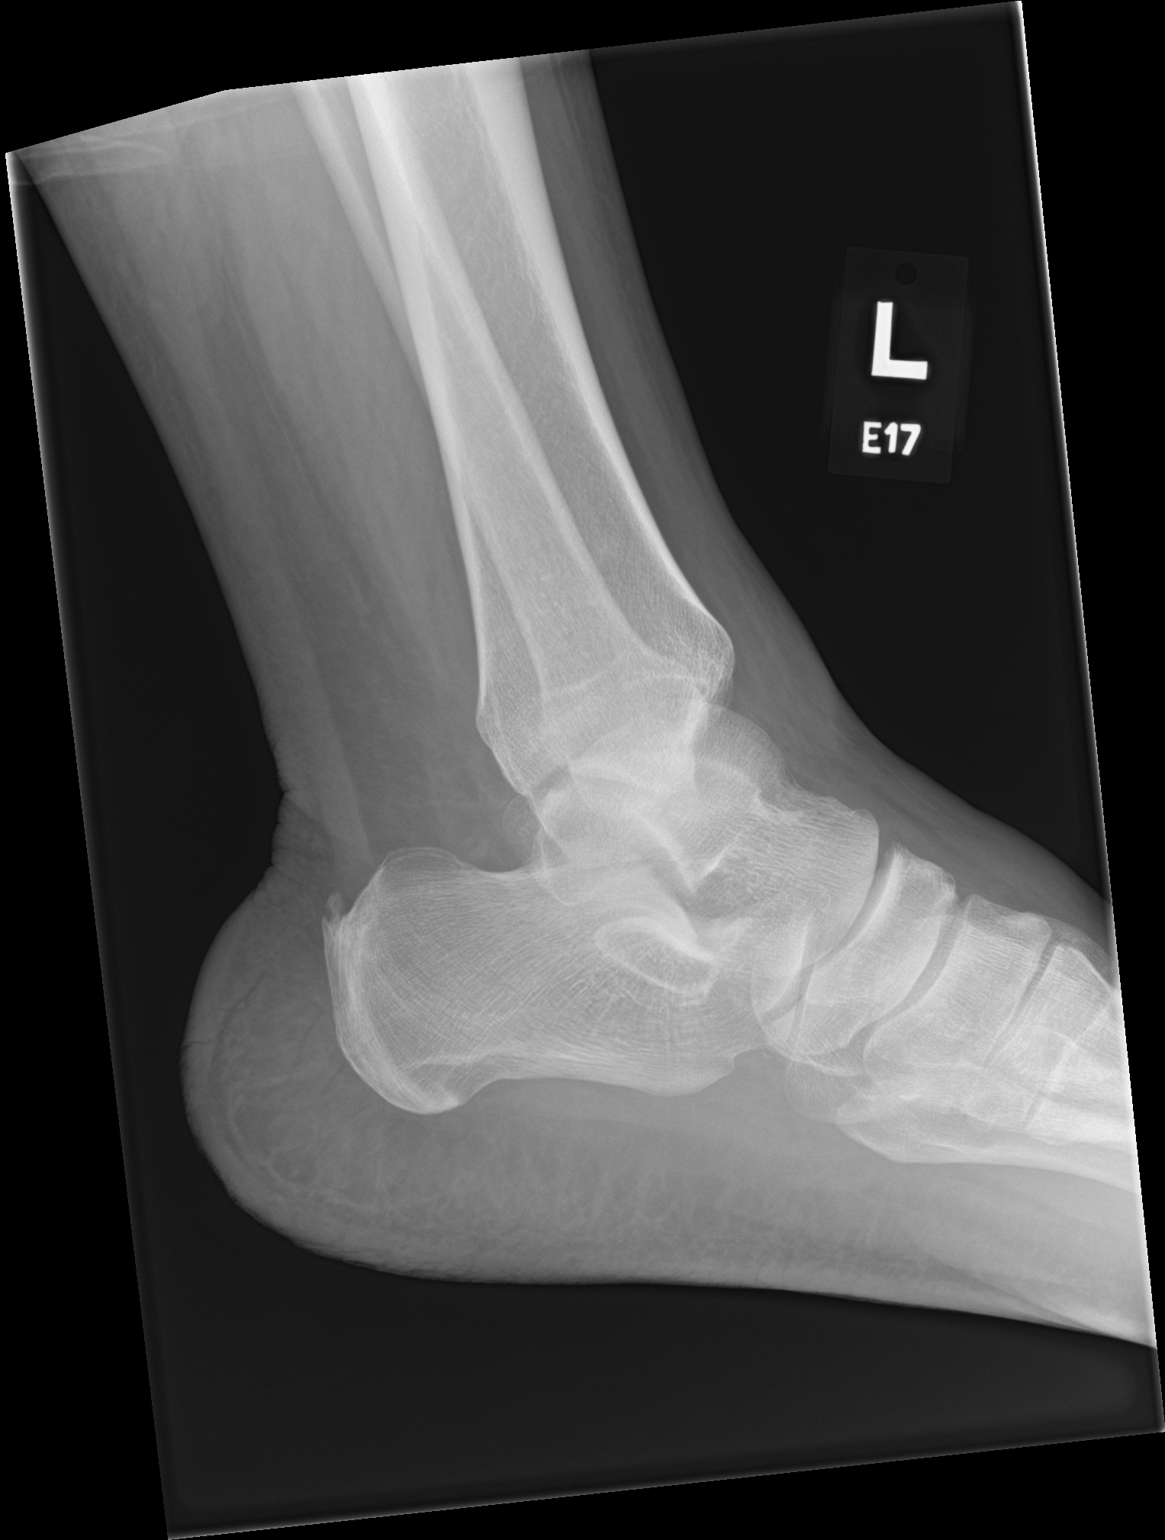

[3 of 3 positions shown; findings below may reference images not displayed]

FINDINGS: There is no evidence of fracture, dislocation, or joint effusion.
There is no evidence of arthropathy or other focal bone abnormality.
Soft tissues are unremarkable.
IMPRESSION: Negative.

## 2023-07-16 NOTE — Progress Notes (Signed)
Established Patient Office Visit  Subjective   Patient ID: Brad [name]., male    DOB: [birth year]  Age: 47 y.o. MRN: [id number]  Chief Complaint  Patient presents with   Hypertension   Congestive Heart Failure   Headache   hypertensive kidney disease     Brad [name].  Is 47 y/o male  who has history of HTN, tobacco use, substance abuse, rapid atrial fibrillation and chronic heart failure, presents to the clinic for follow up.  He states that he is compliant with his medications, denies side effects and continues to make healthy lifestyle changes. His blood pressure was elevated during visit at 230/149, 198/130 and manually 210/140 at beginning of visit. At end of visit B/P was 199/110 manually.  Pt denies checking his blood pressure at home.  Pt reports last taking medication for B/P 1 day ago and denies need for refills.  Pt endorses no added salt diet.  He denies chest pain, palpitation, shortness of breath, peripheral edema and vision changes. Pt does report trouble sleeping at night and during the day short naps.  Pt reports that his mother died 4 days ago.  Overall, he states that he's doing well and offers no further complaint.       Patient Active Problem List   Diagnosis Date Noted   Encounter to establish care 05/28/2023   Demand ischemia (HCC) 03/21/2022   Chronic heart failure with preserved ejection fraction (HFpEF) (HCC) 03/21/2022   Polysubstance abuse (HCC) 03/21/2022   NSTEMI (non-ST elevated myocardial infarction) [facility])    Cardiogenic shock (HCC) 03/04/2022   New onset atrial fibrillation (HCC)    Chronic pain of right ankle 05/10/2021   Morbid obesity (HCC) 04/10/2021   Hypertensive emergency 04/10/2021   Acute on chronic diastolic CHF (congestive heart failure) (HCC) 02/15/2021   Hypertensive kidney disease with stage 3a chronic kidney disease (HCC) 02/15/2021   Type 2 diabetes mellitus with morbid obesity (HCC) 02/15/2021   Tobacco dependence  02/15/2021   Cocaine abuse in remission (HCC) 02/15/2021   Marijuana user 02/15/2021   Acute pulmonary edema (HCC)    Acute heart failure (HCC) 01/12/2021   Asthma    Essential hypertension    Cocaine abuse (HCC) 04/17/2020   Pneumonia due to COVID-19 virus 04/16/2020   Acute hypoxemic respiratory failure due to COVID-19 (HCC) 04/16/2020   Benign essential HTN 04/16/2020   CKD (chronic kidney disease), stage IIIa 04/16/2020   Elevated troponin 04/16/2020   Acute renal failure superimposed on stage 3a chronic kidney disease (HCC)    Diverticulitis 10/06/2018   Hypertensive urgency 04/18/2016   Intractable nausea and vomiting 05/31/2015      Review of Systems  Constitutional:  Negative for diaphoresis and malaise/fatigue.  Eyes:  Negative for blurred vision and double vision.  Cardiovascular:  Negative for chest pain and palpitations.  Gastrointestinal:  Negative for abdominal pain, constipation, diarrhea, heartburn, nausea and vomiting.  Genitourinary:  Negative for dysuria, frequency and urgency.  Musculoskeletal:  Negative for back pain, falls and joint pain.  Neurological:  Negative for dizziness, weakness and headaches.  Psychiatric/Behavioral:  Negative for depression. The patient has insomnia. The patient is not nervous/anxious.       Objective:     BP (!) 210/140 (BP Location: Left Arm, Patient Position: Sitting, Cuff Size: Large)   Pulse 73   Temp 98.2 F (36.8 C)   Wt 280 lb 3.2 oz (127.1 kg)   SpO2 96%   BMI 42.60 kg/m  BP Readings from Last 3 Encounters:  07/16/23 (!) 210/140  06/12/23 (!) 157/98  06/10/23 (!) 151/94   Wt Readings from Last 3 Encounters:  07/16/23 280 lb 3.2 oz (127.1 kg)  06/12/23 281 lb (127.5 kg)  06/10/23 282 lb (127.9 kg)      Physical Exam Constitutional:      Appearance: Normal appearance.  HENT:     Head: Normocephalic.  Cardiovascular:     Rate and Rhythm: Normal rate and regular rhythm.     Heart sounds: Normal heart  sounds.  Pulmonary:     Effort: Pulmonary effort is normal.     Breath sounds: Normal breath sounds.  Abdominal:     Palpations: Abdomen is soft. There is no mass.  Skin:    General: Skin is warm and dry.  Neurological:     Mental Status: He is alert and oriented to person, place, and time.  Psychiatric:        Behavior: Behavior normal.      No results found for any visits on 07/16/23.  Last CBC Lab Results  Component Value Date   WBC 7.6 06/10/2023   HGB 16.5 06/10/2023   HCT 50.2 06/10/2023   MCV 81 06/10/2023   MCH 26.7 06/10/2023   RDW 15.7 (H) 06/10/2023   PLT 246 06/10/2023   Last metabolic panel Lab Results  Component Value Date   GLUCOSE 145 (H) 06/10/2023   NA 142 06/10/2023   K 5.1 06/10/2023   CL 103 06/10/2023   CO2 21 06/10/2023   BUN 50 (H) 06/10/2023   CREATININE 2.61 (H) 06/10/2023   EGFR 30 (L) 06/10/2023   CALCIUM 9.4 06/10/2023   PHOS 3.6 03/04/2022   PROT 7.0 06/10/2023   ALBUMIN 4.2 06/10/2023   LABGLOB 2.8 06/10/2023   BILITOT 0.2 06/10/2023   ALKPHOS 85 06/10/2023   AST 20 06/10/2023   ALT 18 06/10/2023   ANIONGAP 10 03/05/2023   Last hemoglobin A1c Lab Results  Component Value Date   HGBA1C 6.4 (H) 06/10/2023      The ASCVD Risk score (Arnett DK, et al., 2019) failed to calculate for the following reasons:   The patient has a prior MI or stroke diagnosis    Assessment & Plan:   1. Chronic diastolic congestive heart failure (HCC)  - Comp Met (CMET); Future - Comp Met (CMET)  2. Hypertensive kidney disease with stage 3a chronic kidney disease (HCC) Discussed need for nephrology  - Ambulatory referral to Nephrology  3. Essential hypertension 230/149, 198/130, 210/140  manual and 199/110 at end of exam.  Recommended pt to go to ED.  Pt declined.  Pt given warning signs of heart attack and stroke.   Pt has cardiology appointment on 07/17/23 at 2 pm.  Pt encourage to keep appointment.    4. Morbid obesity (HCC) Pt  walking 3-4 time per week "up the street back" , does not know time frame"  encourage to continue to walk for 20- 30 min daily at tolerable pace.   5. Cocaine abuse (HCC) Pt reports last cocaine use was 1 week ago   6. At risk for sleep apnea Pt reports not sleeping well, interrupted sleep frequently.  Pt reports was supposed to have sleep study but [place] too far to travel. Will refer for sleep study.  Discussed may need to travel to have study.       Return in about 2 months (around 09/15/2023).    Wendi Snipes, FNP

## 2023-07-17 ENCOUNTER — Encounter: Payer: Self-pay | Admitting: Family

## 2023-07-17 LAB — COMPREHENSIVE METABOLIC PANEL
ALT: 15 [IU]/L (ref 0–44)
AST: 22 [IU]/L (ref 0–40)
Albumin: 4.4 g/dL (ref 4.1–5.1)
Alkaline Phosphatase: 81 [IU]/L (ref 44–121)
BUN/Creatinine Ratio: 15 (ref 9–20)
BUN: 43 mg/dL — ABNORMAL HIGH (ref 6–24)
Bilirubin Total: 0.3 mg/dL (ref 0.0–1.2)
CO2: 19 mmol/L — ABNORMAL LOW (ref 20–29)
Calcium: 9.8 mg/dL (ref 8.7–10.2)
Chloride: 104 mmol/L (ref 96–106)
Creatinine, Ser: 2.9 mg/dL — ABNORMAL HIGH (ref 0.76–1.27)
Globulin, Total: 3.2 g/dL (ref 1.5–4.5)
Glucose: 112 mg/dL — ABNORMAL HIGH (ref 70–99)
Potassium: 4.2 mmol/L (ref 3.5–5.2)
Sodium: 143 mmol/L (ref 134–144)
Total Protein: 7.6 g/dL (ref 6.0–8.5)
eGFR: 26 mL/min/{1.73_m2} — ABNORMAL LOW (ref >59)

## 2023-07-28 ENCOUNTER — Other Ambulatory Visit: Payer: Self-pay

## 2023-07-29 ENCOUNTER — Telehealth: Payer: Self-pay | Admitting: Family

## 2023-07-29 NOTE — Telephone Encounter (Signed)
Pt confirmed appt for 07/30/23

## 2023-07-30 ENCOUNTER — Encounter: Payer: Self-pay | Admitting: Family

## 2023-07-30 ENCOUNTER — Other Ambulatory Visit: Payer: Self-pay | Admitting: Gerontology

## 2023-07-30 ENCOUNTER — Ambulatory Visit: Payer: Self-pay | Attending: Family | Admitting: Family

## 2023-07-30 VITALS — BP 152/88 | HR 61 | Wt 284.0 lb

## 2023-07-30 DIAGNOSIS — Z79899 Other long term (current) drug therapy: Secondary | ICD-10-CM | POA: Insufficient documentation

## 2023-07-30 DIAGNOSIS — I10 Essential (primary) hypertension: Secondary | ICD-10-CM

## 2023-07-30 DIAGNOSIS — Z720 Tobacco use: Secondary | ICD-10-CM

## 2023-07-30 DIAGNOSIS — I5032 Chronic diastolic (congestive) heart failure: Secondary | ICD-10-CM | POA: Insufficient documentation

## 2023-07-30 DIAGNOSIS — N183 Chronic kidney disease, stage 3 unspecified: Secondary | ICD-10-CM | POA: Insufficient documentation

## 2023-07-30 DIAGNOSIS — Z7901 Long term (current) use of anticoagulants: Secondary | ICD-10-CM | POA: Insufficient documentation

## 2023-07-30 DIAGNOSIS — Z7982 Long term (current) use of aspirin: Secondary | ICD-10-CM | POA: Insufficient documentation

## 2023-07-30 DIAGNOSIS — N1831 Hypertensive chronic kidney disease with stage 1 through stage 4 chronic kidney disease, or unspecified chronic kidney disease: Secondary | ICD-10-CM

## 2023-07-30 DIAGNOSIS — F17210 Nicotine dependence, cigarettes, uncomplicated: Secondary | ICD-10-CM | POA: Insufficient documentation

## 2023-07-30 DIAGNOSIS — I130 Hypertensive heart and chronic kidney disease with heart failure and stage 1 through stage 4 chronic kidney disease, or unspecified chronic kidney disease: Secondary | ICD-10-CM | POA: Insufficient documentation

## 2023-07-30 DIAGNOSIS — I4891 Unspecified atrial fibrillation: Secondary | ICD-10-CM | POA: Insufficient documentation

## 2023-07-30 DIAGNOSIS — I428 Other cardiomyopathies: Secondary | ICD-10-CM | POA: Insufficient documentation

## 2023-07-30 DIAGNOSIS — I480 Paroxysmal atrial fibrillation: Secondary | ICD-10-CM

## 2023-07-30 DIAGNOSIS — F1410 Cocaine abuse, uncomplicated: Secondary | ICD-10-CM | POA: Insufficient documentation

## 2023-07-30 NOTE — Patient Instructions (Signed)
It was good to see you today, taka care of yourself!

## 2023-07-30 NOTE — Progress Notes (Signed)
PCP: Eulogio Bear, NP @ [facility] (last seen 11/24) Primary Cardiologist: End, Cristal Deer, MD (last seen 01/24)  HPI:  Brad [name] is a 47 y/o male with a history of HTN, tobacco use, substance abuse, rapid atrial fibrillation and chronic heart failure.    Has not been admitted or been in the ED in the last 6 months.   Echo 04/19/16: EF 55-65% with mild LVH Echo 01/13/21: EF 60-65% along with severe LVH.  Echo 03/04/22: EF 60-65% along with severe LVH.  He presents today with a chief complaint of minimal shortness of breath with moderate exertion. Has no other symptoms and specifically denies fatigue, chest pain, cough, palpitations, abdominal distention, pedal edema, dizziness or difficulty sleeping. Does report being under a lot of stress as his mom died two weeks ago. He did use cocaine 2 days ago to deal with the stress. His mom lived with him and he's somewhat estranged from his brother.   At last visit, spironolactone was decreased to 12.5mg  daily. At recent PCP appointment, sleep study and nephrology referrals were placed.   ROS: All systems negative except as listed in HPI, PMH and Problem List.  SH:  Social History   Socioeconomic History   Marital status: Single    Spouse name: Not on file   Number of children: Not on file   Years of education: Not on file   Highest education level: Not on file  Occupational History   Not on file  Tobacco Use   Smoking status: Some Days    Current packs/day: 1.00    Types: Cigarettes   Smokeless tobacco: Never   Tobacco comments:    2-3 cigarettes   Vaping Use   Vaping status: Never Used  Substance and Sexual Activity   Alcohol use: Not Currently    Comment: occas   Drug use: Yes    Types: Marijuana, Cocaine    Comment: marijuana daily, cocaine only on really painful days   Sexual activity: Yes  Other Topics Concern   Not on file  Social History Narrative   Not on file   Social Determinants of Health    Financial Resource Strain: Medium Risk (06/10/2023)   Overall Financial Resource Strain (CARDIA)    Difficulty of Paying Living Expenses: Somewhat hard  Food Insecurity: Food Insecurity Present (07/16/2023)   Hunger Vital Sign    Worried About Running Out of Food in the Last Year: Sometimes true    Ran Out of Food in the Last Year: Sometimes true  Transportation Needs: Unmet Transportation Needs (06/10/2023)   PRAPARE - Administrator, Civil Service (Medical): Yes    Lack of Transportation (Non-Medical): Yes  Physical Activity: Insufficiently Active (06/10/2023)   Exercise Vital Sign    Days of Exercise per Week: 3 days    Minutes of Exercise per Session: 30 min  Stress: Stress Concern Present (06/10/2023)   [employer] of Occupational Health - Occupational Stress Questionnaire    Feeling of Stress : To some extent  Social Connections: Socially Isolated (06/10/2023)   Social Connection and Isolation Panel [NHANES]    Frequency of Communication with Friends and Family: More than three times a week    Frequency of Social Gatherings with Friends and Family: Never    Attends Religious Services: Never    Database administrator or Organizations: No    Attends Banker Meetings: Never    Marital Status: Never married  Catering manager Violence: Not At Risk (  06/10/2023)   Humiliation, Afraid, Rape, and Kick questionnaire    Fear of Current or Ex-Partner: No    Emotionally Abused: No    Physically Abused: No    Sexually Abused: No    FH:  Family History  Problem Relation Age of Onset   Hypertension Mother     Past Medical History:  Diagnosis Date   Arrhythmia    atrial fibrillation   Asthma    CHF (congestive heart failure) (HCC)    CKD (chronic kidney disease), stage III (HCC)    Cocaine abuse (HCC)    Diverticulitis    Hypertension    Tobacco use disorder     Current Outpatient Medications  Medication Sig Dispense Refill   amLODipine  (NORVASC) 10 MG tablet Take 1 tablet (10 mg total) by mouth daily. 90 tablet 3   aspirin EC 81 MG EC tablet Take 1 tablet (81 mg total) by mouth daily. Swallow whole. 30 tablet 11   atorvastatin (LIPITOR) 80 MG tablet Take 1 tablet (80 mg total) by mouth daily. 90 tablet 3   dapagliflozin propanediol (FARXIGA) 10 MG TABS tablet Take 1 tablet (10 mg total) by mouth daily before breakfast. 30 tablet 5   furosemide (LASIX) 40 MG tablet Take 1 tablet (40 mg total) by mouth once daily. 30 tablet 5   isosorbide mononitrate (IMDUR) 30 MG 24 hr tablet Take 1 tablet (30 mg total) by mouth once daily. 90 tablet 3   metoprolol (TOPROL XL) 200 MG 24 hr tablet Take 1 tablet (200 mg total) by mouth daily. 90 tablet 3   Rivaroxaban (XARELTO) 15 MG TABS tablet Take 1 tablet (15 mg total) by mouth daily. 90 tablet 3   spironolactone (ALDACTONE) 25 MG tablet Take 0.5 tablets (12.5 mg total) by mouth daily. 45 tablet 1   valsartan (DIOVAN) 160 MG tablet Take 1 tablet (160 mg total) by mouth daily. 90 tablet 3   No current facility-administered medications for this visit.   Vitals:   07/30/23 1046  BP: (!) 152/88  Pulse: 61  SpO2: 97%  Weight: 284 lb (128.8 kg)   Wt Readings from Last 3 Encounters:  07/30/23 284 lb (128.8 kg)  07/16/23 280 lb 3.2 oz (127.1 kg)  06/12/23 281 lb (127.5 kg)   Lab Results  Component Value Date   CREATININE 2.90 (H) 07/16/2023   CREATININE 2.61 (H) 06/10/2023   CREATININE 2.25 (H) 03/05/2023    PHYSICAL EXAM:  General:  Well appearing. No resp difficulty HEENT: normal Neck: supple. JVP flat. No lymphadenopathy or thryomegaly appreciated. Cor: PMI normal. Regular rate & rhythm. No rubs, gallops or murmurs. Lungs: clear Abdomen: soft, nontender, nondistended. No hepatosplenomegaly. No bruits or masses.  Extremities: no cyanosis, clubbing, rash, edema Neuro: alert & oriented x3, cranial nerves grossly intact. Moves all 4 extremities w/o difficulty. Affect  pleasant.   ECG: not done   ASSESSMENT & PLAN:  1: NICM with preserved ejection fraction- - likely HTN/ AF/ cocaine use - NYHA class II - euvolemic today - weighing daily; reminded to call for an overnight weight gain of > 2 pounds or a weekly weight gain of > 5 pounds - weight up 3 pounds from last visit here 6 weeks ago - Echo 04/19/16: EF 55-65% with mild LVH - Echo 01/13/21: EF of 60-65% along with severe LVH.  - echo 03/04/22: EF of 60-65% along with severe LVH. - would like to get updated echo if he's able to get insurance - does  not add salt to his food; reviewed the importance of reading food labels for sodium content - continue farxiga 10mg  daily - continue furosemide 40mg  daily - continue isosorbide MN 30mg  daily - continue metoprolol 200mg  daily - continue spironolactone 12.5mg  daily; may need to stop this if renal function continues to worsen - continue valsartan 160mg  daily - BNP 03/04/22 was 713.0  2: HTN with CKD- - BP 152/88 - continue amlodipine 10mg  daily - continue metoprolol, spironolactone, isosorbide and valsartan - would like to add hydralazine for his BP but he says that taking meds >once a day doesn't work for him - consider renal ultrasound but he currently has no insurance coverage - saw PCP @ [facility] 11/24 - BMP 07/16/23 reviewed and showed sodium 143, potassium 4.2, creatinine 2.9 and GFR 26 - PCP recently placed nephrology referral  3: Tobacco use- - smoking marijuana - complete cessation discussed for 3 minutes with him  4: Cocaine abuse- - says that he occasionally uses cocaine when his back pain is so severe that he can't stand it - last cocaine use was a couple of days ago to deal with the death of his mom  5: Atrial fibrillation- - saw cardiology ([facility]) 01/24 - continue xarelto 15mg  once daily - continue ASA 81mg  daily  Return in 2 months, sooner if needed.

## 2023-08-03 ENCOUNTER — Other Ambulatory Visit: Payer: Self-pay

## 2023-08-04 ENCOUNTER — Other Ambulatory Visit: Payer: Self-pay

## 2023-08-06 ENCOUNTER — Other Ambulatory Visit: Payer: Self-pay

## 2023-08-11 ENCOUNTER — Other Ambulatory Visit: Payer: Self-pay

## 2023-09-16 NOTE — Congregational Nurse Program (Signed)
  Dept: [phone]   Congregational Nurse Program Note  Date of Encounter: 09/15/2023 Late Entry visit made on 09/15/23 Client to [facility] nurse led clinic with request for blood pressure check. Client BP very elevated, he reports he has not taken his BP medication today, will take when he leaves. He also reports he is current with his appointments at the [facility] an has an upcoming apt on 1/23 at 7 pm. He reports his medications are provided through the [facility] pharmacy. He is also followed at the Heart Failure Clinic by [facility] FNP. Next apt Feb. 6th. Madolyn Frieze Past Medical History: Past Medical History:  Diagnosis Date   Arrhythmia    atrial fibrillation   Asthma    CHF (congestive heart failure) (HCC)    CKD (chronic kidney disease), stage III (HCC)    Cocaine abuse (HCC)    Diverticulitis    Hypertension    Tobacco use disorder     Encounter Details:  Community Questionnaire - 09/15/23 1200       Questionnaire   Ask client: Do you give verbal consent for me to treat you today? Yes    Student Assistance N/A    Location Patient Served  [facility]    Encounter Setting CN site    Population Status Unknown    Insurance Uninsured ([employer]/Self-Pay/Medicaid Family Planning)    Insurance/Financial Assistance Referral N/A    Medication N/A    Medical Provider Yes   current with [facility] ahd Heart failyure clinic   Screening Referrals Made N/A    Medical Referrals Made N/A    Medical Appointment Completed N/A    CNP Interventions Advocate/Support;Educate    Screenings CN Performed Blood Pressure    ED Visit Averted N/A    Life-Saving Intervention Made N/A

## 2023-09-17 ENCOUNTER — Ambulatory Visit: Payer: Medicaid Other

## 2023-09-30 NOTE — Progress Notes (Deleted)
 Advanced Heart Failure Clinic Note   PCP: Iloabachie, Chioma, NP @ [facility] (last seen 11/24) Primary Cardiologist: End, Lonni, MD (last seen 01/24)  Chief Complaint:  HPI:  Mr Radel is a 48 y/o male with a history of HTN, tobacco use, substance abuse, rapid atrial fibrillation and chronic heart failure.    Has not been admitted or been in the ED in the last 6 months.   Echo 04/19/16: EF 55-65% with mild LVH Echo 01/13/21: EF 60-65% along with severe LVH.  Echo 03/04/22: EF 60-65% along with severe LVH.  He presents today with a chief complaint of minimal shortness of breath with moderate exertion. Has no other symptoms and specifically denies fatigue, chest pain, cough, palpitations, abdominal distention, pedal edema, dizziness or difficulty sleeping. Does report being under a lot of stress as his mom died two weeks ago. He did use cocaine 2 days ago to deal with the stress. His mom lived with him and he's somewhat estranged from his brother.   At last visit, spironolactone  was decreased to 12.5mg  daily. At recent PCP appointment, sleep study and nephrology referrals were placed.   ROS: All systems negative except as listed in HPI, PMH and Problem List.  SH:  Social History   Socioeconomic History   Marital status: Single    Spouse name: Not on file   Number of children: Not on file   Years of education: Not on file   Highest education level: Not on file  Occupational History   Not on file  Tobacco Use   Smoking status: Some Days    Current packs/day: 1.00    Types: Cigarettes   Smokeless tobacco: Never   Tobacco comments:    2-3 cigarettes   Vaping Use   Vaping status: Never Used  Substance and Sexual Activity   Alcohol use: Not Currently    Comment: occas   Drug use: Yes    Types: Marijuana, Cocaine    Comment: marijuana daily, cocaine only on really painful days   Sexual activity: Yes  Other Topics Concern   Not on file  Social History  Narrative   Not on file   Social Drivers of Health   Financial Resource Strain: Medium Risk (06/10/2023)   Overall Financial Resource Strain (CARDIA)    Difficulty of Paying Living Expenses: Somewhat hard  Food Insecurity: Food Insecurity Present (07/16/2023)   Hunger Vital Sign    Worried About Running Out of Food in the Last Year: Sometimes true    Ran Out of Food in the Last Year: Sometimes true  Transportation Needs: Unmet Transportation Needs (06/10/2023)   PRAPARE - Administrator, Civil Service (Medical): Yes    Lack of Transportation (Non-Medical): Yes  Physical Activity: Insufficiently Active (06/10/2023)   Exercise Vital Sign    Days of Exercise per Week: 3 days    Minutes of Exercise per Session: 30 min  Stress: Stress Concern Present (06/10/2023)   [employer] of Occupational Health - Occupational Stress Questionnaire    Feeling of Stress : To some extent  Social Connections: Socially Isolated (06/10/2023)   Social Connection and Isolation Panel [NHANES]    Frequency of Communication with Friends and Family: More than three times a week    Frequency of Social Gatherings with Friends and Family: Never    Attends Religious Services: Never    Database Administrator or Organizations: No    Attends Banker Meetings: Never    Marital  Status: Never married  Intimate Partner Violence: Not At Risk (06/10/2023)   Humiliation, Afraid, Rape, and Kick questionnaire    Fear of Current or Ex-Partner: No    Emotionally Abused: No    Physically Abused: No    Sexually Abused: No    FH:  Family History  Problem Relation Age of Onset   Hypertension Mother     Past Medical History:  Diagnosis Date   Arrhythmia    atrial fibrillation   Asthma    CHF (congestive heart failure) (HCC)    CKD (chronic kidney disease), stage III (HCC)    Cocaine abuse (HCC)    Diverticulitis    Hypertension    Tobacco use disorder     Current Outpatient  Medications  Medication Sig Dispense Refill   amLODipine  (NORVASC ) 10 MG tablet Take 1 tablet (10 mg total) by mouth daily. 90 tablet 3   aspirin  EC 81 MG EC tablet Take 1 tablet (81 mg total) by mouth daily. Swallow whole. 30 tablet 11   atorvastatin  (LIPITOR ) 80 MG tablet Take 1 tablet (80 mg total) by mouth daily. 90 tablet 3   dapagliflozin  propanediol (FARXIGA ) 10 MG TABS tablet Take 1 tablet (10 mg total) by mouth daily before breakfast. 30 tablet 5   furosemide  (LASIX ) 40 MG tablet Take 1 tablet (40 mg total) by mouth once daily. 30 tablet 5   isosorbide  mononitrate (IMDUR ) 30 MG 24 hr tablet Take 1 tablet (30 mg total) by mouth once daily. 90 tablet 3   metoprolol  (TOPROL  XL) 200 MG 24 hr tablet Take 1 tablet (200 mg total) by mouth daily. 90 tablet 3   Rivaroxaban  (XARELTO ) 15 MG TABS tablet Take 1 tablet (15 mg total) by mouth daily. 90 tablet 3   spironolactone  (ALDACTONE ) 25 MG tablet Take 0.5 tablets (12.5 mg total) by mouth daily. 45 tablet 1   valsartan  (DIOVAN ) 160 MG tablet Take 1 tablet (160 mg total) by mouth daily. 90 tablet 3   No current facility-administered medications for this visit.   There were no vitals filed for this visit.  Wt Readings from Last 3 Encounters:  07/30/23 284 lb (128.8 kg)  07/16/23 280 lb 3.2 oz (127.1 kg)  06/12/23 281 lb (127.5 kg)   Lab Results  Component Value Date   CREATININE 2.90 (H) 07/16/2023   CREATININE 2.61 (H) 06/10/2023   CREATININE 2.25 (H) 03/05/2023    PHYSICAL EXAM:  General:  Well appearing. No resp difficulty HEENT: normal Neck: supple. JVP flat. No lymphadenopathy or thryomegaly appreciated. Cor: PMI normal. Regular rate & rhythm. No rubs, gallops or murmurs. Lungs: clear Abdomen: soft, nontender, nondistended. No hepatosplenomegaly. No bruits or masses.  Extremities: no cyanosis, clubbing, rash, edema Neuro: alert & oriented x3, cranial nerves grossly intact. Moves all 4 extremities w/o difficulty. Affect  pleasant.   ECG: not done   ASSESSMENT & PLAN:  1: NICM with preserved ejection fraction- - likely HTN/ AF/ cocaine use - NYHA class II - euvolemic today - weighing daily; reminded to call for an overnight weight gain of > 2 pounds or a weekly weight gain of > 5 pounds - weight up 3 pounds from last visit here 6 weeks ago - Echo 04/19/16: EF 55-65% with mild LVH - Echo 01/13/21: EF of 60-65% along with severe LVH.  - echo 03/04/22: EF of 60-65% along with severe LVH. - would like to get updated echo if he's able to get insurance - does not add salt to  his food; reviewed the importance of reading food labels for sodium content - continue farxiga  10mg  daily - continue furosemide  40mg  daily - continue isosorbide  MN 30mg  daily - continue metoprolol  200mg  daily - continue spironolactone  12.5mg  daily; may need to stop this if renal function continues to worsen - continue valsartan  160mg  daily - BNP 03/04/22 was 713.0  2: HTN with CKD- - BP 152/88 - continue amlodipine  10mg  daily - continue metoprolol , spironolactone , isosorbide  and valsartan  - would like to add hydralazine  for his BP but he says that taking meds >once a day doesn't work for him - consider renal ultrasound but he currently has no insurance coverage - saw PCP @ [facility] 11/24 - BMP 07/16/23 reviewed and showed sodium 143, potassium 4.2, creatinine 2.9 and GFR 26 - PCP recently placed nephrology referral  3: Tobacco use- - smoking marijuana - complete cessation discussed for 3 minutes with him  4: Cocaine abuse- - says that he occasionally uses cocaine when his back pain is so severe that he can't stand it - last cocaine use was a couple of days ago to deal with the death of his mom  5: Atrial fibrillation- - saw cardiology ([facility]) 01/24 - continue xarelto  15mg  once daily - continue ASA 81mg  daily  Return in 2 months, sooner if needed.      Ellouise DELENA Class, FNP 09/30/23

## 2023-10-01 ENCOUNTER — Telehealth: Payer: Self-pay | Admitting: Family

## 2023-10-01 ENCOUNTER — Encounter: Payer: Self-pay | Admitting: Family

## 2023-10-01 NOTE — Telephone Encounter (Signed)
 Lvm for pt to r/s appt missed on 10/01/23

## 2023-10-01 NOTE — Telephone Encounter (Signed)
 Patient did not show for his Heart Failure Clinic appointment on 10/01/23.

## 2023-10-13 ENCOUNTER — Emergency Department: Payer: Medicaid Other

## 2023-10-13 ENCOUNTER — Observation Stay
Admission: EM | Admit: 2023-10-13 | Discharge: 2023-10-16 | Disposition: A | Discharge status: Home / Self Care | Payer: Medicaid Other | Attending: Internal Medicine | Admitting: Internal Medicine

## 2023-10-13 ENCOUNTER — Observation Stay: Admit: 2023-10-13 | Payer: Medicaid Other

## 2023-10-13 ENCOUNTER — Other Ambulatory Visit: Payer: Self-pay

## 2023-10-13 DIAGNOSIS — E669 Obesity, unspecified: Secondary | ICD-10-CM | POA: Diagnosis not present

## 2023-10-13 DIAGNOSIS — I130 Hypertensive heart and chronic kidney disease with heart failure and stage 1 through stage 4 chronic kidney disease, or unspecified chronic kidney disease: Secondary | ICD-10-CM | POA: Diagnosis not present

## 2023-10-13 DIAGNOSIS — I213 ST elevation (STEMI) myocardial infarction of unspecified site: Secondary | ICD-10-CM | POA: Diagnosis not present

## 2023-10-13 DIAGNOSIS — R9431 Abnormal electrocardiogram [ECG] [EKG]: Secondary | ICD-10-CM | POA: Diagnosis not present

## 2023-10-13 DIAGNOSIS — I160 Hypertensive urgency: Secondary | ICD-10-CM | POA: Diagnosis not present

## 2023-10-13 DIAGNOSIS — Z5900 Homelessness unspecified: Secondary | ICD-10-CM | POA: Diagnosis not present

## 2023-10-13 DIAGNOSIS — Z8616 Personal history of COVID-19: Secondary | ICD-10-CM | POA: Diagnosis not present

## 2023-10-13 DIAGNOSIS — E1169 Type 2 diabetes mellitus with other specified complication: Secondary | ICD-10-CM | POA: Insufficient documentation

## 2023-10-13 DIAGNOSIS — E1122 Type 2 diabetes mellitus with diabetic chronic kidney disease: Secondary | ICD-10-CM | POA: Insufficient documentation

## 2023-10-13 DIAGNOSIS — Z6841 Body Mass Index (BMI) 40.0 and over, adult: Secondary | ICD-10-CM | POA: Insufficient documentation

## 2023-10-13 DIAGNOSIS — N1831 Chronic kidney disease, stage 3a: Secondary | ICD-10-CM | POA: Insufficient documentation

## 2023-10-13 DIAGNOSIS — R0789 Other chest pain: Secondary | ICD-10-CM | POA: Diagnosis not present

## 2023-10-13 DIAGNOSIS — Z7984 Long term (current) use of oral hypoglycemic drugs: Secondary | ICD-10-CM | POA: Insufficient documentation

## 2023-10-13 DIAGNOSIS — I499 Cardiac arrhythmia, unspecified: Secondary | ICD-10-CM | POA: Diagnosis not present

## 2023-10-13 DIAGNOSIS — I4891 Unspecified atrial fibrillation: Secondary | ICD-10-CM | POA: Diagnosis not present

## 2023-10-13 DIAGNOSIS — I5033 Acute on chronic diastolic (congestive) heart failure: Secondary | ICD-10-CM | POA: Insufficient documentation

## 2023-10-13 DIAGNOSIS — F1910 Other psychoactive substance abuse, uncomplicated: Secondary | ICD-10-CM | POA: Diagnosis not present

## 2023-10-13 DIAGNOSIS — I161 Hypertensive emergency: Secondary | ICD-10-CM | POA: Diagnosis not present

## 2023-10-13 DIAGNOSIS — N183 Chronic kidney disease, stage 3 unspecified: Secondary | ICD-10-CM | POA: Diagnosis not present

## 2023-10-13 DIAGNOSIS — Z79899 Other long term (current) drug therapy: Secondary | ICD-10-CM | POA: Insufficient documentation

## 2023-10-13 DIAGNOSIS — I5030 Unspecified diastolic (congestive) heart failure: Secondary | ICD-10-CM | POA: Diagnosis not present

## 2023-10-13 DIAGNOSIS — Z7982 Long term (current) use of aspirin: Secondary | ICD-10-CM | POA: Diagnosis not present

## 2023-10-13 DIAGNOSIS — R079 Chest pain, unspecified: Principal | ICD-10-CM

## 2023-10-13 DIAGNOSIS — Z7901 Long term (current) use of anticoagulants: Secondary | ICD-10-CM | POA: Insufficient documentation

## 2023-10-13 DIAGNOSIS — R0689 Other abnormalities of breathing: Secondary | ICD-10-CM | POA: Diagnosis not present

## 2023-10-13 DIAGNOSIS — F1410 Cocaine abuse, uncomplicated: Secondary | ICD-10-CM | POA: Insufficient documentation

## 2023-10-13 DIAGNOSIS — I5032 Chronic diastolic (congestive) heart failure: Secondary | ICD-10-CM | POA: Diagnosis present

## 2023-10-13 DIAGNOSIS — J45909 Unspecified asthma, uncomplicated: Secondary | ICD-10-CM | POA: Diagnosis not present

## 2023-10-13 DIAGNOSIS — F17210 Nicotine dependence, cigarettes, uncomplicated: Secondary | ICD-10-CM | POA: Diagnosis not present

## 2023-10-13 DIAGNOSIS — I480 Paroxysmal atrial fibrillation: Secondary | ICD-10-CM

## 2023-10-13 DIAGNOSIS — I440 Atrioventricular block, first degree: Secondary | ICD-10-CM | POA: Diagnosis not present

## 2023-10-13 LAB — URINE DRUG SCREEN, QUALITATIVE (ARMC ONLY)
Amphetamines, Ur Screen: NOT DETECTED
Barbiturates, Ur Screen: NOT DETECTED
Benzodiazepine, Ur Scrn: NOT DETECTED
Cannabinoid 50 Ng, Ur ~~LOC~~: POSITIVE — AB
Cocaine Metabolite,Ur ~~LOC~~: POSITIVE — AB
MDMA (Ecstasy)Ur Screen: NOT DETECTED
Methadone Scn, Ur: NOT DETECTED
Opiate, Ur Screen: NOT DETECTED
Phencyclidine (PCP) Ur S: NOT DETECTED
Tricyclic, Ur Screen: NOT DETECTED

## 2023-10-13 LAB — BASIC METABOLIC PANEL
Anion gap: 14 (ref 5–15)
BUN: 32 mg/dL — ABNORMAL HIGH (ref 6–20)
CO2: 19 mmol/L — ABNORMAL LOW (ref 22–32)
Calcium: 9.4 mg/dL (ref 8.9–10.3)
Chloride: 106 mmol/L (ref 98–111)
Creatinine, Ser: 2.1 mg/dL — ABNORMAL HIGH (ref 0.61–1.24)
GFR, Estimated: 38 mL/min — ABNORMAL LOW (ref >60)
Glucose, Bld: 106 mg/dL — ABNORMAL HIGH (ref 70–99)
Potassium: 3.6 mmol/L (ref 3.5–5.1)
Sodium: 139 mmol/L (ref 135–145)

## 2023-10-13 LAB — HIV ANTIBODY (ROUTINE TESTING W REFLEX): HIV Screen 4th Generation wRfx: NONREACTIVE

## 2023-10-13 LAB — TROPONIN I (HIGH SENSITIVITY)
Troponin I (High Sensitivity): 50 ng/L — ABNORMAL HIGH (ref <18)
Troponin I (High Sensitivity): 53 ng/L — ABNORMAL HIGH (ref <18)

## 2023-10-13 LAB — CBC
HCT: 50.8 % (ref 39.0–52.0)
Hemoglobin: 16 g/dL (ref 13.0–17.0)
MCH: 26.5 pg (ref 26.0–34.0)
MCHC: 31.5 g/dL (ref 30.0–36.0)
MCV: 84.2 fL (ref 80.0–100.0)
Platelets: 231 10*3/uL (ref 150–400)
RBC: 6.03 MIL/uL — ABNORMAL HIGH (ref 4.22–5.81)
RDW: 16.1 % — ABNORMAL HIGH (ref 11.5–15.5)
WBC: 8.1 10*3/uL (ref 4.0–10.5)
nRBC: 0 % (ref 0.0–0.2)

## 2023-10-13 LAB — CBG MONITORING, ED
Glucose-Capillary: 127 mg/dL — ABNORMAL HIGH (ref 70–99)
Glucose-Capillary: 149 mg/dL — ABNORMAL HIGH (ref 70–99)

## 2023-10-13 LAB — GLUCOSE, CAPILLARY: Glucose-Capillary: 189 mg/dL — ABNORMAL HIGH (ref 70–99)

## 2023-10-13 LAB — BRAIN NATRIURETIC PEPTIDE: B Natriuretic Peptide: 265.1 pg/mL — ABNORMAL HIGH (ref 0.0–100.0)

## 2023-10-13 MED ORDER — NITROGLYCERIN 2 % TD OINT
1.0000 [in_us] | TOPICAL_OINTMENT | Freq: Once | TRANSDERMAL | Status: AC
  Administered 2023-10-13: 1 [in_us] via TOPICAL
  Filled 2023-10-13: qty 1

## 2023-10-13 MED ORDER — RIVAROXABAN 15 MG PO TABS
15.0000 mg | ORAL_TABLET | Freq: Every day | ORAL | Status: DC
  Administered 2023-10-13: 15 mg via ORAL
  Filled 2023-10-13 (×2): qty 1

## 2023-10-13 MED ORDER — HYDRALAZINE HCL 20 MG/ML IJ SOLN
10.0000 mg | Freq: Once | INTRAMUSCULAR | Status: AC
  Administered 2023-10-13: 10 mg via INTRAVENOUS
  Filled 2023-10-13: qty 1

## 2023-10-13 MED ORDER — AMLODIPINE BESYLATE 10 MG PO TABS
10.0000 mg | ORAL_TABLET | Freq: Every day | ORAL | Status: DC
  Administered 2023-10-13: 10 mg via ORAL
  Filled 2023-10-13: qty 2

## 2023-10-13 MED ORDER — INSULIN ASPART 100 UNIT/ML IJ SOLN
0.0000 [IU] | Freq: Three times a day (TID) | INTRAMUSCULAR | Status: DC
  Administered 2023-10-13: 1 [IU] via SUBCUTANEOUS
  Administered 2023-10-14: 2 [IU] via SUBCUTANEOUS
  Administered 2023-10-14: 3 [IU] via SUBCUTANEOUS
  Administered 2023-10-15: 2 [IU] via SUBCUTANEOUS
  Administered 2023-10-16: 1 [IU] via SUBCUTANEOUS
  Filled 2023-10-13 (×5): qty 1

## 2023-10-13 MED ORDER — ASPIRIN 81 MG PO TBEC
81.0000 mg | DELAYED_RELEASE_TABLET | Freq: Every day | ORAL | Status: DC
  Administered 2023-10-13 – 2023-10-16 (×4): 81 mg via ORAL
  Filled 2023-10-13 (×4): qty 1

## 2023-10-13 MED ORDER — HYDRALAZINE HCL 20 MG/ML IJ SOLN
20.0000 mg | INTRAMUSCULAR | Status: DC | PRN
  Administered 2023-10-14 – 2023-10-15 (×4): 20 mg via INTRAVENOUS
  Filled 2023-10-13 (×4): qty 1

## 2023-10-13 MED ORDER — SODIUM CHLORIDE 0.9% FLUSH
3.0000 mL | Freq: Two times a day (BID) | INTRAVENOUS | Status: DC
  Administered 2023-10-13 – 2023-10-16 (×7): 3 mL via INTRAVENOUS

## 2023-10-13 MED ORDER — FUROSEMIDE 10 MG/ML IJ SOLN
20.0000 mg | Freq: Once | INTRAMUSCULAR | Status: AC
  Administered 2023-10-13: 20 mg via INTRAVENOUS
  Filled 2023-10-13: qty 4

## 2023-10-13 MED ORDER — SPIRONOLACTONE 12.5 MG HALF TABLET
12.5000 mg | ORAL_TABLET | Freq: Every day | ORAL | Status: DC
  Administered 2023-10-13 – 2023-10-16 (×4): 12.5 mg via ORAL
  Filled 2023-10-13 (×5): qty 1

## 2023-10-13 MED ORDER — ONDANSETRON HCL 4 MG/2ML IJ SOLN: 4.0000 mg | Freq: Four times a day (QID) | INTRAMUSCULAR | Status: DC | PRN

## 2023-10-13 MED ORDER — FUROSEMIDE 40 MG PO TABS
40.0000 mg | ORAL_TABLET | Freq: Every day | ORAL | Status: DC
  Administered 2023-10-13 – 2023-10-16 (×4): 40 mg via ORAL
  Filled 2023-10-13 (×4): qty 1

## 2023-10-13 MED ORDER — MORPHINE SULFATE (PF) 2 MG/ML IV SOLN: 2.0000 mg | INTRAVENOUS | Status: DC | PRN

## 2023-10-13 MED ORDER — NITROGLYCERIN 0.4 MG SL SUBL: 0.4000 mg | SUBLINGUAL_TABLET | SUBLINGUAL | Status: DC | PRN

## 2023-10-13 MED ORDER — ONDANSETRON HCL 4 MG PO TABS: 4.0000 mg | ORAL_TABLET | Freq: Four times a day (QID) | ORAL | Status: DC | PRN

## 2023-10-13 MED ORDER — SODIUM CHLORIDE 0.9 % IV SOLN: 250.0000 mL | INTRAVENOUS | Status: AC | PRN

## 2023-10-13 MED ORDER — HYDRALAZINE HCL 20 MG/ML IJ SOLN
10.0000 mg | INTRAMUSCULAR | Status: DC | PRN
  Administered 2023-10-13 (×2): 10 mg via INTRAVENOUS
  Filled 2023-10-13 (×2): qty 1

## 2023-10-13 MED ORDER — ATORVASTATIN CALCIUM 80 MG PO TABS
80.0000 mg | ORAL_TABLET | Freq: Every day | ORAL | Status: DC
  Administered 2023-10-13 – 2023-10-16 (×4): 80 mg via ORAL
  Filled 2023-10-13: qty 4
  Filled 2023-10-13 (×3): qty 1

## 2023-10-13 MED ORDER — SODIUM CHLORIDE 0.9% FLUSH: 3.0000 mL | INTRAVENOUS | Status: DC | PRN

## 2023-10-13 NOTE — Assessment & Plan Note (Signed)
BP 170s/100s in setting of polysubstance abuse including cocaine and missed medications for multiple days  Prn IV hydralazine  Resume home meds including norvasc Hold metoprolol for now in setting of acute cocaine use.  Monitor

## 2023-10-13 NOTE — Assessment & Plan Note (Signed)
Rate controlled at present  Cont xarelto  Titrate BB Monitor

## 2023-10-13 NOTE — Assessment & Plan Note (Addendum)
+   self reported polysubstance abuse including cocaine Check UDS to correlate  Patient interested in detox [facility] consult Monitor

## 2023-10-13 NOTE — Assessment & Plan Note (Signed)
 Stable from a resp standpoint  Cont home inhalers

## 2023-10-13 NOTE — ED Triage Notes (Signed)
Pt to ED via EMS, pt reports intermittent chest pain for the past few days, pain is dull in the center of his chest. Chest pain began while he was walking, pt is homeless. Pt was given 325 asa by EMS. Pt has hx CHF, pt reports some shortness of breath.

## 2023-10-13 NOTE — Progress Notes (Signed)
Heart Failure Nurse Navigator Progress Note  PCP: Pcp, No PCP-Cardiologist: Yvonne Kendall, MD Admission Diagnosis: Chest pain, unspecified type Abnormal EKG Hypertensive urgency, malignant Admitted from: Via EMS  Presentation:   Brad [name]. presented with intermittent chest pain for the past few days. Shortness of breath and worsening pedal edema.  BNP 265.1  ECHO/ LVEF: 10/13/23 results pending  Clinical Course:  Past Medical History:  Diagnosis Date   Arrhythmia    atrial fibrillation   Asthma    CHF (congestive heart failure) (HCC)    CKD (chronic kidney disease), stage III (HCC)    Cocaine abuse (HCC)    Diverticulitis    Hypertension    Tobacco use disorder      Social History   Socioeconomic History   Marital status: Single    Spouse name: Not on file   Number of children: 1   Years of education: Not on file   Highest education level: 7th grade  Occupational History   Not on file  Tobacco Use   Smoking status: Some Days    Current packs/day: 1.00    Types: Cigarettes   Smokeless tobacco: Never   Tobacco comments:    2-3 cigarettes   Vaping Use   Vaping status: Never Used  Substance and Sexual Activity   Alcohol use: Not Currently    Comment: occas   Drug use: Yes    Types: Marijuana, Cocaine    Comment: marijuana daily, cocaine only on really painful days   Sexual activity: Yes  Other Topics Concern   Not on file  Social History Narrative   Not on file   Social Drivers of Health   Financial Resource Strain: High Risk (10/13/2023)   Overall Financial Resource Strain (CARDIA)    Difficulty of Paying Living Expenses: Very hard  Food Insecurity: Food Insecurity Present (10/13/2023)   Hunger Vital Sign    Worried About Running Out of Food in the Last Year: Often true    Ran Out of Food in the Last Year: Often true  Transportation Needs: Unmet Transportation Needs (10/13/2023)   PRAPARE - Administrator, Civil Service (Medical):  Yes    Lack of Transportation (Non-Medical): Yes  Physical Activity: Insufficiently Active (06/10/2023)   Exercise Vital Sign    Days of Exercise per Week: 3 days    Minutes of Exercise per Session: 30 min  Stress: Stress Concern Present (06/10/2023)   [employer] of Occupational Health - Occupational Stress Questionnaire    Feeling of Stress : To some extent  Social Connections: Socially Isolated (10/13/2023)   Social Connection and Isolation Panel [NHANES]    Frequency of Communication with Friends and Family: More than three times a week    Frequency of Social Gatherings with Friends and Family: Never    Attends Religious Services: Never    Database administrator or Organizations: No    Attends Engineer, structural: Never    Marital Status: Never married   Education Assessment and Provision:  Detailed education and instructions provided on heart failure disease management including the following:  Signs and symptoms of Heart Failure When to call the physician Importance of daily weights Low sodium diet Fluid restriction Medication management Anticipated future follow-up appointments  Patient education given on each of the above topics.  Patient acknowledges understanding via teach back method and acceptance of all instructions.  Education Materials:  "Living Better With Heart Failure" Booklet, HF zone tool, & Daily Weight  Tracker Tool.  Patient has scale at home: No. Homeless Patient has pill box at home: No.    High Risk Criteria for Readmission and/or Poor Patient Outcomes: Heart failure hospital admissions (last 6 months): 0  No Show rate: 18 Difficult social situation: Homeless. No transportation. No phone. No medications. Would like to apply for disability. Demonstrates medication adherence: No-meds are missing and had not taken them for 3 days prior to hospitalization. Primary Language: English Literacy level: 7th Grade  Barriers of Care:   Diet &  Fluid Restrictions Daily Weights Medication Compliance Drug Use Smoking Cessation  Considerations/Referrals:   Referral made to Heart Failure Pharmacist Stewardship: Yes-Meds to bed Referral made to Heart Failure CSW/NCM TOC: Luther Hearing, LCSW & SW from [facility] Referral made to Heart & Vascular TOC clinic: Yes.  10/21/23 @   Items for Follow-up on DC/TOC: Diet & Fluid Restrictions Daily Weights Medication Compliance Continued Heart Failure Education CSW consult for Homeless, No transportation, No phone, No medications, Disability application (maybe [facility]), Polysubstance use, Probation.   Roxy Horseman, RN, BSN [facility] Heart Failure Navigator Secure Chat Only

## 2023-10-13 NOTE — Assessment & Plan Note (Signed)
2D ECHO 02/2022 w/ EF 60-65% and grade 3 DD  Subclinical volume overload on presentation  + 3 pitting edema on exam  IV lasix x1  Resume home diuretic 2D ECHO  Cardiology consultation as clinically indicated

## 2023-10-13 NOTE — ED Notes (Signed)
Called CCMD at [phone] to initiate cardiac monitoring.

## 2023-10-13 NOTE — Assessment & Plan Note (Signed)
Cr 2 w/ GFR in the 30s  Monitor renal function w/ diuresis
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
## 2023-10-13 NOTE — Plan of Care (Signed)
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
## 2023-10-13 NOTE — Assessment & Plan Note (Signed)
Blood sugar 100s  SSI  A1C  Monitor

## 2023-10-13 NOTE — ED Notes (Signed)
Patient with dyspnea on exertion upon returning from restroom. Denies chest pain.

## 2023-10-14 ENCOUNTER — Other Ambulatory Visit (HOSPITAL_COMMUNITY): Payer: Self-pay

## 2023-10-14 ENCOUNTER — Ambulatory Visit: Payer: Medicaid Other | Admitting: Family

## 2023-10-14 ENCOUNTER — Telehealth (HOSPITAL_COMMUNITY): Payer: Self-pay | Admitting: Pharmacy Technician

## 2023-10-14 ENCOUNTER — Observation Stay (HOSPITAL_BASED_OUTPATIENT_CLINIC_OR_DEPARTMENT_OTHER)
Admit: 2023-10-14 | Discharge: 2023-10-14 | Disposition: A | Discharge status: Home / Self Care | Payer: Medicaid Other | Attending: Family Medicine | Admitting: Family Medicine

## 2023-10-14 DIAGNOSIS — I160 Hypertensive urgency: Secondary | ICD-10-CM | POA: Diagnosis not present

## 2023-10-14 DIAGNOSIS — I5032 Chronic diastolic (congestive) heart failure: Secondary | ICD-10-CM

## 2023-10-14 DIAGNOSIS — F1910 Other psychoactive substance abuse, uncomplicated: Secondary | ICD-10-CM | POA: Diagnosis not present

## 2023-10-14 DIAGNOSIS — I480 Paroxysmal atrial fibrillation: Secondary | ICD-10-CM | POA: Diagnosis not present

## 2023-10-14 DIAGNOSIS — E1169 Type 2 diabetes mellitus with other specified complication: Secondary | ICD-10-CM

## 2023-10-14 DIAGNOSIS — N1831 Chronic kidney disease, stage 3a: Secondary | ICD-10-CM | POA: Diagnosis not present

## 2023-10-14 DIAGNOSIS — J4520 Mild intermittent asthma, uncomplicated: Secondary | ICD-10-CM

## 2023-10-14 LAB — CBC
HCT: 48.3 % (ref 39.0–52.0)
Hemoglobin: 15.8 g/dL (ref 13.0–17.0)
MCH: 26.4 pg (ref 26.0–34.0)
MCHC: 32.7 g/dL (ref 30.0–36.0)
MCV: 80.6 fL (ref 80.0–100.0)
Platelets: 230 10*3/uL (ref 150–400)
RBC: 5.99 MIL/uL — ABNORMAL HIGH (ref 4.22–5.81)
RDW: 15.7 % — ABNORMAL HIGH (ref 11.5–15.5)
WBC: 7 10*3/uL (ref 4.0–10.5)
nRBC: 0 % (ref 0.0–0.2)

## 2023-10-14 LAB — ECHOCARDIOGRAM COMPLETE
AR max vel: 2.81 cm2
AV Area VTI: 2.95 cm2
AV Area mean vel: 2.69 cm2
AV Mean grad: 6 mm[Hg]
AV Peak grad: 13 mm[Hg]
Ao pk vel: 1.8 m/s
Area-P 1/2: 6.54 cm2
Calc EF: 74.5 %
Height: 66 in
MV VTI: 4.43 cm2
S' Lateral: 2.9 cm
Single Plane A2C EF: 78.2 %
Single Plane A4C EF: 73.1 %
Weight: 4436.8 [oz_av]

## 2023-10-14 LAB — GLUCOSE, CAPILLARY
Glucose-Capillary: 211 mg/dL — ABNORMAL HIGH (ref 70–99)
Glucose-Capillary: 116 mg/dL — ABNORMAL HIGH (ref 70–99)
Glucose-Capillary: 181 mg/dL — ABNORMAL HIGH (ref 70–99)
Glucose-Capillary: 92 mg/dL (ref 70–99)

## 2023-10-14 LAB — COMPREHENSIVE METABOLIC PANEL
ALT: 13 U/L (ref 0–44)
AST: 15 U/L (ref 15–41)
Albumin: 3.6 g/dL (ref 3.5–5.0)
Alkaline Phosphatase: 56 U/L (ref 38–126)
Anion gap: 10 (ref 5–15)
BUN: 29 mg/dL — ABNORMAL HIGH (ref 6–20)
CO2: 23 mmol/L (ref 22–32)
Calcium: 9.3 mg/dL (ref 8.9–10.3)
Chloride: 107 mmol/L (ref 98–111)
Creatinine, Ser: 1.97 mg/dL — ABNORMAL HIGH (ref 0.61–1.24)
GFR, Estimated: 41 mL/min — ABNORMAL LOW (ref >60)
Glucose, Bld: 109 mg/dL — ABNORMAL HIGH (ref 70–99)
Potassium: 3.8 mmol/L (ref 3.5–5.1)
Sodium: 140 mmol/L (ref 135–145)
Total Bilirubin: 0.6 mg/dL (ref 0.0–1.2)
Total Protein: 7.1 g/dL (ref 6.5–8.1)

## 2023-10-14 MED ORDER — EMPAGLIFLOZIN 10 MG PO TABS
10.0000 mg | ORAL_TABLET | Freq: Every day | ORAL | Status: DC
  Administered 2023-10-14 – 2023-10-16 (×3): 10 mg via ORAL
  Filled 2023-10-14 (×3): qty 1

## 2023-10-14 MED ORDER — RIVAROXABAN 20 MG PO TABS
20.0000 mg | ORAL_TABLET | Freq: Every day | ORAL | Status: DC
  Administered 2023-10-14 – 2023-10-16 (×3): 20 mg via ORAL
  Filled 2023-10-14 (×3): qty 1

## 2023-10-14 MED ORDER — CARVEDILOL 6.25 MG PO TABS
6.2500 mg | ORAL_TABLET | Freq: Two times a day (BID) | ORAL | Status: DC
  Administered 2023-10-14 – 2023-10-15 (×2): 6.25 mg via ORAL
  Filled 2023-10-14 (×2): qty 1

## 2023-10-14 NOTE — TOC Initial Note (Addendum)
Transition of Care (TOC) - Initial/Assessment Note    Patient Details  Name: Brad [name]. MRN: [id number] Date of Birth: July 18, 1976  Transition of Care [facility]) CM/SW Contact:    Truddie Hidden, RN Phone Number: 10/14/2023, 11:29 AM  Clinical Narrative:                 TOC consulted for substance abuse.  RNCM spoke with patient regarding homelessness. Patient stated he admitted "From the streets." He lives from place to place with family and friends at times. SDOH screening indicated need for food, housing,and transportation.  Discussed food, housing and transportation. Patient would like to receive resources. He has been advised resources for contacts are being provided for food, housing, shelters, transportation and substance abuse.   Resources added to AVS for the following: food, housing, shelters, transportation and substance abuse.         Patient Goals and CMS Choice            Expected Discharge Plan and Services                                              Prior Living Arrangements/Services                       Activities of Daily Living   ADL Screening (condition at time of admission) Independently performs ADLs?: Yes (appropriate for developmental age) Is the patient deaf or have difficulty hearing?: No Does the patient have difficulty seeing, even when wearing glasses/contacts?: No Does the patient have difficulty concentrating, remembering, or making decisions?: No  Permission Sought/Granted                  Emotional Assessment              Admission diagnosis:  Abnormal EKG [R94.31] Hypertensive emergency [I16.1] Hypertensive urgency, malignant [I16.0] Chest pain, unspecified type [R07.9] Patient Active Problem List   Diagnosis Date Noted   Encounter to establish care 05/28/2023   Demand ischemia (HCC) 03/21/2022   Chronic heart failure with preserved ejection fraction (HFpEF) (HCC) 03/21/2022   Polysubstance  abuse (HCC) 03/21/2022   NSTEMI (non-ST elevated myocardial infarction) (HCC)    Cardiogenic shock (HCC) 03/04/2022   Atrial fibrillation (HCC)    Chronic pain of right ankle 05/10/2021   Morbid obesity (HCC) 04/10/2021   Hypertensive emergency 04/10/2021   Acute on chronic diastolic CHF (congestive heart failure) (HCC) 02/15/2021   Hypertensive kidney disease with stage 3a chronic kidney disease (HCC) 02/15/2021   Type 2 diabetes mellitus with morbid obesity (HCC) 02/15/2021   Tobacco dependence 02/15/2021   Cocaine abuse in remission (HCC) 02/15/2021   Marijuana user 02/15/2021   Acute pulmonary edema (HCC)    Acute heart failure (HCC) 01/12/2021   Asthma    Essential hypertension    Cocaine abuse (HCC) 04/17/2020   Pneumonia due to COVID-19 virus 04/16/2020   Acute hypoxemic respiratory failure due to COVID-19 (HCC) 04/16/2020   Benign essential HTN 04/16/2020   CKD (chronic kidney disease), stage IIIa 04/16/2020   Elevated troponin 04/16/2020   Acute renal failure superimposed on stage 3a chronic kidney disease (HCC)    Diverticulitis 10/06/2018   Hypertensive urgency 04/18/2016   Intractable nausea and vomiting 05/31/2015   PCP:  Pcp, No Pharmacy:   [facility]  [address] [place] [address] Phone: [phone] Fax: [phone]     Social Drivers of Health (SDOH) Social History: SDOH Screenings   Food Insecurity: Food Insecurity Present (10/13/2023)  Housing: High Risk (10/13/2023)  Transportation Needs: Unmet Transportation Needs (10/13/2023)  Utilities: Not At Risk (10/13/2023)  Alcohol Screen: Low Risk  (06/10/2023)  Depression (PHQ2-9): Medium Risk (06/10/2023)  Financial Resource Strain: High Risk (10/13/2023)  Physical Activity: Insufficiently Active (06/10/2023)  Social Connections: Socially Isolated (10/13/2023)  Stress: Stress Concern Present (06/10/2023)  Tobacco Use: High Risk (10/13/2023)  Health  Literacy: Adequate Health Literacy (10/13/2023)   SDOH Interventions: Housing Interventions: Intervention Not Indicated   Readmission Risk Interventions    04/12/2021    3:10 PM  Readmission Risk Prevention Plan  Transportation Screening Complete  PCP or Specialist Appt within 3-5 Days Complete  HRI or Home Care Consult Complete  Social Work Consult for Recovery Care Planning/Counseling Complete  Palliative Care Screening Complete  Medication Review Oceanographer) Complete

## 2023-10-14 NOTE — Progress Notes (Signed)
*  PRELIMINARY RESULTS* Echocardiogram 2D Echocardiogram has been performed.  Brad [name] 10/14/2023, 10:38 AM
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
## 2023-10-14 NOTE — Progress Notes (Signed)
[name]  PCP: Pcp, No PCP-Cardiologist: Yvonne Kendall, MD  HPI: Brad [name]. is a 48 y.o. male with obesity, polysubstance abuse, HFpEF, stage III CKD, hypertension, atrial fibrillation, T2DM who presented with chest pain, shortness of breath with minimal exertion, swelling, and headache. He has been unhoused since his mother passed. He has been without medications for several days. On admission, BNP was 265.1, HS-troponin was 50 > 53, and UDS positive for cocaine + cannabinoid. Chest x-ray noted no acute cardiopulmonary disease.   Pertinent cardiac history: Echo in 03/2016 showed LVEF 55-65%. Echo in 12/2020 with normal LVEF and grade I diastolic dysfunction. Echo in 02/2022 showed normal LVEF with severe LVH, grade III diastolic dysfunction. Stress test 02/2022 noted no significant ischemia.  Pertinent Lab Values: Creatinine, Ser  Date Value Ref Range Status  10/14/2023 1.97 (H) 0.61 - 1.24 mg/dL Final   BUN  Date Value Ref Range Status  10/14/2023 29 (H) 6 - 20 mg/dL Final  16/05/9603 43 (H) 6 - 24 mg/dL Final   Potassium  Date Value Ref Range Status  10/14/2023 3.8 3.5 - 5.1 mmol/L Final   Sodium  Date Value Ref Range Status  10/14/2023 140 135 - 145 mmol/L Final  07/16/2023 143 134 - 144 mmol/L Final   B Natriuretic Peptide  Date Value Ref Range Status  10/13/2023 265.1 (H) 0.0 - 100.0 pg/mL Final    Comment:    Performed at [facility], [address] [place], [place] [address]   Magnesium  Date Value Ref Range Status  03/06/2022 2.2 1.7 - 2.4 mg/dL Final    Comment:    Performed at [facility], [address] [place], [place] [address]   HbA1c, POC (controlled diabetic range)  Date Value Ref Range Status  01/31/2022 6.3 0.0 - 7.0 % Final   Hgb A1c MFr Bld  Date Value Ref Range Status  06/10/2023 6.4 (H) 4.8 - 5.6 % Final    Comment:             Prediabetes: 5.7 - 6.4          Diabetes: >6.4           Glycemic control for adults with diabetes: <7.0    TSH  Date Value Ref Range Status  03/04/2022 2.710 0.350 - 4.500 uIU/mL Final    Comment:    Performed by a 3rd Generation assay with a functional sensitivity of <=0.01 uIU/mL. Performed at [facility], [address] [place], [place] [address]     Vital Signs: Temp:  [97.8 F (36.6 C)-98.5 F (36.9 C)] 97.8 F (36.6 C) (02/19 0906) Pulse Rate:  [70-102] 102 (02/19 0906) Cardiac Rhythm: Atrial fibrillation;Bundle branch block (02/19 0829) Resp:  [16-29] 18 (02/19 0906) BP: (118-178)/(74-110) 145/74 (02/19 0906) SpO2:  [91 %-99 %] 95 % (02/19 0906) Weight:  [125.8 kg (277 lb 4.8 oz)] 125.8 kg (277 lb 4.8 oz) (02/19 0517)  Intake/Output Summary (Last 24 hours) at 10/14/2023 1116 Last data filed at 10/14/2023 1100 Gross per 24 hour  Intake 120 ml  Output 1000 ml  Net -880 ml    Current Heart Failure Medications:  Loop diuretic: furosemide 40 mg PO daily Beta-Blocker: none ACEI/ARB/ARNI: none MRA: spironolactone 12.5 mg daily SGLT2i: none Other vasoactive medications include amlodipine 10 mg daily  Prior to admission Heart Failure Medications:  Loop diuretic: furosemide 40 mg daily Beta-Blocker: metoprolol succinate 200 mg daily ACEI/ARB/ARNI: valsartan 160 mg daily MRA:  spironolactone 12.5 mg daily SGLT2i: Other:  Assessment: 1. Acute on chronic diastolic heart failure (LVEF 60-65%) with grade III diastolic dysfunction, due to NICM. NYHA class II-III symptoms.  -Symptoms: Reports feeling better with restarting medications. Denies chest pain at rest. Denies shortness of breath and orthopnea. Denies LEE. -Volume: Appears euvolemic. -Hemodynamics: BP remains elevated, though better controlled than at admission. -SGLT2i: Starting Jardiance today. Prior authorization in progress. -MRA: Currently on spironolactone 12.5 mg daily given renal dysfunction. Continue at current dose for now. -ARNI: Would  benefit from [facility] start if creatinine remains stable.. -Starting carvedilol for uncontrolled hypertension. This is safe in cocaine use disorder. -Though no significant benefit to HFpEF, currently requiring amlodipine to maintain BP. This was given this morning. May require adding back tomorrow if remains hypertensive.  Plan: 1) Medication changes recommended at this time: -Spoke with provider and carvedilol 6.25 mg BID and Jardiance were added today.  -Can consider adding back Entresto tomorrow if BP remains high for BP, HFpEF, and renal protection so long as creatinine is stable or trending down.  2) Patient assistance: -Copay for all medications is $4  3) Education: - Patient has been educated on current HF medications and potential additions to HF medication regimen - Patient verbalizes understanding that over the next few months, these medication doses may change and more medications may be added to optimize HF regimen - Patient has been educated on basic disease state pathophysiology and goals of therapy  Medication Assistance / Insurance Benefits Check: Does the patient have prescription insurance?    Type of insurance plan:  Does the patient qualify for medication assistance through manufacturers or grants? No  Outpatient Pharmacy: Prior to admission outpatient pharmacy: [facility] Is the patient agreeable to switch to [facility]?: Yes Is the patient willing to utilize a [facility] pharmacy at discharge?: Yes  Please do not hesitate to reach out with questions or concerns,  Enos Fling, PharmD, CPP, BCPS Heart Failure Pharmacist  Phone - [phone] 10/14/2023 11:16 AM

## 2023-10-14 NOTE — Plan of Care (Signed)

## 2023-10-14 NOTE — Discharge Instructions (Addendum)
Food Resources  Agency Name: [facility] Address: [address] [place], [place] [address] Phone: [phone] Website: [email or web address] Service(s) Offered: Housing services, self-sufficiency, congregate meal program, weatherization program, Event organiser program, emergency food assistance,  housing counseling, home ownership program, wheels - to work program.  [employer] free for 60 and older at various locations from [employer], Monday-Friday:  [employer], [address] [place], [phone] -[facility], [address], [place] [phone]  -[facility], [address] [place] [phone]  -[address], [place], [phone]  Agency Name: [facility] Address: [phone], [place], [place] [address] Phone: [phone] Website: [email or web address] Service(s) Offered: Home delivered hot, frozen, and emergency  meals. Grocery assistance program which matches  volunteers one-on-one with seniors unable to grocery shop  for themselves. Must be 60 years and older; less than 20  hours of in-home aide service, limited or no driving ability;  live alone or with someone with a disability; live in  [place].  Agency Name: Ecologist [facility] [employer] [facility] [employer]) Address: [address] [place], [place] [address] Phone: [phone] Service(s) Offered: Food is served to shut-ins, homeless, elderly, and low income people in the community every Saturday (11:30 am-12:30 pm) and Sunday (12:30 pm-1:30pm). Volunteers also offer help and encouragement in seeking employment,  and spiritual guidance.  Agency Name: Department of Social Services Address: [address] [place], [place] [address] Phone: [phone] Service(s) Offered: Child support services; child welfare services; food stamps; Medicaid; work first family assistance; and aid  with fuel,  rent, food and medicine.  Agency Name: Dietitian Address: [address], [place], [place] Phone: [phone] Website: [email or web address] Services Offered: Monday 10:00am-12:00, 8:00pm-9:00pm, and Friday 10:00am-12:00.  Agency Name: [employer] [facility] [place] Address: [address], [place], [place] [address] Phone: [phone] Website: [email or web address] Service(s) Offered: Serves weekday meals, open from 11:30 am- 1:00 pm., and 6:30-7:30pm, Monday-Wednesday-Friday distributes food 3:30-6pm, Monday-Wednesday-Friday.  Agency Name: [facility] Address: [address] [place], [place] Phone: [phone] Website: [email or web address] Services Offered: Distributes food the 4th Saturday of the month, starting at 8:00 am  Agency Name: [facility] Address: [phone] [address], [place], [place] [address] Phone: [phone] Website: [email or web address] Service(s) Offered: Bread of life, weekly food pantry. Open Wednesdays from 10:00am-noon.  Agency Name: [facility] [employer] Address: [address] [place], [place], [place] Phone: [phone] Services Offered: Distributes food 9am-1pm, Monday-Thursday. Call for details.  Agency Name: [facility] Address: [address], [place], [place] [address] Phone: [phone] Website: [email or web address].[email or web address] Service(s) Offered: Games developer. Call for assistance.  Agency Name: [facility] Address: [address] [place], [place] [address] Phone: [phone] Service Offered: Emergency Food Pantry. Call for appointment.  Agency Name: [facility] Address: [address], [place], [place] [address] Phone: [phone] Website: [email or web address] Services Offered: Games developer. Call for details  Agency Name: [facility] Address: [address] [place], [place] Phone:  [phone] Website: [email or web address] Service(s) Offered: Emergency Food Pantry. Call for details.  Agency Name: Holiday representative Address: [address] [place], [place] [address] Phone: [phone] or [phone] Website: [email or web address] Service(s) Offered: Distribute food 9am-11:30 am, Tuesday-Friday, and 1-3:30pm, Monday-Friday. Food pantry Monday-Friday 1pm-3pm, fresh items, Mon.-Wed.-Fri.  Agency Name: [facility] Address: [address] [place], [place] [address] Phone: [phone] Website: [email or web address] Services Offered: Distribute food Tues and Sats from 9:00am-noon.  Closed 1st Saturday of each month. Call for details  Agency Name: [facility] [employer] Address: Reynaldo Minium [facility] [address], [place] [address] Phone: [phone]  Services Offered: Delivers meals every Thursday  Rent/Utility/Housing  Agency Name: [facility] Address: [name] [place], [place] [address] Phone: [phone] Email: [email or web address] Website: [email or web address] Service(s) Offered: Housing services, self-sufficiency, congregate meal program, weatherization program, Field seismologist program, emergency food assistance,  housing counseling, home ownership program, wheels -towork program.  Agency Name: [facility] Address: [address], [place], [place] [address] Phone: [phone] (8a-4p) [phone] (8p- 10p) Email: [email or web address] Website: [email or web address] Service(s) Offered: A program for homeless and/or needy men that includes one-on-one counseling, life skills training and job rehabilitation.  Agency Name: [employer] [facility] [place] Address: [address] [place], [place] [address] Phone: [phone] Website: www.[email or web address] Service(s) Offered: Assistance to needy in emergency with utility  bills, heating fuel, and prescriptions. Shelter for homeless 7pm-7am. December 18, 2016 15  Agency Name: [facility] [place] (Developmentally Disabled) Address: [address], [place], [place] [address] Phone: [phone] Contact Person: Cathleen Corti Email: [email or web address] Website: [email or web address] Service(s) Offered: Helps individuals with developmental disabilities move from housing that is more restrictive to homes where they  can achieve greater independence and have more  opportunities.  Agency Name: [facility] Address: [address], [place], [place] [address] Phone: [phone] Email: [email or web address] Website: www.[email or web address].org Service(s) Offered: Provides affordable housing for low-income families, elderly, and disabled individuals. Offer a wide range of  programs and services, from financial planning to afterschool and summer programs.  Agency Name: Department of Social Services Address: [address] [place], [place] [address] Phone: [phone] Service(s) Offered: Child support services; child welfare services; food stamps; Medicaid; work first family assistance; and aid with fuel,  rent, food and medicine.  Agency Name: [facility] [place] [employer]. Address: [facility] [address] [place], [place]  [address] Phone: [phone] Website: www.familyabuseservices.org Service(s) Offered: 24 hour Crisis Line: [phone]; 24 hour Emergency Shelter; Transitional Housing; Support Groups; Scientist, physiological; [employer]; Hispanic Outreach: [phone];  Visitation Center: [phone].  Agency Name: [facility] [place]. Address: [address] [place], [place] [address] Phone: [phone] Service(s) Offered: CAP Services; Home and [employer]; Individual or Group Supports; Respite Care Non-Institutional Nursing;  Residential Supports; Respite Care and Personal Care Services;  Transportation; Family and Friends Night; Recreational Activities; Three Nutritious Meals/Snacks; Consultation with Registered Dietician; Twenty-four hour Registered Nurse Access; Daily and [employer] and [employer]s; Camp Green Leaves; [place] for the [employer] (During Summer Months) Bingo Night (Every  Wednesday Night); Special Populations Dance Night  (Every Tuesday Night); Professional Hair Care Services.  Agency Name: [facility] Address: [address], [place], [place] [address] Phone: [phone] Contact Person: Jabier Mutton Website: [email or web address].Physicist, medical) Offered: Residential treatment facility for women; food and  clothing, educational & employment development and  transportation to work; Counsellor of financial skills;  parenting and family reunification; emotional and spiritual  support; transitional housing for program graduates.  Agency Name: [facility] Address: [address], [place], [place] [address] Phone: [phone] Email: [email or web address] Website: [email or web address].es Service(s) Offered: Public housing units for elderly, disabled, and low income people; housing choice vouchers for income eligible  applicants; shelter plus care vouchers; and Psychologist, clinical.  Agency Name: [facility] [employer] Address: [address] [place], [place] [address] Phone: [phone] Email: [email or web address] Website: www.habitatalamance.org Service(s)  Offered: Build houses for families in need of decent housing. Each adult in the family must invest 200 hours of labor on  someone else's house, work with volunteers to build their own house, attend classes on budgeting, home maintenance, yard care, and attend homeowner association meetings.  Agency Name: [facility] Address: [address] [place], [place] [address] Phone: [phone] Website:  www.rsli.org Service(s) Offered: Intermediate care facilities for intellectually delayed, Supervised Living in group homes for adults with developmental disabilities, Supervised Living for people who have dual diagnoses (MRMI), Independent Living, Supported Living, respite and a variety of CAP services, pre-vocational services, day supports, and [employer].  Agency Name: [place] [facility] Phone: [phone] Website: www.NCForeclosurePrevention.gov Service(s) Offered: Zero-interest, deferred loans to homeowners struggling to pay their mortgage. Call for more information.     Transportation Resources  Agency Name: [facility] Address: [name] [place], [place] [address] Phone: [phone] Email: [email or web address] Website: www.[email or web address].org Service(s) Offered: Housing services, self-sufficiency, congregate meal program, weatherization program, Field seismologist program, emergency food assistance,  housing counseling, home ownership program, wheels-towork program.  Agency Name: [facility] [employer] [phone]) Address: [address] [place], [place] [address] Phone: [phone] Website: [email or web address].[email or web address] Service(s) Offered: Transportation for [employer], subscription and demand response; Dial-a-Ride for citizens 3 years of age or older.  Agency Name: Department of Social Services Address: [address] [place], [place] [address] Phone: [phone] Service(s) Offered: Child support services; child welfare services; food stamps; Medicaid; work first family assistance; and aid with fuel,  rent, food and medicine, transportation assistance.  Agency Name: Disabled [employer] (DAV) Transportation  Network Phone: [phone] Service(s) Offered: Transports veterans to the [facility] medical center. Call  forty-eight hours in advance and leave the name,  telephone  number, date, and time of appointment. Veteran will be  contacted by the driver the day before the appointment to  arrange a pick up point   Transportation Resources  Agency Name: [facility] Address: [name] [place], [place] [address] Phone: [phone] Email: [email or web address] Website: www.[email or web address].org Service(s) Offered: Housing services, self-sufficiency, congregate meal program, weatherization program, Field seismologist program, emergency food assistance,  housing counseling, home ownership program, wheels-towork program.  Agency Name: [facility] [employer] [phone]) Address: [address], [place], [place] [address] Phone: [phone] Website: www.acta-.com Service(s) Offered: Transportation for [employer], subscription and demand response; Dial-a-Ride for citizens 6 years of age or older.  Agency Name: Department of Social Services Address: [address] [place], [place] [address] Phone: [phone] Service(s) Offered: Child support services; child welfare services; food stamps; Medicaid; work first family assistance; and aid with fuel,  rent, food and medicine, transportation assistance.  Agency Name: Disabled [employer] (DAV) Transportation  Network Phone: [phone] Service(s) Offered: Transports veterans to the [facility] medical center. Call  forty-eight hours in advance and leave the name, telephone  number, date, and time of appointment. Veteran will be  contacted by the driver the day before the appointment to  arrange a pick up point    [employer] ACTA currently provides door to door services. ACTA connects with PART daily for services to [facility]. ACTA also performs contract services to [employer] operates 27 vehicles, all but 3 mini-vans are equipped with lifts for special needs as well as the  general public. ACTA drivers are each CDL certified and trained in First Aid and CPR. ACTA was established in 2002 by  [place] Building services engineer. An independent Industrial/product designer. ACTA operates via Cytogeneticist with required Research scientist (physical sciences) from [place]. ACTA provides over 80,000 passenger trips each year, including [facility] and [employer] sites.  Call at least by 11 AM one business day prior to needing transportation  [employer].                      [place], [place] [address]     Office Hours: Monday-Friday  8 AM - 5 PM     Shelters Resource List  [employer] RESCUE MISSION PROVIDED BY: [facility] [address], [place], [place] Offers a faith-based shelter for homeless men, usually with substance use disorders. Residents receive counseling, life skills training, and help finding a job.  HOMELESS SHELTER PROVIDED BY: ALLIED CHURCHES OF [facility] [address] [place], [place], [place] Offers a shelter for men, women, and families experiencing homelessness. Food, clothing and other items are available for residents. Also offers support and services to help residents become self-sufficient. Offers temporary emergency housing for 30 days. Additional shelter may be available when temperatures drop below freezing but is not guaranteed.  FAMILY ABUSE SERVICES OF [facility] COUNTY PROVIDED BY: FAMILY ABUSE SERVICES OF [facility] [address], [place], [place] Offers services for victims of domestic violence. Offers a 24-hour crisis line and emergency shelter. Offers information and referrals to other community resources. Also offers court advocacy and support groups.  HOUSING CHOICE VOUCHER PROGRAM PROVIDED BY: [facility]OUS[facility]ITY - [place] [address], [place], [place] Offers vouchers for approved Section 8 properties. Vouchers offer financial help  with rent    FRUIT TREE MINISTRIES PROVIDED BY: [facility], [place], [place] Offers emergency shelter for victims of domestic violence. Also offers a 24-hour crisis hotline for victims of domestic violence, safety planning, information and referrals, case management, and support groups for victims of domestic violence.   ACT TOGETHER EMERGENCY SHELTER PROVIDED BY: [facility] [address], [place], [place] Offers a 21-day emergency shelter for youth experiencing a family crisis, abuse, or homelessness. Case management, supportive services, healthcare services, and more are available for residents. SHELTER PROVIDED BY: [facility] [address], [place], [place] Offers a homeless shelter for people and families. Meals, showers, community referrals, case management, and more are available for residents. HEARTH TRANSITIONAL LIVING PROGRAM PROVIDED BY: [facility] [address], [place], [place] Offers an 62-month homeless shelter for younger adults experiencing homelessness. Case management, independent living skills education, and more are available for residents. PARTNERSHIP VILLAGE PROVIDED BY: [place] [facility] [address], [place], [place] Offers transitional housing for families and single people experiencing homelessness. Residents meet regularly with a case manager to work towards self-sufficiency TRANSITIONAL HOUSING PROVIDED BY: [facility] [address], [place], [place] Offers transitional housing for male veterans with disabilities. Residents receive meals, transportation, and clothing. Also offers support groups, nutrition classes, and peer support to residents.   WEAVER HOUSE PROVIDED BY: [place] [facility] [address], [place], [place] Offers shelter to adult men and women. Guests receive hot meals and case management. Also offers overnight shelter when temperatures drop during cold winter  months  EMERGENCY FAMILY SHELTER PROVIDED BY: [facility] [place] [address], [place], [place] Offers shelter and support services for families experiencing homelessness.   Intensive Outpatient Programs   [facility] [phone]  [address],  [place], [place] [phone]  [facility] (Inpatient and outpatient)[phone] (Suboxone and Methadone) [address] [phone]  ADS: Alcohol & Drug [facility] Programs - Intensive Outpatient [address] [place], [place] [address]  [phone]  [facility]ship Margo Aye (Outpatient, Inpatient, Chemical Caring Services (Groups and Residental) (insurance only) [phone] [place], [place] [phone]   [facility] (for caregivers and family) [address] [name] [address], [place], [place] [phone]  Residential Treatment Programs  [facility] Work Farm(2 years) Residential: 1 days)[facility] ([facility] [address] [place], [place], [place] [phone] or [phone]  [facility]S Treatment [facility] [address] [place], [place], [place] [phone]   [facility] Facility[facility] Services (RTS) [address] [place], [place], [place] [phone] Admissions: 8am-3pm M-F  BATS Program: Residential Program [phone] Days)             ADATC: [facility]  [place], [place], [place]  [phone] or [phone] in Hours over the weekend or by referral)  [facility] [address] [place], [place]  [address] [phone] (Do virtual or phone assessment, offer transportation within 25 miles, have in patient and Outpatient options)   Mobil Crisis: Therapeutic Alternatives:[phone] (for crisis response 24 hours a day)

## 2023-10-14 NOTE — Telephone Encounter (Signed)
Patient Product/process development scientist completed.    The patient is insured through [employer].     Ran test claim for Eliquis 5 mg and the current 30 day co-pay is $4.00.  Ran test claim for Xarelto 20 mg and the current 30 day co-pay is $4.00.  Ran test claim for Entresto 24-26 mg and the current 30 day co-pay is $4.00.  Ran test claim for Farxgia 10 mg and Requires Prior Authorization  Ran test claim for Jardiance 10 mg and Requires Prior Authorization  This test claim was processed through [employer]- copay amounts may vary at other pharmacies due to [employer], or as the patient moves through the different stages of their insurance plan.     Roland Earl, CPHT Pharmacy Technician III Certified Patient Advocate [facility] Patient Advocate Team Direct Number: [phone]  Fax: [phone]

## 2023-10-15 ENCOUNTER — Other Ambulatory Visit: Payer: Self-pay

## 2023-10-15 DIAGNOSIS — I480 Paroxysmal atrial fibrillation: Secondary | ICD-10-CM | POA: Diagnosis not present

## 2023-10-15 DIAGNOSIS — R9431 Abnormal electrocardiogram [ECG] [EKG]: Secondary | ICD-10-CM | POA: Diagnosis not present

## 2023-10-15 DIAGNOSIS — I5032 Chronic diastolic (congestive) heart failure: Secondary | ICD-10-CM | POA: Diagnosis not present

## 2023-10-15 DIAGNOSIS — I160 Hypertensive urgency: Secondary | ICD-10-CM | POA: Diagnosis not present

## 2023-10-15 DIAGNOSIS — J4520 Mild intermittent asthma, uncomplicated: Secondary | ICD-10-CM | POA: Diagnosis not present

## 2023-10-15 LAB — GLUCOSE, CAPILLARY
Glucose-Capillary: 184 mg/dL — ABNORMAL HIGH (ref 70–99)
Glucose-Capillary: 108 mg/dL — ABNORMAL HIGH (ref 70–99)
Glucose-Capillary: 116 mg/dL — ABNORMAL HIGH (ref 70–99)
Glucose-Capillary: 102 mg/dL — ABNORMAL HIGH (ref 70–99)

## 2023-10-15 LAB — RENAL FUNCTION PANEL
Albumin: 3.8 g/dL (ref 3.5–5.0)
Anion gap: 13 (ref 5–15)
BUN: 27 mg/dL — ABNORMAL HIGH (ref 6–20)
CO2: 24 mmol/L (ref 22–32)
Calcium: 9.2 mg/dL (ref 8.9–10.3)
Chloride: 103 mmol/L (ref 98–111)
Creatinine, Ser: 1.88 mg/dL — ABNORMAL HIGH (ref 0.61–1.24)
GFR, Estimated: 44 mL/min — ABNORMAL LOW (ref >60)
Glucose, Bld: 107 mg/dL — ABNORMAL HIGH (ref 70–99)
Phosphorus: 2.3 mg/dL — ABNORMAL LOW (ref 2.5–4.6)
Potassium: 4.1 mmol/L (ref 3.5–5.1)
Sodium: 140 mmol/L (ref 135–145)

## 2023-10-15 MED ORDER — AMLODIPINE BESYLATE 10 MG PO TABS
10.0000 mg | ORAL_TABLET | Freq: Every day | ORAL | Status: DC
  Administered 2023-10-15 – 2023-10-16 (×2): 10 mg via ORAL
  Filled 2023-10-15 (×2): qty 1

## 2023-10-15 MED ORDER — IRBESARTAN 150 MG PO TABS
75.0000 mg | ORAL_TABLET | Freq: Every day | ORAL | Status: DC
  Administered 2023-10-15 – 2023-10-16 (×2): 75 mg via ORAL
  Filled 2023-10-15 (×2): qty 1

## 2023-10-15 NOTE — Plan of Care (Signed)
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
## 2023-10-15 NOTE — Progress Notes (Signed)
[name]  PCP: Pcp, No PCP-Cardiologist: Yvonne Kendall, MD  HPI: Brad [name]. is a 48 y.o. male with obesity, polysubstance abuse, HFpEF, stage III CKD, hypertension, atrial fibrillation, T2DM who presented with chest pain, shortness of breath with minimal exertion, swelling, and headache. He has been unhoused since his mother passed. He has been without medications for several days. On admission, BNP was 265.1, HS-troponin was 50 > 53, and UDS positive for cocaine + cannabinoid. Chest x-ray noted no acute cardiopulmonary disease.   Pertinent cardiac history: Echo in 03/2016 showed LVEF 55-65%. Echo in 12/2020 with normal LVEF and grade I diastolic dysfunction. Echo in 02/2022 showed normal LVEF with severe LVH, grade III diastolic dysfunction. Stress test 02/2022 noted no significant ischemia.  Pertinent Lab Values: Creatinine, Ser  Date Value Ref Range Status  10/14/2023 1.97 (H) 0.61 - 1.24 mg/dL Final   BUN  Date Value Ref Range Status  10/14/2023 29 (H) 6 - 20 mg/dL Final  30/86/5784 43 (H) 6 - 24 mg/dL Final   Potassium  Date Value Ref Range Status  10/14/2023 3.8 3.5 - 5.1 mmol/L Final   Sodium  Date Value Ref Range Status  10/14/2023 140 135 - 145 mmol/L Final  07/16/2023 143 134 - 144 mmol/L Final   B Natriuretic Peptide  Date Value Ref Range Status  10/13/2023 265.1 (H) 0.0 - 100.0 pg/mL Final    Comment:    Performed at [facility], [address] [place], [place] [address]   Magnesium  Date Value Ref Range Status  03/06/2022 2.2 1.7 - 2.4 mg/dL Final    Comment:    Performed at [facility], [address] [place], [place] [address]   HbA1c, POC (controlled diabetic range)  Date Value Ref Range Status  01/31/2022 6.3 0.0 - 7.0 % Final   Hgb A1c MFr Bld  Date Value Ref Range Status  06/10/2023 6.4 (H) 4.8 - 5.6 % Final    Comment:             Prediabetes: 5.7 - 6.4          Diabetes: >6.4           Glycemic control for adults with diabetes: <7.0    TSH  Date Value Ref Range Status  03/04/2022 2.710 0.350 - 4.500 uIU/mL Final    Comment:    Performed by a 3rd Generation assay with a functional sensitivity of <=0.01 uIU/mL. Performed at [facility], [address] [place], [place] [address]     Vital Signs: Temp:  [97.7 F (36.5 C)-98.2 F (36.8 C)] 98.1 F (36.7 C) (02/20 0733) Pulse Rate:  [86-102] 89 (02/20 0733) Cardiac Rhythm: Normal sinus rhythm (02/20 0809) Resp:  [17-22] 22 (02/20 0733) BP: (132-194)/(57-116) 194/116 (02/20 0733) SpO2:  [94 %-97 %] 97 % (02/20 0733) Weight:  [125.6 kg (276 lb 14.4 oz)] 125.6 kg (276 lb 14.4 oz) (02/20 0504)  Intake/Output Summary (Last 24 hours) at 10/15/2023 1027 Last data filed at 10/14/2023 1100 Gross per 24 hour  Intake 120 ml  Output --  Net 120 ml    Current Heart Failure Medications:  Loop diuretic: furosemide 40 mg PO daily Beta-Blocker: carvedilol 6.25 mg BID ACEI/ARB/ARNI: none MRA: spironolactone 12.5 mg daily SGLT2i: Jardiance 10 mg daily  Prior to admission Heart Failure Medications:  Loop diuretic: furosemide 40 mg daily Beta-Blocker: metoprolol succinate 200 mg daily ACEI/ARB/ARNI: valsartan 160 mg daily MRA: spironolactone 12.5 mg daily  SGLT2i: none Other vasoactive medications include amlodipine 10 mg daily  Assessment: 1. Acute on chronic diastolic heart failure (LVEF 60-65%) with grade III diastolic dysfunction, due to NICM. NYHA class II-III symptoms.  -Symptoms: Reports at baseline now after restarting medications. Denies chest pain at rest. Denies shortness of breath and orthopnea. Denies LEE. Does report isolated left hand swelling. -Volume: Appears euvolemic. -Hemodynamics: BP remains elevated. Heart rate is 80s. -SGLT2i: Continue Jardiance 10 mg daily. Prior authorization completed and copay is $4. -MRA: Currently on spironolactone 12.5 mg daily given renal dysfunction. Continue  at current dose for now. Can consider increasing to 25 mg if creatinine is improving. -ARNI: Would benefit from [facility], however patient reports he will not be likely to take any medications twice daily. Irbesartan started today given improving creatinine and uncontrolled hypertension. -Carvedilol was started for uncontrolled hypertension, however the patient reports he will struggle to take it as prescribed. HE was on metoprolol prior to admission. There is also data to suggest that metoprolol use is not harmful for patient with CHF using cocaine, however will defer to MD. -Though no significant benefit to HFpEF, currently requiring amlodipine to control BP.   Plan: 1) Medication changes recommended at this time: -Discussed with MD and irbesartan is being resumed today at a lower equivalent dose of valsartan than at home. Can consider increasing tomorrow if creatinine remains stable.  2) Patient assistance: -Copay for all medications is $4. Can waive at [facility] pharmacy.  3) Education: - Patient has been educated on current HF medications and potential additions to HF medication regimen - Patient verbalizes understanding that over the next few months, these medication doses may change and more medications may be added to optimize HF regimen - Patient has been educated on basic disease state pathophysiology and goals of therapy  Medication Assistance / Insurance Benefits Check: Does the patient have prescription insurance?    Type of insurance plan:  Does the patient qualify for medication assistance through manufacturers or grants? No  Outpatient Pharmacy: Prior to admission outpatient pharmacy: [facility] Is the patient agreeable to switch to [facility]patient Pharmacy?: Yes Is the patient willing to utilize a [facility] pharmacy at discharge?: Yes  Please do not hesitate to reach out with questions or concerns,  Enos Fling, PharmD, CPP, BCPS Heart Failure Pharmacist  Phone -  [phone] 10/15/2023 8:22 AM

## 2023-10-15 NOTE — Plan of Care (Signed)
  Problem: Education: Goal: Knowledge of General Education information will improve Description: Including pain rating scale, medication(s)/side effects and non-pharmacologic comfort measures Outcome: Progressing   Problem: Clinical Measurements: Goal: Ability to maintain clinical measurements within normal limits will improve Outcome: Adequate for Discharge   Problem: Activity: Goal: Risk for activity intolerance will decrease Outcome: Adequate for Discharge   Problem: Nutrition: Goal: Adequate nutrition will be maintained Outcome: Adequate for Discharge

## 2023-10-16 ENCOUNTER — Other Ambulatory Visit: Payer: Self-pay

## 2023-10-16 DIAGNOSIS — F1410 Cocaine abuse, uncomplicated: Secondary | ICD-10-CM | POA: Diagnosis not present

## 2023-10-16 DIAGNOSIS — N1832 Chronic kidney disease, stage 3b: Secondary | ICD-10-CM

## 2023-10-16 DIAGNOSIS — F1910 Other psychoactive substance abuse, uncomplicated: Secondary | ICD-10-CM | POA: Diagnosis not present

## 2023-10-16 DIAGNOSIS — I5032 Chronic diastolic (congestive) heart failure: Secondary | ICD-10-CM | POA: Diagnosis not present

## 2023-10-16 DIAGNOSIS — E1169 Type 2 diabetes mellitus with other specified complication: Secondary | ICD-10-CM | POA: Diagnosis not present

## 2023-10-16 DIAGNOSIS — I480 Paroxysmal atrial fibrillation: Secondary | ICD-10-CM | POA: Diagnosis not present

## 2023-10-16 DIAGNOSIS — I160 Hypertensive urgency: Secondary | ICD-10-CM | POA: Diagnosis not present

## 2023-10-16 LAB — GLUCOSE, CAPILLARY: Glucose-Capillary: 124 mg/dL — ABNORMAL HIGH (ref 70–99)

## 2023-10-16 LAB — RENAL FUNCTION PANEL
Albumin: 3.5 g/dL (ref 3.5–5.0)
Anion gap: 10 (ref 5–15)
BUN: 27 mg/dL — ABNORMAL HIGH (ref 6–20)
CO2: 24 mmol/L (ref 22–32)
Calcium: 8.7 mg/dL — ABNORMAL LOW (ref 8.9–10.3)
Chloride: 103 mmol/L (ref 98–111)
Creatinine, Ser: 1.97 mg/dL — ABNORMAL HIGH (ref 0.61–1.24)
GFR, Estimated: 41 mL/min — ABNORMAL LOW (ref >60)
Glucose, Bld: 169 mg/dL — ABNORMAL HIGH (ref 70–99)
Phosphorus: 3.2 mg/dL (ref 2.5–4.6)
Potassium: 4.4 mmol/L (ref 3.5–5.1)
Sodium: 137 mmol/L (ref 135–145)

## 2023-10-16 MED ORDER — EMPAGLIFLOZIN 10 MG PO TABS
10.0000 mg | ORAL_TABLET | Freq: Every day | ORAL | 2 refills | Status: AC
  Filled 2023-10-16: qty 30, 30d supply, fill #0
  Filled 2023-10-16: qty 90, 90d supply, fill #1
  Filled 2023-11-16: qty 30, 30d supply, fill #1

## 2023-10-16 MED ORDER — METOPROLOL SUCCINATE ER 100 MG PO TB24
200.0000 mg | ORAL_TABLET | Freq: Every day | ORAL | 3 refills | Status: AC
  Filled 2023-10-16: qty 157, 78d supply, fill #1
  Filled 2023-10-16: qty 70, 35d supply, fill #0
  Filled 2023-10-16: qty 110, 55d supply, fill #0
  Filled 2023-10-16: qty 23, 12d supply, fill #1

## 2023-10-16 MED ORDER — IRBESARTAN 150 MG PO TABS
75.0000 mg | ORAL_TABLET | Freq: Every day | ORAL | 2 refills | Status: DC
  Filled 2023-10-16: qty 45, 90d supply, fill #0

## 2023-10-16 MED ORDER — RIVAROXABAN 15 MG PO TABS
15.0000 mg | ORAL_TABLET | Freq: Every day | ORAL | 3 refills | Status: AC
  Filled 2023-10-16: qty 30, 30d supply, fill #0
  Filled 2023-10-16: qty 60, 60d supply, fill #0
  Filled 2023-11-16: qty 90, 90d supply, fill #1

## 2023-10-16 MED ORDER — ATORVASTATIN CALCIUM 80 MG PO TABS
80.0000 mg | ORAL_TABLET | Freq: Every day | ORAL | 3 refills | Status: AC
  Filled 2023-10-16: qty 90, 90d supply, fill #0

## 2023-10-16 MED ORDER — AMLODIPINE BESYLATE 10 MG PO TABS
10.0000 mg | ORAL_TABLET | Freq: Every day | ORAL | 3 refills | Status: AC
  Filled 2023-10-16: qty 90, 90d supply, fill #0

## 2023-10-16 MED ORDER — ISOSORBIDE MONONITRATE ER 30 MG PO TB24
30.0000 mg | ORAL_TABLET | Freq: Every day | ORAL | 3 refills | Status: AC
  Filled 2023-10-16: qty 90, 90d supply, fill #0

## 2023-10-16 MED ORDER — FUROSEMIDE 40 MG PO TABS
40.0000 mg | ORAL_TABLET | Freq: Every day | ORAL | 2 refills | Status: AC
  Filled 2023-10-16: qty 90, 90d supply, fill #0

## 2023-10-16 MED ORDER — ASPIRIN 81 MG PO TBEC
81.0000 mg | DELAYED_RELEASE_TABLET | Freq: Every day | ORAL | 11 refills | Status: AC
  Filled 2023-10-16: qty 30, 30d supply, fill #0

## 2023-10-16 MED ORDER — SPIRONOLACTONE 25 MG PO TABS
12.5000 mg | ORAL_TABLET | Freq: Every day | ORAL | 2 refills | Status: AC
  Filled 2023-10-16: qty 45, 90d supply, fill #0

## 2023-10-16 NOTE — Progress Notes (Signed)
Patient called out complaining of feeling uneasy, unable to describe symptoms. Just repeated that his heart was not right. Patient was lying in bed not engaged in any activity. Heart rate was elevated to the 120's. EKG was performed. Heart rate returned to normal and patient denied any further symptoms.

## 2023-10-16 NOTE — TOC Transition Note (Signed)
Transition of Care [facility]) - Discharge Note   Patient Details  Name: Brad [name]. MRN: [id number] Date of Birth: 02-Feb-1976  Transition of Care [facility]) CM/SW Contact:  Truddie Hidden, RN Phone Number: 10/16/2023, 12:01 PM   Clinical Narrative:    Spoke with patient per his request for living situation, transportation and disability. Patient stated he would like to get his disability started. He was provided with contact number to call and website. Patient was previously provided with transportation, shelter and housing information.   TOC signing off.            Patient Goals and CMS Choice            Discharge Placement                       Discharge Plan and Services Additional resources added to the After Visit Summary for                                       Social Drivers of Health (SDOH) Interventions SDOH Screenings   Food Insecurity: Food Insecurity Present (10/13/2023)  Housing: High Risk (10/13/2023)  Transportation Needs: Unmet Transportation Needs (10/13/2023)  Utilities: Not At Risk (10/13/2023)  Alcohol Screen: Low Risk  (06/10/2023)  Depression (PHQ2-9): Medium Risk (06/10/2023)  Financial Resource Strain: High Risk (10/13/2023)  Physical Activity: Insufficiently Active (06/10/2023)  Social Connections: Socially Isolated (10/13/2023)  Stress: Stress Concern Present (06/10/2023)  Tobacco Use: High Risk (10/13/2023)  Health Literacy: Adequate Health Literacy (10/13/2023)     Readmission Risk Interventions    04/12/2021    3:10 PM  Readmission Risk Prevention Plan  Transportation Screening Complete  PCP or Specialist Appt within 3-5 Days Complete  HRI or Home Care Consult Complete  Social Work Consult for Recovery Care Planning/Counseling Complete  Palliative Care Screening Complete  Medication Review Oceanographer) Complete

## 2023-10-16 NOTE — Plan of Care (Signed)
  Problem: Clinical Measurements: Goal: Respiratory complications will improve Outcome: Progressing Goal: Cardiovascular complication will be avoided Outcome: Not Progressing   Problem: Activity: Goal: Risk for activity intolerance will decrease Outcome: Progressing

## 2023-10-16 NOTE — Progress Notes (Signed)
[name]  PCP: Pcp, No PCP-Cardiologist: Yvonne Kendall, MD  HPI: Brad [name]. is a 48 y.o. male with obesity, polysubstance abuse, HFpEF, stage III CKD, hypertension, atrial fibrillation, T2DM who presented with chest pain, shortness of breath with minimal exertion, swelling, and headache. He has been unhoused since his mother passed. He has been without medications for several days. On admission, BNP was 265.1, HS-troponin was 50 > 53, and UDS positive for cocaine + cannabinoid. Chest x-ray noted no acute cardiopulmonary disease.   Pertinent cardiac history: Echo in 03/2016 showed LVEF 55-65%. Echo in 12/2020 with normal LVEF and grade I diastolic dysfunction. Echo in 02/2022 showed normal LVEF with severe LVH, grade III diastolic dysfunction. Stress test 02/2022 noted no significant ischemia.  Pertinent Lab Values: Creatinine, Ser  Date Value Ref Range Status  10/16/2023 1.97 (H) 0.61 - 1.24 mg/dL Final   BUN  Date Value Ref Range Status  10/16/2023 27 (H) 6 - 20 mg/dL Final  96/29/5284 43 (H) 6 - 24 mg/dL Final   Potassium  Date Value Ref Range Status  10/16/2023 4.4 3.5 - 5.1 mmol/L Final   Sodium  Date Value Ref Range Status  10/16/2023 137 135 - 145 mmol/L Final  07/16/2023 143 134 - 144 mmol/L Final   B Natriuretic Peptide  Date Value Ref Range Status  10/13/2023 265.1 (H) 0.0 - 100.0 pg/mL Final    Comment:    Performed at [facility], [address] [place], [place] [address]   Magnesium  Date Value Ref Range Status  03/06/2022 2.2 1.7 - 2.4 mg/dL Final    Comment:    Performed at [facility], [address] [place], [place] [address]   HbA1c, POC (controlled diabetic range)  Date Value Ref Range Status  01/31/2022 6.3 0.0 - 7.0 % Final   Hgb A1c MFr Bld  Date Value Ref Range Status  06/10/2023 6.4 (H) 4.8 - 5.6 % Final    Comment:             Prediabetes: 5.7 - 6.4          Diabetes: >6.4           Glycemic control for adults with diabetes: <7.0    TSH  Date Value Ref Range Status  03/04/2022 2.710 0.350 - 4.500 uIU/mL Final    Comment:    Performed by a 3rd Generation assay with a functional sensitivity of <=0.01 uIU/mL. Performed at [facility], [address] [place], [place] [address]     Vital Signs: Temp:  [98.2 F (36.8 C)-98.5 F (36.9 C)] 98.2 F (36.8 C) (02/21 0737) Pulse Rate:  [78-105] 91 (02/21 0737) Resp:  [16-20] 16 (02/21 0737) BP: (128-167)/(73-105) 137/79 (02/21 0737) SpO2:  [92 %-98 %] 98 % (02/21 0737) Weight:  [124.9 kg (275 lb 5.7 oz)] 124.9 kg (275 lb 5.7 oz) (02/21 0433)  Intake/Output Summary (Last 24 hours) at 10/16/2023 1008 Last data filed at 10/16/2023 0500 Gross per 24 hour  Intake 720 ml  Output --  Net 720 ml    Current Heart Failure Medications:  Loop diuretic: furosemide 40 mg PO daily Beta-Blocker: none ACEI/ARB/ARNI: irbesartan 75 mg daily MRA: spironolactone 12.5 mg daily SGLT2i: Jardiance 10 mg daily  Prior to admission Heart Failure Medications:  Loop diuretic: furosemide 40 mg daily Beta-Blocker: metoprolol succinate 200 mg daily ACEI/ARB/ARNI: valsartan 160 mg daily MRA: spironolactone 12.5 mg daily SGLT2i: none Other vasoactive medications include amlodipine  10 mg daily  Assessment: 1. Acute on chronic diastolic heart failure (LVEF 60-65%) with grade III diastolic dysfunction, due to NICM. NYHA class II-III symptoms.  -Symptoms: Reports at baseline now after restarting medications. Denies chest pain at rest. Denies shortness of breath and orthopnea. Denies LEE. Left hand swelling resolved. -Volume: Appears euvolemic.Weight trending down. -Hemodynamics: BP is much better controlled today. Heart rate is 80s. -SGLT2i: Continue Jardiance 10 mg daily. Prior authorization completed and copay is $4. -MRA: Currently on spironolactone 12.5 mg daily given renal dysfunction. Continue at current dose for now.    -ARNI: Would benefit from [facility], however patient reports he will not be likely to take any medications twice daily. Irbesartan started today given improving creatinine and uncontrolled hypertension. Creatinine is stable after restarting. Can increase outpatient given BP is continuing to improve. -Carvedilol was started for uncontrolled hypertension, however the patient reports he will struggle to take it as prescribed. HE was on metoprolol prior to admission. There is also data to suggest that metoprolol use is not harmful for patient with CHF using cocaine, however will defer to MD. -Though no significant benefit to HFpEF, currently requiring amlodipine to control BP.   Plan: 1) Medication changes recommended at this time: -None. Patient would like meds to beds.  2) Patient assistance: -Copay for all medications is $4. Can waive at [facility] pharmacy.  3) Education: - Patient has been educated on current HF medications and potential additions to HF medication regimen - Patient verbalizes understanding that over the next few months, these medication doses may change and more medications may be added to optimize HF regimen - Patient has been educated on basic disease state pathophysiology and goals of therapy  Medication Assistance / Insurance Benefits Check: Does the patient have prescription insurance?    Type of insurance plan:  Does the patient qualify for medication assistance through manufacturers or grants? No  Outpatient Pharmacy: Prior to admission outpatient pharmacy: [facility] Is the patient agreeable to switch to [facility] Outpatient Pharmacy?: Yes Is the patient willing to utilize a [facility] pharmacy at discharge?: Yes  Please do not hesitate to reach out with questions or concerns,  Enos Fling, PharmD, CPP, BCPS Heart Failure Pharmacist  Phone - [phone] 10/16/2023 10:08 AM
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
## 2023-10-19 ENCOUNTER — Encounter: Payer: Medicaid Other | Admitting: Family

## 2023-10-20 ENCOUNTER — Telehealth: Payer: Self-pay | Admitting: Family

## 2023-10-20 ENCOUNTER — Telehealth (HOSPITAL_COMMUNITY): Payer: Self-pay | Admitting: Licensed Clinical Social Worker

## 2023-10-20 NOTE — Telephone Encounter (Signed)
 Lvm to confirm appt for 10/21/23

## 2023-10-20 NOTE — Progress Notes (Deleted)
 Advanced Heart Failure Clinic Note   PCP: Eulogio Bear, NP @ [facility] (last seen 11/24) Primary Cardiologist: End, Cristal Deer, MD (last seen 01/24)  Chief Complaint:  HPI:  Brad [name] is a 48 y/o male with a history of HTN, tobacco use, substance abuse, rapid atrial fibrillation and chronic heart failure.    Has not been admitted or been in the ED in the last 6 months.   Echo 04/19/16: EF 55-65% with mild LVH Echo 01/13/21: EF 60-65% along with severe LVH.  Echo 03/04/22: EF 60-65% along with severe LVH.  He presents today with a chief complaint of minimal shortness of breath with moderate exertion. Has no other symptoms and specifically denies fatigue, chest pain, cough, palpitations, abdominal distention, pedal edema, dizziness or difficulty sleeping. Does report being under a lot of stress as his mom died two weeks ago. He did use cocaine 2 days ago to deal with the stress. His mom lived with him and he's somewhat estranged from his brother.   At last visit, spironolactone was decreased to 12.5mg  daily. At recent PCP appointment, sleep study and nephrology referrals were placed.   ROS: All systems negative except as listed in HPI, PMH and Problem List.  SH:  Social History   Socioeconomic History   Marital status: Single    Spouse name: Not on file   Number of children: 1   Years of education: Not on file   Highest education level: 7th grade  Occupational History   Not on file  Tobacco Use   Smoking status: Some Days    Current packs/day: 1.00    Types: Cigarettes   Smokeless tobacco: Never   Tobacco comments:    2-3 cigarettes   Vaping Use   Vaping status: Never Used  Substance and Sexual Activity   Alcohol use: Not Currently    Comment: occas   Drug use: Yes    Types: Marijuana, Cocaine    Comment: marijuana daily, cocaine only on really painful days   Sexual activity: Yes  Other Topics Concern   Not on file  Social History Narrative   Not on  file   Social Drivers of Health   Financial Resource Strain: High Risk (10/13/2023)   Overall Financial Resource Strain (CARDIA)    Difficulty of Paying Living Expenses: Very hard  Food Insecurity: Food Insecurity Present (10/13/2023)   Hunger Vital Sign    Worried About Running Out of Food in the Last Year: Often true    Ran Out of Food in the Last Year: Often true  Transportation Needs: Unmet Transportation Needs (10/13/2023)   PRAPARE - Administrator, Civil Service (Medical): Yes    Lack of Transportation (Non-Medical): Yes  Physical Activity: Insufficiently Active (06/10/2023)   Exercise Vital Sign    Days of Exercise per Week: 3 days    Minutes of Exercise per Session: 30 min  Stress: Stress Concern Present (06/10/2023)   [employer] of Occupational Health - Occupational Stress Questionnaire    Feeling of Stress : To some extent  Social Connections: Socially Isolated (10/13/2023)   Social Connection and Isolation Panel [NHANES]    Frequency of Communication with Friends and Family: More than three times a week    Frequency of Social Gatherings with Friends and Family: Never    Attends Religious Services: Never    Database administrator or Organizations: No    Attends Banker Meetings: Never    Marital Status: Never married  Intimate Partner Violence: Not At Risk (10/13/2023)   Humiliation, Afraid, Rape, and Kick questionnaire    Fear of Current or Ex-Partner: No    Emotionally Abused: No    Physically Abused: No    Sexually Abused: No    FH:  Family History  Problem Relation Age of Onset   Hypertension Mother     Past Medical History:  Diagnosis Date   Arrhythmia    atrial fibrillation   Asthma    CHF (congestive heart failure) (HCC)    CKD (chronic kidney disease), stage III (HCC)    Cocaine abuse (HCC)    Diverticulitis    Hypertension    Tobacco use disorder     Current Outpatient Medications  Medication Sig Dispense Refill    amLODipine (NORVASC) 10 MG tablet Take 1 tablet (10 mg total) by mouth daily. 90 tablet 3   aspirin EC 81 MG tablet Take 1 tablet (81 mg total) by mouth daily. Swallow whole. 30 tablet 11   atorvastatin (LIPITOR) 80 MG tablet Take 1 tablet (80 mg total) by mouth daily. 90 tablet 3   empagliflozin (JARDIANCE) 10 MG TABS tablet Take 1 tablet (10 mg total) by mouth daily. 90 tablet 2   furosemide (LASIX) 40 MG tablet Take 1 tablet (40 mg total) by mouth daily. 90 tablet 2   irbesartan (AVAPRO) 150 MG tablet Take 0.5 tablets (75 mg total) by mouth daily. 45 tablet 2   isosorbide mononitrate (IMDUR) 30 MG 24 hr tablet Take 1 tablet (30 mg total) by mouth once daily. 90 tablet 3   metoprolol succinate (TOPROL-XL) 100 MG 24 hr tablet Take 2 tablets (200 mg total) by mouth daily. 180 tablet 3   Rivaroxaban (XARELTO) 15 MG TABS tablet Take 1 tablet (15 mg total) by mouth daily. 90 tablet 3   spironolactone (ALDACTONE) 25 MG tablet Take 0.5 tablets (12.5 mg total) by mouth daily. 90 tablet 2   No current facility-administered medications for this visit.   There were no vitals filed for this visit.  Wt Readings from Last 3 Encounters:  10/16/23 275 lb 5.7 oz (124.9 kg)  07/30/23 284 lb (128.8 kg)  07/16/23 280 lb 3.2 oz (127.1 kg)   Lab Results  Component Value Date   CREATININE 1.97 (H) 10/16/2023   CREATININE 1.88 (H) 10/15/2023   CREATININE 1.97 (H) 10/14/2023    PHYSICAL EXAM:  General:  Well appearing. No resp difficulty HEENT: normal Neck: supple. JVP flat. No lymphadenopathy or thryomegaly appreciated. Cor: PMI normal. Regular rate & rhythm. No rubs, gallops or murmurs. Lungs: clear Abdomen: soft, nontender, nondistended. No hepatosplenomegaly. No bruits or masses.  Extremities: no cyanosis, clubbing, rash, edema Neuro: alert & oriented x3, cranial nerves grossly intact. Moves all 4 extremities w/o difficulty. Affect pleasant.   ECG: not done   ASSESSMENT & PLAN:  1: NICM  with preserved ejection fraction- - likely HTN/ AF/ cocaine use - NYHA class II - euvolemic today - weighing daily; reminded to call for an overnight weight gain of > 2 pounds or a weekly weight gain of > 5 pounds - weight up 3 pounds from last visit here 6 weeks ago - Echo 04/19/16: EF 55-65% with mild LVH - Echo 01/13/21: EF of 60-65% along with severe LVH.  - echo 03/04/22: EF of 60-65% along with severe LVH. - would like to get updated echo if he's able to get insurance - does not add salt to his food; reviewed the importance of reading  food labels for sodium content - continue farxiga 10mg  daily - continue furosemide 40mg  daily - continue isosorbide MN 30mg  daily - continue metoprolol 200mg  daily - continue spironolactone 12.5mg  daily; may need to stop this if renal function continues to worsen - continue valsartan 160mg  daily - BNP 03/04/22 was 713.0  2: HTN with CKD- - BP 152/88 - continue amlodipine 10mg  daily - continue metoprolol, spironolactone, isosorbide and valsartan - would like to add hydralazine for his BP but he says that taking meds >once a day doesn't work for him - consider renal ultrasound but he currently has no insurance coverage - saw PCP @ [facility] 11/24 - BMP 07/16/23 reviewed and showed sodium 143, potassium 4.2, creatinine 2.9 and GFR 26 - PCP recently placed nephrology referral  3: Tobacco use- - smoking marijuana - complete cessation discussed for 3 minutes with him  4: Cocaine abuse- - says that he occasionally uses cocaine when his back pain is so severe that he can't stand it - last cocaine use was a couple of days ago to deal with the death of his mom  5: Atrial fibrillation- - saw cardiology (H[facility]ock) 01/24 - continue xarelto 15mg  once daily - continue ASA 81mg  daily  Return in 2 months, sooner if needed.      Delma Freeze, FNP 10/20/23

## 2023-10-20 NOTE — Telephone Encounter (Signed)
 CSW received referral to assist patient with transportation to [facility] HF appointment tomorrow. CSW unable to reach patient to confirm address for pick up. Message left for return call. Lasandra Beech, LCSW, CCSW-MCS [phone]

## 2023-10-21 ENCOUNTER — Telehealth: Payer: Self-pay | Admitting: Family

## 2023-10-21 ENCOUNTER — Ambulatory Visit: Payer: Medicaid Other | Admitting: Family

## 2023-10-21 NOTE — Telephone Encounter (Signed)
 Patient did not show for his Heart Failure Clinic appointment on 10/21/23.

## 2023-10-23 ENCOUNTER — Other Ambulatory Visit: Payer: Self-pay

## 2023-10-26 ENCOUNTER — Telehealth: Payer: Self-pay | Admitting: Family

## 2023-10-26 NOTE — Telephone Encounter (Signed)
 Lvm to r/s appt from 10/01/23

## 2023-10-27 ENCOUNTER — Other Ambulatory Visit: Payer: Self-pay

## 2023-10-28 ENCOUNTER — Other Ambulatory Visit: Payer: Self-pay

## 2023-10-30 ENCOUNTER — Other Ambulatory Visit: Payer: Self-pay

## 2023-11-02 ENCOUNTER — Other Ambulatory Visit: Payer: Self-pay

## 2023-11-03 ENCOUNTER — Emergency Department
Admission: EM | Admit: 2023-11-03 | Discharge: 2023-11-03 | Discharge status: Against Medical Advice | Attending: Emergency Medicine | Admitting: Emergency Medicine

## 2023-11-03 ENCOUNTER — Telehealth: Payer: Self-pay | Admitting: Family

## 2023-11-03 DIAGNOSIS — Z5321 Procedure and treatment not carried out due to patient leaving prior to being seen by health care provider: Secondary | ICD-10-CM | POA: Diagnosis not present

## 2023-11-03 DIAGNOSIS — F1410 Cocaine abuse, uncomplicated: Secondary | ICD-10-CM | POA: Diagnosis not present

## 2023-11-03 NOTE — ED Triage Notes (Signed)
 Patient states he has a drug addiction to cocaine, last used 2 days ago. Requesting detox for 3 days until he can go to a facility.

## 2023-11-03 NOTE — Progress Notes (Unsigned)
 Advanced Heart Failure Clinic Note   PCP: Eulogio Bear, NP @ [facility] (last seen 11/24) Primary Cardiologist: End, Cristal Deer, MD (last seen 01/24)  Chief Complaint:  HPI:  Brad [name] is a 48 y/o male with a history of HTN, tobacco use, substance abuse, rapid atrial fibrillation and chronic heart failure.    Admitted 10/13/23 due to hypertensive urgency after being without medications for ~ 3 days. Unclear if they were lost or stolen as patient was homeless. Recent cocaine use. Blood pressure improved after medications restarted. 2D Echo shows EF 60-65%, grade 1 dd, LA enlarged.   Was in the ED 11/03/23 with desire to detox from cocaine. LWBS  Echo 04/19/16: EF 55-65% with mild LVH Echo 01/13/21: EF 60-65% along with severe LVH.  Echo 03/04/22: EF 60-65% along with severe LVH. Echo 10/14/23: EF 60-65% with severe LVH, G1DD, normal RV, severe LAE, mild Brad  He presents today with a chief complaint of   ROS: All systems negative except as listed in HPI, PMH and Problem List.  SH:  Social History   Socioeconomic History   Marital status: Single    Spouse name: Not on file   Number of children: 1   Years of education: Not on file   Highest education level: 7th grade  Occupational History   Not on file  Tobacco Use   Smoking status: Some Days    Current packs/day: 1.00    Types: Cigarettes   Smokeless tobacco: Never   Tobacco comments:    2-3 cigarettes   Vaping Use   Vaping status: Never Used  Substance and Sexual Activity   Alcohol use: Not Currently    Comment: occas   Drug use: Yes    Types: Marijuana, Cocaine    Comment: marijuana daily, cocaine only on really painful days   Sexual activity: Yes  Other Topics Concern   Not on file  Social History Narrative   Not on file   Social Drivers of Health   Financial Resource Strain: High Risk (10/13/2023)   Overall Financial Resource Strain (CARDIA)    Difficulty of Paying Living Expenses: Very hard   Food Insecurity: Food Insecurity Present (10/13/2023)   Hunger Vital Sign    Worried About Running Out of Food in the Last Year: Often true    Ran Out of Food in the Last Year: Often true  Transportation Needs: Unmet Transportation Needs (10/13/2023)   PRAPARE - Administrator, Civil Service (Medical): Yes    Lack of Transportation (Non-Medical): Yes  Physical Activity: Insufficiently Active (06/10/2023)   Exercise Vital Sign    Days of Exercise per Week: 3 days    Minutes of Exercise per Session: 30 min  Stress: Stress Concern Present (06/10/2023)   [employer] of Occupational Health - Occupational Stress Questionnaire    Feeling of Stress : To some extent  Social Connections: Socially Isolated (10/13/2023)   Social Connection and Isolation Panel [NHANES]    Frequency of Communication with Friends and Family: More than three times a week    Frequency of Social Gatherings with Friends and Family: Never    Attends Religious Services: Never    Database administrator or Organizations: No    Attends Banker Meetings: Never    Marital Status: Never married  Intimate Partner Violence: Not At Risk (10/13/2023)   Humiliation, Afraid, Rape, and Kick questionnaire    Fear of Current or Ex-Partner: No    Emotionally Abused: No  Physically Abused: No    Sexually Abused: No    FH:  Family History  Problem Relation Age of Onset   Hypertension Mother     Past Medical History:  Diagnosis Date   Arrhythmia    atrial fibrillation   Asthma    CHF (congestive heart failure) (HCC)    CKD (chronic kidney disease), stage III (HCC)    Cocaine abuse (HCC)    Diverticulitis    Hypertension    Tobacco use disorder     Current Outpatient Medications  Medication Sig Dispense Refill   amLODipine (NORVASC) 10 MG tablet Take 1 tablet (10 mg total) by mouth daily. 90 tablet 3   aspirin EC 81 MG tablet Take 1 tablet (81 mg total) by mouth daily. Swallow whole. 30  tablet 11   atorvastatin (LIPITOR) 80 MG tablet Take 1 tablet (80 mg total) by mouth daily. 90 tablet 3   empagliflozin (JARDIANCE) 10 MG TABS tablet Take 1 tablet (10 mg total) by mouth daily. 90 tablet 2   furosemide (LASIX) 40 MG tablet Take 1 tablet (40 mg total) by mouth daily. 90 tablet 2   irbesartan (AVAPRO) 150 MG tablet Take 0.5 tablets (75 mg total) by mouth daily. 45 tablet 2   isosorbide mononitrate (IMDUR) 30 MG 24 hr tablet Take 1 tablet (30 mg total) by mouth once daily. 90 tablet 3   metoprolol succinate (TOPROL-XL) 100 MG 24 hr tablet Take 2 tablets (200 mg total) by mouth daily. 180 tablet 3   Rivaroxaban (XARELTO) 15 MG TABS tablet Take 1 tablet (15 mg total) by mouth daily. 90 tablet 3   spironolactone (ALDACTONE) 25 MG tablet Take 0.5 tablets (12.5 mg total) by mouth daily. 90 tablet 2   No current facility-administered medications for this visit.       PHYSICAL EXAM:  General:  Well appearing. No resp difficulty HEENT: normal Neck: supple. JVP flat. No lymphadenopathy or thryomegaly appreciated. Cor: PMI normal. Regular rate & rhythm. No rubs, gallops or murmurs. Lungs: clear Abdomen: soft, nontender, nondistended. No hepatosplenomegaly. No bruits or masses.  Extremities: no cyanosis, clubbing, rash, edema Neuro: alert & oriented x3, cranial nerves grossly intact. Moves all 4 extremities w/o difficulty. Affect pleasant.   ECG: not done   ASSESSMENT & PLAN:  1: NICM with preserved ejection fraction- - likely HTN/ AF/ cocaine use - NYHA class II - euvolemic today - weighing daily; reminded to call for an overnight weight gain of > 2 pounds or a weekly weight gain of > 5 pounds - weight 284 pounds from last visit here 3 months ago - Echo 04/19/16: EF 55-65% with mild LVH - Echo 01/13/21: EF of 60-65% along with severe LVH.  - echo 03/04/22: EF of 60-65% along with severe LVH. - Echo 10/14/23: EF 60-65% with severe LVH, G1DD, normal RV, severe LAE, mild Brad -  does not add salt to his food; reviewed the importance of reading food labels for sodium content - continue farxiga 10mg  daily - continue furosemide 40mg  daily - continue isosorbide MN 30mg  daily - continue metoprolol 200mg  daily - continue spironolactone 12.5mg  daily; may need to stop this if renal function continues to worsen - continue valsartan 160mg  daily - BNP 10/13/23 reviewed and was 265.1  2: HTN with CKD- - BP  - continue amlodipine 10mg  daily - continue metoprolol, spironolactone, isosorbide and valsartan - would like to add hydralazine for his BP but he says that taking meds >once a day doesn't work  for him - consider renal ultrasound but he currently has no insurance coverage - saw PCP @ [facility] 11/24 - BMP 10/16/23 reviewed and showed sodium 137, potassium 4.4, creatinine 1.97 and GFR 41 - PCP recently placed nephrology referral  3: Tobacco use- - smoking marijuana - complete cessation discussed for 3 minutes with him  4: Cocaine abuse- - says that he occasionally uses cocaine when his back pain is so severe that he can't stand it - last cocaine use was a couple of days ago to deal with the death of his mom  5: Atrial fibrillation- - saw cardiology ([facility]) 01/24 - continue xarelto 15mg  once daily - continue ASA 81mg  daily     Delma Freeze, FNP 11/03/23

## 2023-11-04 ENCOUNTER — Ambulatory Visit: Attending: Family | Admitting: Family

## 2023-11-04 ENCOUNTER — Other Ambulatory Visit: Payer: Self-pay

## 2023-11-04 ENCOUNTER — Encounter: Payer: Self-pay | Admitting: Family

## 2023-11-04 VITALS — BP 172/120 | HR 60 | Wt 275.0 lb

## 2023-11-04 DIAGNOSIS — Z7982 Long term (current) use of aspirin: Secondary | ICD-10-CM | POA: Insufficient documentation

## 2023-11-04 DIAGNOSIS — I428 Other cardiomyopathies: Secondary | ICD-10-CM | POA: Insufficient documentation

## 2023-11-04 DIAGNOSIS — Z7901 Long term (current) use of anticoagulants: Secondary | ICD-10-CM | POA: Diagnosis not present

## 2023-11-04 DIAGNOSIS — I480 Paroxysmal atrial fibrillation: Secondary | ICD-10-CM

## 2023-11-04 DIAGNOSIS — Z5971 Insufficient health insurance coverage: Secondary | ICD-10-CM | POA: Insufficient documentation

## 2023-11-04 DIAGNOSIS — Z5941 Food insecurity: Secondary | ICD-10-CM | POA: Diagnosis not present

## 2023-11-04 DIAGNOSIS — I440 Atrioventricular block, first degree: Secondary | ICD-10-CM | POA: Diagnosis not present

## 2023-11-04 DIAGNOSIS — Z7984 Long term (current) use of oral hypoglycemic drugs: Secondary | ICD-10-CM | POA: Insufficient documentation

## 2023-11-04 DIAGNOSIS — Z5982 Transportation insecurity: Secondary | ICD-10-CM | POA: Diagnosis not present

## 2023-11-04 DIAGNOSIS — Z720 Tobacco use: Secondary | ICD-10-CM | POA: Diagnosis not present

## 2023-11-04 DIAGNOSIS — I5032 Chronic diastolic (congestive) heart failure: Secondary | ICD-10-CM

## 2023-11-04 DIAGNOSIS — F1410 Cocaine abuse, uncomplicated: Secondary | ICD-10-CM

## 2023-11-04 DIAGNOSIS — Z79899 Other long term (current) drug therapy: Secondary | ICD-10-CM | POA: Insufficient documentation

## 2023-11-04 DIAGNOSIS — I130 Hypertensive heart and chronic kidney disease with heart failure and stage 1 through stage 4 chronic kidney disease, or unspecified chronic kidney disease: Secondary | ICD-10-CM | POA: Diagnosis not present

## 2023-11-04 DIAGNOSIS — Z5986 Financial insecurity: Secondary | ICD-10-CM | POA: Insufficient documentation

## 2023-11-04 DIAGNOSIS — F17210 Nicotine dependence, cigarettes, uncomplicated: Secondary | ICD-10-CM | POA: Insufficient documentation

## 2023-11-04 DIAGNOSIS — I10 Essential (primary) hypertension: Secondary | ICD-10-CM

## 2023-11-04 DIAGNOSIS — F10230 Alcohol dependence with withdrawal, uncomplicated: Secondary | ICD-10-CM | POA: Diagnosis not present

## 2023-11-04 DIAGNOSIS — I4891 Unspecified atrial fibrillation: Secondary | ICD-10-CM | POA: Diagnosis not present

## 2023-11-04 DIAGNOSIS — I5089 Other heart failure: Secondary | ICD-10-CM | POA: Diagnosis not present

## 2023-11-04 MED ORDER — IRBESARTAN 150 MG PO TABS
150.0000 mg | ORAL_TABLET | Freq: Every day | ORAL | 1 refills | Status: AC
  Filled 2023-11-04: qty 90, 90d supply, fill #0

## 2023-11-04 NOTE — Patient Instructions (Signed)
 Medication Changes:  INCREASE Irbesartan 150mg  (1 tab) daily  Lab Work:  Go DOWN to LOWER LEVEL (LL) to have your blood work completed inside of [employer] office.   We will only call you if the results are abnormal or if the provider would like to make medication changes.   Special Instructions // Education:  Do the following things EVERYDAY: Weigh yourself in the morning before breakfast. Write it down and keep it in a log. Take your medicines as prescribed Eat low salt foods--Limit salt (sodium) to 2000 mg per day.  Stay as active as you can everyday Limit all fluids for the day to less than 2 liters   Follow-Up in: Please follow up with the Advanced Heart Failure Clinic in 1 month.   At the Advanced Heart Failure Clinic, you and your health needs are our priority. We have a designated team specialized in the treatment of Heart Failure. This Care Team includes your primary Heart Failure Specialized Cardiologist (physician), Advanced Practice Providers (APPs- Physician Assistants and Nurse Practitioners), and Pharmacist who all work together to provide you with the care you need, when you need it.   You may see any of the following providers on your designated Care Team at your next follow up:  Dr. Arvilla Meres Dr. Marca Ancona Dr. Dorthula Nettles Dr. Theresia Bough Clarisa Kindred, NP Tonye Becket, NP Robbie Lis, [place] [facility] [place], [place] Brynda Peon, NP [place] Lee, NP Karle Plumber, PharmD   Please be sure to bring in all your medications bottles to every appointment.   Need to Contact us:  If you have any questions or concerns before your next appointment please send [place] a message through [place] or call our office at [phone].    TO LEAVE A MESSAGE FOR THE NURSE SELECT OPTION 2, PLEASE LEAVE A MESSAGE INCLUDING: YOUR NAME DATE OF BIRTH CALL BACK NUMBER REASON FOR CALL**this is important as we prioritize the call backs  YOU WILL RECEIVE A CALL  BACK THE SAME DAY AS LONG AS YOU CALL BEFORE 4:00 PM

## 2023-11-05 ENCOUNTER — Ambulatory Visit: Admitting: Gerontology

## 2023-11-05 LAB — BASIC METABOLIC PANEL
BUN/Creatinine Ratio: 13 (ref 9–20)
BUN: 26 mg/dL — ABNORMAL HIGH (ref 6–24)
CO2: 21 mmol/L (ref 20–29)
Calcium: 9.4 mg/dL (ref 8.7–10.2)
Chloride: 106 mmol/L (ref 96–106)
Creatinine, Ser: 1.99 mg/dL — ABNORMAL HIGH (ref 0.76–1.27)
Glucose: 164 mg/dL — ABNORMAL HIGH (ref 70–99)
Potassium: 4.2 mmol/L (ref 3.5–5.2)
Sodium: 143 mmol/L (ref 134–144)
eGFR: 41 mL/min/{1.73_m2} — ABNORMAL LOW (ref >59)

## 2023-11-11 ENCOUNTER — Ambulatory Visit: Attending: Internal Medicine | Admitting: Internal Medicine

## 2023-11-11 NOTE — Progress Notes (Deleted)
  Cardiology Office Note:  .   Date:  11/11/2023  ID:  Brad Lynch., DOB 02/02/1976, MRN [id number] PCP: Aviva Kluver  [facility] Providers Cardiologist:  Yvonne Kendall, MD { Click to update primary MD,subspecialty MD or APP then REFRESH:1}    History of Present Illness: .   Brad Boehlke. is a 48 y.o. male with history of chronic HFpEF, paroxysmal atrial fibrillation, hypertension, asthma, housing insecurity, and polysubstance abuse, who presents for follow-up of HFpEF and atrial fibrillation.  He was last seen in our office in 08/2022 by Lavonna Rua hammock, NP, which time he was doing well.  He has subsequently followed up with Clarisa Kindred, NP, in the heart failure clinic, having last been seen 5 days ago.  No medication changes or additional testing were pursued at that time.  He was encouraged to stop smoking and using drugs.  ROS: See HPI  Studies Reviewed: .        *** Risk Assessment/Calculations:   {Does this patient have ATRIAL FIBRILLATION?:[phone]} No BP recorded.  {Refresh Note OR Click here to enter BP  :1}***       Physical Exam:   VS:  There were no vitals taken for this visit.   Wt Readings from Last 3 Encounters:  11/04/23 275 lb (124.7 kg)  10/16/23 275 lb 5.7 oz (124.9 kg)  07/30/23 284 lb (128.8 kg)    General:  NAD. Neck: No JVD or HJR. Lungs: Clear to auscultation bilaterally without wheezes or crackles. Heart: Regular rate and rhythm without murmurs, rubs, or gallops. Abdomen: Soft, nontender, nondistended. Extremities: No lower extremity edema.  ASSESSMENT AND PLAN: .    ***    {Are you ordering a CV Procedure (e.g. stress test, cath, DCCV, TEE, etc)?   Press F2        :[id number]}  Dispo: ***  Signed, Yvonne Kendall, MD

## 2023-11-16 ENCOUNTER — Other Ambulatory Visit: Payer: Self-pay

## 2023-11-19 ENCOUNTER — Other Ambulatory Visit: Payer: Self-pay

## 2023-11-19 NOTE — Progress Notes (Deleted)
 New prescription for jardiance sent to [facility] per faxed request.

## 2023-11-20 ENCOUNTER — Other Ambulatory Visit: Payer: Self-pay

## 2023-12-04 ENCOUNTER — Telehealth: Payer: Self-pay | Admitting: Family

## 2023-12-04 NOTE — Telephone Encounter (Incomplete)
 Called to confirm/remind patient of their appointment at the Advanced Heart Failure Clinic on 12/07/23***.   Appointment:   [] Confirmed  [] Left mess   [x] No answer/No voice mail  [] Phone not in service  Patient reminded to bring all medications and/or complete list.  Confirmed patient has transportation. Gave directions, instructed to utilize valet parking.
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
## 2023-12-07 ENCOUNTER — Encounter: Admitting: Family

## 2023-12-07 ENCOUNTER — Telehealth: Payer: Self-pay | Admitting: Family

## 2023-12-07 NOTE — Telephone Encounter (Signed)
 Patient did not show for his Heart Failure Clinic appointment on 12/07/23.

## 2023-12-30 ENCOUNTER — Other Ambulatory Visit: Payer: Self-pay

## 2023-12-30 NOTE — Congregational Nurse Program (Signed)
  Dept: [phone]   Congregational Nurse Program Note  Date of Encounter: 12/29/2023 Client to [facility] day center, nurse led clinic for blood pressure check. BP elevated BP (!) 160/100 (BP Location: Left Arm, Patient Position: Sitting, Cuff Size: Large)   Pulse 61   SpO2 95% . Client reports he has some of his medications, but they are "mixed up in the med box". RN offered to assist with fixing the med box if client can bring in the pill bottles. Client also reported that he may need refills on some of his medications. He may need a follow up appointment at the heart Failure clinic for refills. He does use the [facility], Rn explained that the pharmacist there would be able to contact his prescriber for refills. Client states he would return to the center on 5/8 for continued RN assistance. Juvenal Opoka BSN, RN  Past Medical History: Past Medical History:  Diagnosis Date   Arrhythmia    atrial fibrillation   Asthma    CHF (congestive heart failure) (HCC)    CKD (chronic kidney disease), stage III (HCC)    Cocaine abuse (HCC)    Diverticulitis    Hypertension    Tobacco use disorder     Encounter Details:  Community Questionnaire - 12/29/23 0950       Questionnaire   Ask client: Do you give verbal consent for me to treat you today? Yes    Student Assistance N/A    Location Patient Served  [facility]    Encounter Setting CN site    Population Status Unknown    Insurance Unknown    Insurance/Financial Assistance Referral N/A    Medication Have Medication Insecurities    Medical Provider Yes   current with [facility] ahd Heart failyure clinic   Screening Referrals Made N/A    Medical Referrals Made N/A    Medical Appointment Completed N/A    CNP Interventions Advocate/Support;Educate    Screenings CN Performed Blood Pressure    ED Visit Averted N/A    Life-Saving Intervention Made N/A

## 2024-01-06 ENCOUNTER — Encounter: Payer: Self-pay | Admitting: Family

## 2024-01-28 ENCOUNTER — Other Ambulatory Visit: Payer: Self-pay

## 2024-02-01 ENCOUNTER — Other Ambulatory Visit: Payer: Self-pay

## 2024-03-02 ENCOUNTER — Other Ambulatory Visit: Payer: Self-pay

## 2024-03-03 ENCOUNTER — Other Ambulatory Visit: Payer: Self-pay

## 2024-03-30 ENCOUNTER — Other Ambulatory Visit: Payer: Self-pay

## 2024-07-25 ENCOUNTER — Other Ambulatory Visit: Payer: Self-pay
